# Patient Record
Sex: Male | Born: 1970 | Race: White | Hispanic: No | Marital: Married | Smoking: Current every day smoker
Health system: Southern US, Community
[De-identification: ages and names within clinical notes are randomized; demographics above are authoritative.]

## PROBLEM LIST (undated history)

## (undated) DIAGNOSIS — R51 Headache: Secondary | ICD-10-CM

## (undated) DIAGNOSIS — E785 Hyperlipidemia, unspecified: Secondary | ICD-10-CM

## (undated) DIAGNOSIS — N200 Calculus of kidney: Secondary | ICD-10-CM

## (undated) DIAGNOSIS — T8753 Necrosis of amputation stump, right lower extremity: Secondary | ICD-10-CM

## (undated) DIAGNOSIS — Z9289 Personal history of other medical treatment: Secondary | ICD-10-CM

## (undated) DIAGNOSIS — J449 Chronic obstructive pulmonary disease, unspecified: Secondary | ICD-10-CM

## (undated) DIAGNOSIS — G546 Phantom limb syndrome with pain: Secondary | ICD-10-CM

## (undated) DIAGNOSIS — E669 Obesity, unspecified: Secondary | ICD-10-CM

## (undated) DIAGNOSIS — G4730 Sleep apnea, unspecified: Secondary | ICD-10-CM

## (undated) DIAGNOSIS — G8929 Other chronic pain: Secondary | ICD-10-CM

## (undated) DIAGNOSIS — D649 Anemia, unspecified: Secondary | ICD-10-CM

## (undated) DIAGNOSIS — R030 Elevated blood-pressure reading, without diagnosis of hypertension: Secondary | ICD-10-CM

## (undated) DIAGNOSIS — T8859XA Other complications of anesthesia, initial encounter: Secondary | ICD-10-CM

## (undated) DIAGNOSIS — Z87448 Personal history of other diseases of urinary system: Secondary | ICD-10-CM

## (undated) DIAGNOSIS — Z8709 Personal history of other diseases of the respiratory system: Secondary | ICD-10-CM

## (undated) DIAGNOSIS — I739 Peripheral vascular disease, unspecified: Secondary | ICD-10-CM

## (undated) DIAGNOSIS — R600 Localized edema: Secondary | ICD-10-CM

## (undated) DIAGNOSIS — Z89519 Acquired absence of unspecified leg below knee: Secondary | ICD-10-CM

## (undated) DIAGNOSIS — Z7901 Long term (current) use of anticoagulants: Secondary | ICD-10-CM

## (undated) DIAGNOSIS — R519 Headache, unspecified: Secondary | ICD-10-CM

## (undated) DIAGNOSIS — M549 Dorsalgia, unspecified: Secondary | ICD-10-CM

## (undated) DIAGNOSIS — F419 Anxiety disorder, unspecified: Secondary | ICD-10-CM

## (undated) DIAGNOSIS — I2699 Other pulmonary embolism without acute cor pulmonale: Secondary | ICD-10-CM

## (undated) DIAGNOSIS — I10 Essential (primary) hypertension: Secondary | ICD-10-CM

## (undated) DIAGNOSIS — T4145XA Adverse effect of unspecified anesthetic, initial encounter: Secondary | ICD-10-CM

## (undated) HISTORY — DX: Obesity, unspecified: E66.9

## (undated) HISTORY — DX: Hyperlipidemia, unspecified: E78.5

## (undated) HISTORY — DX: Localized edema: R60.0

## (undated) HISTORY — PX: APPENDECTOMY: SHX54

## (undated) HISTORY — DX: Essential (primary) hypertension: I10

---

## 1999-01-06 ENCOUNTER — Ambulatory Visit (HOSPITAL_BASED_OUTPATIENT_CLINIC_OR_DEPARTMENT_OTHER): Admission: RE | Admit: 1999-01-06 | Discharge: 1999-01-06 | Payer: Self-pay | Admitting: Orthopedic Surgery

## 1999-01-06 ENCOUNTER — Encounter (INDEPENDENT_AMBULATORY_CARE_PROVIDER_SITE_OTHER): Payer: Self-pay | Admitting: *Deleted

## 2002-04-26 ENCOUNTER — Emergency Department (HOSPITAL_COMMUNITY): Admission: EM | Admit: 2002-04-26 | Discharge: 2002-04-26 | Payer: Self-pay | Admitting: Emergency Medicine

## 2002-04-29 ENCOUNTER — Inpatient Hospital Stay (HOSPITAL_COMMUNITY): Admission: EM | Admit: 2002-04-29 | Discharge: 2002-05-01 | Payer: Self-pay

## 2003-03-14 ENCOUNTER — Emergency Department (HOSPITAL_COMMUNITY): Admission: EM | Admit: 2003-03-14 | Discharge: 2003-03-14 | Payer: Self-pay | Admitting: Emergency Medicine

## 2004-09-29 ENCOUNTER — Emergency Department (HOSPITAL_COMMUNITY): Admission: EM | Admit: 2004-09-29 | Discharge: 2004-09-29 | Payer: Self-pay | Admitting: Emergency Medicine

## 2005-03-16 ENCOUNTER — Ambulatory Visit (HOSPITAL_COMMUNITY): Admission: RE | Admit: 2005-03-16 | Discharge: 2005-03-16 | Payer: Self-pay | Admitting: Internal Medicine

## 2005-06-26 ENCOUNTER — Emergency Department (HOSPITAL_COMMUNITY): Admission: EM | Admit: 2005-06-26 | Discharge: 2005-06-26 | Payer: Self-pay | Admitting: Family Medicine

## 2007-04-03 ENCOUNTER — Ambulatory Visit (HOSPITAL_COMMUNITY): Admission: RE | Admit: 2007-04-03 | Discharge: 2007-04-03 | Payer: Self-pay | Admitting: Internal Medicine

## 2009-05-23 ENCOUNTER — Ambulatory Visit (HOSPITAL_COMMUNITY): Admission: RE | Admit: 2009-05-23 | Discharge: 2009-05-23 | Payer: Self-pay | Admitting: Internal Medicine

## 2010-02-20 ENCOUNTER — Ambulatory Visit
Admission: RE | Admit: 2010-02-20 | Discharge: 2010-02-20 | Payer: Self-pay | Source: Home / Self Care | Attending: Family Medicine | Admitting: Family Medicine

## 2010-06-23 NOTE — Discharge Summary (Signed)
   Dean Bowers, Dean Bowers                          ACCOUNT NO.:  [id number]   MEDICAL RECORD NO.:  [id number]                   PATIENT TYPE:  INP   LOCATION:  0373                                 FACILITY:  [facility]   PHYSICIAN:  Ermalene Searing. Leander Rams, M.D.                DATE OF BIRTH:  06/17/1970   DATE OF ADMISSION:  04/29/2002  DATE OF DISCHARGE:  05/01/2002                                 DISCHARGE SUMMARY   DISCHARGE DIAGNOSES:  1. Infected sebaceous cyst.     a. Status post incision and drainage (04/30/2002).   PAST MEDICAL HISTORY:  None.   PRESENTATION:  The patient is a 40 year old white male previously healthy  who presents with a right posterior chest redness, swelling.  Several days  prior to admission, the patient noticed a small pimple which progressed the  past few days which got bigger and more swollen.  The patient visited the ED  a few days prior to admission, was given Keflex, sent home.  However, new  pustules started to develop and the patient came back to ED.   HOSPITAL COURSE:  INFECTED SEBACEOUS CYST.  On admission, the patient was  noted to have a right posterior chest 10 x 7 cm red, swollen, tender lesion  with four pustules in linear fashion of which three were intact and one was  ruptured.  The impression was cellulitis versus abscess.  Ultrasound was  done and a small amount of pus was aspirated but it was not adequate.  The  next day, a surgery consult was obtained for incision and drainage.  During  the procedure, two sebaceous cysts of golf ball size were found.  These were  removed and the lesions were packed and left open.   CONDITION AT DISCHARGE:  Improved.   DISPOSITION:  To home.   MEDICATION AT DISCHARGE:  Keflex 500 mg p.o. three times daily for 5 days.   WOUND CARE:  The patient was given instruction to change right back dressing  two to three times a day, soak the wound in bathtub and clean with soap and  water and pack it with a saline  gauze.                                               Ermalene Searing. Leander Rams, M.D.    MSR/MEDQ  D:  05/01/2002  T:  05/01/2002  Job:  [id number]   cc:   [facility]

## 2010-06-23 NOTE — Op Note (Signed)
   Dean Bowers, Dean Bowers                          ACCOUNT NO.:  [id number]   MEDICAL RECORD NO.:  [id number]                   PATIENT TYPE:  INP   LOCATION:  0373                                 FACILITY:  [facility]   PHYSICIAN:  Sandria Bales. Ezzard Standing, M.D.               DATE OF BIRTH:  13-Aug-1970   DATE OF PROCEDURE:  04/30/2002  DATE OF DISCHARGE:                                 OPERATIVE REPORT   PREOPERATIVE DIAGNOSIS:  Infection of right back.   POSTOPERATIVE DIAGNOSIS:  Two adjacent infected sebaceous cysts of the right  back.   PROCEDURE:  Incision and drainage of cysts at the bedside.   SURGEON:  Sandria Bales. Ezzard Standing, M.D.   ANESTHESIA:  20 cc 1% Xylocaine.   COMPLICATIONS:  None.   INDICATIONS FOR PROCEDURE:  The patient is a 40 year old obese, tattooed  male, who comes in to see Dr. Leonides Sake at the [facility].  He  comes in with an infection of his right back and is admitted by Dr. Halford Chessman.  Dr. Leander Rams asked me to see him for evaluation of this infection, and I  thought it needed to be incised and drained.   DESCRIPTION OF PROCEDURE:  The patient was in his bed on the Floor.  I  painted the area with Betadine solution.  Infiltrated about  20 cc of 1% Xylocaine.  I made a linear incision basically connecting the  dots where the punctums had come out, but what I broke into were two  sebaceous cysts.  One was probably 3-4 cm in diameter that was more medial  and posterior, and there was about a 2 cm sebaceous cyst more lateral.  I  got sebacea out of both of these, and I think this is probably the origin of  these infections.   I packed it with gauze and dressed it with 4x4's.   DISPOSITION:  The patient will be kept overnight and then plan to discharge  tomorrow.  Will be discharged on oral antibiotics with plans for dressing  changes.  He is to see [place] back in the office in 5-7 days.                                               Sandria Bales. Ezzard Standing, M.D.    DHN/MEDQ  D:  04/30/2002  T:  05/01/2002  Job:  [id number]   cc:   Ermalene Searing. Leander Rams, M.D.  [address], [place] [address]  Fax: [phone]

## 2010-06-23 NOTE — Discharge Summary (Signed)
   Dean Bowers, Dean Bowers                          ACCOUNT NO.:  [id number]   MEDICAL RECORD NO.:  [id number]                   PATIENT TYPE:  INP   LOCATION:  0373                                 FACILITY:  [facility]   PHYSICIAN:  Ermalene Searing. Leander Rams, M.D.                DATE OF BIRTH:  12/21/1970   DATE OF ADMISSION:  04/29/2002  DATE OF DISCHARGE:  05/01/2002                                 DISCHARGE SUMMARY   ADDENDUM:  Arrangements for follow-up care:  The patient will be followed up  by Dr. Ezzard Standing in about a week.                                               Ermalene Searing. Leander Rams, M.D.    MSR/MEDQ  D:  05/01/2002  T:  05/01/2002  Job:  [id number]

## 2010-06-23 NOTE — Consult Note (Signed)
NAMEANSELMO, REIHL                          ACCOUNT NO.:  [id number]   MEDICAL RECORD NO.:  [id number]                   PATIENT TYPE:  INP   LOCATION:  0373                                 FACILITY:  [facility]   PHYSICIAN:  Sandria Bales. Ezzard Standing, M.D.               DATE OF BIRTH:  1970/05/11   DATE OF CONSULTATION:  04/30/2002  DATE OF DISCHARGE:                                   CONSULTATION   HISTORY OF PRESENT ILLNESS:  Mr. Clipper is a 40 year old white male who  comes with an infection of his right back admitted by Dr. Halford Chessman on the  24th who asked me to see him for evaluation of this infection of his back  and possible drainage.   The patient has had skin trouble since a teenager but he is obese, his  weight is about 290 to 295 pounds, he also smokes cigarettes and these are  probably contributing factors. He said he had some swelling of this area  before it got infected.   PAST MEDICAL HISTORY:  He has no allergies and his only medications are  albuterol nebs he takes as needed. He has had only an appendectomy as a  prior operation.   REVIEW OF SYMPTOMS:  PULMONARY:  Smokes cigarettes. He knows this is bad for  his health. He has actually talked about trying to quit. He also has some  mild asthma. CARDIAC:  No history of heart disease, hypertension, chest  pain. GASTROINTESTINAL:  No history of peptic ulcer disease or liver  disease. UROLOGIC:  No history of kidney stones or kidney infections. SKIN:  He is multiply tattooed. He also has piercings of his right nipple, ears,  tongue.   PHYSICAL EXAMINATION:  VITAL SIGNS:  Temperature of 97.2, pulse 91, blood  pressure 134/74.J  GENERAL:  He is a well-nourished, obese male multiply tattooed.  SKIN:  Examination limited to his skin shows an area of inflammation of his  right back where he had some draining sinuses with surrounding cellulitis.  The draining sinuses cover a length of about 6-7 cm in length. The  surrounding  cellulitis is probably 8 or 9 cm in length and about 5 cm in  width.   LABORATORY DATA:  His white blood count is 8700. His hemoglobin 14,  hematocrit 41. __________ 132, potassium 3.6, chloride 102, CO2 28, glucose  120, BUN 11, creatinine of 0.8.   IMPRESSION:  1. Infection, cellulitis, abscess of right back and need for incision and     drainage and will plan to do this later this     evening. I discussed this with the patient.  2. Obesity. He knows his weight is a problem.  3. He has been smoking cigarettes with asthma, he knows he needs to quit     smoking.  Sandria Bales. Ezzard Standing, M.D.    DHN/MEDQ  D:  04/30/2002  T:  04/30/2002  Job:  [id number]   cc:   Ermalene Searing. Leander Rams, M.D.  [address] [place] [address]  Fax: [phone]   Holley Bouche, M.D.  [address]  [place], [place] [address]  Fax: [phone]

## 2010-08-28 ENCOUNTER — Encounter: Payer: Self-pay | Admitting: Medical

## 2010-08-28 ENCOUNTER — Ambulatory Visit (INDEPENDENT_AMBULATORY_CARE_PROVIDER_SITE_OTHER): Payer: PRIVATE HEALTH INSURANCE | Admitting: Medical

## 2010-08-28 VITALS — BP 118/72 | HR 68 | Temp 98.0°F | Ht 70.0 in | Wt 287.0 lb

## 2010-08-28 DIAGNOSIS — H6690 Otitis media, unspecified, unspecified ear: Secondary | ICD-10-CM

## 2010-08-28 DIAGNOSIS — F17200 Nicotine dependence, unspecified, uncomplicated: Secondary | ICD-10-CM

## 2010-08-28 DIAGNOSIS — H60399 Other infective otitis externa, unspecified ear: Secondary | ICD-10-CM

## 2010-08-28 DIAGNOSIS — H6090 Unspecified otitis externa, unspecified ear: Secondary | ICD-10-CM

## 2010-08-28 MED ORDER — NEOMYCIN-POLYMYXIN-HC 1 % OT SOLN: 4.0000 [drp] | Freq: Four times a day (QID) | OTIC | Status: AC

## 2010-08-28 MED ORDER — AMOXICILLIN 875 MG PO TABS: 875.0000 mg | ORAL_TABLET | Freq: Two times a day (BID) | ORAL | Status: AC

## 2010-08-28 NOTE — Progress Notes (Signed)
Subjective:     Dean Bowers is a 40 y.o. male who presents with ear pain and possible ear infection. Symptoms include: right ear drainage , right ear pain and cough, dizziness, pressure in ears.  Onset of symptoms was 4 days ago, and have been gradually worsening since that time. Patient denies: achiness, chills, fever , headache, sinus pressure and sore throat. He is drinking plenty of fluids.  Treatment to date: peroxide in ears.  Denies sick contacts.  No other aggravating or relieving factors.  No other c/o.  The following portions of the patient's history were reviewed and updated as appropriate: allergies, current medications, past family history, past medical history, past social history, past surgical history and problem list.  Past Medical History  Diagnosis Date  . Hypertension   . Dyslipidemia   . Obesity     Review of Systems Constitutional: denies fever, chills, sweats, anorexia Skin: denies rash HEENT: +ear pain, denies hearing loss, sore throat, itchy watery eyes, sneezing Cardiovascular: denies chest pain Lungs: +chest congestion, cough; denies wheezing, SOB Abdomen: denies abdominal pain, nausea, vomiting, diarrhea GU: denies dysuria  Objective:   Filed Vitals:   08/28/10 1421  BP: 118/72  Pulse: 68  Temp: 98 F (36.7 C)    General appearance: Alert, WD/WN, no distress, mildly ill appearing, overweight, white male, strong tobacco odor                             Skin: warm, no rash                           Head: no sinus tenderness                            Eyes: conjunctiva normal, corneas clear, PERRLA                            Ears: left TM erythema and retraction, right ear canal swollen and with some purulent debris and erythema                          Nose: septum midline, turbinates swollen, with erythema and clear discharge             Mouth/throat: MMM, tongue normal, mild pharyngeal erythema                           Neck: supple, no  adenopathy, no thyromegaly, non tender                          Heart: RRR, normal S1, S2, no murmurs                         Lungs: diffuse bronchial sounds, scattered rhonchi, no rales or wheezes    Assessment:   Encounter Diagnoses  Name Primary?  . Otitis externa Yes  . Otitis media   . Tobacco use disorder      Plan:  Otitis externa- cortisporin otic sent  Otitis media - amoxicillin sent, use some OTC Mucinex DM for congestion in ears and chest.  Hydrate well. Call/return if worse or not improving.  Tobacco use disorder- discussed risks of tobacco use.  C/t efforts to stop.  He was intolerant to Chantix.  Wants to c/t efforts on his own without medication.

## 2011-01-04 ENCOUNTER — Ambulatory Visit (INDEPENDENT_AMBULATORY_CARE_PROVIDER_SITE_OTHER): Payer: PRIVATE HEALTH INSURANCE | Admitting: Medical

## 2011-01-04 ENCOUNTER — Encounter: Payer: Self-pay | Admitting: Family Medicine

## 2011-01-04 ENCOUNTER — Encounter: Payer: Self-pay | Admitting: Medical

## 2011-01-04 ENCOUNTER — Ambulatory Visit
Admission: RE | Admit: 2011-01-04 | Discharge: 2011-01-04 | Disposition: A | Discharge status: Home / Self Care | Payer: PRIVATE HEALTH INSURANCE | Source: Ambulatory Visit | Attending: Medical | Admitting: Medical

## 2011-01-04 VITALS — BP 120/80 | HR 72 | Temp 97.9°F | Resp 18 | Wt 288.0 lb

## 2011-01-04 DIAGNOSIS — F17200 Nicotine dependence, unspecified, uncomplicated: Secondary | ICD-10-CM

## 2011-01-04 DIAGNOSIS — S8001XA Contusion of right knee, initial encounter: Secondary | ICD-10-CM

## 2011-01-04 DIAGNOSIS — R062 Wheezing: Secondary | ICD-10-CM

## 2011-01-04 DIAGNOSIS — S299XXA Unspecified injury of thorax, initial encounter: Secondary | ICD-10-CM

## 2011-01-04 DIAGNOSIS — S8000XA Contusion of unspecified knee, initial encounter: Secondary | ICD-10-CM

## 2011-01-04 DIAGNOSIS — S20219A Contusion of unspecified front wall of thorax, initial encounter: Secondary | ICD-10-CM

## 2011-01-04 DIAGNOSIS — S298XXA Other specified injuries of thorax, initial encounter: Secondary | ICD-10-CM

## 2011-01-04 MED ORDER — OXYCODONE-ACETAMINOPHEN 5-325 MG PO TABS: 1.0000 | ORAL_TABLET | Freq: Four times a day (QID) | ORAL | Status: DC | PRN

## 2011-01-04 NOTE — Patient Instructions (Signed)
Rib Contusion A rib contusion (bruise) can occur by a blow to the chest or by a fall against a hard object. Usually these will be much better in a couple weeks. If X-rays were taken today and there are no broken bones (fractures), the diagnosis of bruising is made. However, broken ribs may not show up for several days, or may be discovered later on a routine X-ray when signs of healing show up. If this happens to you, it does not mean that something was missed on the X-ray, but simply that it did not show up on the first X-rays. Earlier diagnosis will not usually change the treatment. HOME CARE INSTRUCTIONS   Avoid strenuous activity. Be careful during activities and avoid bumping the injured ribs. Activities that pull on the injured ribs and cause pain should be avoided, if possible.   For the first day or two, an ice pack used every 20 minutes while awake may be helpful. Put ice in a plastic bag and put a towel between the bag and the skin.   Eat a normal, well-balanced diet. Drink plenty of fluids to avoid constipation.   Take deep breaths several times a day to keep lungs free of infection. Try to cough several times a day. Splint the injured area with a pillow while coughing to ease pain. Coughing can help prevent pneumonia.   Wear a rib belt or binder only if told to do so by your caregiver. If you are wearing a rib belt or binder, you must do the breathing exercises as directed by your caregiver. If not used properly, rib belts or binders restrict breathing which can lead to pneumonia.   Only take over-the-counter or prescription medicines for pain, discomfort, or fever as directed by your caregiver.  SEEK MEDICAL CARE IF:   You or your child has an oral temperature above 102 F (38.9 C).   Your baby is older than 3 months with a rectal temperature of 100.5 F (38.1 C) or higher for more than 1 day.   You develop a cough, with thick or bloody sputum.  SEEK IMMEDIATE MEDICAL CARE IF:     You have difficulty breathing.   You feel sick to your stomach (nausea), have vomiting or belly (abdominal) pain.   You have worsening pain, not controlled with medications, or there is a change in the location of the pain.   You develop sweating or radiation of the pain into the arms, jaw or shoulders, or become light headed or faint.   You or your child has an oral temperature above 102 F (38.9 C), not controlled by medicine.   Your or your baby is older than 3 months with a rectal temperature of 102 F (38.9 C) or higher.   Your baby is 53 months old or younger with a rectal temperature of 100.4 F (38 C) or higher.  MAKE SURE YOU:   Understand these instructions.   Will watch your condition.   Will get help right away if you are not doing well or get worse.  Document Released: 10/17/2000 Document Revised: 10/04/2010 Document Reviewed: 09/10/2007 [facility] Patient Information 2012 [place], [place].
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
## 2011-01-11 ENCOUNTER — Telehealth: Payer: Self-pay | Admitting: Medical

## 2011-01-11 NOTE — Telephone Encounter (Signed)
CALLED PT.LM WITH DR KNAPP'S INSTRUCTIONS. ADVISED TO CALL WITH QUESTIONS.

## 2011-01-11 NOTE — Telephone Encounter (Signed)
His rib pain should be improving by this point, and not needing narcotic pain meds.  I recommend that he be taking anti-inflammatories such as Motrin or Aleve. If he has tried these without relief, he can take the equivalent of prescription strength (as long as he doesn't have history of stomach ulcers or kidney problems), which is 2 Aleve twice a day, taken with food, OR ibuprofen 800 mg (4 of the 200 mg OTC tablets) three times daily with food.  He can also try moist heat.  If this isn't effective in providing pain control, then run it by Vincenza Hews tomorrow to see if he will approve this refill, versus wanting to see him in follow-up.  Thanks

## 2011-01-12 ENCOUNTER — Telehealth: Payer: Self-pay | Admitting: Family Medicine

## 2011-01-12 ENCOUNTER — Other Ambulatory Visit: Payer: Self-pay | Admitting: Medical

## 2011-01-12 MED ORDER — TRAMADOL HCL 50 MG PO TABS: 50.0000 mg | ORAL_TABLET | Freq: Four times a day (QID) | ORAL | Status: DC | PRN

## 2011-01-12 NOTE — Telephone Encounter (Signed)
Patient was notified that his medication was called out to the pharmacy. cls

## 2011-01-12 NOTE — Telephone Encounter (Signed)
Different pain medication sent to the pharmacy, and he should c/t OTC Aleve 1-2 BID pain.  I sent Ultram so no need to call this one out.

## 2011-02-26 ENCOUNTER — Encounter: Payer: Self-pay | Admitting: Family Medicine

## 2011-02-26 ENCOUNTER — Ambulatory Visit (INDEPENDENT_AMBULATORY_CARE_PROVIDER_SITE_OTHER): Payer: PRIVATE HEALTH INSURANCE | Admitting: Family Medicine

## 2011-02-26 VITALS — BP 140/90 | HR 67 | Ht 70.0 in | Wt 296.0 lb

## 2011-02-26 DIAGNOSIS — E1169 Type 2 diabetes mellitus with other specified complication: Secondary | ICD-10-CM | POA: Insufficient documentation

## 2011-02-26 DIAGNOSIS — E7800 Pure hypercholesterolemia, unspecified: Secondary | ICD-10-CM

## 2011-02-26 DIAGNOSIS — E785 Hyperlipidemia, unspecified: Secondary | ICD-10-CM | POA: Insufficient documentation

## 2011-02-26 DIAGNOSIS — F17200 Nicotine dependence, unspecified, uncomplicated: Secondary | ICD-10-CM

## 2011-02-26 DIAGNOSIS — Z0000 Encounter for general adult medical examination without abnormal findings: Secondary | ICD-10-CM

## 2011-02-26 LAB — LIPID PANEL
Cholesterol: 201 mg/dL — ABNORMAL HIGH (ref 0–200)
LDL Cholesterol: 134 mg/dL — ABNORMAL HIGH (ref 0–99)
Total CHOL/HDL Ratio: 4.3 Ratio
VLDL: 20 mg/dL (ref 0–40)

## 2011-02-26 MED ORDER — PRAVASTATIN SODIUM 40 MG PO TABS: 40.0000 mg | ORAL_TABLET | Freq: Every day | ORAL | Status: DC

## 2011-02-26 MED ORDER — ATENOLOL 25 MG PO TABS: 25.0000 mg | ORAL_TABLET | Freq: Every day | ORAL | Status: DC

## 2011-02-26 NOTE — Patient Instructions (Signed)
Continue with her exercise. Let's see what the nutritionist has to say to help you.

## 2011-02-26 NOTE — Progress Notes (Signed)
  Subjective:    Patient ID: Dean Bowers, male    DOB: 01-29-1971, 41 y.o.   MRN: [id number]  HPI He is here for a complete examination. Does continue smoke is not ready to quit. He is exercising regularly. He did lose weight from over 300 to now under 300 pounds but seems to be stable. He recently ran out of his statin medication and did not get it refilled. He does complain of generalized aches and pains mainly in his hips knees and back.   Review of Systems  Constitutional: Negative.   HENT: Negative.   Eyes: Negative.   Respiratory: Negative.   Cardiovascular: Negative.   Gastrointestinal: Negative.   Genitourinary: Negative.   Musculoskeletal: Positive for arthralgias.  Skin: Negative.   Neurological: Negative.   Hematological: Negative.        Objective:   Physical Exam BP 140/90  Pulse 67  Ht 5\' 10"  (1.778 m)  Wt 296 lb (134.265 kg)  BMI 42.47 kg/m2  SpO2 98%  General Appearance:    Alert, cooperative, no distress, appears stated age  Head:    Normocephalic, without obvious abnormality, atraumatic  Eyes:    PERRL, conjunctiva/corneas clear, EOM's intact, fundi    benign  Ears:    Normal TM's and external ear canals  Nose:   Nares normal, mucosa normal, no drainage or sinus   tenderness  Throat:   Lips, mucosa, and tongue normal; teeth and gums normal  Neck:   Supple, no lymphadenopathy;  thyroid:  no   enlargement/tenderness/nodules; no carotid   bruit or JVD  Back:    Spine nontender, no curvature, ROM normal, no CVA     tenderness  Lungs:     Clear to auscultation bilaterally without wheezes, rales or     ronchi; respirations unlabored  Chest Wall:    No tenderness or deformity   Heart:    Regular rate and rhythm, S1 and S2 normal, no murmur, rub   or gallop  Breast Exam:    No chest wall tenderness, masses or gynecomastia  Abdomen:     Soft, non-tender, nondistended, normoactive bowel sounds,    no masses, no hepatosplenomegaly  Genitalia:    Normal male  external genitalia without lesions.  Testicles without masses.  No inguinal hernias.  Rectal:    Normal sphincter tone, no masses or tenderness; guaiac negative stool.  Prostate smooth, no nodules, not enlarged.  Extremities:   No clubbing, cyanosis or edema  Pulses:   2+ and symmetric all extremities  Skin:   Skin color, texture, turgor normal, no rashes or lesions  Lymph nodes:   Cervical, supraclavicular, and axillary nodes normal  Neurologic:   CNII-XII intact, normal strength, sensation and gait; reflexes 2+ and symmetric throughout          Psych:   Normal mood, affect, hygiene and grooming.           Assessment & Plan:   1. Obesity, Class III, BMI 40-49.9 (morbid obesity)  Amb ref to Medical Nutrition Therapy-MNT  2. Hypercholesterolemia  Lipid panel  3. Current smoker    4. Routine general medical examination at a health care facility  POCT Hemoccult (POC) Blood/Stool Test   discussed diet and exercise with him in detail. Explained that he has a lifestyle issue that needs to be dealt with to help prevent further medical concerns. I will refer him to nutritionist. Discussed smoking cessation and told him that when he is ready, we'll help him.

## 2011-03-12 ENCOUNTER — Telehealth: Payer: Self-pay | Admitting: *Deleted

## 2011-03-12 NOTE — Telephone Encounter (Signed)
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

## 2011-03-12 NOTE — Telephone Encounter (Signed)
Patient's wife call and stated that her husband, Dean Bowers had an OV with you 02/26/11 and has lipid done as well. He had been off of his simvastatin(as you stated in your note) for roughly 2.5-3 weeks and his results were: CHOL:201 TRIG:102 HDL:47 LDL:102 He was told lipids were good, do you want him to continue on simvastatin or stop?

## 2011-03-12 NOTE — Telephone Encounter (Signed)
Informed pt wife  to go back on statin

## 2011-05-05 ENCOUNTER — Emergency Department (HOSPITAL_COMMUNITY)
Admission: EM | Admit: 2011-05-05 | Discharge: 2011-05-05 | Disposition: A | Discharge status: Home / Self Care | Payer: PRIVATE HEALTH INSURANCE | Source: Home / Self Care | Attending: Family Medicine | Admitting: Family Medicine

## 2011-05-05 ENCOUNTER — Encounter (HOSPITAL_COMMUNITY): Payer: Self-pay | Admitting: Emergency Medicine

## 2011-05-05 DIAGNOSIS — S61218A Laceration without foreign body of other finger without damage to nail, initial encounter: Secondary | ICD-10-CM

## 2011-05-05 DIAGNOSIS — Z23 Encounter for immunization: Secondary | ICD-10-CM

## 2011-05-05 DIAGNOSIS — S61209A Unspecified open wound of unspecified finger without damage to nail, initial encounter: Secondary | ICD-10-CM

## 2011-05-05 MED ORDER — TETANUS-DIPHTH-ACELL PERTUSSIS 5-2.5-18.5 LF-MCG/0.5 IM SUSP
INTRAMUSCULAR | Status: AC
  Filled 2011-05-05: qty 0.5

## 2011-05-05 MED ORDER — TETANUS-DIPHTH-ACELL PERTUSSIS 5-2.5-18.5 LF-MCG/0.5 IM SUSP
0.5000 mL | Freq: Once | INTRAMUSCULAR | Status: AC
  Administered 2011-05-05: 0.5 mL via INTRAMUSCULAR

## 2011-05-05 NOTE — ED Provider Notes (Signed)
History     CSN: [id number]  Arrival date & time 05/05/11  1412   First MD Initiated Contact with Patient 05/05/11 1453      Chief Complaint  Patient presents with  . Laceration    (Consider location/radiation/quality/duration/timing/severity/associated sxs/prior treatment) Patient is a 41 y.o. male presenting with skin laceration. The history is provided by the patient and the spouse.  Laceration  The incident occurred 6 to 12 hours ago. The laceration is located on the left hand. The laceration is 1 cm in size. Injury mechanism: cut with chain saw. The patient is experiencing no pain. Possible foreign bodies include wood. His tetanus status is out of date.    Past Medical History  Diagnosis Date  . Hypertension   . Obesity   . Dyslipidemia     Past Surgical History  Procedure Date  . Appendectomy     Family History  Problem Relation Age of Onset  . Diabetes Mother   . Arthritis Mother   . Heart disease Father   . Hypertension Father     History  Substance Use Topics  . Smoking status: Current Everyday Smoker -- 1.0 packs/day    Types: Cigarettes  . Smokeless tobacco: Never Used  . Alcohol Use: Yes     15 shots a month      Review of Systems  Constitutional: Negative.   Musculoskeletal: Negative.     Allergies  Review of patient's allergies indicates no known allergies.  Home Medications   Current Outpatient Rx  Name Route Sig Dispense Refill  . ATENOLOL 25 MG PO TABS Oral Take 1 tablet (25 mg total) by mouth daily. 30 tablet 11  . OMEGA-3 FATTY ACIDS 1000 MG PO CAPS Oral Take 2 g by mouth daily.      Marland Kitchen ONE-DAILY MULTI VITAMINS PO TABS Oral Take 1 tablet by mouth daily.      Marland Kitchen PRAVASTATIN SODIUM 40 MG PO TABS Oral Take 1 tablet (40 mg total) by mouth daily. 30 tablet 5    BP 140/81  Pulse 90  Temp(Src) 97.1 F (36.2 C) (Oral)  Resp 20  SpO2 95%  Physical Exam  Nursing note and vitals reviewed. Constitutional: He appears well-developed and  well-nourished.  Skin:       1.5 cm superficial lac over middle phalanx of lif, nl nvt fxn, no bleeding, no tendon exposed, superficial abrasion over dorsum of lmf    ED Course  Procedures (including critical care time)  Labs Reviewed - No data to display No results found.   1. Laceration of finger, index       MDM  Copious irrigation in tap water, betadine soak, xeroform dsd        Linna Hoff, MD 05/05/11 [phone]

## 2011-05-05 NOTE — Discharge Instructions (Signed)
Keep clean and dry and bandaged until Monday, then neosporin ointment twice a day, then wash and bandage as needed. Return if any problems. You had a tetanus booster.

## 2011-05-05 NOTE — ED Notes (Signed)
Patient has lacerations to left index and middle finger.  Currently bleeding controlled.  Patient cut fingers with chain saw this morning

## 2011-05-05 NOTE — ED Notes (Signed)
Patient has been washing hand at sink. Now soaking fingers in betadine and saline.

## 2011-05-05 NOTE — ED Notes (Signed)
Patient not sure when his last tetanus was received

## 2011-06-16 ENCOUNTER — Other Ambulatory Visit: Payer: Self-pay | Admitting: Family Medicine

## 2012-02-09 ENCOUNTER — Other Ambulatory Visit: Payer: Self-pay | Admitting: Family Medicine

## 2012-02-11 ENCOUNTER — Other Ambulatory Visit: Payer: Self-pay

## 2012-05-06 ENCOUNTER — Other Ambulatory Visit: Payer: Self-pay

## 2012-05-06 MED ORDER — PRAVASTATIN SODIUM 40 MG PO TABS: ORAL_TABLET | ORAL | Status: DC

## 2012-05-06 NOTE — Telephone Encounter (Signed)
SENT IN MED  

## 2012-05-13 ENCOUNTER — Telehealth: Payer: Self-pay | Admitting: Internal Medicine

## 2012-05-13 MED ORDER — ATENOLOL 50 MG PO TABS: ORAL_TABLET | ORAL | Status: DC

## 2012-05-13 NOTE — Telephone Encounter (Signed)
Sent in med

## 2012-05-25 ENCOUNTER — Encounter (HOSPITAL_COMMUNITY)
Admission: EM | Disposition: A | Discharge status: Other Acute Inpatient Hospital | Payer: Self-pay | Source: Home / Self Care

## 2012-05-25 ENCOUNTER — Emergency Department (HOSPITAL_COMMUNITY): Payer: 59

## 2012-05-25 ENCOUNTER — Encounter (HOSPITAL_COMMUNITY): Payer: Self-pay

## 2012-05-25 ENCOUNTER — Inpatient Hospital Stay (HOSPITAL_COMMUNITY): Payer: 59

## 2012-05-25 ENCOUNTER — Encounter (HOSPITAL_COMMUNITY): Payer: Self-pay | Admitting: Anesthesiology

## 2012-05-25 ENCOUNTER — Inpatient Hospital Stay (HOSPITAL_COMMUNITY): Payer: 59 | Admitting: Anesthesiology

## 2012-05-25 ENCOUNTER — Inpatient Hospital Stay (HOSPITAL_COMMUNITY)
Admission: EM | Admit: 2012-05-25 | Discharge: 2012-05-30 | DRG: 475 | Disposition: A | Discharge status: Other Acute Inpatient Hospital | Payer: 59 | Attending: General Surgery | Admitting: General Surgery

## 2012-05-25 DIAGNOSIS — G547 Phantom limb syndrome without pain: Secondary | ICD-10-CM | POA: Diagnosis not present

## 2012-05-25 DIAGNOSIS — R339 Retention of urine, unspecified: Secondary | ICD-10-CM | POA: Clinically undetermined

## 2012-05-25 DIAGNOSIS — S8990XA Unspecified injury of unspecified lower leg, initial encounter: Secondary | ICD-10-CM | POA: Diagnosis present

## 2012-05-25 DIAGNOSIS — F17200 Nicotine dependence, unspecified, uncomplicated: Secondary | ICD-10-CM | POA: Diagnosis present

## 2012-05-25 DIAGNOSIS — Z6841 Body Mass Index (BMI) 40.0 and over, adult: Secondary | ICD-10-CM

## 2012-05-25 DIAGNOSIS — E669 Obesity, unspecified: Secondary | ICD-10-CM | POA: Diagnosis present

## 2012-05-25 DIAGNOSIS — S7291XA Unspecified fracture of right femur, initial encounter for closed fracture: Secondary | ICD-10-CM

## 2012-05-25 DIAGNOSIS — J45909 Unspecified asthma, uncomplicated: Secondary | ICD-10-CM | POA: Diagnosis present

## 2012-05-25 DIAGNOSIS — F1210 Cannabis abuse, uncomplicated: Secondary | ICD-10-CM | POA: Diagnosis present

## 2012-05-25 DIAGNOSIS — S22009A Unspecified fracture of unspecified thoracic vertebra, initial encounter for closed fracture: Secondary | ICD-10-CM | POA: Diagnosis present

## 2012-05-25 DIAGNOSIS — S52202A Unspecified fracture of shaft of left ulna, initial encounter for closed fracture: Secondary | ICD-10-CM

## 2012-05-25 DIAGNOSIS — S5290XA Unspecified fracture of unspecified forearm, initial encounter for closed fracture: Secondary | ICD-10-CM | POA: Diagnosis present

## 2012-05-25 DIAGNOSIS — S98911A Complete traumatic amputation of right foot, level unspecified, initial encounter: Secondary | ICD-10-CM

## 2012-05-25 DIAGNOSIS — F1010 Alcohol abuse, uncomplicated: Secondary | ICD-10-CM | POA: Diagnosis present

## 2012-05-25 DIAGNOSIS — S22089A Unspecified fracture of T11-T12 vertebra, initial encounter for closed fracture: Secondary | ICD-10-CM

## 2012-05-25 DIAGNOSIS — S98919A Complete traumatic amputation of unspecified foot, level unspecified, initial encounter: Secondary | ICD-10-CM | POA: Diagnosis present

## 2012-05-25 DIAGNOSIS — D62 Acute posthemorrhagic anemia: Secondary | ICD-10-CM | POA: Diagnosis present

## 2012-05-25 DIAGNOSIS — S72409A Unspecified fracture of lower end of unspecified femur, initial encounter for closed fracture: Principal | ICD-10-CM | POA: Diagnosis present

## 2012-05-25 DIAGNOSIS — S99919A Unspecified injury of unspecified ankle, initial encounter: Secondary | ICD-10-CM | POA: Diagnosis present

## 2012-05-25 DIAGNOSIS — S5292XA Unspecified fracture of left forearm, initial encounter for closed fracture: Secondary | ICD-10-CM

## 2012-05-25 HISTORY — PX: FEMUR IM NAIL: SHX1597

## 2012-05-25 HISTORY — PX: CAST APPLICATION: SHX380

## 2012-05-25 HISTORY — PX: AMPUTATION: SHX166

## 2012-05-25 LAB — COMPREHENSIVE METABOLIC PANEL
AST: 82 U/L — ABNORMAL HIGH (ref 0–37)
Albumin: 3.3 g/dL — ABNORMAL LOW (ref 3.5–5.2)
Alkaline Phosphatase: 52 U/L (ref 39–117)
BUN: 19 mg/dL (ref 6–23)
Potassium: 3.8 mEq/L (ref 3.5–5.1)
Total Protein: 7.1 g/dL (ref 6.0–8.3)

## 2012-05-25 LAB — RAPID URINE DRUG SCREEN, HOSP PERFORMED
Amphetamines: POSITIVE — AB
Barbiturates: NOT DETECTED
Benzodiazepines: NOT DETECTED

## 2012-05-25 LAB — CBC
HCT: 44.7 % (ref 39.0–52.0)
MCV: 84.3 fL (ref 78.0–100.0)
RBC: 5.3 MIL/uL (ref 4.22–5.81)
RDW: 13.3 % (ref 11.5–15.5)
WBC: 12.1 10*3/uL — ABNORMAL HIGH (ref 4.0–10.5)

## 2012-05-25 LAB — URINE MICROSCOPIC-ADD ON

## 2012-05-25 LAB — POCT I-STAT, CHEM 8
BUN: 20 mg/dL (ref 6–23)
Creatinine, Ser: 1.2 mg/dL (ref 0.50–1.35)
Potassium: 3.7 mEq/L (ref 3.5–5.1)
Sodium: 140 mEq/L (ref 135–145)

## 2012-05-25 LAB — URINALYSIS, ROUTINE W REFLEX MICROSCOPIC
Bilirubin Urine: NEGATIVE
Ketones, ur: NEGATIVE mg/dL
Nitrite: NEGATIVE
Protein, ur: 30 mg/dL — AB
Urobilinogen, UA: 0.2 mg/dL (ref 0.0–1.0)

## 2012-05-25 LAB — ETHANOL: Alcohol, Ethyl (B): 15 mg/dL — ABNORMAL HIGH (ref 0–11)

## 2012-05-25 SURGERY — AMPUTATION BELOW KNEE
Anesthesia: General | Site: Leg Upper | Laterality: Right | Wound class: Dirty or Infected

## 2012-05-25 MED ORDER — IOHEXOL 300 MG/ML  SOLN
100.0000 mL | Freq: Once | INTRAMUSCULAR | Status: AC | PRN
  Administered 2012-05-25: 100 mL via INTRAVENOUS

## 2012-05-25 MED ORDER — CEFAZOLIN SODIUM-DEXTROSE 2-3 GM-% IV SOLR
INTRAVENOUS | Status: DC | PRN
  Administered 2012-05-25: 2 g via INTRAVENOUS
  Administered 2012-05-27: 1 g via INTRAVENOUS
  Administered 2012-05-27 (×2): 2 g via INTRAVENOUS

## 2012-05-25 MED ORDER — ARTIFICIAL TEARS OP OINT
TOPICAL_OINTMENT | OPHTHALMIC | Status: DC | PRN
  Administered 2012-05-25: 1 via OPHTHALMIC

## 2012-05-25 MED ORDER — LIDOCAINE HCL (CARDIAC) 20 MG/ML IV SOLN
INTRAVENOUS | Status: DC | PRN
  Administered 2012-05-25 (×2): 100 mg via INTRAVENOUS

## 2012-05-25 MED ORDER — NICOTINE 21 MG/24HR TD PT24
21.0000 mg | MEDICATED_PATCH | Freq: Every day | TRANSDERMAL | Status: DC
  Filled 2012-05-25: qty 1

## 2012-05-25 MED ORDER — ALBUTEROL SULFATE HFA 108 (90 BASE) MCG/ACT IN AERS
INHALATION_SPRAY | RESPIRATORY_TRACT | Status: DC | PRN
  Administered 2012-05-25 – 2012-05-26 (×4): 2 via RESPIRATORY_TRACT

## 2012-05-25 MED ORDER — ONDANSETRON HCL 4 MG/2ML IJ SOLN
INTRAMUSCULAR | Status: DC | PRN
  Administered 2012-05-25: 4 mg via INTRAVENOUS

## 2012-05-25 MED ORDER — VECURONIUM BROMIDE 10 MG IV SOLR
INTRAVENOUS | Status: DC | PRN
  Administered 2012-05-25 (×2): 10 mg via INTRAVENOUS

## 2012-05-25 MED ORDER — MORPHINE SULFATE 4 MG/ML IJ SOLN
8.0000 mg | Freq: Once | INTRAMUSCULAR | Status: AC
  Administered 2012-05-25: 8 mg via INTRAVENOUS
  Filled 2012-05-25: qty 2

## 2012-05-25 MED ORDER — 0.9 % SODIUM CHLORIDE (POUR BTL) OPTIME
TOPICAL | Status: DC | PRN
  Administered 2012-05-25: 1000 mL

## 2012-05-25 MED ORDER — SODIUM CHLORIDE 0.9 % IV SOLN
10.0000 mg | INTRAVENOUS | Status: DC | PRN
  Administered 2012-05-25: 25 ug/min via INTRAVENOUS

## 2012-05-25 MED ORDER — MIDAZOLAM HCL 5 MG/5ML IJ SOLN
INTRAMUSCULAR | Status: DC | PRN
  Administered 2012-05-25: 4 mg via INTRAVENOUS
  Administered 2012-05-25: 2 mg via INTRAVENOUS

## 2012-05-25 MED ORDER — FENTANYL CITRATE 0.05 MG/ML IJ SOLN
INTRAMUSCULAR | Status: AC | PRN
  Administered 2012-05-25 (×3): 100 ug via INTRAVENOUS

## 2012-05-25 MED ORDER — LACTATED RINGERS IV SOLN
INTRAVENOUS | Status: DC | PRN
  Administered 2012-05-25 (×4): via INTRAVENOUS

## 2012-05-25 MED ORDER — ALBUMIN HUMAN 5 % IV SOLN
INTRAVENOUS | Status: DC | PRN
  Administered 2012-05-25 (×2): via INTRAVENOUS

## 2012-05-25 MED ORDER — FENTANYL CITRATE 0.05 MG/ML IJ SOLN
INTRAMUSCULAR | Status: AC
  Filled 2012-05-25: qty 2

## 2012-05-25 MED ORDER — SUCCINYLCHOLINE CHLORIDE 20 MG/ML IJ SOLN
INTRAMUSCULAR | Status: DC | PRN
  Administered 2012-05-25: 140 mg via INTRAVENOUS

## 2012-05-25 MED ORDER — PROPOFOL 10 MG/ML IV BOLUS
INTRAVENOUS | Status: DC | PRN
  Administered 2012-05-25: 200 mg via INTRAVENOUS
  Administered 2012-05-25: 50 mg via INTRAVENOUS

## 2012-05-25 MED ORDER — CEFAZOLIN SODIUM-DEXTROSE 2-3 GM-% IV SOLR
2.0000 g | Freq: Once | INTRAVENOUS | Status: AC
  Administered 2012-05-25: 2 g via INTRAVENOUS
  Filled 2012-05-25: qty 50

## 2012-05-25 MED ORDER — FENTANYL CITRATE 0.05 MG/ML IJ SOLN
INTRAMUSCULAR | Status: DC | PRN
  Administered 2012-05-25: 100 ug via INTRAVENOUS
  Administered 2012-05-25: 50 ug via INTRAVENOUS
  Administered 2012-05-25: 150 ug via INTRAVENOUS
  Administered 2012-05-25 (×2): 50 ug via INTRAVENOUS
  Administered 2012-05-25: 100 ug via INTRAVENOUS
  Administered 2012-05-25: 50 ug via INTRAVENOUS
  Administered 2012-05-25: 100 ug via INTRAVENOUS
  Administered 2012-05-25 – 2012-05-26 (×3): 50 ug via INTRAVENOUS

## 2012-05-25 MED ORDER — TETANUS-DIPHTH-ACELL PERTUSSIS 5-2.5-18.5 LF-MCG/0.5 IM SUSP
0.5000 mL | Freq: Once | INTRAMUSCULAR | Status: AC
  Administered 2012-05-25: 0.5 mL via INTRAMUSCULAR
  Filled 2012-05-25: qty 0.5

## 2012-05-25 SURGICAL SUPPLY — 79 items
BANDAGE ELASTIC 3 VELCRO ST LF (GAUZE/BANDAGES/DRESSINGS) ×2 IMPLANT
BANDAGE ELASTIC 4 VELCRO ST LF (GAUZE/BANDAGES/DRESSINGS) ×2 IMPLANT
BANDAGE ELASTIC 6 VELCRO ST LF (GAUZE/BANDAGES/DRESSINGS) ×3 IMPLANT
BANDAGE GAUZE ELAST BULKY 4 IN (GAUZE/BANDAGES/DRESSINGS) ×2 IMPLANT
BIT DRILL CALIBRATED 4.3MMX365 (DRILL) ×1 IMPLANT
BIT DRILL CROWE PNT TWST 4.5MM (DRILL) ×1 IMPLANT
BLADE SAW RECIP 87.9 MT (BLADE) ×2 IMPLANT
BLADE SAW SAG HD 89.5X25X.94 (BLADE) ×2 IMPLANT
BLADE SURG 10 STRL SS (BLADE) ×6 IMPLANT
BLADE SURG 15 STRL LF DISP TIS (BLADE) ×2 IMPLANT
BLADE SURG 15 STRL SS (BLADE) ×10
CANISTER WOUND CARE 500ML ATS (WOUND CARE) ×2 IMPLANT
CLOTH BEACON ORANGE TIMEOUT ST (SAFETY) ×5 IMPLANT
COVER SURGICAL LIGHT HANDLE (MISCELLANEOUS) ×5 IMPLANT
CUFF TOURNIQUET SINGLE 34IN LL (TOURNIQUET CUFF) ×2 IMPLANT
CUFF TOURNIQUET SINGLE 44IN (TOURNIQUET CUFF) IMPLANT
DRAPE C-ARM 42X72 X-RAY (DRAPES) ×2 IMPLANT
DRAPE C-ARMOR (DRAPES) ×2 IMPLANT
DRAPE EXTREMITY T 121X128X90 (DRAPE) ×3 IMPLANT
DRAPE INCISE IOBAN 66X45 STRL (DRAPES) ×2 IMPLANT
DRAPE ORTHO SPLIT 77X108 STRL (DRAPES) ×5
DRAPE PROXIMA HALF (DRAPES) ×4 IMPLANT
DRAPE SURG ORHT 6 SPLT 77X108 (DRAPES) ×1 IMPLANT
DRAPE U-SHAPE 47X51 STRL (DRAPES) ×2 IMPLANT
DRILL CALIBRATED 4.3MMX365 (DRILL) ×5
DRILL CROWE POINT TWIST 4.5MM (DRILL) ×5
DRSG MEPILEX BORDER 4X4 (GAUZE/BANDAGES/DRESSINGS) ×2 IMPLANT
DRSG PAD ABDOMINAL 8X10 ST (GAUZE/BANDAGES/DRESSINGS) ×2 IMPLANT
DRSG VAC ATS MED SENSATRAC (GAUZE/BANDAGES/DRESSINGS) ×2 IMPLANT
DURAPREP 26ML APPLICATOR (WOUND CARE) ×7 IMPLANT
GAUZE XEROFORM 5X9 LF (GAUZE/BANDAGES/DRESSINGS) ×7 IMPLANT
GLOVE BIOGEL PI IND STRL 7.0 (GLOVE) ×2 IMPLANT
GLOVE BIOGEL PI IND STRL 7.5 (GLOVE) ×5 IMPLANT
GLOVE BIOGEL PI IND STRL 8 (GLOVE) ×8 IMPLANT
GLOVE BIOGEL PI INDICATOR 7.0 (GLOVE) ×2
GLOVE BIOGEL PI INDICATOR 7.5 (GLOVE) ×2
GLOVE BIOGEL PI INDICATOR 8 (GLOVE) ×2
GLOVE ECLIPSE 8.0 STRL XLNG CF (GLOVE) ×2 IMPLANT
GLOVE ORTHO TXT STRL SZ7.5 (GLOVE) ×9 IMPLANT
GLOVE SURG ORTHO 8.0 STRL STRW (GLOVE) ×9 IMPLANT
GOWN STRL NON-REIN LRG LVL3 (GOWN DISPOSABLE) ×8 IMPLANT
GOWN STRL REIN 3XL LVL4 (GOWN DISPOSABLE) ×4 IMPLANT
GUIDEPIN 3.2X17.5 THRD DISP (PIN) ×2 IMPLANT
GUIDEWIRE BEAD TIP (WIRE) ×2 IMPLANT
IMMOBILIZER KNEE 20 (SOFTGOODS) ×5
IMMOBILIZER KNEE 20 THIGH 36 (SOFTGOODS) ×1 IMPLANT
KIT BASIN OR (CUSTOM PROCEDURE TRAY) ×5 IMPLANT
KIT ROOM TURNOVER OR (KITS) ×5 IMPLANT
MANIFOLD NEPTUNE II (INSTRUMENTS) ×5 IMPLANT
NAIL FEM RETRO 12X360 (Nail) ×2 IMPLANT
NS IRRIG 1000ML POUR BTL (IV SOLUTION) ×7 IMPLANT
PACK GENERAL/GYN (CUSTOM PROCEDURE TRAY) ×3 IMPLANT
PAD ARMBOARD 7.5X6 YLW CONV (MISCELLANEOUS) ×10 IMPLANT
PAD CAST 4YDX4 CTTN HI CHSV (CAST SUPPLIES) ×7 IMPLANT
PADDING CAST COTTON 4X4 STRL (CAST SUPPLIES) ×20
SCREW CORT TI DBL LEAD 5X32 (Screw) ×2 IMPLANT
SCREW CORT TI DBL LEAD 5X40 (Screw) ×2 IMPLANT
SCREW CORT TI DBL LEAD 5X65 (Screw) ×2 IMPLANT
SCREW CORT TI DBL LEAD 5X85 (Screw) ×2 IMPLANT
SOLUTION BETADINE 4OZ (MISCELLANEOUS) ×5 IMPLANT
SPLINT PLASTER CAST XFAST 4X15 (CAST SUPPLIES) ×1 IMPLANT
SPLINT PLASTER XTRA FAST SET 4 (CAST SUPPLIES) ×1
SPONGE GAUZE 4X4 12PLY (GAUZE/BANDAGES/DRESSINGS) ×5 IMPLANT
SPONGE LAP 18X18 X RAY DECT (DISPOSABLE) ×2 IMPLANT
SPONGE SCRUB IODOPHOR (GAUZE/BANDAGES/DRESSINGS) ×5 IMPLANT
STAPLER VISISTAT 35W (STAPLE) ×5 IMPLANT
STOCKINETTE IMPERVIOUS LG (DRAPES) ×2 IMPLANT
SUT ETHILON 2 0 PSLX (SUTURE) ×2 IMPLANT
SUT SILK 2 0 SH CR/8 (SUTURE) ×2 IMPLANT
SUT VIC AB 0 CTB1 27 (SUTURE) ×3 IMPLANT
SUT VIC AB 1 CT1 27 (SUTURE) ×10
SUT VIC AB 1 CT1 27XBRD ANBCTR (SUTURE) ×2 IMPLANT
SUT VIC AB 1 CTB1 27 (SUTURE) ×6 IMPLANT
SUT VIC AB 2-0 CTB1 (SUTURE) ×11 IMPLANT
SYR BULB IRRIGATION 50ML (SYRINGE) ×2 IMPLANT
TOWEL OR 17X24 6PK STRL BLUE (TOWEL DISPOSABLE) ×9 IMPLANT
TOWEL OR 17X26 10 PK STRL BLUE (TOWEL DISPOSABLE) ×11 IMPLANT
UNDERPAD 30X30 INCONTINENT (UNDERPADS AND DIAPERS) ×5 IMPLANT
WATER STERILE IRR 1000ML POUR (IV SOLUTION) ×3 IMPLANT

## 2012-05-25 NOTE — ED Notes (Signed)
Splint placed to left arm by Ortho tech
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
## 2012-05-25 NOTE — Anesthesia Preprocedure Evaluation (Addendum)
Anesthesia Evaluation  Patient identified by MRN, date of birth, ID band Patient awake    Reviewed: Allergy & Precautions  History of Anesthesia Complications Negative for: history of anesthetic complications  Airway Mallampati: II  Neck ROM: Full    Dental  (+) Teeth Intact and Dental Advisory Given   Pulmonary asthma , Current Smoker,    Pulmonary exam normal       Cardiovascular hypertension, Pt. 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  Reproductive/Obstetrics                       Anesthesia Physical Anesthesia Plan  ASA: III and emergent  Anesthesia Plan: General   Post-op Pain Management:    Induction: Intravenous, Rapid sequence and Cricoid pressure planned  Airway Management Planned: Oral ETT and Video Laryngoscope Planned  Additional Equipment:   Intra-op Plan:   Post-operative Plan: Extubation in OR  Informed Consent: I have reviewed the patients History and Physical, chart, labs and discussed the procedure including the risks, benefits and alternatives for the proposed anesthesia with the patient or authorized representative who has indicated his/her understanding and acceptance.   Dental advisory given  Plan Discussed with: CRNA, Anesthesiologist and Surgeon  Anesthesia Plan Comments: (In cervical collar for C6 fx)       Anesthesia Quick Evaluation

## 2012-05-25 NOTE — Progress Notes (Signed)
Orthopedic Tech Progress Note Patient Details:  Dean Bowers [facility] 05-05-1970 [id number]  Ortho Devices Type of Ortho Device: Ace wrap;Sugartong splint;Post (long arm) splint;Arm sling Ortho Device/Splint Location: LUE Ortho Device/Splint Interventions: Ordered;Application   Jennye Moccasin 05/25/2012, 5:40 PM

## 2012-05-25 NOTE — Anesthesia Procedure Notes (Signed)
Procedure Name: Intubation Date/Time: 05/25/2012 9:06 PM Performed by: Wray Kearns A Pre-anesthesia Checklist: Patient identified, Timeout performed, Emergency Drugs available, Suction available and Patient being monitored Patient Re-evaluated:Patient Re-evaluated prior to inductionOxygen Delivery Method: Circle system utilized Intubation Type: IV induction, Cricoid Pressure applied and Rapid sequence Laryngoscope Size: Mac and 4 Grade View: Grade I Tube type: Subglottic suction tube Tube size: 8.5 mm Number of attempts: 1 Airway Equipment and Method: Patient positioned with wedge pillow and Bougie stylet Placement Confirmation: ETT inserted through vocal cords under direct vision,  breath sounds checked- equal and bilateral and positive ETCO2 Secured at: 24 cm Tube secured with: Tape Dental Injury: Teeth and Oropharynx as per pre-operative assessment
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
## 2012-05-25 NOTE — ED Notes (Signed)
Family at the bedside prior to transport to CT

## 2012-05-25 NOTE — Preoperative (Addendum)
Beta Blockers   Reason not to administer Beta Blockers:taken Fri, 4/18
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
## 2012-05-25 NOTE — ED Notes (Signed)
Pt to CT with this RN and Dr. Donell Beers
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
## 2012-05-25 NOTE — ED Notes (Signed)
Dr. Charlann Boxer (ortho) at bedside. Ok to transport patient to OR holding after portable xray's are completed

## 2012-05-26 ENCOUNTER — Inpatient Hospital Stay (HOSPITAL_COMMUNITY): Payer: 59

## 2012-05-26 ENCOUNTER — Other Ambulatory Visit: Payer: Self-pay | Admitting: Neurosurgery

## 2012-05-26 ENCOUNTER — Encounter (HOSPITAL_COMMUNITY): Payer: Self-pay | Admitting: Neurology

## 2012-05-26 DIAGNOSIS — S88119A Complete traumatic amputation at level between knee and ankle, unspecified lower leg, initial encounter: Secondary | ICD-10-CM

## 2012-05-26 DIAGNOSIS — S5290XA Unspecified fracture of unspecified forearm, initial encounter for closed fracture: Secondary | ICD-10-CM

## 2012-05-26 DIAGNOSIS — S22009A Unspecified fracture of unspecified thoracic vertebra, initial encounter for closed fracture: Secondary | ICD-10-CM

## 2012-05-26 DIAGNOSIS — S98911A Complete traumatic amputation of right foot, level unspecified, initial encounter: Secondary | ICD-10-CM

## 2012-05-26 DIAGNOSIS — S7290XA Unspecified fracture of unspecified femur, initial encounter for closed fracture: Secondary | ICD-10-CM

## 2012-05-26 DIAGNOSIS — S7291XA Unspecified fracture of right femur, initial encounter for closed fracture: Secondary | ICD-10-CM

## 2012-05-26 DIAGNOSIS — D62 Acute posthemorrhagic anemia: Secondary | ICD-10-CM

## 2012-05-26 LAB — BASIC METABOLIC PANEL
CO2: 26 mEq/L (ref 19–32)
Calcium: 7.5 mg/dL — ABNORMAL LOW (ref 8.4–10.5)
Glucose, Bld: 192 mg/dL — ABNORMAL HIGH (ref 70–99)
Sodium: 137 mEq/L (ref 135–145)

## 2012-05-26 LAB — CBC
Hemoglobin: 9.4 g/dL — ABNORMAL LOW (ref 13.0–17.0)
MCH: 29.1 pg (ref 26.0–34.0)
MCV: 83.6 fL (ref 78.0–100.0)
RBC: 3.23 MIL/uL — ABNORMAL LOW (ref 4.22–5.81)

## 2012-05-26 LAB — CDS SEROLOGY

## 2012-05-26 MED ORDER — DIPHENHYDRAMINE HCL 12.5 MG/5ML PO ELIX
12.5000 mg | ORAL_SOLUTION | Freq: Four times a day (QID) | ORAL | Status: DC | PRN
  Filled 2012-05-26: qty 5

## 2012-05-26 MED ORDER — ONDANSETRON HCL 4 MG/2ML IJ SOLN: 4.0000 mg | Freq: Four times a day (QID) | INTRAMUSCULAR | Status: DC | PRN

## 2012-05-26 MED ORDER — METOCLOPRAMIDE HCL 5 MG PO TABS
5.0000 mg | ORAL_TABLET | Freq: Three times a day (TID) | ORAL | Status: DC | PRN
  Filled 2012-05-26: qty 2

## 2012-05-26 MED ORDER — KCL IN DEXTROSE-NACL 20-5-0.45 MEQ/L-%-% IV SOLN
INTRAVENOUS | Status: AC
  Filled 2012-05-26: qty 1000

## 2012-05-26 MED ORDER — PREGABALIN 50 MG PO CAPS
75.0000 mg | ORAL_CAPSULE | Freq: Two times a day (BID) | ORAL | Status: DC
  Filled 2012-05-26: qty 1

## 2012-05-26 MED ORDER — KCL IN DEXTROSE-NACL 20-5-0.45 MEQ/L-%-% IV SOLN
INTRAVENOUS | Status: DC
  Administered 2012-05-26 – 2012-05-28 (×6): via INTRAVENOUS
  Administered 2012-05-29: 1000 mL via INTRAVENOUS
  Filled 2012-05-26 (×12): qty 1000

## 2012-05-26 MED ORDER — PHENOL 1.4 % MT LIQD: 1.0000 | OROMUCOSAL | Status: DC | PRN

## 2012-05-26 MED ORDER — ATENOLOL 50 MG PO TABS
50.0000 mg | ORAL_TABLET | Freq: Every day | ORAL | Status: DC
  Administered 2012-05-26: 50 mg via ORAL
  Filled 2012-05-26 (×2): qty 1

## 2012-05-26 MED ORDER — METOCLOPRAMIDE HCL 5 MG/ML IJ SOLN
5.0000 mg | Freq: Three times a day (TID) | INTRAMUSCULAR | Status: DC | PRN
  Filled 2012-05-26: qty 2

## 2012-05-26 MED ORDER — METHOCARBAMOL 100 MG/ML IJ SOLN
500.0000 mg | Freq: Four times a day (QID) | INTRAVENOUS | Status: DC | PRN
  Filled 2012-05-26: qty 5

## 2012-05-26 MED ORDER — MENTHOL 3 MG MT LOZG: 1.0000 | LOZENGE | OROMUCOSAL | Status: DC | PRN

## 2012-05-26 MED ORDER — MORPHINE SULFATE (PF) 1 MG/ML IV SOLN
INTRAVENOUS | Status: AC
  Filled 2012-05-26: qty 25

## 2012-05-26 MED ORDER — ONDANSETRON HCL 4 MG PO TABS: 4.0000 mg | ORAL_TABLET | Freq: Four times a day (QID) | ORAL | Status: DC | PRN

## 2012-05-26 MED ORDER — SODIUM CHLORIDE 0.9 % IJ SOLN: 9.0000 mL | INTRAMUSCULAR | Status: DC | PRN

## 2012-05-26 MED ORDER — NEOSTIGMINE METHYLSULFATE 1 MG/ML IJ SOLN
INTRAMUSCULAR | Status: DC | PRN
  Administered 2012-05-26: 5 mg via INTRAVENOUS

## 2012-05-26 MED ORDER — HYDROMORPHONE HCL PF 1 MG/ML IJ SOLN
0.2500 mg | INTRAMUSCULAR | Status: DC | PRN
  Administered 2012-05-26: 0.5 mg via INTRAVENOUS

## 2012-05-26 MED ORDER — CEFAZOLIN SODIUM-DEXTROSE 2-3 GM-% IV SOLR
2.0000 g | Freq: Four times a day (QID) | INTRAVENOUS | Status: AC
  Administered 2012-05-26 (×2): 2 g via INTRAVENOUS
  Filled 2012-05-26 (×2): qty 50

## 2012-05-26 MED ORDER — MORPHINE SULFATE (PF) 1 MG/ML IV SOLN
INTRAVENOUS | Status: DC
  Administered 2012-05-26 (×3): via INTRAVENOUS
  Administered 2012-05-26: 29.97 mg via INTRAVENOUS
  Administered 2012-05-26: 12 mg via INTRAVENOUS
  Administered 2012-05-26: 25.5 mg via INTRAVENOUS
  Administered 2012-05-26: 24 mg via INTRAVENOUS
  Administered 2012-05-26: 02:00:00 via INTRAVENOUS
  Administered 2012-05-27: 10.96 mg via INTRAVENOUS
  Filled 2012-05-26 (×4): qty 25

## 2012-05-26 MED ORDER — HYDROMORPHONE HCL PF 1 MG/ML IJ SOLN
INTRAMUSCULAR | Status: AC
  Filled 2012-05-26: qty 1

## 2012-05-26 MED ORDER — DOCUSATE SODIUM 100 MG PO CAPS
100.0000 mg | ORAL_CAPSULE | Freq: Two times a day (BID) | ORAL | Status: DC
  Administered 2012-05-26: 100 mg via ORAL
  Filled 2012-05-26 (×4): qty 1

## 2012-05-26 MED ORDER — CEFAZOLIN SODIUM 1-5 GM-% IV SOLN
1.0000 g | Freq: Three times a day (TID) | INTRAVENOUS | Status: DC
  Administered 2012-05-26 – 2012-05-27 (×3): 1 g via INTRAVENOUS
  Filled 2012-05-26 (×6): qty 50

## 2012-05-26 MED ORDER — OXYCODONE HCL 5 MG PO TABS: 5.0000 mg | ORAL_TABLET | Freq: Once | ORAL | Status: DC | PRN

## 2012-05-26 MED ORDER — ONDANSETRON HCL 4 MG/2ML IJ SOLN
4.0000 mg | Freq: Four times a day (QID) | INTRAMUSCULAR | Status: DC | PRN
  Administered 2012-05-30: 4 mg via INTRAVENOUS
  Filled 2012-05-26: qty 2

## 2012-05-26 MED ORDER — PROMETHAZINE HCL 25 MG/ML IJ SOLN: 6.2500 mg | INTRAMUSCULAR | Status: DC | PRN

## 2012-05-26 MED ORDER — MORPHINE SULFATE 2 MG/ML IJ SOLN: 1.0000 mg | INTRAMUSCULAR | Status: DC | PRN

## 2012-05-26 MED ORDER — METHOCARBAMOL 500 MG PO TABS
500.0000 mg | ORAL_TABLET | Freq: Four times a day (QID) | ORAL | Status: DC | PRN
  Filled 2012-05-26: qty 1

## 2012-05-26 MED ORDER — GLYCOPYRROLATE 0.2 MG/ML IJ SOLN
INTRAMUSCULAR | Status: DC | PRN
  Administered 2012-05-26: 0.6 mg via INTRAVENOUS

## 2012-05-26 MED ORDER — NALOXONE HCL 0.4 MG/ML IJ SOLN: 0.4000 mg | INTRAMUSCULAR | Status: DC | PRN

## 2012-05-26 MED ORDER — SIMVASTATIN 20 MG PO TABS
20.0000 mg | ORAL_TABLET | Freq: Every day | ORAL | Status: DC
  Administered 2012-05-26: 20 mg via ORAL
  Filled 2012-05-26 (×2): qty 1

## 2012-05-26 MED ORDER — LORAZEPAM 0.5 MG PO TABS
0.5000 mg | ORAL_TABLET | ORAL | Status: DC | PRN
  Administered 2012-05-26: 0.5 mg via ORAL
  Filled 2012-05-26: qty 1

## 2012-05-26 MED ORDER — DIPHENHYDRAMINE HCL 50 MG/ML IJ SOLN
12.5000 mg | Freq: Four times a day (QID) | INTRAMUSCULAR | Status: DC | PRN
  Administered 2012-05-26: 12.5 mg via INTRAVENOUS
  Filled 2012-05-26: qty 1

## 2012-05-26 MED ORDER — ALUM & MAG HYDROXIDE-SIMETH 200-200-20 MG/5ML PO SUSP: 30.0000 mL | ORAL | Status: DC | PRN

## 2012-05-26 MED ORDER — OXYCODONE HCL 5 MG/5ML PO SOLN: 5.0000 mg | Freq: Once | ORAL | Status: DC | PRN

## 2012-05-26 MED ORDER — CEFAZOLIN SODIUM 1-5 GM-% IV SOLN
1.0000 g | Freq: Three times a day (TID) | INTRAVENOUS | Status: DC
  Filled 2012-05-26 (×2): qty 50
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
## 2012-05-26 NOTE — Anesthesia Postprocedure Evaluation (Signed)
Anesthesia Post Note  Patient: Dean Bowers [facility]  Procedure(s) Performed: Procedure(s) (LRB): AMPUTATION BELOW KNEE (Right) INTRAMEDULLARY (IM) NAIL FEMORAL  (Right) CAST APPLICATION (Left)  Anesthesia type: general  Patient location: PACU  Post pain: Pain level controlled  Post assessment: Patient's Cardiovascular Status Stable  Last Vitals:  Filed Vitals:   05/26/12 0200  BP: 107/61  Pulse: 125  Temp:   Resp: 27    Post vital signs: Reviewed and stable  Level of consciousness: sedated  Complications: No apparent anesthesia complications
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
## 2012-05-26 NOTE — Progress Notes (Signed)
UR completed 

## 2012-05-26 NOTE — Brief Op Note (Signed)
05/25/2012  1:30 AM  PATIENT:  Dean Bowers  42 y.o. male  PRE-OPERATIVE DIAGNOSIS:  1.  Closed left segmental both bone forearm fracture.  2.  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  3.  Right closed distal one third diaphyseal femur fracture.  POST-OPERATIVE DIAGNOSIS: 1.  Closed left segmental both bone forearm fracture.  2.  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  3.  Right closed distal one third diaphyseal femur fracture.  PROCEDURE:  Procedure(s) with comments: 1.  Closed reduction application of long arm splint left both bone forearm fracture 2.  ORIF right distal femur fracture - retrograde intramedullary nail 3.  Guillotine below knee amputation of right leg    SURGEON:  Surgeon(s) and Role: Panel 1:    * Shelda Pal, MD - Primary  Panel 2:    * Shelda Pal, MD - Primary  Panel 3:    * Shelda Pal, MD - Primary  PHYSICIAN ASSISTANT: Lanney Gins, PA-C  ANESTHESIA:   general  EBL:  Total I/O In: 4500 [I.V.:4000; IV Piggyback:500] Out: 1500 [Urine:950; Blood:550]  BLOOD ADMINISTERED:none  DRAINS: none   LOCAL MEDICATIONS USED:  NONE  SPECIMEN:  No Specimen  DISPOSITION OF SPECIMEN:  N/A  COUNTS:  YES  TOURNIQUET:  * No tourniquets in log *  DICTATION: .Other Dictation: Dictation Number [id number]  PLAN OF CARE: Admit to inpatient   PATIENT DISPOSITION:  PACU - hemodynamically stable.   Delay start of Pharmacological VTE agent (>24hrs) due to surgical blood loss or risk of bleeding: yes

## 2012-05-26 NOTE — Consult Note (Signed)
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 likely take 4 or 5 hours in total for the left upper extremity and right lower extremity   Per Dr. Rush Farmer note please follow spine precautions and log roll to move due to T12 fracture   6. ID  Continue Ancef for open wound to the right leg 7. Phantom pain right leg  Initiate neuropathic agent for pain control as well  8. DVT and PE prophylaxis  SCDs  Will eventually need some pharmacologic agent after or so and neurosurgery procedures complete given immobility as well as magnitude of his injury  9. FEN  N.p.o. after midnight   10. Polysubstance abuse  tox screen positive for marijuana, methamphetamine and opiods (likely from narcotics received enroute to hospital)  11. Disposition  OR tomorrow for repeat I&D of right lower leg, ORIF left radius and ulna  check additional x-rays the right tibia to evaluate the amount of bone that remains viable as well as an x-ray of the left forearm   Mearl Latin, PA-C [facility] [phone] (P) 05/26/2012, 9:10 AM
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
## 2012-05-26 NOTE — Progress Notes (Signed)
Patient examined and I agree with the assessment and plan I also spoke to his wife, grandparents , and brother at the bedside to update the plan of care.  I D/W Dr. Venetia Maxon as well and plan is to try to coordinate OR for tomorrow PM NS and ortho trauma. Violeta Gelinas, MD, MPH, FACS Pager: [phone]  05/26/2012 11:27 AM

## 2012-05-26 NOTE — Progress Notes (Signed)
Patient ID: Dean Bowers, male   DOB: 10/02/1970, 42 y.o.   MRN: [id number]  Alert, conversant, w/o c/o pain at present. Family in room.  Plan for tomorrow d/w pt & family: am surgeries by Dr. Carola Frost, followed by repositioning & Dr. Fredrich Birks stabilization of the spinal fracture.  Pt & family verbalize understanding that pt will remain under anesthesia for all surgeries.  Pt agrees with plan & has no questions or concerns at present.  Georgiann Cocker, RN, BSN

## 2012-05-27 ENCOUNTER — Inpatient Hospital Stay (HOSPITAL_COMMUNITY): Payer: 59

## 2012-05-27 ENCOUNTER — Encounter (HOSPITAL_COMMUNITY)
Admission: EM | Disposition: A | Discharge status: Other Acute Inpatient Hospital | Payer: Self-pay | Source: Home / Self Care

## 2012-05-27 ENCOUNTER — Inpatient Hospital Stay (HOSPITAL_COMMUNITY): Payer: 59 | Admitting: Anesthesiology

## 2012-05-27 ENCOUNTER — Encounter (HOSPITAL_COMMUNITY): Payer: Self-pay | Admitting: Anesthesiology

## 2012-05-27 ENCOUNTER — Encounter (HOSPITAL_COMMUNITY): Payer: Self-pay | Admitting: Certified Registered Nurse Anesthetist

## 2012-05-27 DIAGNOSIS — J95821 Acute postprocedural respiratory failure: Secondary | ICD-10-CM

## 2012-05-27 HISTORY — PX: I & D EXTREMITY: SHX5045

## 2012-05-27 HISTORY — PX: ORIF RADIAL FRACTURE: SHX5113

## 2012-05-27 HISTORY — PX: POSTERIOR FUSION THORACIC SPINE: SUR1045

## 2012-05-27 LAB — BASIC METABOLIC PANEL
BUN: 12 mg/dL (ref 6–23)
CO2: 32 mEq/L (ref 19–32)
Calcium: 7.5 mg/dL — ABNORMAL LOW (ref 8.4–10.5)
Chloride: 101 mEq/L (ref 96–112)
Creatinine, Ser: 0.63 mg/dL (ref 0.50–1.35)
GFR calc Af Amer: >90 mL/min (ref >90)
GFR calc non Af Amer: >90 mL/min (ref >90)
Glucose, Bld: 138 mg/dL — ABNORMAL HIGH (ref 70–99)
Potassium: 4.6 mEq/L (ref 3.5–5.1)
Sodium: 136 mEq/L (ref 135–145)

## 2012-05-27 LAB — PREPARE RBC (CROSSMATCH)

## 2012-05-27 LAB — CBC
HCT: 18 % — ABNORMAL LOW (ref 39.0–52.0)
Hemoglobin: 6.1 g/dL — CL (ref 13.0–17.0)
MCH: 28.8 pg (ref 26.0–34.0)
MCHC: 33.9 g/dL (ref 30.0–36.0)
MCV: 84.9 fL (ref 78.0–100.0)
Platelets: 133 10*3/uL — ABNORMAL LOW (ref 150–400)
RBC: 2.12 MIL/uL — ABNORMAL LOW (ref 4.22–5.81)
RDW: 13.3 % (ref 11.5–15.5)
WBC: 10.5 10*3/uL (ref 4.0–10.5)

## 2012-05-27 SURGERY — POSTERIOR LUMBAR FUSION 1 LEVEL
Anesthesia: General | Site: Leg Lower | Laterality: Right | Wound class: Clean

## 2012-05-27 MED ORDER — LACTATED RINGERS IV SOLN
INTRAVENOUS | Status: DC | PRN
  Administered 2012-05-27 (×3): via INTRAVENOUS

## 2012-05-27 MED ORDER — PHENYLEPHRINE HCL 10 MG/ML IJ SOLN
INTRAMUSCULAR | Status: DC | PRN
  Administered 2012-05-27: 120 ug via INTRAVENOUS

## 2012-05-27 MED ORDER — PROMETHAZINE HCL 25 MG/ML IJ SOLN: 6.2500 mg | INTRAMUSCULAR | Status: DC | PRN

## 2012-05-27 MED ORDER — 0.9 % SODIUM CHLORIDE (POUR BTL) OPTIME
TOPICAL | Status: DC | PRN
  Administered 2012-05-27: 1000 mL

## 2012-05-27 MED ORDER — FENTANYL BOLUS VIA INFUSION
25.0000 ug | Freq: Four times a day (QID) | INTRAVENOUS | Status: DC | PRN
  Filled 2012-05-27: qty 100

## 2012-05-27 MED ORDER — CEFAZOLIN SODIUM 1-5 GM-% IV SOLN
INTRAVENOUS | Status: AC
  Filled 2012-05-27: qty 50

## 2012-05-27 MED ORDER — HYDROMORPHONE HCL PF 1 MG/ML IJ SOLN: 0.2500 mg | INTRAMUSCULAR | Status: DC | PRN

## 2012-05-27 MED ORDER — LIDOCAINE HCL (CARDIAC) 20 MG/ML IV SOLN
INTRAVENOUS | Status: DC | PRN
  Administered 2012-05-27: 100 mg via INTRAVENOUS

## 2012-05-27 MED ORDER — PHENOL 1.4 % MT LIQD: 1.0000 | OROMUCOSAL | Status: DC | PRN

## 2012-05-27 MED ORDER — METHOCARBAMOL 500 MG PO TABS
500.0000 mg | ORAL_TABLET | Freq: Four times a day (QID) | ORAL | Status: DC | PRN
  Filled 2012-05-27 (×2): qty 2

## 2012-05-27 MED ORDER — PROPOFOL INFUSION 10 MG/ML OPTIME
INTRAVENOUS | Status: DC | PRN
  Administered 2012-05-27: 50 ug/kg/min via INTRAVENOUS

## 2012-05-27 MED ORDER — SUCCINYLCHOLINE CHLORIDE 20 MG/ML IJ SOLN
INTRAMUSCULAR | Status: DC | PRN
  Administered 2012-05-27: 120 mg via INTRAVENOUS

## 2012-05-27 MED ORDER — CEFAZOLIN SODIUM-DEXTROSE 2-3 GM-% IV SOLR
INTRAVENOUS | Status: AC
  Filled 2012-05-27: qty 50

## 2012-05-27 MED ORDER — ACETAMINOPHEN 10 MG/ML IV SOLN
1000.0000 mg | Freq: Four times a day (QID) | INTRAVENOUS | Status: AC
  Administered 2012-05-27 – 2012-05-28 (×4): 1000 mg via INTRAVENOUS
  Filled 2012-05-27 (×4): qty 100

## 2012-05-27 MED ORDER — KCL IN DEXTROSE-NACL 20-5-0.45 MEQ/L-%-% IV SOLN: INTRAVENOUS | Status: DC

## 2012-05-27 MED ORDER — LIDOCAINE-EPINEPHRINE 1 %-1:100000 IJ SOLN
INTRAMUSCULAR | Status: DC | PRN
  Administered 2012-05-27: 10 mL

## 2012-05-27 MED ORDER — BUPIVACAINE HCL 0.5 % IJ SOLN
INTRAMUSCULAR | Status: DC | PRN
  Administered 2012-05-27: 10 mL

## 2012-05-27 MED ORDER — ROCURONIUM BROMIDE 100 MG/10ML IV SOLN
INTRAVENOUS | Status: DC | PRN
  Administered 2012-05-27: 30 mg via INTRAVENOUS
  Administered 2012-05-27 (×2): 10 mg via INTRAVENOUS
  Administered 2012-05-27 (×2): 20 mg via INTRAVENOUS
  Administered 2012-05-27 (×2): 50 mg via INTRAVENOUS

## 2012-05-27 MED ORDER — SODIUM CHLORIDE 0.9 % IV SOLN
25.0000 ug/h | INTRAVENOUS | Status: DC
  Administered 2012-05-27: 50 ug/h via INTRAVENOUS
  Filled 2012-05-27 (×2): qty 50

## 2012-05-27 MED ORDER — SODIUM CHLORIDE 0.9 % IJ SOLN: 3.0000 mL | INTRAMUSCULAR | Status: DC | PRN

## 2012-05-27 MED ORDER — BUPIVACAINE-EPINEPHRINE PF 0.25-1:200000 % IJ SOLN
INTRAMUSCULAR | Status: DC | PRN
  Administered 2012-05-27: 18 mL

## 2012-05-27 MED ORDER — CHLORHEXIDINE GLUCONATE 0.12 % MT SOLN
15.0000 mL | Freq: Two times a day (BID) | OROMUCOSAL | Status: DC
  Administered 2012-05-27 – 2012-05-28 (×2): 15 mL via OROMUCOSAL
  Filled 2012-05-27: qty 15

## 2012-05-27 MED ORDER — ALBUMIN HUMAN 5 % IV SOLN
INTRAVENOUS | Status: DC | PRN
  Administered 2012-05-27: 10:00:00 via INTRAVENOUS

## 2012-05-27 MED ORDER — BIOTENE DRY MOUTH MT LIQD
15.0000 mL | Freq: Four times a day (QID) | OROMUCOSAL | Status: DC
  Administered 2012-05-27 – 2012-05-28 (×4): 15 mL via OROMUCOSAL

## 2012-05-27 MED ORDER — PROPOFOL 10 MG/ML IV EMUL
5.0000 ug/kg/min | INTRAVENOUS | Status: DC
  Administered 2012-05-27 – 2012-05-28 (×5): 50 ug/kg/min via INTRAVENOUS
  Filled 2012-05-27 (×5): qty 100

## 2012-05-27 MED ORDER — ONDANSETRON HCL 4 MG/2ML IJ SOLN: 4.0000 mg | INTRAMUSCULAR | Status: DC | PRN

## 2012-05-27 MED ORDER — SODIUM CHLORIDE 0.9 % IJ SOLN
3.0000 mL | Freq: Two times a day (BID) | INTRAMUSCULAR | Status: DC
  Administered 2012-05-27 – 2012-05-28 (×2): 3 mL via INTRAVENOUS
  Administered 2012-05-29: 09:00:00 via INTRAVENOUS
  Administered 2012-05-30: 3 mL via INTRAVENOUS

## 2012-05-27 MED ORDER — PANTOPRAZOLE SODIUM 40 MG IV SOLR
40.0000 mg | Freq: Every day | INTRAVENOUS | Status: DC
  Administered 2012-05-27 – 2012-05-28 (×2): 40 mg via INTRAVENOUS
  Filled 2012-05-27 (×2): qty 40

## 2012-05-27 MED ORDER — ACETAMINOPHEN 650 MG RE SUPP: 650.0000 mg | RECTAL | Status: DC | PRN

## 2012-05-27 MED ORDER — HYDROCODONE-ACETAMINOPHEN 5-325 MG PO TABS
1.0000 | ORAL_TABLET | ORAL | Status: DC | PRN
  Administered 2012-05-29 (×2): 1 via ORAL
  Filled 2012-05-27 (×2): qty 1

## 2012-05-27 MED ORDER — OXYCODONE HCL 5 MG PO TABS: 5.0000 mg | ORAL_TABLET | ORAL | Status: DC | PRN

## 2012-05-27 MED ORDER — ACETAMINOPHEN 325 MG PO TABS: 650.0000 mg | ORAL_TABLET | ORAL | Status: DC | PRN

## 2012-05-27 MED ORDER — METHOCARBAMOL 100 MG/ML IJ SOLN
500.0000 mg | Freq: Four times a day (QID) | INTRAVENOUS | Status: DC | PRN
  Administered 2012-05-28: 1000 mg via INTRAVENOUS
  Filled 2012-05-27 (×2): qty 10

## 2012-05-27 MED ORDER — OXYCODONE HCL 5 MG/5ML PO SOLN: 5.0000 mg | Freq: Once | ORAL | Status: DC | PRN

## 2012-05-27 MED ORDER — HEMOSTATIC AGENTS (NO CHARGE) OPTIME: TOPICAL | Status: DC | PRN

## 2012-05-27 MED ORDER — CEFAZOLIN SODIUM 1-5 GM-% IV SOLN
INTRAVENOUS | Status: AC
  Filled 2012-05-27: qty 100

## 2012-05-27 MED ORDER — PROPOFOL 10 MG/ML IV BOLUS
INTRAVENOUS | Status: DC | PRN
  Administered 2012-05-27: 200 mg via INTRAVENOUS

## 2012-05-27 MED ORDER — CEFAZOLIN SODIUM 1-5 GM-% IV SOLN: 1.0000 g | Freq: Three times a day (TID) | INTRAVENOUS | Status: DC

## 2012-05-27 MED ORDER — FENTANYL CITRATE 0.05 MG/ML IJ SOLN
INTRAMUSCULAR | Status: DC | PRN
  Administered 2012-05-27: 50 ug via INTRAVENOUS
  Administered 2012-05-27: 150 ug via INTRAVENOUS
  Administered 2012-05-27: 100 ug via INTRAVENOUS
  Administered 2012-05-27: 25 ug via INTRAVENOUS
  Administered 2012-05-27: 100 ug via INTRAVENOUS
  Administered 2012-05-27: 150 ug via INTRAVENOUS
  Administered 2012-05-27: 100 ug via INTRAVENOUS
  Administered 2012-05-27: 250 ug via INTRAVENOUS
  Administered 2012-05-27: 75 ug via INTRAVENOUS

## 2012-05-27 MED ORDER — HYDROMORPHONE HCL PF 1 MG/ML IJ SOLN
INTRAMUSCULAR | Status: DC | PRN
  Administered 2012-05-27: 1 mg via INTRAVENOUS

## 2012-05-27 MED ORDER — MEPERIDINE HCL 25 MG/ML IJ SOLN: 6.2500 mg | INTRAMUSCULAR | Status: DC | PRN

## 2012-05-27 MED ORDER — OXYCODONE-ACETAMINOPHEN 5-325 MG PO TABS
1.0000 | ORAL_TABLET | ORAL | Status: DC | PRN
  Administered 2012-05-28: 1 via ORAL
  Filled 2012-05-27: qty 1

## 2012-05-27 MED ORDER — BUPIVACAINE-EPINEPHRINE 0.25% -1:200000 IJ SOLN
INTRAMUSCULAR | Status: AC
  Filled 2012-05-27: qty 1

## 2012-05-27 MED ORDER — OXYCODONE HCL 5 MG PO TABS: 5.0000 mg | ORAL_TABLET | Freq: Once | ORAL | Status: DC | PRN

## 2012-05-27 MED ORDER — SURGIFOAM 100 EX MISC
CUTANEOUS | Status: DC | PRN
  Administered 2012-05-27: 14:00:00 via TOPICAL

## 2012-05-27 MED ORDER — SODIUM CHLORIDE 0.9 % IR SOLN
Status: DC | PRN
  Administered 2012-05-27: 1000 mL
  Administered 2012-05-27: 3000 mL

## 2012-05-27 MED ORDER — MORPHINE SULFATE 2 MG/ML IJ SOLN
1.0000 mg | INTRAMUSCULAR | Status: DC | PRN
  Administered 2012-05-28 – 2012-05-29 (×2): 4 mg via INTRAVENOUS
  Administered 2012-05-29: 2 mg via INTRAVENOUS
  Filled 2012-05-27 (×2): qty 2
  Filled 2012-05-27: qty 1

## 2012-05-27 MED ORDER — MENTHOL 3 MG MT LOZG: 1.0000 | LOZENGE | OROMUCOSAL | Status: DC | PRN

## 2012-05-27 MED ORDER — CEFAZOLIN SODIUM 1-5 GM-% IV SOLN
1.0000 g | Freq: Three times a day (TID) | INTRAVENOUS | Status: AC
  Administered 2012-05-27 – 2012-05-29 (×6): 1 g via INTRAVENOUS
  Filled 2012-05-27 (×6): qty 50

## 2012-05-27 SURGICAL SUPPLY — 146 items
100mm Rod ×2 IMPLANT
2.5cc AlphaGraft DBM Putty ×2 IMPLANT
5cc AlphaGraft DBM Putty ×2 IMPLANT
ADH SKN CLS APL DERMABOND .7 (GAUZE/BANDAGES/DRESSINGS) ×3
APL SKNCLS STERI-STRIP NONHPOA (GAUZE/BANDAGES/DRESSINGS) ×3
BAG DECANTER FOR FLEXI CONT (MISCELLANEOUS) ×4 IMPLANT
BANDAGE ELASTIC 3 VELCRO ST LF (GAUZE/BANDAGES/DRESSINGS) ×1 IMPLANT
BANDAGE GAUZE ELAST BULKY 4 IN (GAUZE/BANDAGES/DRESSINGS) ×6 IMPLANT
BENZOIN TINCTURE PRP APPL 2/3 (GAUZE/BANDAGES/DRESSINGS) ×1 IMPLANT
BIT DRILL NEURO 2X3.1 SFT TUCH (MISCELLANEOUS) ×3 IMPLANT
BLADE AVERAGE 25X9 (BLADE) ×1 IMPLANT
BLADE SURG 10 STRL SS (BLADE) ×4 IMPLANT
BLADE SURG ROTATE 9660 (MISCELLANEOUS) ×2 IMPLANT
BNDG ADH 5X4 AIR PERM ELC (GAUZE/BANDAGES/DRESSINGS) ×3
BNDG CMPR MED 15X6 ELC VLCR LF (GAUZE/BANDAGES/DRESSINGS) ×3
BNDG COHESIVE 4X5 TAN STRL (GAUZE/BANDAGES/DRESSINGS) ×3 IMPLANT
BNDG COHESIVE 4X5 WHT NS (GAUZE/BANDAGES/DRESSINGS) ×1 IMPLANT
BNDG ELASTIC 6X15 VLCR STRL LF (GAUZE/BANDAGES/DRESSINGS) ×1 IMPLANT
BNDG GAUZE STRTCH 6 (GAUZE/BANDAGES/DRESSINGS) ×9 IMPLANT
BRUSH SCRUB DISP (MISCELLANEOUS) ×10 IMPLANT
BUCKET CAST 5QT PAPER WAX WHT (CAST SUPPLIES) ×1 IMPLANT
BUR ROUND FLUTED 5 RND (BURR) ×4 IMPLANT
CANISTER SUCTION 2500CC (MISCELLANEOUS) ×4 IMPLANT
CANISTER WOUND CARE 500ML ATS (WOUND CARE) ×1 IMPLANT
CAST PADDING SYN 3 (CAST SUPPLIES) ×1 IMPLANT
CATH URET WHISTLE 8FR 331008 (CATHETERS) ×1 IMPLANT
CLOTH BEACON ORANGE TIMEOUT ST (SAFETY) ×8 IMPLANT
CONT SPEC 4OZ CLIKSEAL STRL BL (MISCELLANEOUS) ×1 IMPLANT
COVER SURGICAL LIGHT HANDLE (MISCELLANEOUS) ×8 IMPLANT
DERMABOND ADVANCED (GAUZE/BANDAGES/DRESSINGS) ×1
DERMABOND ADVANCED .7 DNX12 (GAUZE/BANDAGES/DRESSINGS) ×3 IMPLANT
DRAPE C-ARM 42X72 X-RAY (DRAPES) ×2 IMPLANT
DRAPE C-ARMOR (DRAPES) ×5 IMPLANT
DRAPE LAPAROTOMY 100X72X124 (DRAPES) ×3 IMPLANT
DRAPE LAPAROTOMY T 102X78X121 (DRAPES) ×1 IMPLANT
DRAPE MICROSCOPE LEICA (MISCELLANEOUS) ×3 IMPLANT
DRAPE POUCH INSTRU U-SHP 10X18 (DRAPES) ×4 IMPLANT
DRAPE U-SHAPE 47X51 STRL (DRAPES) ×4 IMPLANT
DRESSING TELFA 8X3 (GAUZE/BANDAGES/DRESSINGS) IMPLANT
DRILL NEURO 2X3.1 SOFT TOUCH (MISCELLANEOUS) ×4
DRSG ADAPTIC 3X8 NADH LF (GAUZE/BANDAGES/DRESSINGS) ×3 IMPLANT
DRSG MEPITEL 4X7.2 (GAUZE/BANDAGES/DRESSINGS) ×1 IMPLANT
DRSG VAC ATS MED SENSATRAC (GAUZE/BANDAGES/DRESSINGS) ×1 IMPLANT
DURAPREP 26ML APPLICATOR (WOUND CARE) ×4 IMPLANT
ELECT BLADE 4.0 EZ CLEAN MEGAD (MISCELLANEOUS) ×4
ELECT CAUTERY BLADE 6.4 (BLADE) IMPLANT
ELECT REM PT RETURN 9FT ADLT (ELECTROSURGICAL) ×8
ELECTRODE BLDE 4.0 EZ CLN MEGD (MISCELLANEOUS) IMPLANT
ELECTRODE REM PT RTRN 9FT ADLT (ELECTROSURGICAL) ×3 IMPLANT
GAUZE SPONGE 4X4 16PLY XRAY LF (GAUZE/BANDAGES/DRESSINGS) IMPLANT
GLOVE BIO SURGEON STRL SZ7.5 (GLOVE) ×7 IMPLANT
GLOVE BIO SURGEON STRL SZ8 (GLOVE) ×10 IMPLANT
GLOVE BIOGEL PI IND STRL 7.0 (GLOVE) IMPLANT
GLOVE BIOGEL PI IND STRL 7.5 (GLOVE) ×3 IMPLANT
GLOVE BIOGEL PI IND STRL 8 (GLOVE) ×6 IMPLANT
GLOVE BIOGEL PI IND STRL 8.5 (GLOVE) ×3 IMPLANT
GLOVE BIOGEL PI INDICATOR 7.0 (GLOVE) ×1
GLOVE BIOGEL PI INDICATOR 7.5 (GLOVE) ×2
GLOVE BIOGEL PI INDICATOR 8 (GLOVE) ×2
GLOVE BIOGEL PI INDICATOR 8.5 (GLOVE) ×1
GLOVE ECLIPSE 7.5 STRL STRAW (GLOVE) ×5 IMPLANT
GLOVE ECLIPSE 8.5 STRL (GLOVE) ×1 IMPLANT
GLOVE EXAM NITRILE LRG STRL (GLOVE) IMPLANT
GLOVE EXAM NITRILE MD LF STRL (GLOVE) ×1 IMPLANT
GLOVE EXAM NITRILE XL STR (GLOVE) IMPLANT
GLOVE EXAM NITRILE XS STR PU (GLOVE) IMPLANT
GLOVE SURG SS PI 6.5 STRL IVOR (GLOVE) ×2 IMPLANT
GLOVE SURG SS PI 8.0 STRL IVOR (GLOVE) ×3 IMPLANT
GOWN BRE IMP SLV AUR LG STRL (GOWN DISPOSABLE) ×1 IMPLANT
GOWN BRE IMP SLV AUR XL STRL (GOWN DISPOSABLE) ×2 IMPLANT
GOWN PREVENTION PLUS XLARGE (GOWN DISPOSABLE) ×4 IMPLANT
GOWN STRL NON-REIN LRG LVL3 (GOWN DISPOSABLE) ×7 IMPLANT
GOWN STRL REIN 2XL LVL4 (GOWN DISPOSABLE) ×2 IMPLANT
GOWN STRL REIN 2XL XLG LVL4 (GOWN DISPOSABLE) ×1 IMPLANT
HANDPIECE INTERPULSE COAX TIP (DISPOSABLE) ×4
KIT BASIN OR (CUSTOM PROCEDURE TRAY) ×8 IMPLANT
KIT ROOM TURNOVER OR (KITS) ×7 IMPLANT
MANIFOLD NEPTUNE II (INSTRUMENTS) ×4 IMPLANT
MEDIUM 1/8" PVC DRAIN ×1 IMPLANT
MIX DBX 10CC 35% BONE (Bone Implant) ×1 IMPLANT
NDL HYPO 25X1 1.5 SAFETY (NEEDLE) ×3 IMPLANT
NEEDLE 22X1 1/2 (OR ONLY) (NEEDLE) ×1 IMPLANT
NEEDLE HYPO 25X1 1.5 SAFETY (NEEDLE) ×4 IMPLANT
NS IRRIG 1000ML POUR BTL (IV SOLUTION) ×7 IMPLANT
PACK LAMINECTOMY NEURO (CUSTOM PROCEDURE TRAY) ×4 IMPLANT
PACK ORTHO EXTREMITY (CUSTOM PROCEDURE TRAY) ×4 IMPLANT
PAD ARMBOARD 7.5X6 YLW CONV (MISCELLANEOUS) ×22 IMPLANT
PADDING CAST COTTON 2X4 NS (CAST SUPPLIES) ×1 IMPLANT
PADDING CAST COTTON 6X4 STRL (CAST SUPPLIES) ×3 IMPLANT
PENCIL BUTTON HOLSTER BLD 10FT (ELECTRODE) ×1 IMPLANT
PLATE LCP 3.5 1/3 TUB 5HX57 (Plate) ×1 IMPLANT
PLATE LCP 3.5 1/3 TUB 7HX81 (Plate) ×1 IMPLANT
PROS LCP PLATE 8H 111M (Plate) ×4 IMPLANT
PROSTHESIS LCP PLATE 8H 111M (Plate) IMPLANT
RETRIEVER SUT HEWSON (MISCELLANEOUS) ×1 IMPLANT
SCREW 50MM (Screw) ×5 IMPLANT
SCREW CORTEX 3.5 10MM (Screw) ×2 IMPLANT
SCREW CORTEX 3.5 14MM (Screw) ×3 IMPLANT
SCREW CORTEX 3.5 16MM (Screw) ×6 IMPLANT
SCREW CORTEX 3.5 18MM (Screw) ×5 IMPLANT
SCREW CORTEX 3.5 20MM (Screw) ×3 IMPLANT
SCREW CORTEX 3.5 22MM (Screw) ×1 IMPLANT
SCREW CORTEX 3.5 24MM (Screw) ×1 IMPLANT
SCREW CORTEX 3.5 26MM (Screw) ×1 IMPLANT
SCREW CORTEX 3.5 30MM (Screw) ×1 IMPLANT
SCREW LOCK CORT ST 3.5X10 (Screw) IMPLANT
SCREW LOCK CORT ST 3.5X14 (Screw) IMPLANT
SCREW LOCK CORT ST 3.5X16 (Screw) IMPLANT
SCREW LOCK CORT ST 3.5X18 (Screw) IMPLANT
SCREW LOCK CORT ST 3.5X20 (Screw) IMPLANT
SCREW LOCK CORT ST 3.5X22 (Screw) IMPLANT
SCREW LOCK CORT ST 3.5X24 (Screw) IMPLANT
SCREW LOCK CORT ST 3.5X26 (Screw) IMPLANT
SCREW LOCK CORT ST 3.5X30 (Screw) IMPLANT
SCREW POLYAX 6.5X45MM (Screw) ×3 IMPLANT
SET HNDPC FAN SPRY TIP SCT (DISPOSABLE) IMPLANT
SPONGE GAUZE 4X4 12PLY (GAUZE/BANDAGES/DRESSINGS) ×7 IMPLANT
SPONGE LAP 18X18 X RAY DECT (DISPOSABLE) ×6 IMPLANT
SPONGE SURGIFOAM ABS GEL SZ50 (HEMOSTASIS) IMPLANT
STAPLER SKIN PROX WIDE 3.9 (STAPLE) ×2 IMPLANT
STAPLER VISISTAT 35W (STAPLE) ×1 IMPLANT
STOCKINETTE IMPERVIOUS 9X36 MD (GAUZE/BANDAGES/DRESSINGS) ×4 IMPLANT
STRIP CLOSURE SKIN 1/2X4 (GAUZE/BANDAGES/DRESSINGS) ×1 IMPLANT
SUCTION FRAZIER TIP 10 FR DISP (SUCTIONS) ×1 IMPLANT
SUT ETHILON 2 0 PSLX (SUTURE) ×2 IMPLANT
SUT PDS AB 1 CT  36 (SUTURE) ×2
SUT PDS AB 1 CT 36 (SUTURE) IMPLANT
SUT PDS AB 2-0 CT1 27 (SUTURE) ×4 IMPLANT
SUT PROLENE 0 SH 30 (SUTURE) ×1 IMPLANT
SUT VIC AB 0 CT1 18XCR BRD8 (SUTURE) ×3 IMPLANT
SUT VIC AB 0 CT1 8-18 (SUTURE) ×4
SUT VIC AB 2-0 CT1 18 (SUTURE) ×5 IMPLANT
SUT VIC AB 2-0 CT1 27 (SUTURE) ×16
SUT VIC AB 2-0 CT1 TAPERPNT 27 (SUTURE) IMPLANT
SUT VIC AB 3-0 SH 8-18 (SUTURE) ×4 IMPLANT
SYR 20CC LL (SYRINGE) ×3 IMPLANT
SYR CONTROL 10ML LL (SYRINGE) ×1 IMPLANT
Set Screw ×8 IMPLANT
TAPE CLOTH SURG 4X10 WHT LF (GAUZE/BANDAGES/DRESSINGS) ×1 IMPLANT
TOWEL OR 17X24 6PK STRL BLUE (TOWEL DISPOSABLE) ×9 IMPLANT
TOWEL OR 17X26 10 PK STRL BLUE (TOWEL DISPOSABLE) ×11 IMPLANT
TUBE ANAEROBIC SPECIMEN COL (MISCELLANEOUS) IMPLANT
TUBE CONNECTING 12X1/4 (SUCTIONS) ×5 IMPLANT
UNDERPAD 30X30 INCONTINENT (UNDERPADS AND DIAPERS) ×5 IMPLANT
WATER STERILE IRR 1000ML POUR (IV SOLUTION) ×7 IMPLANT
YANKAUER SUCT BULB TIP NO VENT (SUCTIONS) ×5 IMPLANT
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
## 2012-05-27 NOTE — Anesthesia Postprocedure Evaluation (Signed)
Anesthesia Post Note  Patient: Dean Bowers [facility]  Procedure(s) Performed: Procedure(s) (LRB): Posterior thoracolumbar fusion (N/A) IRRIGATION AND DEBRIDEMENT EXTREMITY, Revision of stump, and wound vac change (Right) OPEN REDUCTION INTERNAL FIXATION (ORIF) RADIAL FRACTURE (Left)  Anesthesia type: General  Patient location: ICU  Post pain: Pain level controlled  Post assessment: Post-op Vital signs reviewed  Last Vitals:  Filed Vitals:   05/27/12 0600  BP: 117/51  Pulse: 90  Temp:   Resp: 14    Post vital signs: stable  Level of consciousness: Patient remains intubated per anesthesia plan  Complications: No apparent anesthesia complications

## 2012-05-27 NOTE — Preoperative (Signed)
Beta Blockers   Reason not to administer Beta Blockers:Not Applicable, Pt took atenolol at 1037 am on 05/26/2012

## 2012-05-27 NOTE — Anesthesia Preprocedure Evaluation (Addendum)
Anesthesia Evaluation  Patient identified by MRN, date of birth, ID band Patient awake    Reviewed: Allergy & Precautions, H&P , NPO status , Patient's Chart, lab work & pertinent test results, reviewed documented beta blocker date and time   Airway Mallampati: II TM Distance: >3 FB Neck ROM: Full    Dental  (+) Dental Advisory Given   Pulmonary asthma , Current Smoker,    + decreased breath sounds      Cardiovascular hypertension, Pt. on medications and Pt. on home beta blockers Rhythm:Regular Rate:Normal     Neuro/Psych    GI/Hepatic (+)     substance abuse  alcohol use and marijuana use,   Endo/Other  Morbid obesity  Renal/GU      Musculoskeletal   Abdominal (+) + obese,   Peds  Hematology   Anesthesia Other Findings   Reproductive/Obstetrics                          Anesthesia Physical Anesthesia Plan  ASA: III  Anesthesia Plan: General   Post-op Pain Management:    Induction: Intravenous  Airway Management Planned: Oral ETT  Additional Equipment:   Intra-op Plan:   Post-operative Plan: Extubation in OR  Informed Consent: I have reviewed the patients History and Physical, chart, labs and discussed the procedure including the risks, benefits and alternatives for the proposed anesthesia with the patient or authorized representative who has indicated his/her understanding and acceptance.   Dental advisory given  Plan Discussed with: CRNA  Anesthesia Plan Comments:         Anesthesia Quick Evaluation

## 2012-05-27 NOTE — Anesthesia Procedure Notes (Addendum)
Procedure Name: Intubation Date/Time: 05/27/2012 8:31 AM Performed by: Rogelia Boga Pre-anesthesia Checklist: Patient identified, Emergency Drugs available, Suction available, Patient being monitored and Timeout performed Patient Re-evaluated:Patient Re-evaluated prior to inductionOxygen Delivery Method: Circle system utilized Preoxygenation: Pre-oxygenation with 100% oxygen Intubation Type: IV induction Laryngoscope Size: Mac and 4 Grade View: Grade I Tube type: Oral Tube size: 7.5 mm Airway Equipment and Method: Stylet Placement Confirmation: ETT inserted through vocal cords under direct vision,  positive ETCO2 and breath sounds checked- equal and bilateral Secured at: 23 cm Tube secured with: Tape Dental Injury: Teeth and Oropharynx as per pre-operative assessment     RIJ CVP Dual Lumen: 1350-1405: The patient was identified. TO was performed, and full barrier precautions were used.  Once the vein was located with the 22 ga. needle using ultrasound guidance , the wire was inserted into the vein.  The wire location was confirmed with ultrasound.  The tissue was dilated and the catheter was carefully inserted, then sutured in place. A dressing was applied. The patient tolerated the procedure well in OR. CE

## 2012-05-27 NOTE — Consult Note (Signed)
I have seen and examined the patient. I agree with the findings above.  L both bone forearm R traumatic open BKA  I discussed with the patient the risks and benefits of surgery, including the possibility of infection, potential for unequal leg lengths because of amputation level, skin breakdown or wound problems with prosthetic wear, nerve injury, vessel injury, wound breakdown, arthritis, symptomatic hardware, DVT/ PE, loss of motion, and need for further surgery among others.  We also specifically discussed the possibility of further staging because of the amputation.  He understands these risks and wishes to proceed.   Budd Palmer, MD 05/27/2012 8:20 AM

## 2012-05-27 NOTE — Brief Op Note (Signed)
05/25/2012 - 05/27/2012  1:36 PM  PATIENT:  Dean Bowers  42 y.o. male  PRE-OPERATIVE DIAGNOSIS:  Left ulnar and radial fractures, open BKA, T12 Chance fracture  POST-OPERATIVE DIAGNOSIS:  Left ulnar and radial fractures, open BKA, T12 Chance fracture  PROCEDURE:   1. ORIF left segmental ulna and radius fractures 2. R formal BKA 3. Application of wound vac  Please separate procedure and dictation by Dr. Venetia Maxon  SURGEON:  Surgeon(s) and Role: Panel 1:    * Budd Palmer, MD - Primary Panel 2:    * Maeola Harman, MD - Primary   PHYSICIAN ASSISTANT: Montez Morita, [facility]  ANESTHESIA:   general  EBL:  Total I/O In: 2250 [I.V.:2000; IV Piggyback:250] Out: 960 [Urine:600; Drains:360]  BLOOD ADMINISTERED:none  DRAINS: wound vac   LOCAL MEDICATIONS USED:  MARCAINE     SPECIMEN:  No Specimen  DISPOSITION OF SPECIMEN:  N/A  COUNTS:  YES  TOURNIQUET:  * No tourniquets in log *  DICTATION: .Other Dictation: Dictation Number [id number]  PLAN OF CARE: Admit to inpatient   PATIENT DISPOSITION:  remaining in OR for Dr. Fredrich Birks PSIF   Delay start of Pharmacological VTE agent (>24hrs) due to surgical blood loss or risk of bleeding: per Dr. Venetia Maxon

## 2012-05-27 NOTE — Transfer of Care (Signed)
Immediate Anesthesia Transfer of Care Note  Patient: Dean Bowers  Procedure(s) Performed: Procedure(s) with comments: Posterior thoracolumbar fusion (N/A) - Posterior Thoracic Eleven-Lumbar Two Fusion IRRIGATION AND DEBRIDEMENT EXTREMITY, Revision of stump, and wound vac change (Right) OPEN REDUCTION INTERNAL FIXATION (ORIF) RADIAL FRACTURE (Left)  Patient Location: NICU  Anesthesia Type:General  Level of Consciousness: sedated, unresponsive and Patient remains intubated per anesthesia plan  Airway & Oxygen Therapy: Patient remains intubated per anesthesia plan and Patient placed on Ventilator (see vital sign flow sheet for setting)  Post-op Assessment: Report given to PACU RN and Post -op Vital signs reviewed and stable  Post vital signs: Reviewed and stable  Complications: No apparent anesthesia complications

## 2012-05-27 NOTE — Addendum Note (Signed)
Addendum created 05/27/12 2342 by Judie Petit, MD   Modules edited: Anesthesia Blocks and Procedures, Anesthesia LDA

## 2012-05-27 NOTE — Progress Notes (Signed)
CRITICAL VALUE ALERT  Critical value received:  Hgb 6.1  Date of notification: 04 22  Time of notification:2045  Critical value read back:yes  Nurse who received alert: Lavonna Monarch  MD notified (1st page):  Derrell Lolling  Time of first page:  2046  MD notified (2nd page):  Time of second page:  Responding MD: Derrell Lolling  Time MD responded: 2048

## 2012-05-28 ENCOUNTER — Encounter (HOSPITAL_COMMUNITY): Payer: Self-pay | Admitting: Anesthesiology

## 2012-05-28 ENCOUNTER — Inpatient Hospital Stay (HOSPITAL_COMMUNITY): Payer: 59

## 2012-05-28 LAB — CBC
Hemoglobin: 6.7 g/dL — CL (ref 13.0–17.0)
MCH: 28.4 pg (ref 26.0–34.0)
Platelets: 125 10*3/uL — ABNORMAL LOW (ref 150–400)
RBC: 2.36 MIL/uL — ABNORMAL LOW (ref 4.22–5.81)
WBC: 9.9 10*3/uL (ref 4.0–10.5)

## 2012-05-28 LAB — CBC WITH DIFFERENTIAL/PLATELET
Eosinophils Relative: 0 % (ref 0–5)
HCT: 23 % — ABNORMAL LOW (ref 39.0–52.0)
Hemoglobin: 8 g/dL — ABNORMAL LOW (ref 13.0–17.0)
Lymphocytes Relative: 16 % (ref 12–46)
Lymphs Abs: 1.8 10*3/uL (ref 0.7–4.0)
MCH: 28.7 pg (ref 26.0–34.0)
MCV: 82.4 fL (ref 78.0–100.0)
Monocytes Absolute: 1.4 10*3/uL — ABNORMAL HIGH (ref 0.1–1.0)
Monocytes Relative: 12 % (ref 3–12)
Platelets: 125 10*3/uL — ABNORMAL LOW (ref 150–400)
RBC: 2.79 MIL/uL — ABNORMAL LOW (ref 4.22–5.81)
WBC: 11.3 10*3/uL — ABNORMAL HIGH (ref 4.0–10.5)

## 2012-05-28 LAB — BASIC METABOLIC PANEL
CO2: 32 mEq/L (ref 19–32)
Calcium: 7.3 mg/dL — ABNORMAL LOW (ref 8.4–10.5)
Chloride: 103 mEq/L (ref 96–112)
Glucose, Bld: 122 mg/dL — ABNORMAL HIGH (ref 70–99)
Potassium: 4.4 mEq/L (ref 3.5–5.1)
Sodium: 138 mEq/L (ref 135–145)

## 2012-05-28 MED ORDER — SODIUM CHLORIDE 0.9 % IJ SOLN: 9.0000 mL | INTRAMUSCULAR | Status: DC | PRN

## 2012-05-28 MED ORDER — MORPHINE SULFATE (PF) 1 MG/ML IV SOLN
INTRAVENOUS | Status: DC
  Administered 2012-05-28 (×4): via INTRAVENOUS
  Administered 2012-05-29: 47.84 mg via INTRAVENOUS
  Administered 2012-05-29: 11:00:00 via INTRAVENOUS
  Administered 2012-05-29: 17.17 mg via INTRAVENOUS
  Administered 2012-05-29: 07:00:00 via INTRAVENOUS
  Administered 2012-05-29: 15 mg via INTRAVENOUS
  Administered 2012-05-30: 07:00:00 via INTRAVENOUS
  Administered 2012-05-30: 21 mg via INTRAVENOUS
  Filled 2012-05-28 (×8): qty 25

## 2012-05-28 MED ORDER — NALOXONE HCL 0.4 MG/ML IJ SOLN: 0.4000 mg | INTRAMUSCULAR | Status: DC | PRN

## 2012-05-28 MED ORDER — MORPHINE SULFATE (PF) 1 MG/ML IV SOLN
INTRAVENOUS | Status: AC
  Administered 2012-05-28: 10:00:00
  Filled 2012-05-28: qty 25

## 2012-05-28 MED ORDER — FENTANYL CITRATE 0.05 MG/ML IJ SOLN
INTRAMUSCULAR | Status: AC
  Administered 2012-05-28: 100 ug
  Filled 2012-05-28: qty 4

## 2012-05-28 MED ORDER — DIPHENHYDRAMINE HCL 50 MG/ML IJ SOLN: 12.5000 mg | Freq: Four times a day (QID) | INTRAMUSCULAR | Status: DC | PRN

## 2012-05-28 MED ORDER — DIPHENHYDRAMINE HCL 12.5 MG/5ML PO ELIX
12.5000 mg | ORAL_SOLUTION | Freq: Four times a day (QID) | ORAL | Status: DC | PRN
  Filled 2012-05-28: qty 5

## 2012-05-28 MED ORDER — ONDANSETRON HCL 4 MG/2ML IJ SOLN: 4.0000 mg | Freq: Four times a day (QID) | INTRAMUSCULAR | Status: DC | PRN

## 2012-05-28 NOTE — Progress Notes (Signed)
45.5 mg morphine cleared from pump history.

## 2012-05-28 NOTE — Progress Notes (Signed)
OT / PT  Cancellation Note  Patient Details Name: Dean Bowers MRN: [id number] DOB: 08/23/1970   Cancelled Treatment:    Reason Eval/Treat Not Completed: Patient not medically ready (hgb 6.7 pending transfusion- calling MD to incr activity orders as appropriate)  Lucile Shutters Pager: [phone]  05/28/2012, 10:15 AM

## 2012-05-28 NOTE — Progress Notes (Signed)
As per OT note will hold until later today and transfusion underway.  Spoke with MD who is changing activity orders. [place], [place] [phone] 05/28/2012
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
## 2012-05-28 NOTE — Progress Notes (Signed)
05/28/2012- 1610- Pt suctioned and extubated to 5lpm cannula.  Pt tolerated well with pt vocalizing post extubation.  RN at bedside during procedure.  Vent on s/by at bedside.  Will monitor progress and wean oxygen as tolerated.
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
## 2012-05-28 NOTE — Progress Notes (Signed)
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
## 2012-05-28 NOTE — ED Provider Notes (Signed)
I have personally seen and examined the patient.  I have discussed the plan of care with the resident.  I have reviewed the documentation on PMH/FH/Soc. History.  I have reviewed the documentation of the resident and agree.  Doug Sou, MD 05/28/12 1739

## 2012-05-29 ENCOUNTER — Encounter (HOSPITAL_COMMUNITY): Payer: Self-pay | Admitting: Neurosurgery

## 2012-05-29 DIAGNOSIS — S72409A Unspecified fracture of lower end of unspecified femur, initial encounter for closed fracture: Secondary | ICD-10-CM

## 2012-05-29 DIAGNOSIS — S98919A Complete traumatic amputation of unspecified foot, level unspecified, initial encounter: Secondary | ICD-10-CM

## 2012-05-29 LAB — TYPE AND SCREEN
ABO/RH(D): A POS
Antibody Screen: NEGATIVE
Unit division: 0
Unit division: 0
Unit division: 0
Unit division: 0
Unit division: 0
Unit division: 0

## 2012-05-29 LAB — CBC
HCT: 21.8 % — ABNORMAL LOW (ref 39.0–52.0)
MCH: 28.8 pg (ref 26.0–34.0)
MCV: 83.8 fL (ref 78.0–100.0)
RDW: 14.8 % (ref 11.5–15.5)
WBC: 10.6 10*3/uL — ABNORMAL HIGH (ref 4.0–10.5)

## 2012-05-29 LAB — HEMOGLOBIN AND HEMATOCRIT, BLOOD
HCT: 22.7 % — ABNORMAL LOW (ref 39.0–52.0)
Hemoglobin: 7.6 g/dL — ABNORMAL LOW (ref 13.0–17.0)

## 2012-05-29 MED ORDER — ENOXAPARIN SODIUM 30 MG/0.3ML ~~LOC~~ SOLN
30.0000 mg | Freq: Two times a day (BID) | SUBCUTANEOUS | Status: DC
  Administered 2012-05-29 – 2012-05-30 (×3): 30 mg via SUBCUTANEOUS
  Filled 2012-05-29 (×4): qty 0.3

## 2012-05-29 MED ORDER — POLYETHYLENE GLYCOL 3350 17 G PO PACK
17.0000 g | PACK | Freq: Every day | ORAL | Status: DC
  Administered 2012-05-29 – 2012-05-30 (×2): 17 g via ORAL
  Filled 2012-05-29 (×2): qty 1

## 2012-05-29 MED ORDER — DOCUSATE SODIUM 100 MG PO CAPS
100.0000 mg | ORAL_CAPSULE | Freq: Every day | ORAL | Status: DC
  Administered 2012-05-29 – 2012-05-30 (×2): 100 mg via ORAL
  Filled 2012-05-29 (×2): qty 1

## 2012-05-29 MED ORDER — PANTOPRAZOLE SODIUM 40 MG PO TBEC
40.0000 mg | DELAYED_RELEASE_TABLET | Freq: Every day | ORAL | Status: DC
  Administered 2012-05-29 – 2012-05-30 (×2): 40 mg via ORAL
  Filled 2012-05-29: qty 1

## 2012-05-29 MED ORDER — OXYCODONE HCL 5 MG PO TABS: 5.0000 mg | ORAL_TABLET | ORAL | Status: DC | PRN

## 2012-05-29 NOTE — Progress Notes (Signed)
Orthopaedic Trauma Service (OTS)  Subjective: 2 Days Post-Op ORIF L both bone, R BKA Patient reports pain as severe. A&O x 4, conversant.  Objective: Current Vitals Blood pressure 115/55, pulse 101, temperature 99.1 F (37.3 C), temperature source Core (Comment), resp. rate 19, height 5\' 11"  (1.803 m), weight 308 lb 10.3 oz (140 kg), SpO2 100.00%. Vital signs in last 24 hours: Temp:  [99 F (37.2 C)-100.4 F (38 C)] 99.1 F (37.3 C) (04/24 0800) Pulse Rate:  [101-123] 101 (04/24 0800) Resp:  [12-32] 19 (04/24 0800) BP: (103-144)/(45-97) 115/55 mmHg (04/24 0800) SpO2:  [88 %-100 %] 100 % (04/24 0800)  Intake/Output from previous day: 04/23 0701 - 04/24 0700 In: 3786.7 [I.V.:3576.7; IV Piggyback:210] Out: 2685 [Urine:2600; Drains:85]  LABS  Recent Labs  05/27/12 2012 05/28/12 0400 05/28/12 1555 05/29/12 0545  HGB 6.1* 6.7* 8.0* 7.5*    Recent Labs  05/28/12 1555 05/29/12 0545  WBC 11.3* 10.6*  RBC 2.79* 2.60*  HCT 23.0* 21.8*  PLT 125* 141*    Recent Labs  05/27/12 2012 05/28/12 0400  NA 136 138  K 4.6 4.4  CL 101 103  CO2 32 32  BUN 12 10  CREATININE 0.63 0.58  GLUCOSE 138* 122*  CALCIUM 7.5* 7.3*    Recent Labs  05/28/12 1200  INR 1.07     Physical Exam  UEx elbow excellent motion, full digital ROM  Sens  Ax/R/M/U intact  Mot    R/ PIN/ M/ AIN/ U intact  Brisk CR RLE no drainage, drsg clean and intact, immobilizer in place    Assessment/Plan: 2 Days Post-Op  Mobilize to chair as able WBAT thru L elbow, NWB wrist Will maintain vac for now; begin AROM of the knee  05/29/2012, 9:00 AM

## 2012-05-29 NOTE — Transfer of Care (Signed)
Immediate Anesthesia Transfer of Care Note  Patient: Dean Bowers [facility]  Procedure(s) Performed: Procedure(s) with comments: AMPUTATION BELOW KNEE (Right) INTRAMEDULLARY (IM) NAIL FEMORAL  (Right) CAST APPLICATION (Left) - long arm cast application  Patient Location: PACU  Anesthesia Type:General  Level of Consciousness: sedated  Airway & Oxygen Therapy: Patient Spontanous Breathing  Post-op Assessment: Report given to PACU RN  Post vital signs: Reviewed  Complications: No apparent anesthesia complications

## 2012-05-29 NOTE — Progress Notes (Signed)
Rehab Admissions Coordinator Note:  Patient was screened by Clois Dupes for appropriateness for an Inpatient Acute Rehab Consult.  At this time, we are recommending Inpatient Rehab consult.  Clois Dupes 05/29/2012, 7:56 AM  I can be reached at [phone].

## 2012-05-29 NOTE — Progress Notes (Signed)
Subjective: Patient reports "I'm ok I guess. I hurt in my leg & back"  Objective: Vital signs in last 24 hours: Temp:  [99 F (37.2 C)-100.4 F (38 C)] 99 F (37.2 C) (04/24 0700) Pulse Rate:  [102-123] 109 (04/24 0700) Resp:  [12-49] 16 (04/24 0722) BP: (103-144)/(45-97) 110/49 mmHg (04/24 0700) SpO2:  [88 %-100 %] 98 % (04/24 0722) FiO2 (%):  [39.9 %-40 %] 40 % (04/23 0900)  Intake/Output from previous day: 04/23 0701 - 04/24 0700 In: 3786.7 [I.V.:3576.7; IV Piggyback:210] Out: 2685 [Urine:2600; Drains:85] Intake/Output this shift:    Alert, conversant. Moving all extremities. Hemovac ~44ml overnight.  Lab Results:  Recent Labs  05/28/12 1555 05/29/12 0545  WBC 11.3* 10.6*  HGB 8.0* 7.5*  HCT 23.0* 21.8*  PLT 125* 141*   BMET  Recent Labs  05/27/12 2012 05/28/12 0400  NA 136 138  K 4.6 4.4  CL 101 103  CO2 32 32  GLUCOSE 138* 122*  BUN 12 10  CREATININE 0.63 0.58  CALCIUM 7.5* 7.3*    Studies/Results: Dg Thoracolumabar Spine  05/27/2012  *RADIOLOGY REPORT*  Clinical Data: T12 chance fracture.  DG C-ARM GT 120 MIN,THORACOLUMBAR SPINE - 2 VIEW  Technique:  C-arm fluoroscopic images were obtained intraoperatively and submitted for postoperative interpretation. Please see the performing provider's procedural report for the fluoroscopy time utilized.  Comparison: CT chest 05/25/2012  Findings:  AP and lateral C-arm films document placement of pedicle screws at T11, T12, L1, and L2 in preparation for fusion.  Mild wedging T12.  IMPRESSION: T11-L2 fusion.   Original Report Authenticated By: Davonna Belling, M.D.    Dg Forearm Left  05/27/2012  *RADIOLOGY REPORT*  Clinical Data: Fractures of the radius and ulna.  LEFT FOREARM - 2 VIEW  Comparison: Radiographs dated 05/26/2012  Findings: Multiple c-arm images demonstrate the patient has undergone open reduction and internal fixation of the fractures of the radius and ulna.  Side plate and screws are in place and have  restored the bones to near anatomic alignment and position.  IMPRESSION: Open reduction and internal fixation of the radius and ulna fractures.   Original Report Authenticated By: Francene Boyers, M.D.    Dg Forearm Left  05/27/2012  *RADIOLOGY REPORT*  Clinical Data: Fractures of the left radius and ulna.  LEFT FOREARM - 2 VIEW  Comparison: Radiographs dated 05/26/2012  Findings: The patient has undergone open reduction and internal fixation of the multiple fractures of the ulna and of the midshaft fracture of the radius.  Side plates and screws have been inserted. Alignment and position of the major fragments is anatomic.  IMPRESSION: Open reduction and internal fixation of fractures as described.   Original Report Authenticated By: Francene Boyers, M.D.    Dg Lumbar Spine 1 View  05/27/2012  *RADIOLOGY REPORT*  Clinical Data: Posterior thoracolumbar fusion.  The T12 chance fracture.  LUMBAR SPINE - 1 VIEW  Comparison: CT abdomen and pelvis 05/25/2012.  Findings: AP portable radiograph was obtained for localization of T12.  Multiple radiopaque markers overlie the thoracolumbar junction;l although the lowermost extent of the lumbar spine is not included on this radiograph, I believe radiopaque markers outline the T11, L1, and L3 levels.  IMPRESSION: AP localizer film as described.  Recommend additional localization with cross-table lateral prior to instrumentation.   Original Report Authenticated By: Davonna Belling, M.D.    Dg Chest Port 1 View  05/28/2012  *RADIOLOGY REPORT*  Clinical Data: Check endotracheal tube  PORTABLE CHEST - 1 VIEW  Comparison: Chest radiograph 05/27/2012  Findings: Endotracheal tube, NG tube, and right central venous line are unchanged.  Stable enlarged heart silhouette.  Mild interstitial edema pattern.  IMPRESSION: 1.  Stable support apparatus. 2.  Mild interstitial edema.   Original Report Authenticated By: Genevive Bi, M.D.    Dg Chest Portable 1 View  05/27/2012  *RADIOLOGY  REPORT*  Clinical Data: Central line placement  PORTABLE CHEST - 1 VIEW  Comparison: CT chest dated 05/25/2012  Findings: Endotracheal tube terminates 4.5 cm above the carina.  Right IJ venous catheter terminates in the lower SVC.  No pneumothorax.  Enteric tube terminates in the proximal stomach with its side port likely within a hiatal hernia.  The lungs are essentially clear.  Mild cardiomegaly.  IMPRESSION: Right IJ venous catheter terminates in the lower SVC.  No pneumothorax.  Endotracheal tube terminates 4.5 cm above the carina.  These results were called by telephone to the OR on 05/27/2012 at 1407 hours.   Original Report Authenticated By: Charline Bills, M.D.    Dg Abd Portable 1v  05/27/2012  *RADIOLOGY REPORT*  Clinical Data: Gastric tube placement.  PORTABLE ABDOMEN - 1 VIEW  Comparison: Radiographs dated 05/27/2012 at 2:08 p.m.  Findings: NG tube has been inserted and the tip is in the distal antrum.  There is a moderate amount of air in the ascending and transverse portions of the colon.  No dilated bowel.  Minimal atelectasis at the right lung base medially.  The patient has undergone posterior fusion at T11-L2.  IMPRESSION: NG tube in good position.  Benign-appearing abdomen.   Original Report Authenticated By: Francene Boyers, M.D.    Dg C-arm Gt 120 Min  05/27/2012  *RADIOLOGY REPORT*  Clinical Data: T12 chance fracture.  DG C-ARM GT 120 MIN,THORACOLUMBAR SPINE - 2 VIEW  Technique:  C-arm fluoroscopic images were obtained intraoperatively and submitted for postoperative interpretation. Please see the performing provider's procedural report for the fluoroscopy time utilized.  Comparison: CT chest 05/25/2012  Findings:  AP and lateral C-arm films document placement of pedicle screws at T11, T12, L1, and L2 in preparation for fusion.  Mild wedging T12.  IMPRESSION: T11-L2 fusion.   Original Report Authenticated By: Davonna Belling, M.D.     Assessment/Plan:   LOS: 4 days  Per Dr. Venetia Maxon, will  pull Hemovac. OK to mobilize from NS standpoint. Ok to transfer & start Lovenox when Trauma deems appropriate.   Georgiann Cocker 05/29/2012, 7:52 AM    Good LE function.  Pain improving this AM.  OK for Rehab in near future.  OK for low dose lovenox.

## 2012-05-29 NOTE — Progress Notes (Signed)
Pt admitted to 3300 from 3100. Oriented to unit and room. Safety discussed and call bell with in reach. VSS. Wound vac to right leg.   Elijah Birk, RN

## 2012-05-29 NOTE — Progress Notes (Signed)
2 Days Post-Op  Subjective: Better pain control, no nausea  Objective: Vital signs in last 24 hours: Temp:  [99 F (37.2 C)-100.4 F (38 C)] 99 F (37.2 C) (04/24 0700) Pulse Rate:  [102-123] 109 (04/24 0700) Resp:  [12-49] 16 (04/24 0722) BP: (103-144)/(45-97) 110/49 mmHg (04/24 0700) SpO2:  [88 %-100 %] 98 % (04/24 0722) FiO2 (%):  [39.9 %-40 %] 40 % (04/23 0900)    Intake/Output from previous day: 04/23 0701 - 04/24 0700 In: 3786.7 [I.V.:3576.7; IV Piggyback:210] Out: 2685 [Urine:2600; Drains:85] Intake/Output this shift:    General appearance: alert and cooperative Resp: clear to auscultation bilaterally Chest wall: no tenderness Cardio: regular rate and rhythm GI: soft, nontender, nondistended, positive bowel sounds Extremities: right lower extremity stump covered by knee immobilizer, left toes since moving, left forearm splint with fingers warm and mobile neuro: Oriented and follows commands  Lab Results: CBC   Recent Labs  05/28/12 1555 05/29/12 0545  WBC 11.3* 10.6*  HGB 8.0* 7.5*  HCT 23.0* 21.8*  PLT 125* 141*   BMET  Recent Labs  05/27/12 2012 05/28/12 0400  NA 136 138  K 4.6 4.4  CL 101 103  CO2 32 32  GLUCOSE 138* 122*  BUN 12 10  CREATININE 0.63 0.58  CALCIUM 7.5* 7.3*   PT/INR  Recent Labs  05/28/12 1200  LABPROT 13.8  INR 1.07   ABG No results found for this basename: PHART, PCO2, PO2, HCO3,  in the last 72 hours  Studies/Results: Dg Thoracolumabar Spine  05/27/2012  *RADIOLOGY REPORT*  Clinical Data: T12 chance fracture.  DG C-ARM GT 120 MIN,THORACOLUMBAR SPINE - 2 VIEW  Technique:  C-arm fluoroscopic images were obtained intraoperatively and submitted for postoperative interpretation. Please see the performing provider's procedural report for the fluoroscopy time utilized.  Comparison: CT chest 05/25/2012  Findings:  AP and lateral C-arm films document placement of pedicle screws at T11, T12, L1, and L2 in preparation for  fusion.  Mild wedging T12.  IMPRESSION: T11-L2 fusion.   Original Report Authenticated By: Davonna Belling, M.D.    Dg Forearm Left  05/27/2012  *RADIOLOGY REPORT*  Clinical Data: Fractures of the radius and ulna.  LEFT FOREARM - 2 VIEW  Comparison: Radiographs dated 05/26/2012  Findings: Multiple c-arm images demonstrate the patient has undergone open reduction and internal fixation of the fractures of the radius and ulna.  Side plate and screws are in place and have restored the bones to near anatomic alignment and position.  IMPRESSION: Open reduction and internal fixation of the radius and ulna fractures.   Original Report Authenticated By: Francene Boyers, M.D.    Dg Forearm Left  05/27/2012  *RADIOLOGY REPORT*  Clinical Data: Fractures of the left radius and ulna.  LEFT FOREARM - 2 VIEW  Comparison: Radiographs dated 05/26/2012  Findings: The patient has undergone open reduction and internal fixation of the multiple fractures of the ulna and of the midshaft fracture of the radius.  Side plates and screws have been inserted. Alignment and position of the major fragments is anatomic.  IMPRESSION: Open reduction and internal fixation of fractures as described.   Original Report Authenticated By: Francene Boyers, M.D.    Dg Lumbar Spine 1 View  05/27/2012  *RADIOLOGY REPORT*  Clinical Data: Posterior thoracolumbar fusion.  The T12 chance fracture.  LUMBAR SPINE - 1 VIEW  Comparison: CT abdomen and pelvis 05/25/2012.  Findings: AP portable radiograph was obtained for localization of T12.  Multiple radiopaque markers overlie the thoracolumbar junction;l  although the lowermost extent of the lumbar spine is not included on this radiograph, I believe radiopaque markers outline the T11, L1, and L3 levels.  IMPRESSION: AP localizer film as described.  Recommend additional localization with cross-table lateral prior to instrumentation.   Original Report Authenticated By: Davonna Belling, M.D.    Dg Chest Port 1  View  05/28/2012  *RADIOLOGY REPORT*  Clinical Data: Check endotracheal tube  PORTABLE CHEST - 1 VIEW  Comparison: Chest radiograph 05/27/2012  Findings: Endotracheal tube, NG tube, and right central venous line are unchanged.  Stable enlarged heart silhouette.  Mild interstitial edema pattern.  IMPRESSION: 1.  Stable support apparatus. 2.  Mild interstitial edema.   Original Report Authenticated By: Genevive Bi, M.D.    Dg Chest Portable 1 View  05/27/2012  *RADIOLOGY REPORT*  Clinical Data: Central line placement  PORTABLE CHEST - 1 VIEW  Comparison: CT chest dated 05/25/2012  Findings: Endotracheal tube terminates 4.5 cm above the carina.  Right IJ venous catheter terminates in the lower SVC.  No pneumothorax.  Enteric tube terminates in the proximal stomach with its side port likely within a hiatal hernia.  The lungs are essentially clear.  Mild cardiomegaly.  IMPRESSION: Right IJ venous catheter terminates in the lower SVC.  No pneumothorax.  Endotracheal tube terminates 4.5 cm above the carina.  These results were called by telephone to the OR on 05/27/2012 at 1407 hours.   Original Report Authenticated By: Charline Bills, M.D.    Dg Abd Portable 1v  05/27/2012  *RADIOLOGY REPORT*  Clinical Data: Gastric tube placement.  PORTABLE ABDOMEN - 1 VIEW  Comparison: Radiographs dated 05/27/2012 at 2:08 p.m.  Findings: NG tube has been inserted and the tip is in the distal antrum.  There is a moderate amount of air in the ascending and transverse portions of the colon.  No dilated bowel.  Minimal atelectasis at the right lung base medially.  The patient has undergone posterior fusion at T11-L2.  IMPRESSION: NG tube in good position.  Benign-appearing abdomen.   Original Report Authenticated By: Francene Boyers, M.D.    Dg C-arm Gt 120 Min  05/27/2012  *RADIOLOGY REPORT*  Clinical Data: T12 chance fracture.  DG C-ARM GT 120 MIN,THORACOLUMBAR SPINE - 2 VIEW  Technique:  C-arm fluoroscopic images were  obtained intraoperatively and submitted for postoperative interpretation. Please see the performing provider's procedural report for the fluoroscopy time utilized.  Comparison: CT chest 05/25/2012  Findings:  AP and lateral C-arm films document placement of pedicle screws at T11, T12, L1, and L2 in preparation for fusion.  Mild wedging T12.  IMPRESSION: T11-L2 fusion.   Original Report Authenticated By: Davonna Belling, M.D.     Anti-infectives: Anti-infectives   Start     Dose/Rate Route Frequency Ordered Stop   05/27/12 2200  ceFAZolin (ANCEF) IVPB 1 g/50 mL premix     1 g 100 mL/hr over 30 Minutes Intravenous 3 times per day 05/27/12 1712 05/29/12 2159   05/27/12 1715  ceFAZolin (ANCEF) IVPB 1 g/50 mL premix  Status:  Discontinued     1 g 100 mL/hr over 30 Minutes Intravenous Every 8 hours 05/27/12 1712 05/27/12 1715   05/27/12 0817  ceFAZolin (ANCEF) 2-3 GM-% IVPB SOLR  Status:  Discontinued    Comments:  MUELLER, TOM: cabinet override      05/27/12 0817 05/27/12 1715   05/26/12 1400  ceFAZolin (ANCEF) IVPB 1 g/50 mL premix  Status:  Discontinued     1 g 100  mL/hr over 30 Minutes Intravenous 3 times per day 05/26/12 0950 05/27/12 1712   05/26/12 0400  ceFAZolin (ANCEF) IVPB 1 g/50 mL premix  Status:  Discontinued     1 g 100 mL/hr over 30 Minutes Intravenous Every 8 hours 05/26/12 0349 05/26/12 0355   05/26/12 0400  ceFAZolin (ANCEF) IVPB 2 g/50 mL premix     2 g 100 mL/hr over 30 Minutes Intravenous Every 6 hours 05/26/12 0156 05/26/12 0947   05/25/12 1715  ceFAZolin (ANCEF) IVPB 2 g/50 mL premix     2 g 100 mL/hr over 30 Minutes Intravenous  Once 05/25/12 1715 05/25/12 1852      Assessment/Plan: s/p Procedure(s): Posterior thoracolumbar fusion IRRIGATION AND DEBRIDEMENT EXTREMITY, Revision of stump, and wound vac change OPEN REDUCTION INTERNAL FIXATION (ORIF) RADIAL FRACTURE [facility] Traumatic amputation right foot s/p I&D -- S/P repeat I&D by Handy Right femur fx s/p IM nail Resp  - stable since extubation T12 chance fx -- S/Pposterior fixation  by Dr. Venetia Maxon Left BB FA fx -- S/P ORIF by Dr. Carola Frost ABL anemia -- Down slightly, recheck at 1600 PSA Obesity VTE -- Start Lovenox today PT/OT CIR consult To 3300  LOS: 4 days    Violeta Gelinas, MD, MPH, FACS Pager: [phone]

## 2012-05-29 NOTE — Progress Notes (Signed)
Patient unable to void, bladder scan showed 450cc. In and out cath and got 700cc of amber urine. Will continue to monitor.

## 2012-05-29 NOTE — Progress Notes (Signed)
Rehab admissions - Evaluated for possible admission.  I have called and faxed information to [facility] requesting acute inpatient rehab admission.  I will follow up in am.  Call me for questions.  #[phone]
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
## 2012-05-30 ENCOUNTER — Encounter (HOSPITAL_COMMUNITY): Payer: Self-pay | Admitting: Emergency Medicine

## 2012-05-30 ENCOUNTER — Inpatient Hospital Stay (HOSPITAL_COMMUNITY)
Admission: RE | Admit: 2012-05-30 | Discharge: 2012-06-13 | DRG: 945 | Disposition: A | Discharge status: Home Health | Payer: 59 | Source: Intra-hospital | Attending: Physical Medicine & Rehabilitation | Admitting: Physical Medicine & Rehabilitation

## 2012-05-30 DIAGNOSIS — T4275XA Adverse effect of unspecified antiepileptic and sedative-hypnotic drugs, initial encounter: Secondary | ICD-10-CM

## 2012-05-30 DIAGNOSIS — S22009A Unspecified fracture of unspecified thoracic vertebra, initial encounter for closed fracture: Secondary | ICD-10-CM

## 2012-05-30 DIAGNOSIS — F1910 Other psychoactive substance abuse, uncomplicated: Secondary | ICD-10-CM | POA: Insufficient documentation

## 2012-05-30 DIAGNOSIS — D62 Acute posthemorrhagic anemia: Secondary | ICD-10-CM

## 2012-05-30 DIAGNOSIS — F1210 Cannabis abuse, uncomplicated: Secondary | ICD-10-CM

## 2012-05-30 DIAGNOSIS — S98919A Complete traumatic amputation of unspecified foot, level unspecified, initial encounter: Secondary | ICD-10-CM

## 2012-05-30 DIAGNOSIS — Z981 Arthrodesis status: Secondary | ICD-10-CM

## 2012-05-30 DIAGNOSIS — Y9210 Unspecified residential institution as the place of occurrence of the external cause: Secondary | ICD-10-CM

## 2012-05-30 DIAGNOSIS — F17200 Nicotine dependence, unspecified, uncomplicated: Secondary | ICD-10-CM

## 2012-05-30 DIAGNOSIS — G547 Phantom limb syndrome without pain: Secondary | ICD-10-CM

## 2012-05-30 DIAGNOSIS — Z9889 Other specified postprocedural states: Secondary | ICD-10-CM

## 2012-05-30 DIAGNOSIS — L02419 Cutaneous abscess of limb, unspecified: Secondary | ICD-10-CM

## 2012-05-30 DIAGNOSIS — Z5189 Encounter for other specified aftercare: Principal | ICD-10-CM

## 2012-05-30 DIAGNOSIS — I10 Essential (primary) hypertension: Secondary | ICD-10-CM

## 2012-05-30 DIAGNOSIS — L03119 Cellulitis of unspecified part of limb: Secondary | ICD-10-CM

## 2012-05-30 DIAGNOSIS — S72409A Unspecified fracture of lower end of unspecified femur, initial encounter for closed fracture: Secondary | ICD-10-CM

## 2012-05-30 DIAGNOSIS — S5292XA Unspecified fracture of left forearm, initial encounter for closed fracture: Secondary | ICD-10-CM

## 2012-05-30 DIAGNOSIS — Z79899 Other long term (current) drug therapy: Secondary | ICD-10-CM

## 2012-05-30 DIAGNOSIS — R339 Retention of urine, unspecified: Secondary | ICD-10-CM | POA: Clinically undetermined

## 2012-05-30 DIAGNOSIS — F411 Generalized anxiety disorder: Secondary | ICD-10-CM

## 2012-05-30 DIAGNOSIS — T8789 Other complications of amputation stump: Secondary | ICD-10-CM

## 2012-05-30 DIAGNOSIS — H538 Other visual disturbances: Secondary | ICD-10-CM

## 2012-05-30 DIAGNOSIS — W19XXXA Unspecified fall, initial encounter: Secondary | ICD-10-CM

## 2012-05-30 DIAGNOSIS — S22089A Unspecified fracture of T11-T12 vertebra, initial encounter for closed fracture: Secondary | ICD-10-CM

## 2012-05-30 DIAGNOSIS — J45909 Unspecified asthma, uncomplicated: Secondary | ICD-10-CM | POA: Diagnosis present

## 2012-05-30 DIAGNOSIS — S5290XA Unspecified fracture of unspecified forearm, initial encounter for closed fracture: Secondary | ICD-10-CM

## 2012-05-30 DIAGNOSIS — F19951 Other psychoactive substance use, unspecified with psychoactive substance-induced psychotic disorder with hallucinations: Secondary | ICD-10-CM

## 2012-05-30 DIAGNOSIS — K5900 Constipation, unspecified: Secondary | ICD-10-CM

## 2012-05-30 DIAGNOSIS — S52202A Unspecified fracture of shaft of left ulna, initial encounter for closed fracture: Secondary | ICD-10-CM

## 2012-05-30 DIAGNOSIS — S88119A Complete traumatic amputation at level between knee and ankle, unspecified lower leg, initial encounter: Secondary | ICD-10-CM

## 2012-05-30 LAB — CBC
HCT: 23.3 % — ABNORMAL LOW (ref 39.0–52.0)
Hemoglobin: 7.5 g/dL — ABNORMAL LOW (ref 13.0–17.0)
Hemoglobin: 7.8 g/dL — ABNORMAL LOW (ref 13.0–17.0)
MCH: 28.8 pg (ref 26.0–34.0)
MCH: 28.5 pg (ref 26.0–34.0)
MCHC: 33.9 g/dL (ref 30.0–36.0)
MCV: 85 fL (ref 78.0–100.0)
Platelets: 164 10*3/uL (ref 150–400)
RBC: 2.74 MIL/uL — ABNORMAL LOW (ref 4.22–5.81)
RDW: 14.1 % (ref 11.5–15.5)

## 2012-05-30 LAB — CREATININE, SERUM: Creatinine, Ser: 0.4 mg/dL — ABNORMAL LOW (ref 0.50–1.35)

## 2012-05-30 LAB — BASIC METABOLIC PANEL
Calcium: 7.9 mg/dL — ABNORMAL LOW (ref 8.4–10.5)
GFR calc Af Amer: >90 mL/min (ref >90)
GFR calc non Af Amer: >90 mL/min (ref >90)
Glucose, Bld: 174 mg/dL — ABNORMAL HIGH (ref 70–99)
Potassium: 4.4 mEq/L (ref 3.5–5.1)
Sodium: 136 mEq/L (ref 135–145)

## 2012-05-30 MED ORDER — SENNOSIDES-DOCUSATE SODIUM 8.6-50 MG PO TABS
1.0000 | ORAL_TABLET | Freq: Every evening | ORAL | Status: DC | PRN
  Administered 2012-06-02: 1 via ORAL
  Filled 2012-05-30: qty 1

## 2012-05-30 MED ORDER — TAMSULOSIN HCL 0.4 MG PO CAPS
0.4000 mg | ORAL_CAPSULE | Freq: Every day | ORAL | Status: DC
Start: 2012-05-30 — End: 2012-05-30
  Administered 2012-05-30: 0.4 mg via ORAL
  Filled 2012-05-30: qty 1

## 2012-05-30 MED ORDER — OXYCODONE HCL 5 MG PO TABS: 10.0000 mg | ORAL_TABLET | ORAL | Status: DC | PRN

## 2012-05-30 MED ORDER — METHOCARBAMOL 500 MG PO TABS
500.0000 mg | ORAL_TABLET | Freq: Four times a day (QID) | ORAL | Status: DC | PRN
  Administered 2012-05-30 – 2012-06-13 (×23): 500 mg via ORAL
  Filled 2012-05-30 (×24): qty 1

## 2012-05-30 MED ORDER — SODIUM CHLORIDE 0.9 % IJ SOLN
INTRAMUSCULAR | Status: AC
  Administered 2012-05-30: 05:00:00
  Filled 2012-05-30: qty 10

## 2012-05-30 MED ORDER — ALBUTEROL SULFATE HFA 108 (90 BASE) MCG/ACT IN AERS: 2.0000 | INHALATION_SPRAY | RESPIRATORY_TRACT | Status: DC | PRN

## 2012-05-30 MED ORDER — PREGABALIN 50 MG PO CAPS
75.0000 mg | ORAL_CAPSULE | Freq: Three times a day (TID) | ORAL | Status: DC
  Administered 2012-05-30 – 2012-05-31 (×3): 75 mg via ORAL
  Filled 2012-05-30: qty 2
  Filled 2012-05-30 (×2): qty 1

## 2012-05-30 MED ORDER — ENOXAPARIN SODIUM 30 MG/0.3ML ~~LOC~~ SOLN
30.0000 mg | Freq: Two times a day (BID) | SUBCUTANEOUS | Status: DC
  Filled 2012-05-30 (×2): qty 0.3

## 2012-05-30 MED ORDER — ACETAMINOPHEN 325 MG PO TABS
325.0000 mg | ORAL_TABLET | ORAL | Status: DC | PRN
  Administered 2012-05-31 – 2012-06-13 (×2): 650 mg via ORAL
  Filled 2012-05-30 (×2): qty 2

## 2012-05-30 MED ORDER — ALBUTEROL SULFATE HFA 108 (90 BASE) MCG/ACT IN AERS
2.0000 | INHALATION_SPRAY | RESPIRATORY_TRACT | Status: DC | PRN
  Filled 2012-05-30: qty 6.7

## 2012-05-30 MED ORDER — SORBITOL 70 % SOLN
30.0000 mL | Freq: Every day | Status: DC | PRN
  Administered 2012-05-30 – 2012-05-31 (×2): 30 mL via ORAL
  Filled 2012-05-30 (×2): qty 30

## 2012-05-30 MED ORDER — BETHANECHOL CHLORIDE 25 MG PO TABS
25.0000 mg | ORAL_TABLET | Freq: Four times a day (QID) | ORAL | Status: DC
  Administered 2012-05-30 – 2012-06-05 (×22): 25 mg via ORAL
  Filled 2012-05-30 (×27): qty 1

## 2012-05-30 MED ORDER — ENOXAPARIN SODIUM 30 MG/0.3ML ~~LOC~~ SOLN
30.0000 mg | Freq: Two times a day (BID) | SUBCUTANEOUS | Status: DC
  Administered 2012-05-30 – 2012-06-13 (×28): 30 mg via SUBCUTANEOUS
  Filled 2012-05-30 (×32): qty 0.3

## 2012-05-30 MED ORDER — TAMSULOSIN HCL 0.4 MG PO CAPS
0.4000 mg | ORAL_CAPSULE | Freq: Every day | ORAL | Status: DC
Start: 2012-05-31 — End: 2012-06-08
  Administered 2012-05-31 – 2012-06-08 (×9): 0.4 mg via ORAL
  Filled 2012-05-30 (×11): qty 1

## 2012-05-30 MED ORDER — MORPHINE SULFATE 2 MG/ML IJ SOLN: 2.0000 mg | INTRAMUSCULAR | Status: DC | PRN

## 2012-05-30 MED ORDER — BETHANECHOL CHLORIDE 25 MG PO TABS
25.0000 mg | ORAL_TABLET | Freq: Four times a day (QID) | ORAL | Status: DC
  Administered 2012-05-30: 25 mg via ORAL
  Filled 2012-05-30 (×3): qty 1

## 2012-05-30 MED ORDER — DOCUSATE SODIUM 100 MG PO CAPS: 200.0000 mg | ORAL_CAPSULE | Freq: Every day | ORAL | Status: DC

## 2012-05-30 MED ORDER — OXYCODONE HCL 5 MG PO TABS
10.0000 mg | ORAL_TABLET | Freq: Once | ORAL | Status: AC
  Administered 2012-05-30: 10 mg via ORAL

## 2012-05-30 MED ORDER — BISACODYL 5 MG PO TBEC
5.0000 mg | DELAYED_RELEASE_TABLET | Freq: Every day | ORAL | Status: DC
  Administered 2012-05-31 – 2012-06-03 (×4): 5 mg via ORAL
  Filled 2012-05-30 (×4): qty 1

## 2012-05-30 MED ORDER — BISACODYL 5 MG PO TBEC: 5.0000 mg | DELAYED_RELEASE_TABLET | Freq: Every day | ORAL | Status: DC

## 2012-05-30 MED ORDER — POLYETHYLENE GLYCOL 3350 17 G PO PACK
17.0000 g | PACK | Freq: Every day | ORAL | Status: DC
  Administered 2012-05-31 – 2012-06-12 (×13): 17 g via ORAL
  Filled 2012-05-30 (×16): qty 1

## 2012-05-30 MED ORDER — OXYCODONE HCL 5 MG PO TABS
10.0000 mg | ORAL_TABLET | ORAL | Status: DC | PRN
  Administered 2012-05-30 (×2): 20 mg via ORAL
  Administered 2012-05-31: 15 mg via ORAL
  Administered 2012-05-31 (×2): 20 mg via ORAL
  Administered 2012-05-31: 15 mg via ORAL
  Administered 2012-06-01: 20 mg via ORAL
  Administered 2012-06-01: 15 mg via ORAL
  Administered 2012-06-01 – 2012-06-04 (×14): 20 mg via ORAL
  Administered 2012-06-04: 10 mg via ORAL
  Administered 2012-06-05 (×4): 20 mg via ORAL
  Administered 2012-06-06: 5 mg via ORAL
  Administered 2012-06-06: 15 mg via ORAL
  Administered 2012-06-06 – 2012-06-08 (×5): 20 mg via ORAL
  Administered 2012-06-08 (×2): 10 mg via ORAL
  Administered 2012-06-09 – 2012-06-11 (×9): 20 mg via ORAL
  Administered 2012-06-11: 10 mg via ORAL
  Administered 2012-06-12: 20 mg via ORAL
  Administered 2012-06-12 (×2): 10 mg via ORAL
  Administered 2012-06-13: 20 mg via ORAL
  Filled 2012-05-30 (×9): qty 4
  Filled 2012-05-30: qty 1
  Filled 2012-05-30: qty 4
  Filled 2012-05-30: qty 3
  Filled 2012-05-30 (×5): qty 4
  Filled 2012-05-30: qty 3
  Filled 2012-05-30 (×3): qty 4
  Filled 2012-05-30: qty 3
  Filled 2012-05-30: qty 2
  Filled 2012-05-30 (×10): qty 4
  Filled 2012-05-30: qty 3
  Filled 2012-05-30: qty 4
  Filled 2012-05-30 (×2): qty 2
  Filled 2012-05-30 (×2): qty 4
  Filled 2012-05-30 (×2): qty 2
  Filled 2012-05-30 (×4): qty 4
  Filled 2012-05-30: qty 2
  Filled 2012-05-30: qty 4
  Filled 2012-05-30: qty 2
  Filled 2012-05-30 (×4): qty 4

## 2012-05-30 MED ORDER — PREGABALIN 50 MG PO CAPS: 75.0000 mg | ORAL_CAPSULE | Freq: Three times a day (TID) | ORAL | Status: DC

## 2012-05-30 MED FILL — Sodium Chloride IV Soln 0.9%: INTRAVENOUS | Qty: 1000 | Status: AC

## 2012-05-30 MED FILL — Heparin Sodium (Porcine) Inj 1000 Unit/ML: INTRAMUSCULAR | Qty: 30 | Status: AC

## 2012-05-30 MED FILL — Sodium Chloride Irrigation Soln 0.9%: Qty: 3000 | Status: AC

## 2012-05-30 NOTE — Progress Notes (Signed)
Subjective: Patient reports "I think I'm doin ok" "My back hurts and my leg hurts, but not as bad"  Objective: Vital signs in last 24 hours: Temp:  [98 F (36.7 C)-98.7 F (37.1 C)] 98 F (36.7 C) (04/25 0750) Pulse Rate:  [91-119] 95 (04/25 0750) Resp:  [13-21] 15 (04/25 0750) BP: (111-146)/(47-94) 131/47 mmHg (04/25 0750) SpO2:  [92 %-100 %] 100 % (04/25 0750)  Intake/Output from previous day: 04/24 0701 - 04/25 0700 In: 2150 [I.V.:2100; IV Piggyback:50] Out: 1675 [Urine:1675] Intake/Output this shift: Total I/O In: -  Out: 975 [Urine:975]  Alert, conversant. Has been working with PT - awaiting equipment to assist with transfers per pt. Thoracic drsg intact, clean & dry.   Lab Results:  Recent Labs  05/29/12 0545 05/29/12 1600 05/30/12 0500  WBC 10.6*  --  11.0*  HGB 7.5* 7.6* 7.5*  HCT 21.8* 22.7* 22.1*  PLT 141*  --  164   BMET  Recent Labs  05/28/12 0400 05/30/12 0500  NA 138 136  K 4.4 4.4  CL 103 98  CO2 32 35*  GLUCOSE 122* 174*  BUN 10 7  CREATININE 0.58 0.41*  CALCIUM 7.3* 7.9*    Studies/Results: No results found.  Assessment/Plan: Improving   LOS: 5 days  Mobilize per Ortho/Trauma. Pt understands he will f/u with Dr. Venetia Maxon in office with x-rays in 3-4 weeks.    Georgiann Cocker 05/30/2012, 9:58 AM

## 2012-05-30 NOTE — Progress Notes (Signed)
Pt tx 4000 per md order out pt rehab, pt VSS, family at  [facility], all questions answered, report called to receiving RN, all questions answered, IV lest in place per protocol
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
## 2012-05-30 NOTE — Progress Notes (Signed)
Rehab admissions - I have authorization from [facility] review for acute inpatient rehab admission.  Bed available and will plan to admit to acute inpatient rehab today.  Call me for questions.  #[phone]

## 2012-05-30 NOTE — Progress Notes (Signed)
Patient examined and I agree with the assessment and plan I also spoke to his family at the bedside. Violeta Gelinas, MD, MPH, FACS Pager: [phone]  05/30/2012 4:23 PM
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
## 2012-05-30 NOTE — Progress Notes (Signed)
Patient ID: Dean Bowers, male   DOB: March 18, 1970, 42 y.o.   MRN: [id number]   LOS: 5 days   Subjective: Having some coughing, takes albuterol at home when this happens. Had to have foley replaced due to urinary retention. Pain controlled on PCA.   Objective: Vital signs in last 24 hours: Temp:  [98 F (36.7 C)-98.7 F (37.1 C)] 98 F (36.7 C) (04/25 0750) Pulse Rate:  [91-119] 95 (04/25 0750) Resp:  [13-21] 15 (04/25 0750) BP: (111-146)/(47-94) 131/47 mmHg (04/25 0750) SpO2:  [94 %-100 %] 100 % (04/25 0750)     Laboratory  CBC  Recent Labs  05/29/12 0545 05/29/12 1600 05/30/12 0500  WBC 10.6*  --  11.0*  HGB 7.5* 7.6* 7.5*  HCT 21.8* 22.7* 22.1*  PLT 141*  --  164   BMET  Recent Labs  05/28/12 0400 05/30/12 0500  NA 138 136  K 4.4 4.4  CL 103 98  CO2 32 35*  GLUCOSE 122* 174*  BUN 10 7  CREATININE 0.58 0.41*  CALCIUM 7.3* 7.9*     Physical Exam General appearance: alert and no distress Resp: wheezes bilaterally Cardio: regular rate and rhythm GI: normal findings: bowel sounds normal and soft, non-tender   Assessment/Plan: [facility]  Traumatic amputation right foot s/p I&D -- S/P repeat I&D by Handy  Right femur fx s/p IM nail  T12 chance fx -- S/Pposterior fixation by Dr. Venetia Maxon  Left BB FA fx -- S/P ORIF by Dr. Carola Frost  ABL anemia -- Stable Asthma -- Add albuterol Urinary retention -- Start urecholine and flomax, voiding trial in a couple of days PSA  Obesity  FEN -- D/C PCA, increase bowel regimen VTE -- SCD's, Lovenox Dispo -- Transfer to CIR if insurance approves or to floor if decision delayed     Freeman Caldron, PA-C Pager: [phone] General Trauma PA Pager: [phone]   05/30/2012
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
## 2012-05-30 NOTE — Discharge Summary (Signed)
Rhema Boyett, MD, MPH, FACS Pager: [phone]  

## 2012-05-31 ENCOUNTER — Inpatient Hospital Stay (HOSPITAL_COMMUNITY): Payer: 59

## 2012-05-31 ENCOUNTER — Inpatient Hospital Stay (HOSPITAL_COMMUNITY): Payer: PRIVATE HEALTH INSURANCE | Admitting: *Deleted

## 2012-05-31 DIAGNOSIS — S88119A Complete traumatic amputation at level between knee and ankle, unspecified lower leg, initial encounter: Secondary | ICD-10-CM

## 2012-05-31 DIAGNOSIS — S7290XA Unspecified fracture of unspecified femur, initial encounter for closed fracture: Secondary | ICD-10-CM

## 2012-05-31 DIAGNOSIS — J95821 Acute postprocedural respiratory failure: Secondary | ICD-10-CM

## 2012-05-31 DIAGNOSIS — S5290XA Unspecified fracture of unspecified forearm, initial encounter for closed fracture: Secondary | ICD-10-CM

## 2012-05-31 DIAGNOSIS — D62 Acute posthemorrhagic anemia: Secondary | ICD-10-CM

## 2012-05-31 DIAGNOSIS — S22009A Unspecified fracture of unspecified thoracic vertebra, initial encounter for closed fracture: Secondary | ICD-10-CM

## 2012-05-31 MED ORDER — FLEET ENEMA 7-19 GM/118ML RE ENEM
1.0000 | ENEMA | Freq: Every day | RECTAL | Status: DC | PRN
  Filled 2012-05-31: qty 1

## 2012-05-31 MED ORDER — OXYCODONE HCL ER 10 MG PO T12A
20.0000 mg | EXTENDED_RELEASE_TABLET | Freq: Two times a day (BID) | ORAL | Status: DC
  Administered 2012-05-31 – 2012-06-13 (×27): 20 mg via ORAL
  Filled 2012-05-31 (×18): qty 2
  Filled 2012-05-31: qty 1
  Filled 2012-05-31 (×9): qty 2
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
## 2012-05-31 NOTE — Progress Notes (Signed)
Patient alert and respond appropriately to questions. Patient does fall asleep during conversations. Family reported patient has been hallucinating at times for a couple of days. No hallucinations noted. Dr Amador Cunas notified. Oxy CR given scheduled this morning. Dr Amador Cunas notified of patient family concerns of the lyrica. New orders received. Patient vitals stable. Continue with plan of care. Patient up to chair for short time with the maxi lift.
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
## 2012-05-31 NOTE — Plan of Care (Signed)
Problem: RH BOWEL ELIMINATION Goal: RH STG MANAGE BOWEL WITH ASSISTANCE STG Manage Bowel with min Assistance.  Outcome: Not Progressing Last document bowel movement 4/20. Patient on scheduled dulcolax tablet and mirilax. To give PRN this evening.   Problem: RH PAIN MANAGEMENT Goal: RH STG PAIN MANAGED AT OR BELOW PT'S PAIN GOAL Outcome: Not Progressing Patient reports pain at 7 or 8 but falls asleep frequently during conversations

## 2012-06-01 ENCOUNTER — Inpatient Hospital Stay (HOSPITAL_COMMUNITY): Payer: 59 | Admitting: *Deleted

## 2012-06-01 MED ORDER — VITAMIN C 500 MG PO TABS
500.0000 mg | ORAL_TABLET | Freq: Every day | ORAL | Status: DC
  Administered 2012-06-01 – 2012-06-13 (×13): 500 mg via ORAL
  Filled 2012-06-01 (×14): qty 1

## 2012-06-01 MED ORDER — ENSURE COMPLETE PO LIQD
237.0000 mL | Freq: Two times a day (BID) | ORAL | Status: DC
  Administered 2012-06-01 – 2012-06-04 (×6): 237 mL via ORAL

## 2012-06-01 MED ORDER — GABAPENTIN 300 MG PO CAPS
300.0000 mg | ORAL_CAPSULE | Freq: Three times a day (TID) | ORAL | Status: DC
  Administered 2012-06-03 – 2012-06-06 (×10): 300 mg via ORAL
  Filled 2012-06-01 (×13): qty 1

## 2012-06-01 MED ORDER — GABAPENTIN 300 MG PO CAPS
300.0000 mg | ORAL_CAPSULE | Freq: Every day | ORAL | Status: AC
  Administered 2012-06-01: 300 mg via ORAL
  Filled 2012-06-01: qty 1

## 2012-06-01 MED ORDER — GABAPENTIN 300 MG PO CAPS
300.0000 mg | ORAL_CAPSULE | Freq: Two times a day (BID) | ORAL | Status: AC
  Administered 2012-06-02 (×2): 300 mg via ORAL
  Filled 2012-06-01 (×2): qty 1
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
## 2012-06-01 NOTE — Progress Notes (Signed)
Occupational Therapy Session Note  Patient Details  Name: Dean Bowers MRN: [id number] Date of Birth: 1970/08/04  Today's Date: 06/01/2012 Time:  -   1000--1120  (70 min) Pain:  9/10 leg on right and back  Short Term Goals: Week 1:  OT Short Term Goal 1 (Week 1): Patient will complete 4 of 4 grooming activities seated in w/c independently OT Short Term Goal 2 (Week 1): Patient will dress UB sitting at EOB with steadying assist OT Short Term Goal 3 (Week 1): Patient will complete stand-pivot transfer, bed <> w/c, with appropriate AE, and max assist X1 OT Short Term Goal 4 (Week 1): Patient will bathe 9/10 body parts, seated in w/c, using AE as needed with setup assist  Skilled Therapeutic Interventions/Progress Updates:    Engaged in therapeutic bathing and dressing supine.  Addressed bed mobility, UE AROM.  Pt.reported high levels of back and leg pain.  Nursing had pre medicated.  Pt. Wanted to use bed pan.  Performed rolling right and left to position bed pan.  Pt. Performed bathing while on bed pan.  Pt. Dressed shirt and pants with rolling and bridging.  Despite pain, pt worked very hard during session.    Therapy Documentation Precautions:  Precautions Precautions: Back Required Braces or Orthoses: Knee Immobilizer - Right Knee Immobilizer - Right: On at all times Spinal Brace: Thoracolumbosacral orthotic Restrictions Weight Bearing Restrictions: Yes LUE Weight Bearing: Weight bearing as tolerated (thru elbow) RLE Weight Bearing: Non weight bearing      Pain: Pain Assessment Pain Assessment: 0-10 Pain Score:   9 Pain Type: Acute pain Pain Location: Back (and right leg) Pain Orientation: Mid Pain Descriptors: Aching Pain Frequency: Constant Pain Onset: On-going Pain Intervention(s): Medication (See eMAR)      See FIM for current functional status  Therapy/Group: Individual Therapy  Humberto Seals 06/01/2012, 10:01 AM

## 2012-06-01 NOTE — Plan of Care (Signed)
Problem: RH BOWEL ELIMINATION Goal: RH STG MANAGE BOWEL WITH ASSISTANCE STG Manage Bowel with min Assistance.  Outcome: Not Progressing Last documented bowel movement on 4/25.

## 2012-06-02 ENCOUNTER — Inpatient Hospital Stay (HOSPITAL_COMMUNITY): Payer: 59 | Admitting: Occupational Therapy

## 2012-06-02 ENCOUNTER — Inpatient Hospital Stay (HOSPITAL_COMMUNITY): Payer: 59

## 2012-06-02 DIAGNOSIS — S72409A Unspecified fracture of lower end of unspecified femur, initial encounter for closed fracture: Secondary | ICD-10-CM

## 2012-06-02 DIAGNOSIS — S98919A Complete traumatic amputation of unspecified foot, level unspecified, initial encounter: Secondary | ICD-10-CM

## 2012-06-02 LAB — CBC WITH DIFFERENTIAL/PLATELET
Basophils Absolute: 0 10*3/uL (ref 0.0–0.1)
Eosinophils Relative: 2 % (ref 0–5)
HCT: 24.1 % — ABNORMAL LOW (ref 39.0–52.0)
Lymphs Abs: 1.8 10*3/uL (ref 0.7–4.0)
MCH: 28.3 pg (ref 26.0–34.0)
MCV: 84.3 fL (ref 78.0–100.0)
Monocytes Absolute: 1.2 10*3/uL — ABNORMAL HIGH (ref 0.1–1.0)
Monocytes Relative: 12 % (ref 3–12)
Neutro Abs: 6.6 10*3/uL (ref 1.7–7.7)
Platelets: 320 10*3/uL (ref 150–400)
RDW: 13.6 % (ref 11.5–15.5)
WBC: 9.8 10*3/uL (ref 4.0–10.5)

## 2012-06-02 LAB — COMPREHENSIVE METABOLIC PANEL
BUN: 11 mg/dL (ref 6–23)
CO2: 35 mEq/L — ABNORMAL HIGH (ref 19–32)
Calcium: 8.3 mg/dL — ABNORMAL LOW (ref 8.4–10.5)
Chloride: 94 mEq/L — ABNORMAL LOW (ref 96–112)
Creatinine, Ser: 0.45 mg/dL — ABNORMAL LOW (ref 0.50–1.35)
GFR calc non Af Amer: >90 mL/min (ref >90)
Total Bilirubin: 0.7 mg/dL (ref 0.3–1.2)

## 2012-06-02 NOTE — Progress Notes (Signed)
PHARMACIST - PHYSICIAN COMMUNICATION DR:   Katherine Roan  CONCERNING:  42yo male on Lovenox 30mg  SQ q12, with BMI > 30.  1.  Recommend changing Lovenox to 75mg  SQ daily  Marisue Humble, PharmD Clinical Pharmacist [facility]

## 2012-06-02 NOTE — Plan of Care (Signed)
Problem: RH PAIN MANAGEMENT Goal: RH STG PAIN MANAGED AT OR BELOW PT'S PAIN GOAL Outcome: Not Progressing <6

## 2012-06-02 NOTE — Progress Notes (Signed)
Occupational Therapy Session Notes  Patient Details  Name: Dean Bowers MRN: [id number] Date of Birth: 02-Oct-1970  Today's Date: 06/02/2012  Short Term Goals: Week 1:  OT Short Term Goal 1 (Week 1): Patient will complete 4 of 4 grooming activities seated in w/c independently OT Short Term Goal 2 (Week 1): Patient will dress UB sitting at EOB with steadying assist OT Short Term Goal 3 (Week 1): Patient will complete stand-pivot transfer, bed <> w/c, with appropriate AE, and max assist X1 OT Short Term Goal 4 (Week 1): Patient will bathe 9/10 body parts, seated in w/c, using AE as needed with setup assist  Skilled Therapeutic Interventions/Progress Updates:   Session #1 [id number] - 55 Minutes Individual Therapy Patient with 10/10 complaints of pain, RN made aware Patient found supine in bed. Patient engaged in bed mobility for UB/LB bathing & dressing. Patient sat edge of bed for UB dressing and donning of TLSO. Patient then performed sit->supine for a break and then rolled -> other side of bed to sit edge of bed. Slide board transfer then performed from edge of bed -> w/c with 3 person assist prn; patient able to complete transfer with moderate assistance. Patient was anxious about transfer secondary to difficulty with transfer over the weekend. Once in w/c, patient sat at sink for grooming tasks. At end of session, left patient seated in w/c beside bed with call bell & phone within reach.   Session #2 [id number] - 40 Minutes Individual Therapy No complaints of pain except during movements.  Patient found seated in w/c. Therapist and patient problem solved best way to perform toileting and toilet transfer at this time. First attempted a stand pivot transfer in bathroom onto elevated toilet seat using platform rolling walker; patient unable to safely stand. Patient then attempted squat pivot transfer onto drop arm BSC, transfer was successful with moderate assistance and two people  assisting. Patient transferred back to w/c and therapist performed AAROM -> left shoulder and elbow, encouraged patient to perform AROM-> shoulder and elbow during day to prevent stiffness. At end of session, left patient seated in w/c beside bed with call bell & phone within reach.   Precautions:  Precautions Precautions: Back Required Braces or Orthoses: Knee Immobilizer - Right Knee Immobilizer - Right: On at all times Spinal Brace: Thoracolumbosacral orthotic Restrictions Weight Bearing Restrictions: Yes LUE Weight Bearing: Weight bearing as tolerated (thru elbow) RLE Weight Bearing: Non weight bearing  See FIM for current functional status  Floy Riegler 06/02/2012, 7:33 AM
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
## 2012-06-02 NOTE — Progress Notes (Signed)
Patient information reviewed and entered into eRehab system by Rodriquez Thorner, RN, CRRN, PPS Coordinator.  Information including medical coding and functional independence measure will be reviewed and updated through discharge.    

## 2012-06-02 NOTE — Progress Notes (Signed)
Physical Therapy Session Note  Patient Details  Name: Dean Bowers MRN: [id number] Date of Birth: [birth year]  Today's Date: 06/02/2012 Time: 4098-1191 Time Calculation (min): 55 min  Short Term Goals: Week 1:  PT Short Term Goal 1 (Week 1): Patient will be able to p-erform sit to stand transfer with modA. PT Short Term Goal 2 (Week 1): Patient will be able to perform supine to sit with SUP. PT Short Term Goal 3 (Week 1): Patient will bve able to tolerate sitting position in w/c ,for up to 3 hrs w/o signs of exertion. PT Short Term Goal 4 (Week 1): Patient will be able to perform transfer bed to w/c with sliding board with mod A.  Skilled Therapeutic Interventions/Progress Updates:   Session focused on w/c mobility on unit using LLE and RUE due to restrictions progressing to S for overall functional mobility and endurance training. Transfer training initially with education on slide board transfer from w/c to mat with min A. Progressed to practicing sit to stands from elevated mat and using L PFRW (WB through L elbow only) with mod A; therapist adjusting height of platform. Initial gait trial with L PFRW taking 2 small hops forward and then backwards, limited due to fatigue/pain. Transferred back to w/c with stand pivot using PFRW; cues for hand placement to reach back before sitting. Educated on ROM for RLE and phantom pain/sensation. Changed out R amputee pad for improved positioning and support. RN present to change dressing.   Therapy Documentation Precautions:  Precautions Precautions: Back Required Braces or Orthoses: Knee Immobilizer - Right Knee Immobilizer - Right: On at all times Spinal Brace: Thoracolumbosacral orthotic Restrictions Weight Bearing Restrictions: Yes LUE Weight Bearing: Weight bearing as tolerated (thru elbow) RLE Weight Bearing: Non weight bearing   Pain:  reports 8/10 pain in back and RLE- RN giving pain medication at beginning of session.   See FIM for  current functional status  Therapy/Group: Individual Therapy  Karolee Stamps [facility] 06/02/2012, 10:33 AM

## 2012-06-02 NOTE — Progress Notes (Signed)
Physical Therapy Session Note  Patient Details  Name: Dean Bowers MRN: [id number] Date of Birth: 1970/06/15  Today's Date: 06/02/2012 Time: 1530-1600 Time Calculation (min): 30 min  Short Term Goals: Week 1:  PT Short Term Goal 1 (Week 1): Patient will be able to p-erform sit to stand transfer with modA. PT Short Term Goal 2 (Week 1): Patient will be able to perform supine to sit with SUP. PT Short Term Goal 3 (Week 1): Patient will bve able to tolerate sitting position in w/c ,for up to 3 hrs w/o signs of exertion. PT Short Term Goal 4 (Week 1): Patient will be able to perform transfer bed to w/c with sliding board with mod A.  Skilled Therapeutic Interventions/Progress Updates:    Pt presents still up in w/c; focused session on transfer back to bed with education on set up/positioning of w/c, slide board transfer with min A, and RLE therex including knee ROM, SLR, and supine hip abduction x 10 reps x 2 sets each. Education provided on desensitization techniques, importance of ROM/strengthening program in preparation for prosthesis, and positioning in the bed (use of pillows). Also discussed amputee peer support visitation available, and pt verbalized interest during stay ("not quite yet"). Once pt ready, plan to contact peer visitor to meet with patient.   Therapy Documentation Precautions:  Precautions Precautions: Back Spinal Brace: Thoracolumbosacral orthotic Restrictions Weight Bearing Restrictions: Yes LUE Weight Bearing: Weight bearing as tolerated RLE Weight Bearing: Non weight bearing   Pain:  Reports being sore - repositioned as needed.  See FIM for current functional status  Therapy/Group: Individual Therapy  Karolee Stamps [facility] 06/02/2012, 4:09 PM

## 2012-06-03 ENCOUNTER — Inpatient Hospital Stay (HOSPITAL_COMMUNITY): Payer: 59 | Admitting: *Deleted

## 2012-06-03 ENCOUNTER — Inpatient Hospital Stay (HOSPITAL_COMMUNITY): Payer: 59

## 2012-06-03 ENCOUNTER — Inpatient Hospital Stay (HOSPITAL_COMMUNITY): Payer: 59 | Admitting: Physical Therapy

## 2012-06-03 DIAGNOSIS — S72409A Unspecified fracture of lower end of unspecified femur, initial encounter for closed fracture: Secondary | ICD-10-CM

## 2012-06-03 DIAGNOSIS — S98919A Complete traumatic amputation of unspecified foot, level unspecified, initial encounter: Secondary | ICD-10-CM

## 2012-06-03 MED ORDER — SENNOSIDES-DOCUSATE SODIUM 8.6-50 MG PO TABS
2.0000 | ORAL_TABLET | Freq: Every day | ORAL | Status: DC
  Administered 2012-06-03 – 2012-06-12 (×10): 2 via ORAL
  Filled 2012-06-03 (×10): qty 2

## 2012-06-03 MED ORDER — BISACODYL 10 MG RE SUPP
10.0000 mg | Freq: Every day | RECTAL | Status: DC | PRN
  Administered 2012-06-03: 10 mg via RECTAL
  Filled 2012-06-03: qty 1

## 2012-06-03 NOTE — Progress Notes (Signed)
Occupational Therapy Session Note  Patient Details  Name: Dean Bowers MRN: [id number] Date of Birth: Mar 06, 1970  Today's Date: 06/03/2012  Session 1 Time: 0830-1000 Time Calculation (min): 90 min  Short Term Goals: Week 1:  OT Short Term Goal 1 (Week 1): Patient will complete 4 of 4 grooming activities seated in w/c independently OT Short Term Goal 2 (Week 1): Patient will dress UB sitting at EOB with steadying assist OT Short Term Goal 3 (Week 1): Patient will complete stand-pivot transfer, bed <> w/c, with appropriate AE, and max assist X1 OT Short Term Goal 4 (Week 1): Patient will bathe 9/10 body parts, seated in w/c, using AE as needed with setup assist  Skilled Therapeutic Interventions/Progress Updates:    Pt seated in recliner upon arrival.  Pt performed stand pivot transfer to w/c to bathe and dress w/c level at sink.  Pt required min A for sit<>stand and steady A when standing.  Pt required assistance for bathing buttocks and left foot. AE (reacher) introduced to assist with LB dressing.  Pt required min verbal cues for safety - pt moves quickly and stood X 2 prior to assistance present. Pt demonstrated ability to doff and donn TLSO.  Focus on dynamic standing balance, transfers, activity tolerance, and safety awareness.  Last 30 mins of therapy session with Recreation Therapist in gym with focus on w/c management and discharge plans. Therapy Documentation Precautions:  Precautions Precautions: Back Required Braces or Orthoses: Spinal Brace Knee Immobilizer - Right: On at all times Spinal Brace: Thoracolumbosacral orthotic;Applied in sitting position Restrictions Weight Bearing Restrictions: Yes LUE Weight Bearing: Weight bearing as tolerated RLE Weight Bearing: Non weight bearing   Pain: Pain Assessment Pain Assessment: 0-10 Pain Score:   7 Pain Type: Surgical pain Pain Location: Back Pain Orientation: Mid Pain Descriptors: Aching Pain Onset: On-going Patients  Stated Pain Goal: 2 Pain Intervention(s): RN made aware;Other (Comment) (premedicated to therapy per pt)  See FIM for current functional status  Therapy/Group: Individual Therapy  Session 2 Time: 1300-1400 Pt denies pain Individual Therapy  Pt engaged in practiced tub bench transfer and amb with PFRW into bathroom from bedroom in ADL apartment.  Pt engaged in dynamics standing activities (Wii bowling) requiring steady A while standing.  Pt returned to room for transfer to [facility] for nursing care.  Focus on functional amb with PFRW, dynamic standing balance, transfers, activity tolerance, and safety awareness.  [name] [facility] 06/03/2012, 11:27 AM
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
## 2012-06-03 NOTE — Progress Notes (Signed)
[facility] Individual Statement of Services  Patient Name:  Dean Bowers  Date:  06/03/2012  Welcome to the [facility].  Our goal is to provide you with an individualized program based on your diagnosis and situation, designed to meet your specific needs.  With this comprehensive rehabilitation program, you will be expected to participate in at least 3 hours of rehabilitation therapies Monday-Friday, with modified therapy programming on the weekends.  Your rehabilitation program will include the following services:  Physical Therapy (PT), Occupational Therapy (OT), Speech Therapy (ST), 24 hour per day rehabilitation nursing, Therapeutic Recreaction (TR), Neuropsychology, Case Management (RN and Social Worker), Rehabilitation Medicine, Nutrition Services and Pharmacy Services  Weekly team conferences will be held on Tuesdays to discuss your progress.  Your Social Worker will talk with you frequently to get your input and to update you on team discussions.  Team conferences with you and your family in attendance may also be held.  Expected length of stay:  2-3 weeks    Overall anticipated outcome: supervision - minimal assist  Depending on your progress and recovery, your program may change. Your Social Worker will coordinate services and will keep you informed of any changes. Your Social Worker's name and contact numbers are listed  below.  The following services may also be recommended but are not provided by the [facility]:   Driving Evaluations  Home Health Rehabiltiation Services  Outpatient Rehabilitatation [facility]  Vocational Rehabilitation   Arrangements will be made to provide these services after discharge if needed.  Arrangements include referral to agencies that provide these services.  Your insurance has been verified to be:  [employer] and [employer] Your primary doctor is:  Dr. Susann Givens  Pertinent information will be  shared with your doctor and your insurance company.  Social Worker:  [place], [place] [phone] or ([phone]  Information discussed with and copy given to patient by: Amada Jupiter, 06/03/2012, 1:14 PM

## 2012-06-03 NOTE — Progress Notes (Signed)
Physical Therapy Session Note  Patient Details  Name: Dean Bowers MRN: [id number] Date of Birth: 10-24-1970  Today's Date: 06/03/2012 Time: 1100-1154 Time Calculation (min): 54 min  Short Term Goals: Week 1:  PT Short Term Goal 1 (Week 1): Patient will be able to p-erform sit to stand transfer with modA. PT Short Term Goal 2 (Week 1): Patient will be able to perform supine to sit with SUP. PT Short Term Goal 3 (Week 1): Patient will bve able to tolerate sitting position in w/c ,for up to 3 hrs w/o signs of exertion. PT Short Term Goal 4 (Week 1): Patient will be able to perform transfer bed to w/c with sliding board with mod A.  Skilled Therapeutic Interventions/Progress Updates:    This session focused on WC mobility with right arm and left leg.  Min assist at times for tight spaces >150'.  Slide board transfer min assist to stabilize chair and help set up WC.  Verbal cues for sequencing and safety.  Mat mobility sit to side lying left supervision, side lying to sit supervision.  Stand from mildly elevated mat table min assist (up to mod assist with repetition and fatigue).  Stand pivot transfer with L PFRW min assist with hop/pivotal steps.  Standing at parallel bar: R hip abduction, ext, knee flexion x 10 each.  Seated LAQs and knee flexion.  Reinforced desensitization massage (asking wife to get area he cannot reach) and quad sets while resting in the chair.    Therapy Documentation Precautions:  Precautions Precautions: Back Required Braces or Orthoses: Spinal Brace Knee Immobilizer - Right: On at all times Spinal Brace: Thoracolumbosacral orthotic;Applied in sitting position Restrictions Weight Bearing Restrictions: Yes LUE Weight Bearing: Weight bearing as tolerated (through elbow) RLE Weight Bearing: Non weight bearing   Pain: Pain Assessment Pain Assessment: 0-10 Pain Score:   7 Pain Type: Surgical pain Pain Location: Back Pain Orientation: Mid Pain Descriptors:  Aching Pain Onset: On-going Patients Stated Pain Goal: 2 Pain Intervention(s): RN made aware;Other (Comment) (premedicated to therapy per pt)   Locomotion : Wheelchair Mobility Distance: 150   See FIM for current functional status  Therapy/Group: Individual Therapy  Lurena Joiner B. Nolyn Swab, PT, DPT [phone]   06/03/2012, 11:58 AM
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
## 2012-06-04 ENCOUNTER — Inpatient Hospital Stay (HOSPITAL_COMMUNITY): Payer: 59

## 2012-06-04 ENCOUNTER — Inpatient Hospital Stay (HOSPITAL_COMMUNITY): Payer: 59 | Admitting: *Deleted

## 2012-06-04 DIAGNOSIS — S98919A Complete traumatic amputation of unspecified foot, level unspecified, initial encounter: Secondary | ICD-10-CM

## 2012-06-04 DIAGNOSIS — S72409A Unspecified fracture of lower end of unspecified femur, initial encounter for closed fracture: Secondary | ICD-10-CM

## 2012-06-04 DIAGNOSIS — L0291 Cutaneous abscess, unspecified: Secondary | ICD-10-CM

## 2012-06-04 DIAGNOSIS — L0390 Cellulitis, unspecified: Secondary | ICD-10-CM

## 2012-06-04 NOTE — Progress Notes (Signed)
Physical Therapy Session Note  Patient Details  Name: Dean Bowers MRN: [id number] Date of Birth: 1971/01/31  Today's Date: 06/04/2012 Time: 1300-1330 Time Calculation (min): 30 min  Short Term Goals: Week 1:  PT Short Term Goal 1 (Week 1): Patient will be able to p-erform sit to stand transfer with modA. PT Short Term Goal 2 (Week 1): Patient will be able to perform supine to sit with SUP. PT Short Term Goal 3 (Week 1): Patient will bve able to tolerate sitting position in w/c ,for up to 3 hrs w/o signs of exertion. PT Short Term Goal 4 (Week 1): Patient will be able to perform transfer bed to w/c with sliding board with mod A.  Skilled Therapeutic Interventions/Progress Updates:    Focused on transfers with L PFRW (minA), dynamic standing balance and limits of stability with LPFRW while reaching for horseshoes and placing overhead or to the R, and education on positioning of residual limb in the bed to keep elevated/but in extension. Pt requires cues for safety with PFRW; quick to move or pull up on RW instead of pushing up from w/c. W/c propulsion to/from therapy for endurance/strengthening.   Therapy Documentation Precautions:  Precautions Precautions: Back Required Braces or Orthoses: Spinal Brace Knee Immobilizer - Right: On at all times Spinal Brace: Thoracolumbosacral orthotic;Applied in sitting position Restrictions Weight Bearing Restrictions: Yes LUE Weight Bearing: Weight bearing as tolerated (to elbow) RLE Weight Bearing: Non weight bearing   Pain: Pain Assessment Pain Score:   8 Pain Type: Surgical pain;Acute pain Pain Location: Back Pain Descriptors: Aching Pain Intervention(s): Medication (See eMAR)   See FIM for current functional status  Therapy/Group: Individual Therapy  Karolee Stamps [facility] 06/04/2012, 1:42 PM

## 2012-06-04 NOTE — Progress Notes (Addendum)
Physical Therapy Session Note  Patient Details  Name: Dean Bowers MRN: [id number] Date of Birth: Oct 03, 1970  Today's Date: 06/04/2012 Time: 4098-1191 Time Calculation (min): 55 min  Short Term Goals: Week 1:  PT Short Term Goal 1 (Week 1): Patient will be able to p-erform sit to stand transfer with modA. PT Short Term Goal 2 (Week 1): Patient will be able to perform supine to sit with SUP. PT Short Term Goal 3 (Week 1): Patient will bve able to tolerate sitting position in w/c ,for up to 3 hrs w/o signs of exertion. PT Short Term Goal 4 (Week 1): Patient will be able to perform transfer bed to w/c with sliding board with mod A.  Skilled Therapeutic Interventions/Progress Updates:    Stand pivot transfer wheelchair > mat with min assist, cues for slowing movement for safety and balance. Rt. LE BKA exercises initiated with handout provided, 1 x 10 reps glute squeeze, long arc quad, sidelying hip extension. Stressed importance of Rt. Knee positioning to decrease risk of knee flexion contracture. Also discussed importance of Rt. LE strength while prosthetic training.   Ambulation (hopping) x 17', 23', 26' with Lt. Platform RW and min assist, +2 persons for safety given pt's body habitus and deconditioned state ambulating on one LE. Pt able to verbalize when seated rest needed however followed closely with chair. Standing balance activity with only Lt. UE supported through elbow, Rt. UE movements with light weighted medicine ball, min-guard assist.   Practiced desensitization with towel secondary to pt unable to reach distal residual limb.   Therapy Documentation Precautions:  Precautions Precautions: Back Required Braces or Orthoses: Spinal Brace Knee Immobilizer - Right: On at all times Spinal Brace: Thoracolumbosacral orthotic;Applied in sitting position Restrictions Weight Bearing Restrictions: Yes LUE Weight Bearing: Weight bearing as tolerated (to elbow) RLE Weight Bearing: Non  weight bearing  Pain: Pain Assessment Pain Score:   7 Pain Type: Surgical pain;Acute pain Pain Location: leg Pain Orientation: Right Pain Descriptors: Aching Pain Onset: On-going Pain Intervention(s): Medication (See eMAR) See FIM for current functional status  Therapy/Group: Co-Treatment with recreational therapist   Wilhemina Bonito 06/04/2012, 12:04 PM
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
## 2012-06-04 NOTE — Progress Notes (Addendum)
Occupational Therapy Session Note  Patient Details  Name: Dean Bowers MRN: [id number] Date of Birth: 11/01/1970  Today's Date: 06/04/2012  Session 1 Time: 0700-0756 Time Calculation (min): 56 min  Short Term Goals: Week 1:  OT Short Term Goal 1 (Week 1): Patient will complete 4 of 4 grooming activities seated in w/c independently OT Short Term Goal 2 (Week 1): Patient will dress UB sitting at EOB with steadying assist OT Short Term Goal 3 (Week 1): Patient will complete stand-pivot transfer, bed <> w/c, with appropriate AE, and max assist X1 OT Short Term Goal 4 (Week 1): Patient will bathe 9/10 body parts, seated in w/c, using AE as needed with setup assist  Skilled Therapeutic Interventions/Progress Updates:    Pt seated in recliner upon arrival.  Pt amb with PFRW from recliner to w/c (approx 10') to complete bathing and dressing with sit<>stand at sink.  Focus on safety awareness, sit<>stand, funcitonal amb with RW, donning/doffing TLSO.  Pt continues to demonstrate slight impulsivity transitioning from one activity to another.  Pt requires assistance to bathe buttocks.    Therapy Documentation Precautions:  Precautions Precautions: Back Required Braces or Orthoses: Spinal Brace Knee Immobilizer - Right: On at all times Spinal Brace: Thoracolumbosacral orthotic;Applied in sitting position Restrictions Weight Bearing Restrictions: Yes LUE Weight Bearing: Weight bearing as tolerated (through elbow) RLE Weight Bearing: Non weight bearing Pain: Pain Assessment Pain Assessment: 0-10 Pain Score:   7 Pain Type: Surgical pain Pain Location: Back Pain Orientation: Mid Pain Descriptors: Aching Patients Stated Pain Goal: 2 Pain Intervention(s): RN made aware;Repositioned  See FIM for current functional status  Therapy/Group: Individual Therapy  Session 2 Time: 1345-1430 Pt c/o 7/10 pain in mid back; RN admin meds during session Individual Therapy  Pt sitting EOB upon  arrival donning TLSO in preparation for therapy session.  Pt completed stand pivot transfer with PFRW and rolled to gym.  Pt's mother present to observe therapy.  Pt engaged in dynamic standing activities with PFRW (Wii tennis).  Pt required rest breaks X 4 during session.  Pt required steady A while standing.  Pt continues to exhibit minimal impulsivity and initiates transitional movements before therapist is ready to assist/supervise.  [name] [facility] 06/04/2012, 7:58 AM
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
## 2012-06-04 NOTE — Progress Notes (Signed)
Recreational Therapy Session Note  Patient Details  Name: Dean Bowers MRN: [id number] Date of Birth: January 01, 1971 Today's Date: 06/04/2012 Time:  830-935 Pain: no c/o Skilled Therapeutic Interventions/Progress Updates: Session focused on activity tolerance, standing tolerance,ambulation, dynamic standing balance with only support of L elbow.  Pt ambulated/hopped 17', 23', 26' with Lt. Platform RW and min assist, +2 persons for safety.  Pt able to verbalized when seated rest needed, however followed closely with chair.  Pt did require occasional min cues for safety with PFRW, not to get it too far ahead during mobility.  Pt performed standing balance activity using weighted ball in RUE using L elbow for support with min assist.  Pt performed w/c mobility on unit with supervision.  Therapy/Group: Co-Treatment  Derral Colucci 06/04/2012, 4:16 PM

## 2012-06-04 NOTE — Progress Notes (Signed)
Social Work Patient ID: Dean Bowers, male   DOB: 1970/07/21, 42 y.o.   MRN: [id number]  Met with pt and wife this morning to review team conference - both aware and agreeable with targeted d/c 5/13 and report family and friends will assist with any assistance needed and take care of home modifications including a ramp.  Pt states, "I'd rather be home but I know I have a lot to do here... A lot to work on ..."  Continue to follow.  Nealy Hickmon, LCSW

## 2012-06-05 ENCOUNTER — Inpatient Hospital Stay (HOSPITAL_COMMUNITY): Payer: 59 | Admitting: Physical Therapy

## 2012-06-05 ENCOUNTER — Inpatient Hospital Stay (HOSPITAL_COMMUNITY): Payer: 59

## 2012-06-05 MED ORDER — BETHANECHOL CHLORIDE 25 MG PO TABS
25.0000 mg | ORAL_TABLET | Freq: Three times a day (TID) | ORAL | Status: DC
  Administered 2012-06-05 – 2012-06-09 (×15): 25 mg via ORAL
  Filled 2012-06-05 (×19): qty 1

## 2012-06-05 MED ORDER — ENSURE COMPLETE PO LIQD
120.0000 mL | Freq: Four times a day (QID) | ORAL | Status: DC
  Administered 2012-06-05 – 2012-06-11 (×16): 120 mL via ORAL
  Administered 2012-06-11: 360 mL via ORAL
  Administered 2012-06-11 – 2012-06-12 (×5): 120 mL via ORAL

## 2012-06-05 MED ORDER — CEPHALEXIN 500 MG PO CAPS
500.0000 mg | ORAL_CAPSULE | Freq: Three times a day (TID) | ORAL | Status: DC
  Administered 2012-06-05 – 2012-06-06 (×4): 500 mg via ORAL
  Filled 2012-06-05 (×8): qty 1

## 2012-06-05 NOTE — Progress Notes (Addendum)
Physical Therapy Session Note  Patient Details  Name: Dean Bowers MRN: [id number] Date of Birth: 12/16/1970  Today's Date: 06/05/2012 Time: Session #1:  4098-1191, Session #2: [id number] Time Calculation (min): Session #1: 58 min, Session #2: 41 min   Short Term Goals: Week 1:  PT Short Term Goal 1 (Week 1): Patient will be able to p-erform sit to stand transfer with modA. PT Short Term Goal 2 (Week 1): Patient will be able to perform supine to sit with SUP. PT Short Term Goal 3 (Week 1): Patient will bve able to tolerate sitting position in w/c ,for up to 3 hrs w/o signs of exertion. PT Short Term Goal 4 (Week 1): Patient will be able to perform transfer bed to w/c with sliding board with mod A.  Skilled Therapeutic Interventions/Progress Updates:    Session #1: (co-session with rec therapist)This session focused on WC mobility long distances for endurance and strength training both indoor and outdoor surfaces, up a 3% graded ramp.  Pt was able to wheel both forwards and backwards with supervision and verbal cues for obstacles when he was going backwards.  Verbal cues at times to slow down for safety.   Stand pivot transfer from [facility] to Nu step min assist and back to [facility] after NuStep min assist.  Nustep level 5 x 10 mins with ~3 seated rest breaks to hydrate and wipe sweat <2 mins at a time.  Pt very fatigued at end of session.  PT and rec therapist continued to encourage pt to take rest breaks when he needs to for conservation.  Session #2: This session focused on WC mobility> 150' supervision forwards and backwards (when he got tired he would turn around and push backwards) and WC parts management (trying to figure out a way he could manage his own leg rest)-asked Tom, OTA to help work on this tomorrow.  Bed mobility and transfers and gait in ADL apartment.  Bed mobility supervision for safety due to fast speed of movement.  Gait over carpet min assist with L PFRW and cues to increase left foot  clearance when hopping on carpet.  Gait and functional activity in kitchen working on opening and closing refrigerator, using stool to put dishes from sink into dishwasher.  Pt needed cues for safety as he was quick to move and quick to get off balance, especially when managing the refrigerator door. Car transfer min guard assist with cues for safe technique and strategies to help make it easier.  Wife present entire session, education performed with pt/wife.       Therapy Documentation Precautions:  Precautions Precautions: Back Required Braces or Orthoses: Spinal Brace Knee Immobilizer - Right: On at all times Spinal Brace: Thoracolumbosacral orthotic;Applied in sitting position Restrictions Weight Bearing Restrictions: Yes LUE Weight Bearing: Weight bearing as tolerated (to elbow) RLE Weight Bearing: Non weight bearing Pain: Pain Assessment Pain Assessment: 0-10 Pain Score:   9 Pain Type: Acute pain Pain Location: Hip Pain Orientation: Lower Pain Descriptors: Aching Pain Onset: On-going Patients Stated Pain Goal: 2 Pain Intervention(s): Medication (See eMAR)    Locomotion : Wheelchair Mobility Distance: 700   See FIM for current functional status  Therapy/Group: Co-Treatment with rec therapy  Nori Winegar B. Rosalin Buster, PT, DPT [phone]   06/05/2012, 11:38 AM

## 2012-06-05 NOTE — Progress Notes (Signed)
All staples removed from right upper leg without complication. Steri-strips applied at 3 points.

## 2012-06-05 NOTE — Progress Notes (Signed)
Recreational Therapy Session Note  Patient Details  Name: JIOVANNI HEETER MRN: [id number] Date of Birth: 1970-07-19 Today's Date: 06/05/2012 Time:  9-10 Pain: no c/o Skilled Therapeutic Interventions/Progress Updates:This session focused on WC mobility long distances for endurance and strength training both indoor and outdoor surfaces. Pt was able to wheel both forwards and backwards with supervision and verbal cues for obstacles when he was going backwards. Verbal cues at times to slow down for safety. Stand pivot transfer from [facility] to Nu step min assist and back to [facility] after NuStep min assist. Nustep level 5 x 10 mins with ~3 seated rest breaks to hydrate and wipe sweat <2 mins at a time. Pt very fatigued at end of session. Encouraged pt to take rest breaks when he needs to for energy conservation.   Therapy/Group: Co-Treatment Ranell Skibinski 06/05/2012, 8:26 AM

## 2012-06-05 NOTE — Progress Notes (Signed)
Physical Therapy Note  Patient Details  Name: KELDAN EPLIN MRN: [id number] Date of Birth: [birth year] Today's Date: 06/05/2012  1:00 - 1:30 30 minutes Individual session Patient reports pain at 6 .   Patient sitting in wheelchair upon entering room. Patient used urinal with wife's assistance. Patient propelled wheelchair using left LE 150 feet to gym. Patient transferred wheelchair to mat with by stand pivot using platform rolling walker and min steady assist. Patient performed bed mobility with supervision. Patient performed side lying exercises on right including hip flexion/extension, knee flexion/extension, and hip abduction for 2 sets of 8 to 10 reps each. Patient sit to stand from mat with min assist and hopped with left platform rolling walker 20 feet. Patient returned to room and left in wheelchair with all items in reach.  Arelia Longest M 06/05/2012, 2:10 PM
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
## 2012-06-05 NOTE — Progress Notes (Signed)
Occupational Therapy Session Note  Patient Details  Name: Dean Bowers MRN: [id number] Date of Birth: 1970/04/16  Today's Date: 06/05/2012 Time: 0700-0758 Time Calculation (min): 58 min  Short Term Goals: Week 1:  OT Short Term Goal 1 (Week 1): Patient will complete 4 of 4 grooming activities seated in w/c independently OT Short Term Goal 2 (Week 1): Patient will dress UB sitting at EOB with steadying assist OT Short Term Goal 3 (Week 1): Patient will complete stand-pivot transfer, bed <> w/c, with appropriate AE, and max assist X1 OT Short Term Goal 4 (Week 1): Patient will bathe 9/10 body parts, seated in w/c, using AE as needed with setup assist  Skilled Therapeutic Interventions/Progress Updates:    Pt seated in w/c upon arrival.  Pt engaged in bathing and dressing with sit<>stand from w/c.  Pt required verbal cues for safety awareness (lock w/c, don't stand without assistance, slow down).  Pt verbalized understanding but continues to require occasional verbal cues.  Pt uses reacher to assist with LB dressing tasks.  Pt stood at sink to doff pants and pull up pants with steady assist.  Focus on activity tolerance, dynamic standing balance, and safety awareness.  Therapy Documentation Precautions:  Precautions Precautions: Back Required Braces or Orthoses: Spinal Brace Knee Immobilizer - Right: On at all times Spinal Brace: Thoracolumbosacral orthotic;Applied in sitting position Restrictions Weight Bearing Restrictions: Yes LUE Weight Bearing: Weight bearing as tolerated (to elbow) RLE Weight Bearing: Non weight bearing Pain: Pain Assessment Pain Assessment: 0-10 Pain Score:   7 Pain Type: Acute pain;Phantom pain Pain Location: Leg Pain Orientation: Right Pain Descriptors: Aching Pain Onset: On-going Patients Stated Pain Goal: 2 Pain Intervention(s): RN made aware;Repositioned  See FIM for current functional status  Therapy/Group: Individual Therapy  Rich Brave 06/05/2012, 8:03 AM

## 2012-06-05 NOTE — Progress Notes (Signed)
Orthopedic Tech Progress Note Patient Details:  Dean Bowers 08/07/1970 [id number] Volar splint removed. Gauze and bandages replaced. Extra padding added before new splint applied. Volar splint applied and secured with an ace bandage. Application tolerated well. Patient stated splint felt ok and was neither too hot nor to tight upon application.  Ortho Devices Type of Ortho Device: Volar splint Ortho Device/Splint Location: Left Ortho Device/Splint Interventions: Application   Asia R Thompson 06/05/2012, 3:31 PM

## 2012-06-06 ENCOUNTER — Inpatient Hospital Stay (HOSPITAL_COMMUNITY): Payer: 59 | Admitting: *Deleted

## 2012-06-06 ENCOUNTER — Inpatient Hospital Stay (HOSPITAL_COMMUNITY): Payer: 59 | Admitting: Physical Therapy

## 2012-06-06 ENCOUNTER — Inpatient Hospital Stay (HOSPITAL_COMMUNITY): Payer: 59

## 2012-06-06 LAB — CBC
MCH: 27.8 pg (ref 26.0–34.0)
MCHC: 32.5 g/dL (ref 30.0–36.0)
Platelets: 425 10*3/uL — ABNORMAL HIGH (ref 150–400)
RDW: 14.3 % (ref 11.5–15.5)

## 2012-06-06 LAB — CREATININE, SERUM
Creatinine, Ser: 0.5 mg/dL (ref 0.50–1.35)
GFR calc Af Amer: >90 mL/min (ref >90)
GFR calc non Af Amer: >90 mL/min (ref >90)

## 2012-06-06 MED ORDER — GABAPENTIN 300 MG PO CAPS
300.0000 mg | ORAL_CAPSULE | Freq: Three times a day (TID) | ORAL | Status: DC
  Administered 2012-06-06 – 2012-06-13 (×25): 300 mg via ORAL
  Filled 2012-06-06 (×32): qty 1

## 2012-06-06 MED ORDER — ALPRAZOLAM 0.5 MG PO TABS
0.5000 mg | ORAL_TABLET | Freq: Three times a day (TID) | ORAL | Status: DC | PRN
  Administered 2012-06-06 – 2012-06-12 (×8): 0.5 mg via ORAL
  Filled 2012-06-06 (×8): qty 1

## 2012-06-06 MED ORDER — CEFAZOLIN SODIUM 1-5 GM-% IV SOLN
1.0000 g | Freq: Three times a day (TID) | INTRAVENOUS | Status: DC
  Administered 2012-06-06 – 2012-06-11 (×15): 1 g via INTRAVENOUS
  Filled 2012-06-06 (×17): qty 50
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
## 2012-06-06 NOTE — Plan of Care (Signed)
Problem: RH PAIN MANAGEMENT Goal: RH STG PAIN MANAGED AT OR BELOW PT'S PAIN GOAL 4 or less  Outcome: Not Progressing Pt still report pain rating of 6 or >

## 2012-06-06 NOTE — Progress Notes (Signed)
Occupational Therapy Session Note  Patient Details  Name: Dean Bowers MRN: [id number] Date of Birth: 01-Jun-1970  Today's Date: 06/06/2012 Time: 0700-0800 Time Calculation (min): 60 min  Short Term Goals: Week 1:  OT Short Term Goal 1 (Week 1): Patient will complete 4 of 4 grooming activities seated in w/c independently OT Short Term Goal 2 (Week 1): Patient will dress UB sitting at EOB with steadying assist OT Short Term Goal 3 (Week 1): Patient will complete stand-pivot transfer, bed <> w/c, with appropriate AE, and max assist X1 OT Short Term Goal 4 (Week 1): Patient will bathe 9/10 body parts, seated in w/c, using AE as needed with setup assist  Skilled Therapeutic Interventions/Progress Updates:    Pt resting in bed upon arrival.  After sitting EOB pt donned TLSO before amb with PFRW to w/c at sink for bathing and dressing.  Pt used reacher to assist with LB dressing but continues to require assistance with bathing buttocks.  Focus on dynamic standing balance, functional ambulation with PFRW, activity tolerance, and safety awareness.  Therapy Documentation Precautions:  Precautions Precautions: Back Required Braces or Orthoses: Spinal Brace Knee Immobilizer - Right: On at all times Spinal Brace: Thoracolumbosacral orthotic;Applied in sitting position Restrictions Weight Bearing Restrictions: Yes LUE Weight Bearing: Partial weight bearing (L UE WBAT to elbow) RLE Weight Bearing: Non weight bearing Pain: Pain Assessment Pain Assessment: 0-10 Pain Score:   6 Pain Type: Acute pain Pain Location: Leg Pain Orientation: Right Pain Descriptors: Aching Pain Onset: On-going Patients Stated Pain Goal: 2 Pain Intervention(s): RN made aware;Repositioned  See FIM for current functional status  Therapy/Group: Individual Therapy  Rich Brave 06/06/2012, 8:05 AM

## 2012-06-06 NOTE — Progress Notes (Signed)
Called to room after patient hit his stump during a fall working with staff. Open area of incision with 2 sutures broken loose. The area was cleansed with Betadine as well as Steri-Strips. Surgical gluing bedside as needed. Ace wrap applied as well as ice packs. Montez Morita physician assistant Dr. handy notified. Patient presently maintained on antibiotic therapy. We'll continue to monitor Will closely

## 2012-06-06 NOTE — Progress Notes (Signed)
Physical Therapy Note  Patient Details  Name: CORT DRAGOO MRN: [id number] Date of Birth: [birth year] Today's Date: 06/06/2012  [phone] (55 minutes) individual Pain: 7/10 back/ premedicated Focus of treatment: Therapeutic exercise/ gait focused on activity tolerance Other: pulse (resting) 115 BPM Precautions: NWB RT LE; weight bearing LT UE elbow only Treatment: transfers SBA LT PFRW ; Nustep Level 5 (3 X 5 minutes) with rest break between sessions; pulse post 5 minutes = 127 BPM ; gait 12 feet LT PFRW min to close SBA for safety.   [id number] (40 minutes) individual Pain: 7/10 back /premedicated Other: resting pulse 109 BPM Treatment: Transfers with left PFRW SBA ; sit ><side >< supine (mat) SBA; bilateral LE strengthening X 20 - RT LE (sidelying) hip flexion/extension/ abductions; supine SAQs; LT LE- supine hip flexion,abduction, SAQs, ankle pumps.   Ritamarie Arkin,JIM 06/06/2012, 11:56 AM

## 2012-06-06 NOTE — Progress Notes (Signed)
Physical Therapy Note  Patient Details  Name: Dean Bowers MRN: [id number] Date of Birth: August 09, 1970 Today's Date: 06/06/2012  Patient missed 30 minutes of skilled physical therapy this PM secondary to PA's orders for patient on bedrest with R residual limb elevation s/p fall. Will follow up as able.  Dean Bowers Dean Bowers, PT, DPT 06/06/2012, 4:07 PM

## 2012-06-06 NOTE — Progress Notes (Signed)
Patient was transferring from bed side commode to wheelchair with a rolling walker. Patient went to turn to sit down, while attempting to sit down the walker slipped out from under the patient. Floor was thought to be wet but appeared to be dry. Patient's nurse was notified and the PA was notified. Patient was upset but not upset with any staff. Patient was anxious and stated he had pain. Vitals 131/54, T:99.2, Resp: 28, P: 75 O2:92. Patient was seen by PA sutures were cleaned and redressed by the nurse

## 2012-06-06 NOTE — Progress Notes (Signed)
With Montez Morita physician assistant was Dr. handy concerning dressing changes to Mr. Stuckey leg and advised to remove Steri-Strips at this time and do wet to dry dressings daily as needed

## 2012-06-07 DIAGNOSIS — L0291 Cutaneous abscess, unspecified: Secondary | ICD-10-CM

## 2012-06-07 DIAGNOSIS — S72409A Unspecified fracture of lower end of unspecified femur, initial encounter for closed fracture: Secondary | ICD-10-CM

## 2012-06-07 DIAGNOSIS — S98919A Complete traumatic amputation of unspecified foot, level unspecified, initial encounter: Secondary | ICD-10-CM

## 2012-06-07 DIAGNOSIS — L0390 Cellulitis, unspecified: Secondary | ICD-10-CM

## 2012-06-07 LAB — CBC
HCT: 23.7 % — ABNORMAL LOW (ref 39.0–52.0)
MCHC: 31.6 g/dL (ref 30.0–36.0)
Platelets: 430 10*3/uL — ABNORMAL HIGH (ref 150–400)
RDW: 14.9 % (ref 11.5–15.5)
WBC: 14.9 10*3/uL — ABNORMAL HIGH (ref 4.0–10.5)
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
## 2012-06-07 NOTE — Plan of Care (Signed)
Problem: RH PAIN MANAGEMENT Goal: RH STG PAIN MANAGED AT OR BELOW PT'S PAIN GOAL 4 or less  Outcome: Not Progressing Patient reports pain no lower than a 7 out of 10

## 2012-06-08 ENCOUNTER — Inpatient Hospital Stay (HOSPITAL_COMMUNITY): Payer: 59 | Admitting: Occupational Therapy

## 2012-06-08 NOTE — Progress Notes (Signed)
At 2159 20mg  oxy ir given for complaint of right stump pain, along with phantom pain to right foot. At 2309 requested and was given PRN xanax. At 0125 spot checked O2 sat=85% on RA, O2 added at 2L/m, up to 92%. Patient without complain of at that time. Right stump dressing C,D,&I. Alfredo Martinez A

## 2012-06-08 NOTE — Progress Notes (Signed)
Occupational Therapy Session Note  Patient Details  Name: Dean Bowers MRN: [id number] Date of Birth: 1970-04-24  Today's Date: 06/08/2012 Time: 0830-0915 Time Calculation (min): 45 min  Skilled Therapeutic Interventions/Progress Updates:      Therapy Documentation Precautions:  Precautions Precautions: Back Required Braces or Orthoses: Spinal Brace Knee Immobilizer - Right: On at all times Spinal Brace: Thoracolumbosacral orthotic;Applied in sitting position Restrictions Weight Bearing Restrictions: Yes LUE Weight Bearing: Weight bear through elbow only RLE Weight Bearing: Non weight bearing  Pain: Pain Assessment Pain Assessment: 0-10 Pain Score:   6 Pain Type: Acute pain Pain Location: Back Pain Orientation: Lower Pain Descriptors: Aching Pain Frequency: Intermittent Pain Intervention(s): Medication (See eMAR) Multiple Pain Sites: Yes 2nd Pain Site Pain Score: 6 Pain Type: Acute pain Pain Location: Arm Pain Orientation: Left Pain Descriptors: Aching Pain Frequency: Intermittent Pain Intervention(s): Medication (See eMAR)  ADL: In w/c at sink with focus on slowing down for safety and locking breaks during activity in order to stabilize chair as well as standing dynamically to wash/dress periarea/buttocks.  Patient was motivated and worked hard during session.  He moved at a very fast pace for all activities, and he stated, "I have always done things fast."  See FIM for current functional status  Therapy/Group: Individual Therapy  Bud Face [facility] 06/08/2012, 4:10 PM

## 2012-06-08 NOTE — Plan of Care (Signed)
Problem: RH PAIN MANAGEMENT Goal: RH STG PAIN MANAGED AT OR BELOW PT'S PAIN GOAL 4 or less  Outcome: Progressing Pain managed to 5/6 out of 10.
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
## 2012-06-09 ENCOUNTER — Inpatient Hospital Stay (HOSPITAL_COMMUNITY): Payer: 59

## 2012-06-09 ENCOUNTER — Inpatient Hospital Stay (HOSPITAL_COMMUNITY): Payer: 59 | Admitting: Physical Therapy

## 2012-06-09 DIAGNOSIS — S98919A Complete traumatic amputation of unspecified foot, level unspecified, initial encounter: Secondary | ICD-10-CM

## 2012-06-09 DIAGNOSIS — L0390 Cellulitis, unspecified: Secondary | ICD-10-CM

## 2012-06-09 DIAGNOSIS — L0291 Cutaneous abscess, unspecified: Secondary | ICD-10-CM

## 2012-06-09 DIAGNOSIS — S72409A Unspecified fracture of lower end of unspecified femur, initial encounter for closed fracture: Secondary | ICD-10-CM

## 2012-06-09 LAB — CBC WITH DIFFERENTIAL/PLATELET
Eosinophils Absolute: 0.2 10*3/uL (ref 0.0–0.7)
Lymphs Abs: 2.1 10*3/uL (ref 0.7–4.0)
MCH: 27.5 pg (ref 26.0–34.0)
Neutro Abs: 9.9 10*3/uL — ABNORMAL HIGH (ref 1.7–7.7)
Neutrophils Relative %: 74 % (ref 43–77)
Platelets: 424 10*3/uL — ABNORMAL HIGH (ref 150–400)
RBC: 2.84 MIL/uL — ABNORMAL LOW (ref 4.22–5.81)
WBC: 13.3 10*3/uL — ABNORMAL HIGH (ref 4.0–10.5)

## 2012-06-09 NOTE — Progress Notes (Addendum)
Physical Therapy Session Note  Patient Details  Name: Dean Bowers MRN: [id number] Date of Birth: May 16, 1970  Today's Date: 06/09/2012 Time: 2130-8657 Time Calculation (min): 63 min  Short Term Goals: Week 1:  PT Short Term Goal 1 (Week 1): Patient will be able to p-erform sit to stand transfer with modA. PT Short Term Goal 2 (Week 1): Patient will be able to perform supine to sit with SUP. PT Short Term Goal 3 (Week 1): Patient will bve able to tolerate sitting position in w/c ,for up to 3 hrs w/o signs of exertion. PT Short Term Goal 4 (Week 1): Patient will be able to perform transfer bed to w/c with sliding board with mod A.  Skilled Therapeutic Interventions/Progress Updates:     Controlled environment and outdoor mobility with wheelchair forwards and backwards (prefered by pt) propulsion x >500' with supervision particularly with backwards propulsion. Pt able to negotiate wheelchair onto/out of elevator with appropriate speed, practiced inclines/declines and uneven terrain all with supervision. Outdoor ambulation over brick and paved surfaces x 3 bouts of 20-35' with Lt. Platform walker and min assist, practiced inclines/declines, brick and paved (and transition) surfaces with cues to avoid occasionally advancing RW too far in front. Educated pt on risks associated with outdoor mobility. Chair close to follow secondary to quick fatigue.   Discussed visualization techniques for phantom pain modulation. Discussed options for home entry: ramp rental, ramp building, wheelchair lift and pt reports he and his wife are considering their options. Also discussed choosing a prosthetics company and outpatient therapies.  Therapy Documentation Precautions:  Precautions Precautions: Back Required Braces or Orthoses: Spinal Brace Knee Immobilizer - Right: On at all times Spinal Brace: Thoracolumbosacral orthotic;Applied in sitting position Restrictions Weight Bearing Restrictions: Yes LUE  Weight Bearing: Weight bear through elbow only RLE Weight Bearing: Non weight bearing Pain: Pain Assessment Pain Score:   6 Pain Type: Acute pain Pain Location: Back Pain Orientation: Right;Lower Pain Descriptors: Aching Pain Onset: On-going Pain Intervention(s): Repositioned (just received pain medication)  See FIM for current functional status  Therapy/Group: Co-Treatment with recreational therapy  Wilhemina Bonito 06/09/2012, 12:28 PM

## 2012-06-09 NOTE — Plan of Care (Signed)
Problem: RH SKIN INTEGRITY Goal: RH STG SKIN FREE OF INFECTION/BREAKDOWN Min A  Outcome: Not Progressing Due to recent fall

## 2012-06-09 NOTE — Progress Notes (Signed)
Occupational Therapy Weekly Progress Note  Patient Details  Name: Dean Bowers MRN: [id number] Date of Birth: Oct 12, 1970  Today's Date: 06/09/2012  Patient has met 3 of 4 short term goals.  Pt has made steady progress with bathing, dressing, and toilet transfers since admission.  Pt is supervision for UB dressing, min A for LB bathing and dressing, steady A for toilet transfers, and max A for toileting.  Pt continues to require min verbal cues for safety awareness and continues to move quickly during transitional movements.   Pt is able to don/doff TLSO while seated EOB or in chair.    Patient continues to demonstrate the following deficits: increased pain, decreased independence with ADLs, decreased independence with transfers, decreased overall activity tolerance/endurance, decreased safety awareness. Therefore, patient will continue to benefit from skilled OT intervention to enhance overall performance with BADL, iADL and Reduce care partner burden.  Patient exceeding LTGs, upgraded some goals to supervision (dynamic standing, LB dressing and added toileting goal of supervision).  OT Short Term Goals Week 1:  OT Short Term Goal 1 (Week 1): Patient will complete 4 of 4 grooming activities seated in w/c independently OT Short Term Goal 1 - Progress (Week 1): Met OT Short Term Goal 2 (Week 1): Patient will dress UB sitting at EOB with steadying assist OT Short Term Goal 2 - Progress (Week 1): Met OT Short Term Goal 3 (Week 1): Patient will complete stand-pivot transfer, bed <> w/c, with appropriate AE, and max assist X1 OT Short Term Goal 3 - Progress (Week 1): Met OT Short Term Goal 4 (Week 1): Patient will bathe 9/10 body parts, seated in w/c, using AE as needed with setup assist OT Short Term Goal 4 - Progress (Week 1): Progressing toward goal  Week 2:  OT Short Term Goal 1 (Week 2): Pt will bathe 9/10 body parts with supervision OT Short Term Goal 2 (Week 2): Pt will perform 2/3  toileting tasks at mod A OT Short Term Goal 3 (Week 2): Pt will perform LB dressing with supervision OT Short Term Goal 4 (Week 2): Pt will perform shower/tub transfer with supervision  Skilled Therapeutic Interventions/Progress Updates:  Balance/vestibular training;Discharge planning;Disease mangement/prevention;DME/adaptive equipment instruction;Functional mobility training;Neuromuscular re-education;Pain management;Patient/family education;Therapeutic Activities;Skin care/wound managment;Self Care/advanced ADL retraining;Therapeutic Exercise;UE/LE Strength taining/ROM;UE/LE Coordination activities;Wheelchair propulsion/positioning;Psychosocial support;Splinting/orthotics;Community reintegration   Precautions:  Precautions Precautions: Back Required Braces or Orthoses: Spinal Brace Knee Immobilizer - Right: On at all times Spinal Brace: Thoracolumbosacral orthotic;Applied in sitting position Restrictions Weight Bearing Restrictions: Yes LUE Weight Bearing: Weight bear through elbow only RLE Weight Bearing: Non weight bearing  Clarinda Obi 06/09/2012, 8:05 AM

## 2012-06-09 NOTE — Progress Notes (Signed)
Physical Therapy Note  Patient Details  Name: Dean Bowers MRN: [id number] Date of Birth: 1970/10/02 Today's Date: 06/09/2012  1115-1155 (40 minutes) individual Pain : No complaint of pain/premedicated Focus of treatment: Bilateral LE strengthening in supine; gait training/endurance Treatment: transfers Lt PFRW SBA wc >< mat; bilateral LE strengthening in supine X 20 - ankle pumps on left, bilateral hip flexion/extension, hip abduction/adduction, SAQs, quad sets (to facilitate extension RT BKA); gait 20 feet LT PFRW SBA.    Inice Dean Bowers,Dean Bowers 06/09/2012, 11:53 AM

## 2012-06-09 NOTE — Progress Notes (Signed)
Recreational Therapy Session Note  Patient Details  Name: LORY GALAN MRN: [id number] Date of Birth: 1970-03-29 Today's Date: 06/09/2012 Time:  [phone] Pain: 6/10 back pain >RLE pain, premedicated Skilled Therapeutic Interventions/Progress Updates: Session focused on activity tolerance, safety/education for community mobility,  ambulation & w/c mobility on uneven community surfaces.  Pt hopped using PFRW on uneven surfaces with min-mod assist short distances negotiating obstacles (changing surface types, cracks in pavement, up & down hill) +2 for safety/following close behind pt with w/c.  Pt propelled w/c throughout hospital either forwards or backwards with supervision.  Pt propelled w/c on uneven outdoor surfaces with min assist and level outdoor surfaces with supervision.  Therapy/Group: Co-Treatment   Kensi Karr 06/09/2012, 10:47 AM

## 2012-06-09 NOTE — Progress Notes (Signed)
Occupational Therapy Session Note  Patient Details  Name: Dean Bowers MRN: [id number] Date of Birth: 1970/07/07  Today's Date: 06/09/2012  Session 1 Time: 0800-0856 Time Calculation (min): 56 min  Short Term Goals: Week 2:  OT Short Term Goal 1 (Week 2): Pt will bathe 9/10 body parts with supervision OT Short Term Goal 2 (Week 2): Pt will perform 2/3 toileting tasks at mod A OT Short Term Goal 3 (Week 2): Pt will perform LB dressing with supervision OT Short Term Goal 4 (Week 2): Pt will perform shower/tub transfer with supervision  Skilled Therapeutic Interventions/Progress Updates:  Pt seated EOB upon arrival and assisted with donning TLSO before transferring to w/c for bathing and dressing.  Pt stated he felt a "twinge" and "pop" in his right lower back during the night when he was rearranging in bed.  MD aware.  Pt able to complete bathing and dressing tasks with sit<>stand from w/c requiring assistance to bathe buttocks secondary to increased pain in lower back with movement.  Pt uses reacher to assist with donning/doffing pants.  Pt stood at sink for approx 3 mins for grooming tasks.  Focus on transfers, activity tolerance, safety awareness, and dynamic standing balance.  Therapy Documentation Precautions:  Precautions Precautions: Back Required Braces or Orthoses: Spinal Brace Knee Immobilizer - Right: On at all times Spinal Brace: Thoracolumbosacral orthotic;Applied in sitting position Restrictions Weight Bearing Restrictions: Yes LUE Weight Bearing: Weight bear through elbow only RLE Weight Bearing: Non weight bearing  Pain: Pain Assessment Pain Assessment: 0-10 Pain Score:   7 Pain Type: Acute pain Pain Location: Back Pain Orientation: Lower;Right Pain Descriptors: Aching;Stabbing Pain Frequency: Intermittent Pain Onset: With Activity Patients Stated Pain Goal: 2 Pain Intervention(s): RN made aware;Repositioned  See FIM for current functional  status  Therapy/Group: Individual Therapy  Session 2 Time: 1430-1500 Pt denies pain Individual Therapy  Pt engaged in practicing transfers and w/c setup inclduing swinging right leg rest out and back in again.  Adaptations made to leg rest to facilitate patient reaching lever.  Pt p[racticed function X 5 during session.  [name] [facility] 06/09/2012, 8:59 AM

## 2012-06-09 NOTE — Progress Notes (Signed)
[facility]     POD 13  contacted Friday afternoon about fall pt had resulting in partial opening of proximal aspect of R BKA wound.  Wound left open and dressed with WTD dressing.  Pt currently up working with therapies. States dressing was just changed this am  Will come back up this afternoon to recheck wound and do another dressing change to look at it.   Continue with therapies   Mearl Latin, PA-C [facility] [phone] (P) 06/09/2012 8:42 AM

## 2012-06-09 NOTE — Progress Notes (Signed)
Occupational Therapy Note  Patient Details  Name: Dean Bowers MRN: [id number] Date of Birth: 1970-08-19 Today's Date: 06/09/2012  Time: 1:45-2:15pm ( ) Pt seen for 1:1 OT session focusing on functional mobility, transfers and functional activity using RUE. Pt maneuvered himself in wheelchair to and from ADL apartment from room. In kitchen pt ambulated with RW while retrieving items from shelves in kitchen with RUE, while adhering to precautions for LUE and back. 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 Pt stood for 4-6 minutes at a time before requiring rest break. Verbal cues not to "plop" down into wheelchair, which pt states is difficulty due to his weight. Overall, pt did well with standing activity and has a great attitude regarding recovery. Pain of 6/10 reported in LLE during session.    Enna Warwick M Ravin Denardo 06/09/2012, 2:26 PM
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
## 2012-06-09 NOTE — Progress Notes (Signed)
Sherran Needs on Friday, WTD dressings, ABx for elevated WBC and erythema R stump  Acute change in the appearance of the flap noted today after fall on Friday and dehiscence of anteromedial limb of wound 4 cm, no expressible purulence; large area of threatened distal flap with ecchymotic, darkened appearance 6 x 5cm; anterior area of erythema and covered weeping linear wound  A: venous congestion of flap with traumatic ecchymosis and also thigh edema  Plan: Continue abx for 7 more days  Elevate stump above heart in bed when not working with PT  Stump shrinker or similar compression to control edema  WTD drsg daily open area of incision  Please call if any questions.  Myrene Galas, MD [facility] [phone] (p)

## 2012-06-10 ENCOUNTER — Inpatient Hospital Stay (HOSPITAL_COMMUNITY): Payer: 59 | Admitting: *Deleted

## 2012-06-10 ENCOUNTER — Inpatient Hospital Stay (HOSPITAL_COMMUNITY): Payer: 59

## 2012-06-10 LAB — CBC
Hemoglobin: 8.1 g/dL — ABNORMAL LOW (ref 13.0–17.0)
MCV: 86.9 fL (ref 78.0–100.0)
Platelets: 450 10*3/uL — ABNORMAL HIGH (ref 150–400)
RBC: 2.97 MIL/uL — ABNORMAL LOW (ref 4.22–5.81)
WBC: 11.7 10*3/uL — ABNORMAL HIGH (ref 4.0–10.5)

## 2012-06-10 MED ORDER — BETHANECHOL CHLORIDE 25 MG PO TABS
25.0000 mg | ORAL_TABLET | Freq: Two times a day (BID) | ORAL | Status: DC
  Administered 2012-06-10 – 2012-06-13 (×7): 25 mg via ORAL
  Filled 2012-06-10 (×9): qty 1

## 2012-06-10 NOTE — Progress Notes (Signed)
Recreational Therapy Session Note  Patient Details  Name: Dean Bowers MRN: [id number] Date of Birth: Jan 31, 1971 Today's Date: 06/10/2012 Time:  [phone] Pain: no c/o, c/o discomfort in w/c- new w/c given Skilled Therapeutic Interventions/Progress Updates: Session focused on discharge planning specifically pertaining to home access.  Pt & wife with questions about w/c lift versus ramp purchase/rental.  Discussed options with SW and information provided to pt & wife.  Also discussed need for home measurements including doorways in which pt's wife stated she would measure ASAP.  Switched out w/c to greater seat width during session due to discomfort in R thigh in current chair.   Therapy/Group: Co-Treatment  Jahon Bart 06/10/2012, 3:17 PM
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
## 2012-06-10 NOTE — Progress Notes (Signed)
Occupational Therapy Note  Patient Details  Name: RAEQWON LUX MRN: [id number] Date of Birth: Jan 05, 1971 Today's Date: 06/10/2012  Time: 10:37-11:15am ( ) Pt seen for 1:1 OT session with focus on transfers, activity/standing tolerance and overall safety. Pt had just finished PT session and was fatigued, but ready to work. Pt maneuvered himself in wheelchair to ADL apartment from his room where he rehearsed a tub transfer with CGA. Verbal cues not to twist back when scooting onto tub bench. Also rehearsed car transfer with pt using walker to get to car. In gym, pt completed "pipe maze" with RUE, leaning on L elbow in standing. Pt required 3 rest breaks while completing 3 different patterns. Pt returned to room with wife present and left call bell within reach. Pt reported 6/10 pain in back today, nursing made aware.   Alannis Hsia Hessie Diener 06/10/2012, 12:31 PM

## 2012-06-10 NOTE — Progress Notes (Signed)
Occupational Therapy Session Note  Patient Details  Name: Dean Bowers MRN: [id number] Date of Birth: 22-Dec-1970  Today's Date: 06/10/2012  Session 1 Time: 0700-0757 Time Calculation (min): 57 min  Short Term Goals: Week 2:  OT Short Term Goal 1 (Week 2): Pt will bathe 9/10 body parts with supervision OT Short Term Goal 2 (Week 2): Pt will perform 2/3 toileting tasks at mod A OT Short Term Goal 3 (Week 2): Pt will perform LB dressing with supervision OT Short Term Goal 4 (Week 2): Pt will perform shower/tub transfer with supervision  Skilled Therapeutic Interventions/Progress Updates:    Pt resting in bed upon arrival.  Pt sat EOB to don TLSO prior to stand pivot transfer to w/c for bathing and dressing.  Pt continues to exhibit difficulty with cleaning buttocks.  Pt uses long handle sponge to bathe left foot and reacher and sock aid to perform LB dressing.  Pt required assistance this morning only with bathing buttocks.  All other tasks performed with supervision.  Discussed DME needs/recommendations at home.  Recommended BSC and tub bench.  Therapy Documentation Precautions:  Precautions Precautions: Back Required Braces or Orthoses: Spinal Brace Knee Immobilizer - Right: On at all times Spinal Brace: Thoracolumbosacral orthotic;Applied in sitting position Restrictions Weight Bearing Restrictions: Yes LUE Weight Bearing: Weight bear through elbow only RLE Weight Bearing: Non weight bearing Pain: Pain Assessment Pain Score:   7 Pain Location: Leg Pain Orientation: Right Pain Descriptors: Aching Pain Onset: On-going Patients Stated Pain Goal: 2 Pain Intervention(s): RN made aware;Repositioned  See FIM for current functional status  Therapy/Group: Individual Therapy  Session 2 Time: 1415-1445 Pt denies pain Individual therapy  Pt rolled to gym for practicing transfers and standing tasks to simulate reaching to buttocks while standing.  Pt issued toilet aid to assess  practicality for toileting.  Pt practiced tub transfers to tub transfer bench.  Focus on discharge planning, standing balance, activity tolerance, and safety awareness  Rich Brave 06/10/2012, 7:58 AM

## 2012-06-10 NOTE — Progress Notes (Signed)
Physical Therapy Weekly Progress Note  Patient Details  Name: Dean Bowers MRN: [id number] Date of Birth: 01/01/1971  Today's Date: 06/10/2012 Time: 4098-1191 Time Calculation (min): 65 min  Patient has met 3 of 4 short term goals.  Sliding board goal discharged as pt now performs stand pivot transfers.   Patient continues to demonstrate the following deficits: decreased endurance, decreased functional mobility, decreased functional balance, decreased shoulder girdle strength which creates a pounding effect through his body with his hop gait and therefore will continue to benefit from skilled PT intervention to enhance overall performance with activity tolerance, balance and ability to compensate for deficits.  Patient progressing toward long term goals..  Continue plan of care.  PT Short Term Goals Week 1:  PT Short Term Goal 1 (Week 1): Patient will be able to p-erform sit to stand transfer with modA. PT Short Term Goal 1 - Progress (Week 1): Met PT Short Term Goal 2 (Week 1): Patient will be able to perform supine to sit with SUP. PT Short Term Goal 2 - Progress (Week 1): Met PT Short Term Goal 3 (Week 1): Patient will bve able to tolerate sitting position in w/c ,for up to 3 hrs w/o signs of exertion. PT Short Term Goal 3 - Progress (Week 1): Met PT Short Term Goal 4 (Week 1): Patient will be able to perform transfer bed to w/c with sliding board with mod A. PT Short Term Goal 4 - Progress (Week 1): Discontinued (comment) (pt performing stand pivot transfers)  Skilled Therapeutic Interventions/Progress Updates:    Discussed home management, discussed ramp vs. elevator for home entry, pt reports he is comparing prices. Wife to get doorway and hallway measurements for wheelchair measurements. Pt with c/o current wheelchair being too tight, provided pt with a wider wheelchair, pt reports significantly improved comfort. Core stability and strengthening with light weight ball toss with Rt.  UE while seated on dyna disc, no foot support. Scapular pushups 3 x 10 reps in standing with platform walker. Hamstring stretch with hip extended and flexed to improve Rt. Knee extension ROM. Visualization exercises practiced again. Ambulation 2 x 30' with platform RW and min assist, cues for controled eccentric control to decrease pounding effect through spine.   Therapy Documentation Precautions:  Precautions Precautions: Back Required Braces or Orthoses: Spinal Brace Knee Immobilizer - Right: On at all times Spinal Brace: Thoracolumbosacral orthotic;Applied in sitting position Restrictions Weight Bearing Restrictions: Yes LUE Weight Bearing: Weight bear through elbow only RLE Weight Bearing: Non weight bearing Pain: Pain Assessment Pain Score:   6 Pain Type: Acute pain Pain Location: Leg Pain Orientation: Right Pain Descriptors: Aching Pain Onset: On-going (better than yesterday) Pain Intervention(s): Repositioned  See FIM for current functional status  Therapy/Group: Co-Treatment with recreational therapy  Wilhemina Bonito 06/10/2012, 12:18 PM

## 2012-06-11 ENCOUNTER — Inpatient Hospital Stay (HOSPITAL_COMMUNITY): Payer: 59 | Admitting: *Deleted

## 2012-06-11 ENCOUNTER — Inpatient Hospital Stay (HOSPITAL_COMMUNITY): Payer: 59 | Admitting: Physical Therapy

## 2012-06-11 ENCOUNTER — Inpatient Hospital Stay (HOSPITAL_COMMUNITY): Payer: 59

## 2012-06-11 MED ORDER — CEPHALEXIN 500 MG PO CAPS
500.0000 mg | ORAL_CAPSULE | Freq: Three times a day (TID) | ORAL | Status: DC
  Administered 2012-06-11 – 2012-06-13 (×7): 500 mg via ORAL
  Filled 2012-06-11 (×10): qty 1
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
## 2012-06-11 NOTE — Progress Notes (Signed)
Occupational Therapy Session Note  Patient Details  Name: Dean Bowers MRN: [id number] Date of Birth: 1970-05-09  Today's Date: 06/11/2012 Time: 0700-0755 Time Calculation (min): 55 min  Short Term Goals: Week 2:  OT Short Term Goal 1 (Week 2): Pt will bathe 9/10 body parts with supervision OT Short Term Goal 2 (Week 2): Pt will perform 2/3 toileting tasks at mod A OT Short Term Goal 3 (Week 2): Pt will perform LB dressing with supervision OT Short Term Goal 4 (Week 2): Pt will perform shower/tub transfer with supervision  Skilled Therapeutic Interventions/Progress Updates:    Pt engaged in bathing and dressing task w/c level at sink.  Pt attempted to use adaptive toilet aid for toilet hygiene.  Pt stated that he didn't feel like it was through enough.  Pt did agree that it would be useful for washing buttocks.  Pt completed all tasks with supervision with occasional verbal cues for safety.  Pt uses reacher, sock aid, and long handle sponge appropriately.  Focus on safety awareness, AE use, and dynamic standing balance.  Therapy Documentation Precautions:  Precautions Precautions: Back Required Braces or Orthoses: Spinal Brace Knee Immobilizer - Right: On at all times Spinal Brace: Thoracolumbosacral orthotic;Applied in sitting position Restrictions Weight Bearing Restrictions: Yes LUE Weight Bearing: Weight bear through elbow only RLE Weight Bearing: Non weight bearing Pain: Pain Assessment Pain Score:   5 Pain Type: Acute pain Pain Location: Leg Pain Orientation: Right Pain Descriptors: Throbbing Pain Onset: On-going Patients Stated Pain Goal: 2 Pain Intervention(s): RN made aware;Repositioned  See FIM for current functional status  Therapy/Group: Individual Therapy  Rich Brave 06/11/2012, 7:58 AM

## 2012-06-11 NOTE — Progress Notes (Signed)
Physical Therapy Session Note  Patient Details  Name: Dean Bowers MRN: [id number] Date of Birth: 1971-01-27  Today's Date: 06/11/2012 Time: 1130-1200 Time Calculation (min): 30 min  Short Term Goals: Week 2:  PT Short Term Goal 1 (Week 2): = long term goals  Skilled Therapeutic Interventions/Progress Updates:    Session focused on furniture transfer from semi recliner chair in ADL apartment and low couch with overall S using PFRW; pt verbalizing safe technique during all transfers. LE therex on residual limb for knee flexion/extension, sidelying hip abduction and extension x 15 reps each. Mod I w/c propulsion and managing w/c parts independently.   Therapy Documentation Precautions:  Precautions Precautions: Back Required Braces or Orthoses: Spinal Brace Knee Immobilizer - Right: On at all times Spinal Brace: Thoracolumbosacral orthotic;Applied in sitting position Restrictions Weight Bearing Restrictions: Yes LUE Weight Bearing: Weight bear through elbow only RLE Weight Bearing: Non weight bearing   Pain: Denies pain.  See FIM for current functional status  Therapy/Group: Individual Therapy  Karolee Stamps [facility] 06/11/2012, 12:07 PM

## 2012-06-11 NOTE — Progress Notes (Signed)
Physical Therapy Note  Patient Details  Name: Dean Bowers MRN: [id number] Date of Birth: November 23, 1970 Today's Date: 06/11/2012  8295-6213 (55 minutes) individual ([place] with RT) Pain: no reported pain Focus of treatment: Sit to stand from raised mat without UE assist for RT quad strengthening; gait training (sideways) to enter narrow door; therapeutic exercise focused on activity tolerance using RT UE Treatment: Pt up in wc; transfers stand/turn LT PFRW SBA wc > mat ; sit to stand from raised mat without using UE assist for RT quad strengthening; gait using AD as above sideways along mat X 2 for LE Strengthening to enter bathroom ; UE ergonometer X 10 minutes using RT UE with 3 rest breaks (fatigue)   1600-1640 (40 minutes) individual Pain: no complaint of pain Focus of treatment: Therapeutic exercise focused on activity tolerance; gait training (up/down small step) Treatment: Transfers stand/turn RW SBA; Nustep Level 5 X 10 minutes without rest break; Up/down 2 inch step height with LT PFRW support 3 X 3.    Chez Bulnes,JIM 06/11/2012, 9:45 AM

## 2012-06-11 NOTE — Progress Notes (Signed)
Recreational Therapy Session Note  Patient Details  Name: Dean Bowers MRN: [id number] Date of Birth: [birth year] Today's Date: 06/11/2012 Pain: no c/o  Pt participated in animal assisted activity/therapy w/c level with Mod I.   [name] 06/11/2012, 5:28 PM
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
## 2012-06-12 ENCOUNTER — Inpatient Hospital Stay (HOSPITAL_COMMUNITY): Payer: 59

## 2012-06-12 ENCOUNTER — Inpatient Hospital Stay (HOSPITAL_COMMUNITY): Payer: 59 | Admitting: Physical Therapy

## 2012-06-12 LAB — CBC
HCT: 26.8 % — ABNORMAL LOW (ref 39.0–52.0)
Hemoglobin: 8.3 g/dL — ABNORMAL LOW (ref 13.0–17.0)
MCHC: 31 g/dL (ref 30.0–36.0)
WBC: 8.3 10*3/uL (ref 4.0–10.5)

## 2012-06-12 NOTE — Progress Notes (Addendum)
Occupational Therapy Session Note  Patient Details  Name: Dean Bowers MRN: [id number] Date of Birth: [birth year]  Today's Date: 06/12/2012  Session 1 Time: 0700-0757 Time Calculation (min): 57 min  Short Term Goals: Week 2:  OT Short Term Goal 1 (Week 2): Pt will bathe 9/10 body parts with supervision OT Short Term Goal 1 - Progress (Week 2): Met OT Short Term Goal 2 (Week 2): Pt will perform 2/3 toileting tasks at mod A OT Short Term Goal 2 - Progress (Week 2): Met OT Short Term Goal 3 (Week 2): Pt will perform LB dressing with supervision OT Short Term Goal 3 - Progress (Week 2): Met OT Short Term Goal 4 (Week 2): Pt will perform shower/tub transfer with supervision OT Short Term Goal 4 - Progress (Week 2): Met  Skilled Therapeutic Interventions/Progress Updates:    Pt engaged in bathing and dressing tasks with sit<>stand from w/c at sink.  Pt is able to stand at sink without UE support to use towel to wipe/clean bottom.  Pt uses reacher, long handle sponge, and sock aid to assist with bathing and dressing tasks.  Pt practiced toilet transfers and toileting.  Pt amb with PFRW into bathroom with supervision. Discussed discharge plans and safety at home.  Pt completed all tasks at supervision level.  Focus on activity tolerance, functional ambulation with PFRW, safety awareness, dynamic standing balance, AE use, and discharge planning.  Therapy Documentation Precautions:  Precautions Precautions: Back Required Braces or Orthoses: Spinal Brace Knee Immobilizer - Right: On at all times Spinal Brace: Thoracolumbosacral orthotic;Applied in sitting position Restrictions Weight Bearing Restrictions: Yes LUE Weight Bearing: Weight bear through elbow only RLE Weight Bearing: Non weight bearing Pain: Pain Assessment Pain Assessment: 0-10 Pain Score:   6 Pain Type: Acute pain;Phantom pain Pain Location: Leg Pain Orientation: Right Pain Descriptors: Aching Pain Onset:  On-going Patients Stated Pain Goal: 2 Pain Intervention(s): RN made aware;Repositioned  See FIM for current functional status  Therapy/Group: Individual Therapy  Session 2 Time; 1330-1415 Pt denies pain Individual therapy Pt missed 15 mins skilled OT services secondary to fatigue. Pt engaged in dynamic standing activities playing Wii baseball.  Pt required rest breaks X 4 during activity.  Pt required supervision with occasional steady assist as patient fatigued.  Pt requested rest breaks appropriately throughout session.  Continued discharge planning and safety at home.  Rolled to kitchen to discuss and practice safe ambulation with PFRW for functional tasks.  [name] [facility] 06/12/2012, 8:02 AM
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
## 2012-06-12 NOTE — Progress Notes (Signed)
Occupational Therapy Discharge Summary  Patient Details  Name: Dean Bowers MRN: [id number] Date of Birth: [birth year]  Today's Date: 06/12/2012  Patient has met 9 of 9 long term goals due to improved activity tolerance, improved balance, postural control, ability to compensate for deficits, functional use of  RIGHT upper, LEFT upper and LEFT lower extremity, improved attention, improved awareness and improved coordination.  Pt has made excellent progress with bathing, dressing, toilet transfers, toileting, and tub bench transfers during this admission.  Pt ambulates with PFRW to access bathroom and perform simple kitchen tasks at supervision level. Pt's wife has been present for therapy sessions. Patient to discharge at overall Supervision level.  Patient's care partner is independent to provide the necessary supervision and min assist prn assistance at discharge.    Recommendation:  Patient will benefit from ongoing skilled OT services in home health setting to continue to advance functional skills in the area of BADL, iADL and Reduce care partner burden.  Equipment: Drop Arm BSC and Tub Transfer Bench  Reasons for discharge: treatment goals met and discharge from hospital  Patient/family agrees with progress made and goals achieved: Yes  ADL ADL Equipment Provided: Reacher;Sock aid;Long-handled sponge Eating: Independent Where Assessed-Eating: Wheelchair Grooming: Independent Where Assessed-Grooming: Wheelchair Upper Body Bathing: Modified independent Where Assessed-Upper Body Bathing: Wheelchair;Sitting at sink Lower Body Bathing: Supervision/safety Where Assessed-Lower Body Bathing: Sitting at sink;Standing at sink;Wheelchair Upper Body Dressing: Modified independent (Device) Where Assessed-Upper Body Dressing: Sitting at sink;Wheelchair Lower Body Dressing: Supervision/safety Where Assessed-Lower Body Dressing: Standing at sink;Sitting at sink;Wheelchair Toileting:  Supervision/safety Where Assessed-Toileting: Teacher, adult education: Close supervision Toilet Transfer Method: Proofreader: Engineer, technical sales: Close supervison Web designer Method: Ship broker: Emergency planning/management officer Vision/Perception  Vision - History Baseline Vision: No visual deficits Patient Visual Report: No change from baseline Vision - Assessment Eye Alignment: Within Functional Limits Vision Assessment: Vision not tested Perception Perception: Within Functional Limits Praxis Praxis: Intact   Cognition Overall Cognitive Status: Within Functional Limits for tasks assessed Arousal/Alertness: Awake/alert Orientation Level: Oriented X4 Attention: Alternating Focused Attention: Appears intact Focused Attention Impairment: Verbal basic;Functional basic Selective Attention: Appears intact Alternating Attention: Appears intact Memory: Appears intact Awareness: Appears intact Awareness Impairment: Anticipatory impairment Problem Solving: Appears intact Problem Solving Impairment: Verbal basic;Functional basic  Sensation Sensation Light Touch: Appears Intact Stereognosis: Appears Intact Hot/Cold: Appears Intact Proprioception: Appears Intact  Trunk/Postural Assessment  Cervical Assessment Cervical Assessment: Within Functional Limits Thoracic Assessment Thoracic Assessment: Exceptions to WFL (TLSO) Lumbar Assessment Lumbar Assessment: Exceptions to WFL (TLSO) Postural Control Postural Control: Within Functional Limits   Balance Static Sitting Balance Static Sitting - Balance Support: Feet supported  Extremity/Trunk Assessment RUE Assessment RUE Assessment: Within Functional Limits LUE Assessment LUE Assessment: Exceptions to [facility] (NWB except through elbow)  See FIM for current functional status  Rich Brave 06/12/2012, 3:05 PM

## 2012-06-12 NOTE — Progress Notes (Signed)
Physical Therapy Session Note  Patient Details  Name: Dean Bowers MRN: [id number] Date of Birth: 1970-12-05  Today's Date: 06/12/2012 Time: 1030-1130 Time Calculation (min): 60 min  Short Term Goals: Week 1:  PT Short Term Goal 1 (Week 1): Patient will be able to p-erform sit to stand transfer with modA. PT Short Term Goal 1 - Progress (Week 1): Met PT Short Term Goal 2 (Week 1): Patient will be able to perform supine to sit with SUP. PT Short Term Goal 2 - Progress (Week 1): Met PT Short Term Goal 3 (Week 1): Patient will bve able to tolerate sitting position in w/c ,for up to 3 hrs w/o signs of exertion. PT Short Term Goal 3 - Progress (Week 1): Met PT Short Term Goal 4 (Week 1): Patient will be able to perform transfer bed to w/c with sliding board with mod A. PT Short Term Goal 4 - Progress (Week 1): Discontinued (comment) (pt performing stand pivot transfers)  Skilled Therapeutic Interventions/Progress Updates:   Pt measured for wheelchair. Session primarily focused on family education, wife and father in-law educated on positioning for supervision with gait and transfers (controlled, home, car). Educated wife on folding and transporting wheelchair, curb bump ups. Educated pt and wife on ascending small step ups with platform walker. Pt performed once with PT, once with wife providing close supervision. Recommended pt and wife get gait belt. Discussed safety at home once pt D/Cs. Pt requested more exercises, hand outs provided for pre-prosthetic LE strengthening to promote quad and glute strength/ knee ROM.   Therapy Documentation Precautions:  Precautions Precautions: Back Required Braces or Orthoses: Spinal Brace Knee Immobilizer - Right: On at all times Spinal Brace: Thoracolumbosacral orthotic;Applied in sitting position Restrictions Weight Bearing Restrictions: Yes LUE Weight Bearing: Weight bear through elbow only RLE Weight Bearing: Non weight bearing Pain: Pain  Assessment Pain Assessment: No/denies pain  See FIM for current functional status  Therapy/Group: Individual Therapy  Wilhemina Bonito 06/12/2012, 12:34 PM

## 2012-06-12 NOTE — Progress Notes (Signed)
Physical Therapy Note  Patient Details  Name: Dean Bowers MRN: [id number] Date of Birth: Nov 16, 1970 Today's Date: 06/12/2012  Time: 1300-1330 30 minutes  No c/o pain.  Pt performed gait training with PFRW 72', 45' with mod I.  All transfers mod I with PFRW.  Nu step for activity tolerance and endurance x 10 minutes.  Pt reports he feels comfortable and ready to d/c home tomorrow.  Individual therapy   DONAWERTH,KAREN 06/12/2012, 1:54 PM

## 2012-06-12 NOTE — Progress Notes (Signed)
Physical Therapy Discharge Summary  Patient Details  Name: Dean Bowers MRN: [id number] Date of Birth: Apr 17, 1970  Today's Date: 06/12/2012  Patient has met 9 of 9 long term goals due to improved activity tolerance, improved balance, increased strength, decreased pain and ability to compensate for deficits.  Patient to discharge at a wheelchair and ambulatory level Supervision.   Patient's care partner is independent to provide the necessary physical assistance at discharge. Pt now has a rental ramp providing adequate access to his home.   Reasons goals not met: NA  Recommendation:  Patient will benefit from ongoing skilled PT services in home health setting to continue to advance safe functional mobility, address ongoing impairments in endurance, functional mobility, balance, preprosthetic training, and minimize fall risk.  Equipment: wheelchair, platform walker  Reasons for discharge: treatment goals met and discharge from hospital  Patient/family agrees with progress made and goals achieved: Yes  PT Discharge Precautions/Restrictions Precautions Required Braces or Orthoses: Spinal Brace Spinal Brace: Thoracolumbosacral orthotic;Applied in sitting position Restrictions Weight Bearing Restrictions: Yes LUE Weight Bearing: Weight bear through elbow only RLE Weight Bearing: Non weight bearing Vital Signs Therapy Vitals Temp: 98.7 F (37.1 C) Temp src: Oral Pulse Rate: 101 (Pt just returned from outside) Resp: 20 BP: 110/72 mmHg Patient Position, if appropriate: Sitting Oxygen Therapy SpO2: 95 % O2 Device: None (Room air) Pain Pain Assessment Pain Assessment: 0-10 Pain Score:   6 Pain Type: Acute pain Pain Location: Back Pain Onset: On-going Patients Stated Pain Goal: 2 Pain Intervention(s): Medication (See eMAR) Vision/Perception  Vision - History Baseline Vision: No visual deficits Patient Visual Report: No change from baseline Vision - Assessment Eye  Alignment: Within Functional Limits Vision Assessment: Vision not tested Perception Perception: Within Functional Limits Praxis Praxis: Intact  Cognition Overall Cognitive Status: Within Functional Limits for tasks assessed Arousal/Alertness: Awake/alert Orientation Level: Oriented X4 Attention: Alternating Focused Attention: Appears intact Focused Attention Impairment: Verbal basic;Functional basic Selective Attention: Appears intact Alternating Attention: Appears intact Memory: Appears intact Awareness: Appears intact Awareness Impairment: Anticipatory impairment Problem Solving: Appears intact Problem Solving Impairment: Verbal basic;Functional basic Sensation Sensation Light Touch: Appears Intact Stereognosis: Appears Intact Hot/Cold: Appears Intact Proprioception: Appears Intact Coordination Gross Motor Movements are Fluid and Coordinated: Yes Fine Motor Movements are Fluid and Coordinated: Yes  Mobility Bed Mobility Rolling Right: 6: Modified independent (Device/Increase time) Right Sidelying to Sit: 6: Modified independent (Device/Increase time) Supine to Sit: 6: Modified independent (Device/Increase time) Sit to Supine: 6: Modified independent (Device/Increase time) Sit to Sidelying Right: 6: Modified independent (Device/Increase time) Transfers Sit to Stand: 6: Modified independent (Device/Increase time) Stand to Sit: 6: Modified independent (Device/Increase time) Stand Pivot Transfers: 5: Supervision Stand Pivot Transfer Details (indicate cue type and reason): supervision for set up/safety Locomotion  Ambulation Ambulation: Yes Ambulation/Gait Assistance: 5: Supervision Ambulation Distance (Feet): 70 Feet Assistive device: Left platform walker Ambulation/Gait Assistance Details: Verbal cues for increased bil. UE use to decrease "pounding" through LE and spine Gait Gait: Yes Gait Pattern: Impaired Gait Pattern:  ("hop" gait, poor foot clearance) High  Level Ambulation High Level Ambulation: Backwards walking Backwards Walking: supervision Stairs / Additional Locomotion Stairs: Yes Stairs Assistance: 4: Min assist Stairs Assistance Details (indicate cue type and reason): Step by step cues for small step up, cues for safe management of RW, proximity of Lt. foot. Stair Management Technique: Forwards;With walker Number of Stairs: 1 Wheelchair Mobility Wheelchair Mobility: Yes Wheelchair Assistance: 6: Modified independent (Device/Increase time) Wheelchair Propulsion: Right upper extremity;Left lower extremity Wheelchair  Parts Management: Supervision/cueing Distance: >500'  Trunk/Postural Assessment  Cervical Assessment Cervical Assessment: Within Functional Limits Thoracic Assessment Thoracic Assessment: Exceptions to WFL (TLSO) Lumbar Assessment Lumbar Assessment: Exceptions to WFL (TLSO) Postural Control Postural Control: Within Functional Limits  Balance Static Sitting Balance Static Sitting - Balance Support: Feet supported Static Sitting - Level of Assistance: 6: Modified independent (Device/Increase time) Dynamic Sitting Balance Dynamic Sitting - Level of Assistance: 6: Modified independent (Device/Increase time) Extremity Assessment  RUE Assessment RUE Assessment: Within Functional Limits LUE Assessment LUE Assessment: Exceptions to [facility] (NWB except through elbow) RLE Assessment RLE Assessment: Exceptions to [facility] (Decreased generalized strength of residual musculature) LLE Assessment LLE Assessment: Within Functional Limits  See FIM for current functional status  Wilhemina Bonito 06/12/2012, 3:38 PM
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
## 2012-06-12 NOTE — Progress Notes (Signed)
Social Work Patient ID: Dean Bowers, male   DOB: 02/22/1970, 42 y.o.   MRN: [id number]  Have discussed team conference info with pt and wife with original d/c date moved to 5/10, however, team and MD now reporting they feel they can move to 5/9.  Pt and wife very agreeable.  Rental ramp is in place at their home.  Family education being completed.  HH and DME in place.  Plan for d/c tomorrow.  Kerin Cecchi, LCSW

## 2012-06-12 NOTE — Plan of Care (Signed)
Problem: RH PAIN MANAGEMENT Goal: RH STG PAIN MANAGED AT OR BELOW PT'S PAIN GOAL 4 or less  Outcome: Not Progressing Complained of pain rating of 6 or greater

## 2012-06-12 NOTE — Progress Notes (Signed)
Recreational Therapy Discharge Summary Patient Details  Name: MORIAH LOUGHRY MRN: [id number] Date of Birth: [birth year] Today's Date: 06/12/2012  Long term goals set: 1  Long term goals met: 1   Comments on progress toward goals: Pt has been extremely hard working & motivated throughout LOS.  Pt is ready for discharge home at Mod I w/c level for TR tasks and for community pursuits on level surfaces.  Pt does requires supervision/min cues for safety on uneven surfaces at times.   Reasons for discharge: discharge from hospitalPatient/family agrees with progress made and goals achieved: Yes  Ivon Roedel 06/12/2012, 4:48 PM

## 2012-06-12 NOTE — Discharge Summary (Signed)
  Discharge summary job # [id number]

## 2012-06-13 LAB — CREATININE, SERUM
GFR calc Af Amer: >90 mL/min (ref >90)
GFR calc non Af Amer: >90 mL/min (ref >90)

## 2012-06-13 MED ORDER — ONE-DAILY MULTI VITAMINS PO TABS: 1.0000 | ORAL_TABLET | Freq: Every day | ORAL | Status: DC

## 2012-06-13 MED ORDER — METHOCARBAMOL 500 MG PO TABS: 500.0000 mg | ORAL_TABLET | Freq: Four times a day (QID) | ORAL | Status: DC | PRN

## 2012-06-13 MED ORDER — CEPHALEXIN 500 MG PO CAPS: 500.0000 mg | ORAL_CAPSULE | Freq: Three times a day (TID) | ORAL | Status: DC

## 2012-06-13 MED ORDER — PRAVASTATIN SODIUM 40 MG PO TABS: 40.0000 mg | ORAL_TABLET | Freq: Every day | ORAL | Status: DC

## 2012-06-13 MED ORDER — OXYCODONE HCL ER 20 MG PO T12A: 20.0000 mg | EXTENDED_RELEASE_TABLET | Freq: Two times a day (BID) | ORAL | Status: DC

## 2012-06-13 MED ORDER — ASCORBIC ACID 500 MG PO TABS: 500.0000 mg | ORAL_TABLET | Freq: Every day | ORAL | Status: DC

## 2012-06-13 MED ORDER — OXYCODONE HCL 10 MG PO TABS: 10.0000 mg | ORAL_TABLET | ORAL | Status: DC | PRN

## 2012-06-13 MED ORDER — ACETAMINOPHEN 325 MG PO TABS: 325.0000 mg | ORAL_TABLET | ORAL | Status: DC | PRN

## 2012-06-13 MED ORDER — GABAPENTIN 300 MG PO CAPS: 300.0000 mg | ORAL_CAPSULE | Freq: Three times a day (TID) | ORAL | Status: DC

## 2012-06-13 MED ORDER — ALPRAZOLAM 0.5 MG PO TABS: 0.5000 mg | ORAL_TABLET | Freq: Three times a day (TID) | ORAL | Status: DC | PRN

## 2012-06-13 NOTE — Progress Notes (Signed)
Pt discharged at 1050 with family to home. Discharge instructions given by PA with verbal understanding. Belongings with pt and family. No further questions at this time.
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
## 2012-06-13 NOTE — Progress Notes (Signed)
Social Work  Discharge Note  The overall goal for the admission was met for:   Discharge location: Yes - home with family and friends to provide assist  Length of Stay: Yes - 14 days  Discharge activity level: Yes   Home/community participation: Yes  Services provided included: MD, RD, PT, OT, RN, TR, Pharmacy and SW  Financial Services: Private Insurance: [facility] and [employer]  Follow-up services arranged: Home Health: RN, PT, OT via [facility], DME: 22x18 w/c with right amputee pad, ELR and back + seat cushions, rolling walker with left platform attachment, drop arm commode, tub bench via [facility] and Patient/Family has no preference for HH/DME agencies  Comments (or additional information):  Patient/Family verbalized understanding of follow-up arrangements: Yes  Individual responsible for coordination of the follow-up plan: patient  Confirmed correct DME delivered: Dean Bowers 06/13/2012    Dean Bowers

## 2012-06-24 ENCOUNTER — Encounter: Payer: Self-pay | Admitting: Family Medicine

## 2012-06-24 ENCOUNTER — Ambulatory Visit (INDEPENDENT_AMBULATORY_CARE_PROVIDER_SITE_OTHER): Payer: 59 | Admitting: Family Medicine

## 2012-06-24 VITALS — BP 126/82 | HR 86

## 2012-06-24 DIAGNOSIS — S7290XD Unspecified fracture of unspecified femur, subsequent encounter for closed fracture with routine healing: Secondary | ICD-10-CM

## 2012-06-24 DIAGNOSIS — Z5189 Encounter for other specified aftercare: Secondary | ICD-10-CM

## 2012-06-24 DIAGNOSIS — S7291XD Unspecified fracture of right femur, subsequent encounter for closed fracture with routine healing: Secondary | ICD-10-CM

## 2012-06-24 DIAGNOSIS — S98911D Complete traumatic amputation of right foot, level unspecified, subsequent encounter: Secondary | ICD-10-CM

## 2012-06-24 DIAGNOSIS — D62 Acute posthemorrhagic anemia: Secondary | ICD-10-CM
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
## 2012-07-09 ENCOUNTER — Encounter: Payer: Self-pay | Admitting: Internal Medicine

## 2012-07-09 ENCOUNTER — Telehealth: Payer: Self-pay | Admitting: Physical Medicine & Rehabilitation

## 2012-07-09 ENCOUNTER — Encounter: Payer: 59 | Attending: Physical Medicine & Rehabilitation | Admitting: Physical Medicine & Rehabilitation

## 2012-07-09 ENCOUNTER — Encounter: Payer: Self-pay | Admitting: Physical Medicine & Rehabilitation

## 2012-07-09 DIAGNOSIS — S5290XD Unspecified fracture of unspecified forearm, subsequent encounter for closed fracture with routine healing: Secondary | ICD-10-CM

## 2012-07-09 DIAGNOSIS — S52202D Unspecified fracture of shaft of left ulna, subsequent encounter for closed fracture with routine healing: Secondary | ICD-10-CM

## 2012-07-09 DIAGNOSIS — S88119A Complete traumatic amputation at level between knee and ankle, unspecified lower leg, initial encounter: Secondary | ICD-10-CM | POA: Insufficient documentation

## 2012-07-09 DIAGNOSIS — S98911D Complete traumatic amputation of right foot, level unspecified, subsequent encounter: Secondary | ICD-10-CM

## 2012-07-09 DIAGNOSIS — Z5189 Encounter for other specified aftercare: Secondary | ICD-10-CM

## 2012-07-09 DIAGNOSIS — G547 Phantom limb syndrome without pain: Secondary | ICD-10-CM | POA: Insufficient documentation

## 2012-07-09 DIAGNOSIS — S7291XD Unspecified fracture of right femur, subsequent encounter for closed fracture with routine healing: Secondary | ICD-10-CM

## 2012-07-09 DIAGNOSIS — IMO0002 Reserved for concepts with insufficient information to code with codable children: Secondary | ICD-10-CM

## 2012-07-09 DIAGNOSIS — S7290XD Unspecified fracture of unspecified femur, subsequent encounter for closed fracture with routine healing: Secondary | ICD-10-CM

## 2012-07-09 DIAGNOSIS — S22089D Unspecified fracture of T11-T12 vertebra, subsequent encounter for fracture with routine healing: Secondary | ICD-10-CM

## 2012-07-09 NOTE — Telephone Encounter (Signed)
I received correspondence from Dr. Carola Frost that Mr. Dean Bowers would not be following up with [place] per his preference (doesn't state why).  Handy filled pain medication. He is on the schedule today. I will not fill any further meds or complete any further orders for him without a follow up visit here
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
## 2012-07-09 NOTE — Patient Instructions (Signed)
CALL ME WITH QUESTIONS 

## 2012-07-09 NOTE — Telephone Encounter (Signed)
Patient has appointment 07/09/2012

## 2012-07-14 ENCOUNTER — Other Ambulatory Visit (HOSPITAL_COMMUNITY): Payer: Self-pay | Admitting: Plastic Surgery

## 2012-07-14 ENCOUNTER — Encounter (HOSPITAL_BASED_OUTPATIENT_CLINIC_OR_DEPARTMENT_OTHER): Payer: 59 | Attending: Plastic Surgery

## 2012-07-14 ENCOUNTER — Ambulatory Visit (HOSPITAL_COMMUNITY)
Admission: RE | Admit: 2012-07-14 | Discharge: 2012-07-14 | Disposition: A | Discharge status: Home / Self Care | Payer: 59 | Source: Ambulatory Visit | Attending: Plastic Surgery | Admitting: Plastic Surgery

## 2012-07-14 DIAGNOSIS — T148XXA Other injury of unspecified body region, initial encounter: Secondary | ICD-10-CM

## 2012-07-14 DIAGNOSIS — S91009A Unspecified open wound, unspecified ankle, initial encounter: Secondary | ICD-10-CM | POA: Insufficient documentation

## 2012-07-14 DIAGNOSIS — R296 Repeated falls: Secondary | ICD-10-CM | POA: Insufficient documentation

## 2012-07-14 DIAGNOSIS — Z01812 Encounter for preprocedural laboratory examination: Secondary | ICD-10-CM | POA: Insufficient documentation

## 2012-07-14 DIAGNOSIS — Y835 Amputation of limb(s) as the cause of abnormal reaction of the patient, or of later complication, without mention of misadventure at the time of the procedure: Secondary | ICD-10-CM | POA: Insufficient documentation

## 2012-07-14 DIAGNOSIS — T85898A Other specified complication of other internal prosthetic devices, implants and grafts, initial encounter: Secondary | ICD-10-CM | POA: Insufficient documentation

## 2012-07-14 DIAGNOSIS — E785 Hyperlipidemia, unspecified: Secondary | ICD-10-CM | POA: Insufficient documentation

## 2012-07-14 DIAGNOSIS — T8789 Other complications of amputation stump: Secondary | ICD-10-CM | POA: Insufficient documentation

## 2012-07-14 DIAGNOSIS — F17200 Nicotine dependence, unspecified, uncomplicated: Secondary | ICD-10-CM | POA: Insufficient documentation

## 2012-07-14 DIAGNOSIS — S81009A Unspecified open wound, unspecified knee, initial encounter: Secondary | ICD-10-CM | POA: Insufficient documentation

## 2012-07-14 DIAGNOSIS — I10 Essential (primary) hypertension: Secondary | ICD-10-CM | POA: Insufficient documentation

## 2012-07-14 DIAGNOSIS — I517 Cardiomegaly: Secondary | ICD-10-CM | POA: Insufficient documentation

## 2012-07-14 DIAGNOSIS — L97809 Non-pressure chronic ulcer of other part of unspecified lower leg with unspecified severity: Secondary | ICD-10-CM | POA: Insufficient documentation

## 2012-07-14 DIAGNOSIS — Y832 Surgical operation with anastomosis, bypass or graft as the cause of abnormal reaction of the patient, or of later complication, without mention of misadventure at the time of the procedure: Secondary | ICD-10-CM | POA: Insufficient documentation

## 2012-07-14 DIAGNOSIS — Z79899 Other long term (current) drug therapy: Secondary | ICD-10-CM | POA: Insufficient documentation
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
## 2012-07-22 ENCOUNTER — Ambulatory Visit (INDEPENDENT_AMBULATORY_CARE_PROVIDER_SITE_OTHER): Payer: 59 | Admitting: Family Medicine

## 2012-07-22 ENCOUNTER — Encounter: Payer: Self-pay | Admitting: Family Medicine

## 2012-07-22 VITALS — BP 114/80 | HR 93

## 2012-07-22 DIAGNOSIS — D62 Acute posthemorrhagic anemia: Secondary | ICD-10-CM

## 2012-07-22 LAB — CBC WITH DIFFERENTIAL/PLATELET
Basophils Relative: 1 % (ref 0–1)
Eosinophils Absolute: 0.2 10*3/uL (ref 0.0–0.7)
Hemoglobin: 13.7 g/dL (ref 13.0–17.0)
MCH: 25.1 pg — ABNORMAL LOW (ref 26.0–34.0)
MCHC: 31.9 g/dL (ref 30.0–36.0)
Neutro Abs: 3.8 10*3/uL (ref 1.7–7.7)
Neutrophils Relative %: 55 % (ref 43–77)
Platelets: 327 10*3/uL (ref 150–400)
RBC: 5.46 MIL/uL (ref 4.22–5.81)

## 2012-07-22 NOTE — Progress Notes (Signed)
  Subjective:    Patient ID: Dean Bowers, male    DOB: [birth year], 42 y.o.   MRN: [id number]  HPI He is here for recheck on his anemia. He is doing quite well and having no difficulty with fatigue or shortness of breath.   Review of Systems     Objective:   Physical Exam Alert and in no distress.       Assessment & Plan:  Postoperative anemia due to acute blood loss - Plan: CBC with Differential

## 2012-07-22 NOTE — Progress Notes (Signed)
[facility]  NAME:  Dean Bowers, Dean Bowers              ACCOUNT NO.:  [id number]  MEDICAL RECORD NO.:  [id number]      DATE OF BIRTH:  Jun 10, 1970  PHYSICIAN:  Wayland Denis, DO       VISIT DATE:  07/21/2012                                  OFFICE VISIT   HISTORY OF PRESENT ILLNESS:  The patient is a 42 year old gentleman who is here for followup on his right lower extremity.  Amputation site with a failed flap after injury.  He is showing signs of some improvement since last visit, more debridement was done today still strongly suggest hyperbaric oxygen treatment, and we will see him back in followup.     Wayland Denis, DO     CS/MEDQ  D:  07/21/2012  T:  07/21/2012  Job:  [id number]

## 2012-07-24 NOTE — Progress Notes (Signed)
Quick Note:  Pt informed word for word  His hemoglobin is now in the normal range. Tell him he can stop the iron supplements but still a good idea to be on a multivitamin Pt verbalized understanding   ______

## 2012-08-05 ENCOUNTER — Encounter (HOSPITAL_BASED_OUTPATIENT_CLINIC_OR_DEPARTMENT_OTHER): Payer: 59

## 2012-08-14 ENCOUNTER — Other Ambulatory Visit: Payer: Self-pay

## 2012-08-14 ENCOUNTER — Institutional Professional Consult (permissible substitution): Payer: 59 | Admitting: Family Medicine

## 2012-08-14 ENCOUNTER — Encounter (HOSPITAL_BASED_OUTPATIENT_CLINIC_OR_DEPARTMENT_OTHER): Payer: Self-pay | Admitting: *Deleted

## 2012-08-14 DIAGNOSIS — S88911D Complete traumatic amputation of right lower leg, level unspecified, subsequent encounter: Secondary | ICD-10-CM
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
## 2012-08-15 ENCOUNTER — Other Ambulatory Visit: Payer: Self-pay | Admitting: Plastic Surgery

## 2012-08-15 DIAGNOSIS — L97912 Non-pressure chronic ulcer of unspecified part of right lower leg with fat layer exposed: Secondary | ICD-10-CM
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
## 2012-08-20 ENCOUNTER — Ambulatory Visit (HOSPITAL_BASED_OUTPATIENT_CLINIC_OR_DEPARTMENT_OTHER): Payer: 59 | Admitting: Anesthesiology

## 2012-08-20 ENCOUNTER — Encounter (HOSPITAL_BASED_OUTPATIENT_CLINIC_OR_DEPARTMENT_OTHER): Payer: Self-pay | Admitting: Anesthesiology

## 2012-08-20 ENCOUNTER — Ambulatory Visit (HOSPITAL_BASED_OUTPATIENT_CLINIC_OR_DEPARTMENT_OTHER)
Admission: RE | Admit: 2012-08-20 | Discharge: 2012-08-20 | Disposition: A | Discharge status: Home / Self Care | Payer: 59 | Source: Ambulatory Visit | Attending: Plastic Surgery | Admitting: Plastic Surgery

## 2012-08-20 ENCOUNTER — Encounter (HOSPITAL_BASED_OUTPATIENT_CLINIC_OR_DEPARTMENT_OTHER): Payer: Self-pay

## 2012-08-20 ENCOUNTER — Encounter (HOSPITAL_BASED_OUTPATIENT_CLINIC_OR_DEPARTMENT_OTHER)
Admission: RE | Disposition: A | Discharge status: Home / Self Care | Payer: Self-pay | Source: Ambulatory Visit | Attending: Plastic Surgery

## 2012-08-20 DIAGNOSIS — I10 Essential (primary) hypertension: Secondary | ICD-10-CM | POA: Insufficient documentation

## 2012-08-20 DIAGNOSIS — Y835 Amputation of limb(s) as the cause of abnormal reaction of the patient, or of later complication, without mention of misadventure at the time of the procedure: Secondary | ICD-10-CM | POA: Insufficient documentation

## 2012-08-20 DIAGNOSIS — L97912 Non-pressure chronic ulcer of unspecified part of right lower leg with fat layer exposed: Secondary | ICD-10-CM

## 2012-08-20 DIAGNOSIS — T8789 Other complications of amputation stump: Secondary | ICD-10-CM | POA: Insufficient documentation

## 2012-08-20 DIAGNOSIS — L988 Other specified disorders of the skin and subcutaneous tissue: Secondary | ICD-10-CM | POA: Insufficient documentation

## 2012-08-20 DIAGNOSIS — F17200 Nicotine dependence, unspecified, uncomplicated: Secondary | ICD-10-CM | POA: Insufficient documentation

## 2012-08-20 DIAGNOSIS — S88119A Complete traumatic amputation at level between knee and ankle, unspecified lower leg, initial encounter: Secondary | ICD-10-CM | POA: Insufficient documentation

## 2012-08-20 HISTORY — DX: Other chronic pain: G89.29

## 2012-08-20 HISTORY — PX: INCISION AND DRAINAGE OF WOUND: SHX1803

## 2012-08-20 HISTORY — DX: Necrosis of amputation stump, right lower extremity: T87.53

## 2012-08-20 HISTORY — DX: Dorsalgia, unspecified: M54.9

## 2012-08-20 HISTORY — DX: Phantom limb syndrome with pain: G54.6

## 2012-08-20 HISTORY — DX: Acquired absence of unspecified leg below knee: Z89.519

## 2012-08-20 HISTORY — DX: Elevated blood-pressure reading, without diagnosis of hypertension: R03.0

## 2012-08-20 LAB — POCT HEMOGLOBIN-HEMACUE: Hemoglobin: 14.9 g/dL (ref 13.0–17.0)

## 2012-08-20 LAB — RAPID URINE DRUG SCREEN, HOSP PERFORMED
Amphetamines: NOT DETECTED
Barbiturates: NOT DETECTED
Benzodiazepines: NOT DETECTED
Cocaine: NOT DETECTED
Opiates: NOT DETECTED
Tetrahydrocannabinol: POSITIVE — AB

## 2012-08-20 SURGERY — IRRIGATION AND DEBRIDEMENT WOUND
Anesthesia: General | Site: Leg Lower | Laterality: Right | Wound class: Contaminated

## 2012-08-20 MED ORDER — LACTATED RINGERS IV SOLN
INTRAVENOUS | Status: DC
  Filled 2012-08-20: qty 1000

## 2012-08-20 MED ORDER — LACTATED RINGERS IV SOLN
INTRAVENOUS | Status: DC
  Administered 2012-08-20: 08:00:00 via INTRAVENOUS
  Filled 2012-08-20: qty 1000

## 2012-08-20 MED ORDER — FENTANYL CITRATE 0.05 MG/ML IJ SOLN
INTRAMUSCULAR | Status: DC | PRN
  Administered 2012-08-20 (×2): 100 ug via INTRAVENOUS

## 2012-08-20 MED ORDER — PROMETHAZINE HCL 25 MG/ML IJ SOLN
6.2500 mg | INTRAMUSCULAR | Status: DC | PRN
  Filled 2012-08-20: qty 1

## 2012-08-20 MED ORDER — LIDOCAINE HCL (CARDIAC) 20 MG/ML IV SOLN
INTRAVENOUS | Status: DC | PRN
  Administered 2012-08-20: 100 mg via INTRAVENOUS

## 2012-08-20 MED ORDER — HYDROMORPHONE HCL PF 1 MG/ML IJ SOLN
0.2500 mg | INTRAMUSCULAR | Status: DC | PRN
  Filled 2012-08-20: qty 1

## 2012-08-20 MED ORDER — ACETAMINOPHEN 325 MG PO TABS
650.0000 mg | ORAL_TABLET | Freq: Four times a day (QID) | ORAL | Status: DC | PRN
  Administered 2012-08-20: 650 mg via ORAL
  Filled 2012-08-20: qty 2

## 2012-08-20 MED ORDER — SUCCINYLCHOLINE CHLORIDE 20 MG/ML IJ SOLN
INTRAMUSCULAR | Status: DC | PRN
  Administered 2012-08-20: 120 mg via INTRAVENOUS

## 2012-08-20 MED ORDER — OXYCODONE HCL 5 MG PO TABS
10.0000 mg | ORAL_TABLET | ORAL | Status: DC | PRN
  Administered 2012-08-20: 10 mg via ORAL
  Filled 2012-08-20: qty 2

## 2012-08-20 MED ORDER — SODIUM CHLORIDE 0.9 % IR SOLN
Status: DC | PRN
  Administered 2012-08-20: 09:00:00

## 2012-08-20 MED ORDER — ONDANSETRON HCL 4 MG/2ML IJ SOLN
INTRAMUSCULAR | Status: DC | PRN
  Administered 2012-08-20: 4 mg via INTRAVENOUS

## 2012-08-20 MED ORDER — PROPOFOL 10 MG/ML IV BOLUS
INTRAVENOUS | Status: DC | PRN
  Administered 2012-08-20: 50 mg via INTRAVENOUS
  Administered 2012-08-20: 300 mg via INTRAVENOUS
  Administered 2012-08-20: 50 mg via INTRAVENOUS

## 2012-08-20 MED ORDER — ROCURONIUM BROMIDE 100 MG/10ML IV SOLN
INTRAVENOUS | Status: DC | PRN
  Administered 2012-08-20: 2 mg via INTRAVENOUS

## 2012-08-20 MED ORDER — CEFAZOLIN SODIUM-DEXTROSE 2-3 GM-% IV SOLR
2.0000 g | INTRAVENOUS | Status: AC
  Administered 2012-08-20: 2 g via INTRAVENOUS
  Filled 2012-08-20: qty 50

## 2012-08-20 SURGICAL SUPPLY — 56 items
APL SKNCLS STERI-STRIP NONHPOA (GAUZE/BANDAGES/DRESSINGS)
BANDAGE ELASTIC 4 VELCRO ST LF (GAUZE/BANDAGES/DRESSINGS) ×1 IMPLANT
BANDAGE ELASTIC 6 VELCRO ST LF (GAUZE/BANDAGES/DRESSINGS) ×1 IMPLANT
BANDAGE GAUZE ELAST BULKY 4 IN (GAUZE/BANDAGES/DRESSINGS) ×1 IMPLANT
BENZOIN TINCTURE PRP APPL 2/3 (GAUZE/BANDAGES/DRESSINGS) IMPLANT
BLADE MINI RND TIP GREEN BEAV (BLADE) IMPLANT
BLADE SURG 10 STRL SS (BLADE) ×1 IMPLANT
BLADE SURG 15 STRL LF DISP TIS (BLADE) ×1 IMPLANT
BLADE SURG 15 STRL SS (BLADE) ×2
CANISTER OMNI JUG 16 LITER (MISCELLANEOUS) IMPLANT
CANISTER SUCTION 1200CC (MISCELLANEOUS) IMPLANT
CANISTER SUCTION 2500CC (MISCELLANEOUS) ×1 IMPLANT
CLOTH BEACON ORANGE TIMEOUT ST (SAFETY) ×2 IMPLANT
CORDS BIPOLAR (ELECTRODE) IMPLANT
COVER MAYO STAND STRL (DRAPES) ×2 IMPLANT
COVER TABLE BACK 60X90 (DRAPES) ×2 IMPLANT
DRAIN PENROSE 18X1/2 LTX STRL (DRAIN) ×2 IMPLANT
DRAPE INCISE IOBAN 66X45 STRL (DRAPES) ×1 IMPLANT
DRAPE LG THREE QUARTER DISP (DRAPES) ×1 IMPLANT
DRSG ADAPTIC 3X8 NADH LF (GAUZE/BANDAGES/DRESSINGS) ×1 IMPLANT
DRSG EMULSION OIL 3X3 NADH (GAUZE/BANDAGES/DRESSINGS) IMPLANT
DRSG PAD ABDOMINAL 8X10 ST (GAUZE/BANDAGES/DRESSINGS) IMPLANT
ELECT REM PT RETURN 9FT ADLT (ELECTROSURGICAL) ×2
ELECTRODE REM PT RTRN 9FT ADLT (ELECTROSURGICAL) ×1 IMPLANT
GAUZE SPONGE 4X4 12PLY STRL LF (GAUZE/BANDAGES/DRESSINGS) IMPLANT
GAUZE XEROFORM 1X8 LF (GAUZE/BANDAGES/DRESSINGS) IMPLANT
GAUZE XEROFORM 5X9 LF (GAUZE/BANDAGES/DRESSINGS) IMPLANT
GLOVE BIO SURGEON STRL SZ 6.5 (GLOVE) ×3 IMPLANT
GLOVE BIO SURGEON STRL SZ7 (GLOVE) ×1 IMPLANT
GLOVE ECLIPSE 6.0 STRL STRAW (GLOVE) ×1 IMPLANT
GOWN PREVENTION PLUS XLARGE (GOWN DISPOSABLE) ×4 IMPLANT
MATRIX SURGICAL PSM 10X15CM (Tissue) ×1 IMPLANT
MICROMATRIX 1000MG (Tissue) ×2 IMPLANT
NS IRRIG 1000ML POUR BTL (IV SOLUTION) ×2 IMPLANT
PACK BASIN DAY SURGERY FS (CUSTOM PROCEDURE TRAY) ×2 IMPLANT
PENCIL BUTTON HOLSTER BLD 10FT (ELECTRODE) ×1 IMPLANT
SOLUTION PARTIC MCRMTRX 1000MG (Tissue) IMPLANT
SPONGE GAUZE 4X4 12PLY (GAUZE/BANDAGES/DRESSINGS) ×2 IMPLANT
SPONGE LAP 18X18 X RAY DECT (DISPOSABLE) ×1 IMPLANT
SURGILUBE 2OZ TUBE FLIPTOP (MISCELLANEOUS) ×1 IMPLANT
SUT ETHILON 3 0 PS 1 (SUTURE) IMPLANT
SUT ETHILON 4 0 P 3 18 (SUTURE) IMPLANT
SUT ETHILON 5 0 PS 2 18 (SUTURE) IMPLANT
SUT PROLENE 3 0 PS 2 (SUTURE) IMPLANT
SUT SILK 3 0 PS 1 (SUTURE) IMPLANT
SUT VIC AB 3-0 FS2 27 (SUTURE) IMPLANT
SUT VIC AB 5-0 P-3 18X BRD (SUTURE) IMPLANT
SUT VIC AB 5-0 P3 18 (SUTURE)
SUT VIC AB 5-0 PS2 18 (SUTURE) ×3 IMPLANT
SYR BULB IRRIGATION 50ML (SYRINGE) ×1 IMPLANT
SYR CONTROL 10ML LL (SYRINGE) ×2 IMPLANT
TOWEL OR 17X24 6PK STRL BLUE (TOWEL DISPOSABLE) ×2 IMPLANT
TRAY DSU PREP LF (CUSTOM PROCEDURE TRAY) ×1 IMPLANT
TUBE CONNECTING 12X1/4 (SUCTIONS) IMPLANT
UNDERPAD 30X30 INCONTINENT (UNDERPADS AND DIAPERS) ×2 IMPLANT
YANKAUER SUCT BULB TIP NO VENT (SUCTIONS) ×1 IMPLANT
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
## 2012-08-20 NOTE — Brief Op Note (Signed)
08/20/2012  9:33 AM  PATIENT:  Dean Bowers  42 y.o. male  PRE-OPERATIVE DIAGNOSIS:  NECROSIS OF AMPUTATION STUMP RIGHT LOWER EXTREMITY   POST-OPERATIVE DIAGNOSIS:  NECROSIS OF AMPUTATION STUMP RIGHT LOWER EXTREMITY   PROCEDURE:  Procedure(s): RIGHT STUMP IRRIGATION AND DEBRIDEMENT BUNDLE WITH A CELL AND WOUND VAC  (Right)  SURGEON:  Surgeon(s) and Role:    * Claire Sanger, DO - Primary  PHYSICIAN ASSISTANT: none  ASSISTANTS: none   ANESTHESIA:   general  EBL:     BLOOD ADMINISTERED:none  DRAINS: none   LOCAL MEDICATIONS USED:  NONE  SPECIMEN:  No Specimen  DISPOSITION OF SPECIMEN:  N/A  COUNTS:  YES  TOURNIQUET:  * No tourniquets in log *  DICTATION: .Dragon Dictation  PLAN OF CARE: Discharge to home after PACU  PATIENT DISPOSITION:  PACU - hemodynamically stable.   Delay start of Pharmacological VTE agent (>24hrs) due to surgical blood loss or risk of bleeding: no

## 2012-08-20 NOTE — Anesthesia Postprocedure Evaluation (Signed)
Anesthesia Post Note  Patient: Dean Bowers  Procedure(s) Performed: Procedure(s) (LRB): RIGHT STUMP IRRIGATION AND DEBRIDEMENT BUNDLE WITH A CELL AND WOUND VAC  (Right)  Anesthesia type: General  Patient location: PACU  Post pain: Pain level controlled  Post assessment: Post-op Vital signs reviewed  Last Vitals:  Filed Vitals:   08/20/12 1030  BP: 112/58  Pulse: 102  Temp:   Resp: 21    Post vital signs: Reviewed  Level of consciousness: sedated  Complications: No apparent anesthesia complications

## 2012-08-20 NOTE — OR Nursing (Signed)
Patient6 arrived to OR with woumd vac. Removed per Dr. Kelly Splinter prior to prep. Pat Kocher RN

## 2012-08-20 NOTE — Anesthesia Preprocedure Evaluation (Addendum)
Anesthesia Evaluation  Patient identified by MRN, date of birth, ID band Patient awake    Reviewed: Allergy & Precautions  History of Anesthesia Complications Negative for: history of anesthetic complications  Airway Mallampati: II  Neck ROM: Full    Dental  (+) Teeth Intact and Dental Advisory Given   Pulmonary asthma , Current Smoker,    Pulmonary exam normal       Cardiovascular hypertension, Pt. on medications     Neuro/Psych  Neuromuscular disease negative neurological ROS  negative psych ROS   GI/Hepatic negative GI ROS, (+)     substance abuse  alcohol use and methamphetamine use,   Endo/Other  Morbid obesity  Renal/GU negative Renal ROS     Musculoskeletal   Abdominal (+) + obese,   Peds  Hematology negative hematology ROS (+)   Anesthesia Other Findings Pt has C6 fx with cervical collar in place. Patient no longer wearing cervical on presentation today. Patient noted to be drinking water on arrival to preadmission day of surgery. Will delay surgery for two hours from last po intake. Patient denies C6 fracture first noted at time of initial injury.  Reproductive/Obstetrics                         Anesthesia Physical Anesthesia Plan  ASA: III  Anesthesia Plan: General   Post-op Pain Management:    Induction: Intravenous  Airway Management Planned: Oral ETT  Additional Equipment:   Intra-op Plan:   Post-operative Plan: Extubation in OR  Informed Consent: I have reviewed the patients History and Physical, chart, labs and discussed the procedure including the risks, benefits and alternatives for the proposed anesthesia with the patient or authorized representative who has indicated his/her understanding and acceptance.   Dental advisory given  Plan Discussed with: CRNA  Anesthesia Plan Comments:        Anesthesia Quick Evaluation

## 2012-08-20 NOTE — Transfer of Care (Signed)
Immediate Anesthesia Transfer of Care Note  Patient: Dean Bowers  Procedure(s) Performed: Procedure(s): RIGHT STUMP IRRIGATION AND DEBRIDEMENT BUNDLE WITH A CELL AND WOUND VAC  (Right)  Patient Location: PACU  Anesthesia Type:General  Level of Consciousness: awake, alert  and oriented  Airway & Oxygen Therapy: Patient Spontanous Breathing and Patient connected to face mask oxygen  Post-op Assessment: Report given to PACU RN and Post -op Vital signs reviewed and stable  Post vital signs: Reviewed and stable  Complications: No apparent anesthesia complications

## 2012-08-20 NOTE — Interval H&P Note (Signed)
History and Physical Interval Note:  08/20/2012 8:34 AM  Dean Bowers  has presented today for surgery, with the diagnosis of NECROSIS OF AMPUTATION STUMP RIGHT LOWER EXTREMITY   The various methods of treatment have been discussed with the patient and family. After consideration of risks, benefits and other options for treatment, the patient has consented to  Procedure(s): RIGHT STUMP IRRIGATION AND DEBRIDEMENT BUNDLE WITH A CELL AND WOUND VAC  (Right) as a surgical intervention .  The patient's history has been reviewed, patient examined, no change in status, stable for surgery.  I have reviewed the patient's chart and labs.  Questions were answered to the patient's satisfaction.     SANGER,Hamlet Lasecki

## 2012-08-21 ENCOUNTER — Encounter (HOSPITAL_BASED_OUTPATIENT_CLINIC_OR_DEPARTMENT_OTHER): Payer: Self-pay | Admitting: Plastic Surgery

## 2012-08-21 ENCOUNTER — Telehealth: Payer: Self-pay | Admitting: Family Medicine

## 2012-08-21 MED ORDER — ALPRAZOLAM 0.5 MG PO TABS: 0.5000 mg | ORAL_TABLET | Freq: Three times a day (TID) | ORAL | Status: DC | PRN
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
## 2012-08-21 NOTE — Telephone Encounter (Signed)
Pt's wife called and stated that he was given a rx for xanax while he was in the hospital. He is now running out. He has about 7 or 8 left she states. Pt is requesting a refill sent to his pharmacy which is [facility] on [address].

## 2012-08-21 NOTE — Telephone Encounter (Signed)
CALLED XANAX IN 

## 2012-08-21 NOTE — Telephone Encounter (Signed)
Okay to renew
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
## 2012-09-11 ENCOUNTER — Ambulatory Visit (INDEPENDENT_AMBULATORY_CARE_PROVIDER_SITE_OTHER): Payer: 59 | Admitting: Family Medicine

## 2012-09-11 ENCOUNTER — Encounter: Payer: Self-pay | Admitting: Family Medicine

## 2012-09-11 VITALS — BP 130/86 | HR 112

## 2012-09-11 DIAGNOSIS — R319 Hematuria, unspecified: Secondary | ICD-10-CM

## 2012-09-11 DIAGNOSIS — N390 Urinary tract infection, site not specified: Secondary | ICD-10-CM

## 2012-09-11 LAB — POCT URINALYSIS DIPSTICK
Bilirubin, UA: NEGATIVE
Blood, UA: 250
Glucose, UA: NEGATIVE
Nitrite, UA: NEGATIVE

## 2012-09-11 MED ORDER — SULFAMETHOXAZOLE-TMP DS 800-160 MG PO TABS: 1.0000 | ORAL_TABLET | Freq: Two times a day (BID) | ORAL | Status: DC

## 2012-09-11 NOTE — Progress Notes (Signed)
  Subjective:    Patient ID: Dean Bowers, male    DOB: [birth year], 42 y.o.   MRN: [id number]  HPI Approximately 4 days ago he noted brownish urine but no fever chills some slight backache. Last night he noted the onset of urgency and frequency. He was hospitalized April 20 through May 9 and did have a catheter placed. He did have some post catheterization urinary difficulty but responded to Flomax   Review of Systems     Objective:   Physical Exam Alert and in no distress. Lower abdominal exam shows no tenderness. Rectal not done. He microscopic showed RBCs:TNTC      Assessment & Plan:  Blood in urine - Plan: POCT Urinalysis Dipstick  UTI (lower urinary tract infection) - Plan: sulfamethoxazole-trimethoprim (BACTRIM DS) 800-160 MG per tablet, CULTURE, URINE COMPREHENSIVE  he will return here in 2 weeks for recheck on his urine.

## 2012-09-15 LAB — CULTURE, URINE COMPREHENSIVE

## 2012-09-15 MED ORDER — AMOXICILLIN 875 MG PO TABS: 875.0000 mg | ORAL_TABLET | Freq: Two times a day (BID) | ORAL | Status: DC

## 2012-09-15 NOTE — Progress Notes (Signed)
  Subjective:    Patient ID: Dean Bowers, male    DOB: August 08, 1970, 42 y.o.   MRN: [id number]  HPI    Review of Systems     Objective:   Physical Exam        Assessment & Plan:  Switched to amoxil due to culture results

## 2012-09-15 NOTE — Progress Notes (Signed)
Quick Note:  Left message on pt cell# word for word Tell him that I called in a different med for his urine results. Then recheck with me in 2 weeks ______

## 2012-09-15 NOTE — Addendum Note (Signed)
Addended by: Ronnald Nian on: 09/15/2012 11:34 AM   Modules accepted: Orders

## 2012-09-25 ENCOUNTER — Ambulatory Visit (INDEPENDENT_AMBULATORY_CARE_PROVIDER_SITE_OTHER): Payer: 59 | Admitting: Family Medicine

## 2012-09-25 DIAGNOSIS — F411 Generalized anxiety disorder: Secondary | ICD-10-CM

## 2012-09-25 DIAGNOSIS — R319 Hematuria, unspecified: Secondary | ICD-10-CM

## 2012-09-25 DIAGNOSIS — R062 Wheezing: Secondary | ICD-10-CM

## 2012-09-25 LAB — POCT URINALYSIS DIPSTICK
Blood, UA: NEGATIVE
Glucose, UA: NEGATIVE
Nitrite, UA: NEGATIVE
Protein, UA: NEGATIVE
Spec Grav, UA: 1.025
Urobilinogen, UA: NEGATIVE
pH, UA: 5

## 2012-09-25 MED ORDER — ALBUTEROL SULFATE HFA 108 (90 BASE) MCG/ACT IN AERS: 2.0000 | INHALATION_SPRAY | Freq: Four times a day (QID) | RESPIRATORY_TRACT | Status: DC | PRN

## 2012-09-25 MED ORDER — ALPRAZOLAM 0.5 MG PO TABS: 0.5000 mg | ORAL_TABLET | Freq: Three times a day (TID) | ORAL | Status: DC | PRN

## 2012-09-25 NOTE — Progress Notes (Signed)
  Subjective:    Patient ID: Dean Bowers, male    DOB: 1970/03/31, 42 y.o.   MRN: [id number]  HPI He is here for recheck. He states it is after a day or 2 of being on the antibiotic his frequency and urgency that much better. He has not seen any more blood in his urine. He would like a refill on albuterol which he uses very sparingly. He would also like a refill on his Xanax. Review of record indicates he is using approximately one per day.   Review of Systems     Objective:   Physical Exam Alert and in no distress. Urine dipstick was negative.       Assessment & Plan:  Blood in urine - Plan: POCT Urinalysis Dipstick  Wheezing  Anxiety state, unspecified  I will renew his albuterol and: Xanax. Discussed the need for him to use the Xanax very sparingly.

## 2012-10-27 DIAGNOSIS — Z0279 Encounter for issue of other medical certificate: Secondary | ICD-10-CM

## 2012-10-30 ENCOUNTER — Telehealth: Payer: Self-pay | Admitting: Internal Medicine

## 2012-10-30 ENCOUNTER — Ambulatory Visit: Payer: 59 | Attending: Orthopedic Surgery | Admitting: Physical Therapy

## 2012-10-30 DIAGNOSIS — S88119A Complete traumatic amputation at level between knee and ankle, unspecified lower leg, initial encounter: Secondary | ICD-10-CM | POA: Insufficient documentation

## 2012-10-30 DIAGNOSIS — R269 Unspecified abnormalities of gait and mobility: Secondary | ICD-10-CM | POA: Insufficient documentation

## 2012-10-30 DIAGNOSIS — Z5189 Encounter for other specified aftercare: Secondary | ICD-10-CM | POA: Insufficient documentation

## 2012-10-30 DIAGNOSIS — R5381 Other malaise: Secondary | ICD-10-CM | POA: Insufficient documentation

## 2012-10-30 DIAGNOSIS — Z9181 History of falling: Secondary | ICD-10-CM | POA: Insufficient documentation

## 2012-10-30 NOTE — Telephone Encounter (Signed)
Faxed over medical records to [employer] @ [phone] 

## 2012-11-06 ENCOUNTER — Other Ambulatory Visit: Payer: Self-pay

## 2012-11-06 ENCOUNTER — Telehealth: Payer: Self-pay | Admitting: Family Medicine

## 2012-11-06 MED ORDER — GABAPENTIN 600 MG PO TABS: 600.0000 mg | ORAL_TABLET | Freq: Three times a day (TID) | ORAL | Status: DC

## 2012-11-06 MED ORDER — GABAPENTIN 300 MG PO CAPS: 300.0000 mg | ORAL_CAPSULE | Freq: Three times a day (TID) | ORAL | Status: DC

## 2012-11-06 NOTE — Telephone Encounter (Signed)
DR.LALONDE IS THIS OK PLEASE ADVISE

## 2012-11-06 NOTE — Telephone Encounter (Signed)
IS THIS OK 

## 2012-11-06 NOTE — Telephone Encounter (Signed)
Pt's wife called and is requesting a refill on his Gabapentin 600mg  taken TID.  She requested a refill from his original prescriber Dr. Myrene Galas with [facility], and they advised him he would need to be on this permantley and to have if his PCP  take over his refills with this medication.  If this is ok to refill... [facility] [address]

## 2012-12-11 ENCOUNTER — Other Ambulatory Visit: Payer: Self-pay

## 2012-12-26 ENCOUNTER — Telehealth: Payer: Self-pay | Admitting: Internal Medicine

## 2012-12-26 NOTE — Telephone Encounter (Signed)
Faxed over medical records to [facility] @ [phone] on 12/19/12

## 2013-01-11 ENCOUNTER — Other Ambulatory Visit: Payer: Self-pay | Admitting: Family Medicine

## 2013-01-13 DIAGNOSIS — Z0289 Encounter for other administrative examinations: Secondary | ICD-10-CM

## 2013-03-04 ENCOUNTER — Other Ambulatory Visit: Payer: Self-pay | Admitting: Family Medicine

## 2013-03-06 ENCOUNTER — Encounter: Payer: Self-pay | Admitting: Medical

## 2013-03-06 ENCOUNTER — Ambulatory Visit (INDEPENDENT_AMBULATORY_CARE_PROVIDER_SITE_OTHER): Payer: Managed Care, Other (non HMO) | Admitting: Medical

## 2013-03-06 VITALS — BP 122/82 | HR 110 | Temp 98.1°F | Resp 18

## 2013-03-06 DIAGNOSIS — R062 Wheezing: Secondary | ICD-10-CM

## 2013-03-06 DIAGNOSIS — J209 Acute bronchitis, unspecified: Secondary | ICD-10-CM

## 2013-03-06 DIAGNOSIS — F17200 Nicotine dependence, unspecified, uncomplicated: Secondary | ICD-10-CM

## 2013-03-06 DIAGNOSIS — H6690 Otitis media, unspecified, unspecified ear: Secondary | ICD-10-CM

## 2013-03-06 MED ORDER — AMOXICILLIN-POT CLAVULANATE 875-125 MG PO TABS: 1.0000 | ORAL_TABLET | Freq: Two times a day (BID) | ORAL | Status: DC

## 2013-03-06 MED ORDER — ALBUTEROL SULFATE HFA 108 (90 BASE) MCG/ACT IN AERS: 2.0000 | INHALATION_SPRAY | Freq: Four times a day (QID) | RESPIRATORY_TRACT | Status: DC | PRN

## 2013-03-06 MED ORDER — PREDNISONE 20 MG PO TABS: ORAL_TABLET | ORAL | Status: DC
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
## 2013-03-24 ENCOUNTER — Ambulatory Visit: Payer: No Typology Code available for payment source | Admitting: Physical Therapy

## 2013-03-26 ENCOUNTER — Ambulatory Visit: Payer: PRIVATE HEALTH INSURANCE | Attending: Orthopedic Surgery | Admitting: Physical Therapy

## 2013-03-26 DIAGNOSIS — R5381 Other malaise: Secondary | ICD-10-CM | POA: Insufficient documentation

## 2013-03-26 DIAGNOSIS — R269 Unspecified abnormalities of gait and mobility: Secondary | ICD-10-CM | POA: Insufficient documentation

## 2013-03-26 DIAGNOSIS — Z4789 Encounter for other orthopedic aftercare: Secondary | ICD-10-CM | POA: Insufficient documentation

## 2013-03-30 ENCOUNTER — Other Ambulatory Visit: Payer: Self-pay | Admitting: Family Medicine

## 2013-03-31 NOTE — Telephone Encounter (Signed)
Okay to renew

## 2013-03-31 NOTE — Telephone Encounter (Signed)
Is this okay to call in? 

## 2013-03-31 NOTE — Telephone Encounter (Signed)
Called med out to pharmacy  

## 2013-04-01 ENCOUNTER — Ambulatory Visit: Payer: PRIVATE HEALTH INSURANCE | Admitting: Physical Therapy

## 2013-04-07 ENCOUNTER — Ambulatory Visit: Payer: Managed Care, Other (non HMO) | Attending: Orthopedic Surgery | Admitting: Physical Therapy

## 2013-04-07 DIAGNOSIS — R269 Unspecified abnormalities of gait and mobility: Secondary | ICD-10-CM | POA: Insufficient documentation

## 2013-04-07 DIAGNOSIS — R5381 Other malaise: Secondary | ICD-10-CM | POA: Insufficient documentation

## 2013-04-07 DIAGNOSIS — IMO0001 Reserved for inherently not codable concepts without codable children: Secondary | ICD-10-CM | POA: Insufficient documentation

## 2013-04-09 ENCOUNTER — Ambulatory Visit: Payer: Managed Care, Other (non HMO) | Admitting: Physical Therapy

## 2013-04-14 ENCOUNTER — Ambulatory Visit: Payer: Managed Care, Other (non HMO) | Admitting: Physical Therapy

## 2013-04-16 ENCOUNTER — Ambulatory Visit: Payer: Managed Care, Other (non HMO)

## 2013-04-21 ENCOUNTER — Ambulatory Visit: Payer: Managed Care, Other (non HMO) | Admitting: Physical Therapy

## 2013-04-23 ENCOUNTER — Ambulatory Visit: Payer: Managed Care, Other (non HMO) | Admitting: Physical Therapy

## 2013-04-28 ENCOUNTER — Ambulatory Visit: Payer: Managed Care, Other (non HMO) | Admitting: Physical Therapy

## 2013-04-30 ENCOUNTER — Ambulatory Visit: Payer: Managed Care, Other (non HMO) | Admitting: Physical Therapy

## 2013-05-05 ENCOUNTER — Ambulatory Visit: Payer: Managed Care, Other (non HMO) | Admitting: Physical Therapy

## 2013-05-07 ENCOUNTER — Ambulatory Visit: Payer: Managed Care, Other (non HMO) | Attending: Orthopedic Surgery | Admitting: Physical Therapy

## 2013-05-07 DIAGNOSIS — R269 Unspecified abnormalities of gait and mobility: Secondary | ICD-10-CM | POA: Insufficient documentation

## 2013-05-07 DIAGNOSIS — IMO0001 Reserved for inherently not codable concepts without codable children: Secondary | ICD-10-CM | POA: Insufficient documentation

## 2013-05-07 DIAGNOSIS — R5381 Other malaise: Secondary | ICD-10-CM | POA: Insufficient documentation

## 2013-05-12 ENCOUNTER — Other Ambulatory Visit: Payer: Self-pay | Admitting: Medical

## 2013-05-12 ENCOUNTER — Encounter: Payer: Self-pay | Admitting: Medical

## 2013-05-12 ENCOUNTER — Ambulatory Visit (INDEPENDENT_AMBULATORY_CARE_PROVIDER_SITE_OTHER): Payer: Managed Care, Other (non HMO) | Admitting: Medical

## 2013-05-12 ENCOUNTER — Ambulatory Visit: Payer: Managed Care, Other (non HMO) | Admitting: Physical Therapy

## 2013-05-12 VITALS — BP 142/98 | HR 96 | Temp 98.1°F | Resp 16 | Wt 382.0 lb

## 2013-05-12 DIAGNOSIS — R51 Headache: Secondary | ICD-10-CM

## 2013-05-12 DIAGNOSIS — F17200 Nicotine dependence, unspecified, uncomplicated: Secondary | ICD-10-CM

## 2013-05-12 DIAGNOSIS — R0602 Shortness of breath: Secondary | ICD-10-CM

## 2013-05-12 DIAGNOSIS — Z89519 Acquired absence of unspecified leg below knee: Secondary | ICD-10-CM

## 2013-05-12 DIAGNOSIS — I10 Essential (primary) hypertension: Secondary | ICD-10-CM

## 2013-05-12 DIAGNOSIS — E785 Hyperlipidemia, unspecified: Secondary | ICD-10-CM

## 2013-05-12 DIAGNOSIS — M79605 Pain in left leg: Secondary | ICD-10-CM

## 2013-05-12 DIAGNOSIS — S88119A Complete traumatic amputation at level between knee and ankle, unspecified lower leg, initial encounter: Secondary | ICD-10-CM

## 2013-05-12 DIAGNOSIS — R609 Edema, unspecified: Secondary | ICD-10-CM

## 2013-05-12 DIAGNOSIS — M7989 Other specified soft tissue disorders: Secondary | ICD-10-CM

## 2013-05-12 MED ORDER — LISINOPRIL-HYDROCHLOROTHIAZIDE 20-12.5 MG PO TABS: 1.0000 | ORAL_TABLET | Freq: Every day | ORAL | Status: DC
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
## 2013-05-13 ENCOUNTER — Telehealth: Payer: Self-pay | Admitting: Family Medicine

## 2013-05-13 ENCOUNTER — Ambulatory Visit
Admission: RE | Admit: 2013-05-13 | Discharge: 2013-05-13 | Disposition: A | Discharge status: Home / Self Care | Payer: 59 | Source: Ambulatory Visit | Attending: Medical | Admitting: Medical

## 2013-05-13 ENCOUNTER — Other Ambulatory Visit: Payer: Managed Care, Other (non HMO)

## 2013-05-13 DIAGNOSIS — M7989 Other specified soft tissue disorders: Secondary | ICD-10-CM

## 2013-05-13 NOTE — Telephone Encounter (Signed)
PATIENT IS AWARE OF HIS APPOINTMENT TO HAVE PFT'S DONE ON Jun 10, 2013 @ [place]. CLS [phone]

## 2013-05-14 ENCOUNTER — Encounter: Payer: Managed Care, Other (non HMO) | Admitting: Physical Therapy

## 2013-05-14 ENCOUNTER — Other Ambulatory Visit: Payer: Self-pay | Admitting: Medical

## 2013-05-14 ENCOUNTER — Telehealth: Payer: Self-pay

## 2013-05-14 DIAGNOSIS — R0602 Shortness of breath: Secondary | ICD-10-CM

## 2013-05-14 DIAGNOSIS — I82409 Acute embolism and thrombosis of unspecified deep veins of unspecified lower extremity: Secondary | ICD-10-CM

## 2013-05-14 DIAGNOSIS — R062 Wheezing: Secondary | ICD-10-CM

## 2013-05-14 MED ORDER — RIVAROXABAN (XARELTO) VTE STARTER PACK (15 & 20 MG): ORAL_TABLET | ORAL | Status: DC

## 2013-05-14 NOTE — Progress Notes (Signed)
CT scheduled for 05/15/13 3:40 [facility]

## 2013-05-14 NOTE — Progress Notes (Signed)
Advised wife of patients need of CT Chest, due to blood clot noted yesterday and need to start new Rx per Dorothea Ogle. CT chest scheduled for tomorrow at 3:40 [place]. Shane aware. [facility]

## 2013-05-14 NOTE — Telephone Encounter (Signed)
Message copied by Louie Bun on Thu May 14, 2013  9:11 AM ------      Message from: Carlena Hurl      Created: Thu May 14, 2013  7:58 AM       Let him know that they apparently have a hard time doing his ultrasound, but there was blood clot in his leg, but it doesn't appear to be blocking anything. Given this information, I would like to do 2 followup things:  In addition to getting a pulmonary function studies which I believe Andria Frames already ordered, I want to send him for chest CT to rule out blood clot in the lungs, and I would like to start him on Xarelto which is a medicine to help prevent blood clots or help a blood clot resolve. The biggest adverse effects of this medication could be bleeding or allergic reaction, but I think given his current situation it would be safer to do this than not.  I sent the medication to the pharmacy, go ahead and get him in for chest CT ------

## 2013-05-14 NOTE — Telephone Encounter (Signed)
Notified wife of patients blood clot, need for CT chest and need to start new Rx per [facility]. CT chest scheduled for tomorrow at 3:40pm [facility] [address] Audelia Acton Tysinger aware. [facility]

## 2013-05-15 ENCOUNTER — Telehealth: Payer: Self-pay | Admitting: *Deleted

## 2013-05-15 ENCOUNTER — Ambulatory Visit
Admission: RE | Admit: 2013-05-15 | Discharge: 2013-05-15 | Disposition: A | Discharge status: Home / Self Care | Payer: 59 | Source: Ambulatory Visit | Attending: Medical | Admitting: Medical

## 2013-05-15 DIAGNOSIS — I82409 Acute embolism and thrombosis of unspecified deep veins of unspecified lower extremity: Secondary | ICD-10-CM

## 2013-05-15 DIAGNOSIS — R062 Wheezing: Secondary | ICD-10-CM

## 2013-05-15 DIAGNOSIS — R0602 Shortness of breath: Secondary | ICD-10-CM

## 2013-05-15 MED ORDER — IOHEXOL 350 MG/ML SOLN
150.0000 mL | Freq: Once | INTRAVENOUS | Status: AC | PRN
  Administered 2013-05-15: 150 mL via INTRAVENOUS

## 2013-05-15 NOTE — Telephone Encounter (Signed)
Patient's wife called to find out if we had a result yet on his imaging. I looked and there wasn't anything yet. Please look out for it and call Constance Holster, his wife @ [phone]. Thanks.

## 2013-05-18 ENCOUNTER — Telehealth: Payer: Self-pay | Admitting: Family Medicine

## 2013-05-18 NOTE — Telephone Encounter (Signed)
Audelia Acton, patient needs a letter from you so he can return to physical therapy at [facility] [address] [place], [place] [address]. CLS  [phone]

## 2013-05-19 ENCOUNTER — Other Ambulatory Visit: Payer: Self-pay | Admitting: Medical

## 2013-05-19 ENCOUNTER — Ambulatory Visit: Payer: Managed Care, Other (non HMO) | Admitting: Physical Therapy

## 2013-05-19 ENCOUNTER — Telehealth: Payer: Self-pay | Admitting: Medical

## 2013-05-19 ENCOUNTER — Encounter: Payer: Self-pay | Admitting: Medical

## 2013-05-19 NOTE — Telephone Encounter (Signed)
Send letter

## 2013-05-19 NOTE — Telephone Encounter (Signed)
I FAX THE LETTER OVER TO PT. CLS

## 2013-05-19 NOTE — Telephone Encounter (Signed)
Received call from pt's wife, they are at facility to pick up compression hose but Audelia Acton did not specify  Knee high or thigh high. Gabriel Cirri called Audelia Acton and per Audelia Acton patient need knee high compression hose

## 2013-05-21 ENCOUNTER — Ambulatory Visit: Payer: Managed Care, Other (non HMO) | Admitting: Physical Therapy

## 2013-05-26 ENCOUNTER — Ambulatory Visit: Payer: Managed Care, Other (non HMO) | Admitting: Physical Therapy

## 2013-05-27 ENCOUNTER — Encounter: Payer: Self-pay | Admitting: Family Medicine

## 2013-05-27 ENCOUNTER — Ambulatory Visit (INDEPENDENT_AMBULATORY_CARE_PROVIDER_SITE_OTHER): Payer: Managed Care, Other (non HMO) | Admitting: Family Medicine

## 2013-05-27 VITALS — BP 110/70 | HR 84 | Wt 350.0 lb

## 2013-05-27 DIAGNOSIS — S98919A Complete traumatic amputation of unspecified foot, level unspecified, initial encounter: Secondary | ICD-10-CM

## 2013-05-27 DIAGNOSIS — R195 Other fecal abnormalities: Secondary | ICD-10-CM

## 2013-05-27 DIAGNOSIS — F17200 Nicotine dependence, unspecified, uncomplicated: Secondary | ICD-10-CM

## 2013-05-27 DIAGNOSIS — O2230 Deep phlebothrombosis in pregnancy, unspecified trimester: Secondary | ICD-10-CM

## 2013-05-27 DIAGNOSIS — Z0000 Encounter for general adult medical examination without abnormal findings: Secondary | ICD-10-CM

## 2013-05-27 DIAGNOSIS — I8010 Phlebitis and thrombophlebitis of unspecified femoral vein: Secondary | ICD-10-CM

## 2013-05-27 DIAGNOSIS — J45909 Unspecified asthma, uncomplicated: Secondary | ICD-10-CM

## 2013-05-27 DIAGNOSIS — E7800 Pure hypercholesterolemia, unspecified: Secondary | ICD-10-CM

## 2013-05-27 DIAGNOSIS — S98911A Complete traumatic amputation of right foot, level unspecified, initial encounter: Secondary | ICD-10-CM

## 2013-05-27 LAB — HEMOCCULT GUIAC POC 1CARD (OFFICE)

## 2013-05-27 LAB — CBC WITH DIFFERENTIAL/PLATELET
BASOS ABS: 0 10*3/uL (ref 0.0–0.1)
Basophils Relative: 0 % (ref 0–1)
EOS ABS: 0.2 10*3/uL (ref 0.0–0.7)
EOS PCT: 2 % (ref 0–5)
HCT: 46.3 % (ref 39.0–52.0)
Hemoglobin: 15.1 g/dL (ref 13.0–17.0)
LYMPHS ABS: 2 10*3/uL (ref 0.7–4.0)
Lymphocytes Relative: 20 % (ref 12–46)
MCH: 26 pg (ref 26.0–34.0)
MCHC: 32.6 g/dL (ref 30.0–36.0)
MCV: 79.8 fL (ref 78.0–100.0)
Monocytes Absolute: 0.7 10*3/uL (ref 0.1–1.0)
Monocytes Relative: 7 % (ref 3–12)
NEUTROS PCT: 71 % (ref 43–77)
Neutro Abs: 7.1 10*3/uL (ref 1.7–7.7)
Platelets: 297 10*3/uL (ref 150–400)
RBC: 5.8 MIL/uL (ref 4.22–5.81)
RDW: 14.9 % (ref 11.5–15.5)
WBC: 10 10*3/uL (ref 4.0–10.5)

## 2013-05-27 LAB — COMPREHENSIVE METABOLIC PANEL
ALK PHOS: 87 U/L (ref 39–117)
ALT: 22 U/L (ref 0–53)
AST: 16 U/L (ref 0–37)
Albumin: 3.8 g/dL (ref 3.5–5.2)
BILIRUBIN TOTAL: 0.3 mg/dL (ref 0.2–1.2)
BUN: 14 mg/dL (ref 6–23)
CO2: 30 mEq/L (ref 19–32)
CREATININE: 0.61 mg/dL (ref 0.50–1.35)
Calcium: 9.1 mg/dL (ref 8.4–10.5)
Chloride: 95 mEq/L — ABNORMAL LOW (ref 96–112)
Glucose, Bld: 94 mg/dL (ref 70–99)
Potassium: 4.3 mEq/L (ref 3.5–5.3)
SODIUM: 134 meq/L — AB (ref 135–145)
TOTAL PROTEIN: 7.3 g/dL (ref 6.0–8.3)

## 2013-05-27 LAB — LIPID PANEL
CHOL/HDL RATIO: 2.9 ratio
Cholesterol: 149 mg/dL (ref 0–200)
HDL: 52 mg/dL (ref >39)
LDL CALC: 82 mg/dL (ref 0–99)
Triglycerides: 77 mg/dL (ref <150)
VLDL: 15 mg/dL (ref 0–40)

## 2013-05-27 MED ORDER — FLUTICASONE FUROATE-VILANTEROL 100-25 MCG/INH IN AEPB: 1.0000 | INHALATION_SPRAY | Freq: Two times a day (BID) | RESPIRATORY_TRACT | Status: DC

## 2013-05-27 NOTE — Patient Instructions (Signed)
Premedicate yourself with something like Advil or Aleve to help when you do your physical

## 2013-05-27 NOTE — Progress Notes (Signed)
Subjective:    Patient ID: Dean Bowers, male    DOB: [birth year], 43 y.o.   MRN: [id number]  HPI He is here for a visit. He is now using a prosthesis he has had this for 5 weeks. He is in physical therapy for this. He is having some difficulty with getting used to this and also mobility in general. He notes difficulty with pain and spasm especially when he gets up and walks. He does plan to try and get a walker that he consider on and continue with his physical activities. He continues to smoke but states loss of factors that interfere with him quitting smoking. He notes difficulty with weight gain since he has been in the wheelchair and realizes this is dangerous. Presently he is taking Neurontin 600 mg twice a day and is doing well on it. He states that the inhaler is working and now he is using the albuterol much less often. He continues on XARELTO. He still is having some slight difficulty with erectile dysfunction. His wife also states that since his weight gain he is been having difficulty with snoring and he does find that he falls asleep quite easily during the day.  Review of Systems  All other systems reviewed and are negative.      Objective:   Physical Exam BP 110/70  Pulse 84  Wt 350 lb (158.759 kg)  General Appearance:    Alert, cooperative, no distress, appears stated age  Head:    Normocephalic, without obvious abnormality, atraumatic  Eyes:    PERRL, conjunctiva/corneas clear, EOM's intact, fundi    benign  Ears:    Normal TM's and external ear canals  Nose:   Nares normal, mucosa normal, no drainage or sinus   tenderness  Throat:   Lips, mucosa, and tongue normal; teeth and gum.s normal  Neck:   Supple, no lymphadenopathy;  thyroid:  no   enlargement/tenderness/nodules; no carotid   bruit or JVD  Back:    Spine nontender, no curvature, ROM normal, no CVA     tenderness  Lungs:     Clear to auscultation bilaterally without wheezes, rales or     ronchi; respirations  unlabored  Chest Wall:    No tenderness or deformity   Heart:    Regular rate and rhythm, S1 and S2 normal, no murmur, rub   or gallop  Breast Exam:    No chest wall tenderness, masses or gynecomastia  Abdomen:     Soft, non-tender, nondistended, normoactive bowel sounds,    no masses, no hepatosplenomegaly  Genitalia:    Normal male external genitalia without lesions.  Testicles without masses.  No inguinal hernias.  Rectal:    Normal sphincter tone, no masses or tenderness; guaiac positive stool.  Prostate smooth, no nodules, not enlarged.  Extremities:   No clubbing, cyanosis or edema  Pulses:   2+ and symmetric all extremities  Skin:   Skin color, texture, turgor normal, no rashes or lesions  Lymph nodes:   Cervical, supraclavicular, and axillary nodes normal  Neurologic:   CNII-XII intact, normal strength, sensation and gait; reflexes 2+ and symmetric throughout          Psych:   Normal mood, affect, hygiene and grooming.          Assessment & Plan:  Routine general medical examination at a health care facility - Plan: POCT occult blood stool, CBC with Differential, Comprehensive metabolic panel, Lipid panel  Amputation of foot, right, traumatic  Smoker - Plan: Fluticasone Furoate-Vilanterol (BREO ELLIPTA) 100-25 MCG/INH AEPB  Obesity, Class III, BMI 40-49.9 (morbid obesity)  Guaiac positive stools - Plan: POCT occult blood stool, Ambulatory referral to Gastroenterology  DVT (deep vein thrombosis) in pregnancy  Asthma, chronic  Hypercholesterolemia  discussed possibly evaluating the sleep apnea but he is not interested at this time stating he didn't think he could handle the mask. Also discussed the XARELTO and risk of bleeding versus the benefits. Explained that we will keep him on this for 6 months. Continue his Neurontin at the present dosing. Recommend using Advil or Aleve prior to his physical activity to see if this will help with his pain and spasm and if continued  difficulty, call me. I will consider using a muscle relaxer. Stated there right now trying to quit smoking would probably not be a good idea. I would rather have him work on his physical activities and making dietary adjustments. Referral made for guaiac-positive stool.

## 2013-05-28 ENCOUNTER — Ambulatory Visit: Payer: Managed Care, Other (non HMO) | Admitting: Physical Therapy

## 2013-05-28 ENCOUNTER — Encounter: Payer: Self-pay | Admitting: Internal Medicine

## 2013-06-02 ENCOUNTER — Ambulatory Visit: Payer: Managed Care, Other (non HMO) | Admitting: Physical Therapy

## 2013-06-04 ENCOUNTER — Ambulatory Visit: Payer: Managed Care, Other (non HMO) | Admitting: Physical Therapy

## 2013-06-08 ENCOUNTER — Telehealth: Payer: Self-pay | Admitting: Family Medicine

## 2013-06-08 ENCOUNTER — Ambulatory Visit: Payer: 59 | Attending: Orthopedic Surgery | Admitting: Physical Therapy

## 2013-06-08 DIAGNOSIS — R5381 Other malaise: Secondary | ICD-10-CM | POA: Insufficient documentation

## 2013-06-08 DIAGNOSIS — IMO0001 Reserved for inherently not codable concepts without codable children: Secondary | ICD-10-CM | POA: Insufficient documentation

## 2013-06-08 DIAGNOSIS — R269 Unspecified abnormalities of gait and mobility: Secondary | ICD-10-CM | POA: Insufficient documentation

## 2013-06-08 MED ORDER — RIVAROXABAN 20 MG PO TABS: 20.0000 mg | ORAL_TABLET | Freq: Every day | ORAL | Status: DC

## 2013-06-08 MED ORDER — GABAPENTIN 600 MG PO TABS: 600.0000 mg | ORAL_TABLET | Freq: Two times a day (BID) | ORAL | Status: DC

## 2013-06-08 NOTE — Telephone Encounter (Signed)
Wife notified.

## 2013-06-08 NOTE — Telephone Encounter (Signed)
Let him know that I called in the [place]. Once he paces the initial pack have him go to what I'm calling in. I also renewed his Neurontin.

## 2013-06-08 NOTE — Telephone Encounter (Signed)
Pt's wife called and stated that pt is confused on medication. She states he is unsure if he needs to state on xarelto. He was on started pack. Pt also needs refill on gabapentin. Please inform pt of status of xarelto and refill gabapentin. Pt uses [facility] on [address]. THIS IS A NEW PHARMACY

## 2013-06-10 ENCOUNTER — Ambulatory Visit: Payer: 59 | Admitting: Physical Therapy

## 2013-06-11 ENCOUNTER — Encounter: Payer: Self-pay | Admitting: Family Medicine

## 2013-06-11 ENCOUNTER — Ambulatory Visit (HOSPITAL_COMMUNITY)
Admission: RE | Admit: 2013-06-11 | Discharge: 2013-06-11 | Disposition: A | Discharge status: Home / Self Care | Payer: 59 | Source: Ambulatory Visit | Attending: Family Medicine | Admitting: Family Medicine

## 2013-06-11 ENCOUNTER — Other Ambulatory Visit: Payer: Self-pay | Admitting: *Deleted

## 2013-06-11 ENCOUNTER — Ambulatory Visit (INDEPENDENT_AMBULATORY_CARE_PROVIDER_SITE_OTHER): Payer: Managed Care, Other (non HMO) | Admitting: Family Medicine

## 2013-06-11 VITALS — BP 94/60

## 2013-06-11 DIAGNOSIS — R7989 Other specified abnormal findings of blood chemistry: Secondary | ICD-10-CM

## 2013-06-11 DIAGNOSIS — I959 Hypotension, unspecified: Secondary | ICD-10-CM

## 2013-06-11 DIAGNOSIS — Z86718 Personal history of other venous thrombosis and embolism: Secondary | ICD-10-CM

## 2013-06-11 LAB — BASIC METABOLIC PANEL
BUN: 52 mg/dL — ABNORMAL HIGH (ref 6–23)
CO2: 25 meq/L (ref 19–32)
Calcium: 9.5 mg/dL (ref 8.4–10.5)
Chloride: 96 mEq/L (ref 96–112)
Creat: 1.83 mg/dL — ABNORMAL HIGH (ref 0.50–1.35)
Glucose, Bld: 119 mg/dL — ABNORMAL HIGH (ref 70–99)
Potassium: 4.7 mEq/L (ref 3.5–5.3)
SODIUM: 137 meq/L (ref 135–145)

## 2013-06-11 LAB — CBC WITH DIFFERENTIAL/PLATELET
Basophils Absolute: 0 10*3/uL (ref 0.0–0.1)
Basophils Relative: 0 % (ref 0–1)
EOS ABS: 0.2 10*3/uL (ref 0.0–0.7)
Eosinophils Relative: 2 % (ref 0–5)
HCT: 42.3 % (ref 39.0–52.0)
HEMOGLOBIN: 13.8 g/dL (ref 13.0–17.0)
LYMPHS ABS: 1.9 10*3/uL (ref 0.7–4.0)
Lymphocytes Relative: 17 % (ref 12–46)
MCH: 26 pg (ref 26.0–34.0)
MCHC: 32.6 g/dL (ref 30.0–36.0)
MCV: 79.8 fL (ref 78.0–100.0)
Monocytes Absolute: 0.8 10*3/uL (ref 0.1–1.0)
Monocytes Relative: 7 % (ref 3–12)
NEUTROS ABS: 8.1 10*3/uL — AB (ref 1.7–7.7)
NEUTROS PCT: 74 % (ref 43–77)
Platelets: 272 10*3/uL (ref 150–400)
RBC: 5.3 MIL/uL (ref 4.22–5.81)
RDW: 14.9 % (ref 11.5–15.5)
WBC: 10.9 10*3/uL — AB (ref 4.0–10.5)

## 2013-06-11 LAB — D-DIMER, QUANTITATIVE (NOT AT ARMC): D DIMER QUANT: 2.48 ug{FEU}/mL — AB (ref 0.00–0.48)

## 2013-06-11 MED ORDER — TECHNETIUM TO 99M ALBUMIN AGGREGATED
6.0000 | Freq: Once | INTRAVENOUS | Status: AC | PRN
  Administered 2013-06-11: 6 via INTRAVENOUS

## 2013-06-11 MED ORDER — TECHNETIUM TC 99M DIETHYLENETRIAME-PENTAACETIC ACID: 40.0000 | Freq: Once | INTRAVENOUS | Status: DC | PRN

## 2013-06-11 NOTE — Progress Notes (Signed)
   Subjective:    Patient ID: Dean Bowers, male    DOB: 1970-04-04, 43 y.o.   MRN: [id number]  HPI 2 days ago he noted the onset of weakness as well as increased muscle spasms area no chest pain, shortness of breath, fever, chills, diaphoresis. He recently had evidence of DVT however CT scan was negative.   Review of Systems     Objective:   Physical Exam Alert and in no distress. Blood pressure was difficult to truly assess however he did have a palpable reading of 120. Lungs are clear to auscultation. Cardiac exam was regular rhythm without murmurs or gallops. Exam of the left leg showed negative Homans sign.      Assessment & Plan:  Hypotension, unspecified - Plan: CBC with Differential, Basic Metabolic Panel, D-dimer, quantitative  History of DVT (deep vein thrombosis)  the d-dimer was elevated and a CT was ordered which was read as low threshold. His renal function has deteriorated. He is to return to the office tomorrow for further evaluation.

## 2013-06-12 ENCOUNTER — Encounter: Payer: Self-pay | Admitting: Family Medicine

## 2013-06-12 ENCOUNTER — Ambulatory Visit (INDEPENDENT_AMBULATORY_CARE_PROVIDER_SITE_OTHER): Payer: Managed Care, Other (non HMO) | Admitting: Family Medicine

## 2013-06-12 VITALS — BP 100/60 | HR 80

## 2013-06-12 DIAGNOSIS — R319 Hematuria, unspecified: Secondary | ICD-10-CM

## 2013-06-12 DIAGNOSIS — Z86718 Personal history of other venous thrombosis and embolism: Secondary | ICD-10-CM

## 2013-06-12 DIAGNOSIS — N289 Disorder of kidney and ureter, unspecified: Secondary | ICD-10-CM

## 2013-06-12 LAB — POCT URINALYSIS DIPSTICK
BILIRUBIN UA: NEGATIVE
GLUCOSE UA: NEGATIVE
KETONES UA: NEGATIVE
LEUKOCYTES UA: NEGATIVE
Nitrite, UA: NEGATIVE
PH UA: 5
Spec Grav, UA: 1.02
Urobilinogen, UA: NEGATIVE

## 2013-06-12 LAB — BASIC METABOLIC PANEL
BUN: 40 mg/dL — AB (ref 6–23)
CALCIUM: 8.9 mg/dL (ref 8.4–10.5)
CHLORIDE: 102 meq/L (ref 96–112)
CO2: 26 meq/L (ref 19–32)
CREATININE: 0.9 mg/dL (ref 0.50–1.35)
GLUCOSE: 99 mg/dL (ref 70–99)
Potassium: 5.1 mEq/L (ref 3.5–5.3)
Sodium: 143 mEq/L (ref 135–145)

## 2013-06-12 LAB — CBC WITH DIFFERENTIAL/PLATELET
Basophils Absolute: 0 10*3/uL (ref 0.0–0.1)
Eosinophils Absolute: 0 10*3/uL (ref 0.0–0.7)
HCT: 45.6 % (ref 39.0–52.0)
HEMOGLOBIN: 14.6 g/dL (ref 13.0–17.0)
LYMPHS ABS: 2 10*3/uL (ref 0.7–4.0)
Lymphocytes Relative: 25 % (ref 12–46)
MCH: 29.5 pg (ref 26.0–34.0)
MCHC: 32.1 g/dL (ref 30.0–36.0)
MCV: 92.1 fL (ref 78.0–100.0)
MONOS PCT: 3 % (ref 3–12)
Monocytes Absolute: 0.2 10*3/uL (ref 0.1–1.0)
NEUTROS ABS: 5.6 10*3/uL (ref 1.7–7.7)
NEUTROS PCT: 72 % (ref 43–77)
Platelets: 209 10*3/uL (ref 150–400)
RBC: 4.95 MIL/uL (ref 4.22–5.81)
RDW: 12.8 % (ref 11.5–15.5)
WBC: 7.8 10*3/uL (ref 4.0–10.5)

## 2013-06-12 NOTE — Progress Notes (Signed)
   Subjective:    Patient ID: Dean Bowers, male    DOB: 01/26/1971, 43 y.o.   MRN: [id number]  HPI He is here for followup. He was seen yesterday due to fatigue and low blood pressure. In the office his blood pressure was difficult to assess but I did get a palpable of around 120. The initial workup was negative for PE. Blood work did show elevated BUN and creatinine. No fever, chills, dysuria, abdominal or back pain other than his normal back spasms. He notes that he has noted intermittent brownish yellow urine over the last month but again no other symptoms associated with it. He has been on [place] for treatment of DVT for almost 3 weeks. He is on the 20 mg per day.   Review of Systems     Objective:   Physical Exam Alert and in no distress. Abdominal exam shows no masses or tenderness. Urine microscopic showed RBCs, TNTC       Assessment & Plan:  Poor kidney function - Plan: Urinalysis Dipstick, CBC with Differential, Basic Metabolic Panel  Hematuria - Plan: CBC with Differential, Basic Metabolic Panel  History of DVT (deep vein thrombosis) - Plan: CBC with Differential, Basic Metabolic Panel  appointment was set up for him to see urology today to further evaluate hematuria and diminished renal function.

## 2013-06-15 ENCOUNTER — Ambulatory Visit: Payer: 59 | Admitting: Physical Therapy

## 2013-06-15 ENCOUNTER — Telehealth: Payer: Self-pay

## 2013-06-15 DIAGNOSIS — M25562 Pain in left knee: Secondary | ICD-10-CM

## 2013-06-15 NOTE — Telephone Encounter (Signed)
Error

## 2013-06-16 ENCOUNTER — Ambulatory Visit (INDEPENDENT_AMBULATORY_CARE_PROVIDER_SITE_OTHER): Payer: 59 | Admitting: Internal Medicine

## 2013-06-16 DIAGNOSIS — F17200 Nicotine dependence, unspecified, uncomplicated: Secondary | ICD-10-CM

## 2013-06-16 DIAGNOSIS — R0602 Shortness of breath: Secondary | ICD-10-CM

## 2013-06-16 LAB — PULMONARY FUNCTION TEST
DL/VA % pred: 137 %
DL/VA: 6.33 ml/min/mmHg/L
DLCO UNC % PRED: 95 %
DLCO UNC: 29.72 ml/min/mmHg
FEF 25-75 POST: 2.05 L/s
FEF 25-75 Pre: 1.86 L/sec
FEF2575-%Change-Post: 10 %
FEF2575-%Pred-Post: 55 %
FEF2575-%Pred-Pre: 50 %
FEV1-%Change-Post: 1 %
FEV1-%Pred-Post: 53 %
FEV1-%Pred-Pre: 52 %
FEV1-Post: 2.13 L
FEV1-Pre: 2.09 L
FEV1FVC-%Change-Post: 1 %
FEV1FVC-%Pred-Pre: 101 %
FEV6-%Change-Post: 0 %
FEV6-%Pred-Post: 53 %
FEV6-%Pred-Pre: 53 %
FEV6-POST: 2.62 L
FEV6-Pre: 2.6 L
FEV6FVC-%CHANGE-POST: 0 %
FEV6FVC-%PRED-PRE: 102 %
FEV6FVC-%Pred-Post: 103 %
FVC-%CHANGE-POST: 0 %
FVC-%PRED-PRE: 51 %
FVC-%Pred-Post: 51 %
FVC-PRE: 2.6 L
FVC-Post: 2.62 L
POST FEV6/FVC RATIO: 100 %
PRE FEV1/FVC RATIO: 80 %
PRE FEV6/FVC RATIO: 100 %
Post FEV1/FVC ratio: 81 %
RV % PRED: 112 %
RV: 2.08 L
TLC % pred: 72 %
TLC: 4.92 L

## 2013-06-16 NOTE — Progress Notes (Signed)
PFT done today. 

## 2013-06-17 ENCOUNTER — Ambulatory Visit: Payer: 59 | Admitting: Physical Therapy

## 2013-06-20 ENCOUNTER — Other Ambulatory Visit: Payer: 59

## 2013-06-23 ENCOUNTER — Telehealth: Payer: Self-pay | Admitting: Family Medicine

## 2013-06-23 NOTE — Telephone Encounter (Signed)
Pt's wife called and stated that he was told by you at a previous office visit that when he completed scans he could be given a rx for a muscle relaxor. Those scans have now been completed. She states that you talked about flexeril being prescribed. Pt also states he is interested in being set up with a nutritionist.

## 2013-06-24 MED ORDER — CYCLOBENZAPRINE HCL 10 MG PO TABS: 10.0000 mg | ORAL_TABLET | Freq: Three times a day (TID) | ORAL | Status: DC | PRN

## 2013-06-25 NOTE — Telephone Encounter (Signed)
Medication called in 

## 2013-07-06 ENCOUNTER — Ambulatory Visit (INDEPENDENT_AMBULATORY_CARE_PROVIDER_SITE_OTHER): Payer: 59 | Admitting: Family Medicine

## 2013-07-06 DIAGNOSIS — S98911A Complete traumatic amputation of right foot, level unspecified, initial encounter: Secondary | ICD-10-CM

## 2013-07-06 DIAGNOSIS — S98919A Complete traumatic amputation of unspecified foot, level unspecified, initial encounter: Secondary | ICD-10-CM

## 2013-07-06 NOTE — Progress Notes (Signed)
   Subjective:    Patient ID: Dean Bowers, male    DOB: [birth year], 43 y.o.   MRN: [id number]  HPI His wife is here today to have FMLA forms filled out. 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 At this point that is occurring much less frequently. He is now able to drive and does have a car equipped for hand use. He sometimes still has difficulty but really only has one more plump scheduled in the near future. He would also like to be referred for nutrition counseling.  Review of Systems     Objective:   Physical Exam Not examined       Assessment & Plan:  Morbid obesity - Plan: Amb ref to Medical Nutrition Therapy-MNT  Amputation of foot, right, traumatic  FMLA form was filled out.

## 2013-07-16 ENCOUNTER — Encounter: Payer: Self-pay | Admitting: Internal Medicine

## 2013-07-20 ENCOUNTER — Ambulatory Visit (INDEPENDENT_AMBULATORY_CARE_PROVIDER_SITE_OTHER): Payer: 59 | Admitting: Internal Medicine

## 2013-07-20 ENCOUNTER — Encounter: Payer: Self-pay | Admitting: Internal Medicine

## 2013-07-20 VITALS — BP 120/76 | HR 80 | Ht 70.0 in | Wt 380.0 lb

## 2013-07-20 DIAGNOSIS — R195 Other fecal abnormalities: Secondary | ICD-10-CM

## 2013-07-20 DIAGNOSIS — I82409 Acute embolism and thrombosis of unspecified deep veins of unspecified lower extremity: Secondary | ICD-10-CM

## 2013-07-20 DIAGNOSIS — Z7901 Long term (current) use of anticoagulants: Secondary | ICD-10-CM
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
## 2013-07-20 NOTE — Patient Instructions (Signed)
Follow up in office in early October 2015  Call our office if you experience sever abdominal pain, rectal bleeding.

## 2013-07-21 ENCOUNTER — Telehealth: Payer: Self-pay | Admitting: Internal Medicine

## 2013-07-21 NOTE — Telephone Encounter (Signed)
Will plan to see the patient back in late September 2015 to arrange colonoscopy

## 2013-07-21 NOTE — Telephone Encounter (Signed)
Message copied by Jerene Bears on Tue Jul 21, 2013 10:43 AM ------      Message from: Denita Lung      Created: Tue Jul 21, 2013  8:12 AM       I'm Ok with waiting. He is a set up for further DVT so I'm treating for 6 mo      Dean Bowers      ----- Message -----         From: Jerene Bears, MD         Sent: 07/20/2013   5:59 PM           To: Denita Lung, MD            Billie Ruddy saw Lanette Hampshire in clinic today. We discussed colonoscopy to evaluate heme-positive stool. Given his DVT and now with Xarelto he would prefer to wait on colonoscopy for several months until he is off Xarelto. He is under the impression of treatment with anticoagulants for 6 months. I wanted to get your opinion as to when he can safely stop this medication, and if there are plans to stop this medication soon (after 3 or so additional months), then we will likely delay colonoscopy until after that time. Thanks for the consult and your advice.        Jay Pyrtle      [facility]        ------

## 2013-08-08 ENCOUNTER — Other Ambulatory Visit: Payer: Self-pay | Admitting: Medical

## 2013-08-12 ENCOUNTER — Telehealth: Payer: Self-pay | Admitting: Family Medicine

## 2013-08-12 MED ORDER — CYCLOBENZAPRINE HCL 10 MG PO TABS: 10.0000 mg | ORAL_TABLET | Freq: Three times a day (TID) | ORAL | Status: DC | PRN

## 2013-08-12 MED ORDER — ALPRAZOLAM 0.5 MG PO TABS: ORAL_TABLET | ORAL | Status: DC

## 2013-08-12 NOTE — Telephone Encounter (Signed)
OK to refill

## 2013-08-14 ENCOUNTER — Encounter: Payer: Self-pay | Admitting: Dietician

## 2013-08-14 ENCOUNTER — Encounter: Payer: 59 | Attending: Family Medicine | Admitting: Dietician

## 2013-08-14 VITALS — Ht 69.0 in | Wt 388.0 lb

## 2013-08-14 DIAGNOSIS — Z713 Dietary counseling and surveillance: Secondary | ICD-10-CM | POA: Diagnosis not present

## 2013-08-14 DIAGNOSIS — S88119A Complete traumatic amputation at level between knee and ankle, unspecified lower leg, initial encounter: Secondary | ICD-10-CM | POA: Diagnosis not present
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
## 2013-08-14 NOTE — Patient Instructions (Addendum)
-   Make half your plate fruits and non-starchy vegetables.  Make 1/4 plate lean protein and the last 1/4 carbohydrates (choose those with more fiber). - Eat 3 regular meals and 1-2 snacks a day. - Plan ahead for meals/meal times. - Practice mindful eating. Limit distractions and pay attention to hunger/fullness. - If you still feel hungry wait 10 minutes before eating more. - Use smaller plates, bowl, and glasses, and serving spoons. - Reduce sugary beverages like sweet tea, soda, and fruit juices.  Dilute juices with water and try to stick to 8 oz. Or 1 cup at a time. - Choose water, or flavored water with crystal light, lemon or lime wedges.  Make sweet tea with splenda or mix half unsweet and sweet tea.

## 2013-09-02 ENCOUNTER — Encounter: Payer: Self-pay | Admitting: Internal Medicine

## 2013-10-02 ENCOUNTER — Other Ambulatory Visit: Payer: Self-pay | Admitting: Medical

## 2013-10-02 ENCOUNTER — Other Ambulatory Visit: Payer: Self-pay | Admitting: Family Medicine

## 2013-10-05 ENCOUNTER — Telehealth: Payer: Self-pay | Admitting: Internal Medicine

## 2013-10-05 NOTE — Telephone Encounter (Signed)
Refill request for cyclobenzaprine 10mg  to [facility]

## 2013-10-05 NOTE — Telephone Encounter (Signed)
Okay to renew

## 2013-10-05 NOTE — Telephone Encounter (Signed)
Is this okay?

## 2013-10-06 MED ORDER — CYCLOBENZAPRINE HCL 10 MG PO TABS: ORAL_TABLET | ORAL | Status: DC

## 2013-10-06 NOTE — Telephone Encounter (Signed)
This should have been taking care of yesterday so if not please take care of it

## 2013-10-06 NOTE — Telephone Encounter (Signed)
done

## 2013-10-14 ENCOUNTER — Encounter: Payer: Self-pay | Admitting: Family Medicine

## 2013-10-14 ENCOUNTER — Ambulatory Visit (INDEPENDENT_AMBULATORY_CARE_PROVIDER_SITE_OTHER): Payer: 59 | Admitting: Family Medicine

## 2013-10-14 VITALS — BP 124/80 | HR 116 | Temp 98.6°F | Wt 384.0 lb

## 2013-10-14 DIAGNOSIS — L03319 Cellulitis of trunk, unspecified: Secondary | ICD-10-CM

## 2013-10-14 DIAGNOSIS — L03114 Cellulitis of left upper limb: Secondary | ICD-10-CM

## 2013-10-14 DIAGNOSIS — L03311 Cellulitis of abdominal wall: Secondary | ICD-10-CM

## 2013-10-14 DIAGNOSIS — IMO0002 Reserved for concepts with insufficient information to code with codable children: Secondary | ICD-10-CM

## 2013-10-14 DIAGNOSIS — L02219 Cutaneous abscess of trunk, unspecified: Secondary | ICD-10-CM

## 2013-10-14 MED ORDER — DOXYCYCLINE HYCLATE 100 MG PO TABS: 100.0000 mg | ORAL_TABLET | Freq: Two times a day (BID) | ORAL | Status: DC

## 2013-10-14 NOTE — Progress Notes (Signed)
   Subjective:    Patient ID: Dean Bowers, male    DOB: 08/30/1970, 43 y.o.   MRN: [id number]  HPI He is here for evaluation of a several day history of difficulty with the wound on his left abdomen and left forearm. It started off with a very small lesion is now has gotten bigger as well as surrounding erythema. He also has questions about left knee pain. This started after he started into an exercise program.   Review of Systems     Objective:   Physical Exam Alert and in no distress. Exam of his abdomen does show a large area of erythema of approximately 8 x 12" induration of roughly 3 inches in diameter. The lesion present on his left forearm shows induration of approximately 1-1/2 inches with surrounding erythema of 4-5 inches. Both of these are firm and not fluctuant. Both areas were outlined with an ink pen.      Assessment & Plan:  Cellulitis of abdominal wall - Plan: doxycycline (VIBRA-TABS) 100 MG tablet, Wound culture  Cellulitis of left upper extremity - Plan: doxycycline (VIBRA-TABS) 100 MG tablet, Wound culture  he will return here in 2 days for reevaluation and possible I and D. Discussed the knee pain and recommend backing off on physical activity that is slowly returning to physical activity and if continued difficulty, orthopedic referral will be made.

## 2013-10-16 ENCOUNTER — Ambulatory Visit (INDEPENDENT_AMBULATORY_CARE_PROVIDER_SITE_OTHER): Payer: 59 | Admitting: Family Medicine

## 2013-10-16 DIAGNOSIS — L03311 Cellulitis of abdominal wall: Secondary | ICD-10-CM

## 2013-10-16 DIAGNOSIS — L03114 Cellulitis of left upper limb: Secondary | ICD-10-CM

## 2013-10-16 DIAGNOSIS — IMO0002 Reserved for concepts with insufficient information to code with codable children: Secondary | ICD-10-CM

## 2013-10-16 DIAGNOSIS — L02219 Cutaneous abscess of trunk, unspecified: Secondary | ICD-10-CM | POA: Diagnosis not present

## 2013-10-16 DIAGNOSIS — L03319 Cellulitis of trunk, unspecified: Secondary | ICD-10-CM

## 2013-10-16 MED ORDER — SULFAMETHOXAZOLE-TMP DS 800-160 MG PO TABS: 1.0000 | ORAL_TABLET | Freq: Two times a day (BID) | ORAL | Status: DC

## 2013-10-16 MED ORDER — CLINDAMYCIN HCL 300 MG PO CAPS: 300.0000 mg | ORAL_CAPSULE | Freq: Four times a day (QID) | ORAL | Status: DC

## 2013-10-16 NOTE — Progress Notes (Signed)
   Subjective:    Patient ID: Dean Bowers, male    DOB: 07/08/1970, 43 y.o.   MRN: [id number]  HPI He is here for a recheck. The erythema has gotten much worse on his abdomen and did improve slightly on his arm.   Review of Systems     Objective:   Physical Exam Diffuse erythema and warmth is noted on the abdomen and the lesion is approximately 3 cm in size. He is still firm. The lesion on the forearm does seem to be less swollen but still quite red and warm as well as no evidence of induration.        Assessment & Plan:  Cellulitis of abdominal wall - Plan: sulfamethoxazole-trimethoprim (BACTRIM DS) 800-160 MG per tablet, clindamycin (CLEOCIN) 300 MG capsule, Wound culture  Cellulitis of left upper extremity - Plan: sulfamethoxazole-trimethoprim (BACTRIM DS) 800-160 MG per tablet, clindamycin (CLEOCIN) 300 MG capsule  both areas were prepped and injected with Xylocaine. 3 CM incision was made anterior material was expressed from both wounds. The wounds were cleaned with Betadine and packed with out a form gauze. He will be placed on Bactrim and Cleocin since he did not respond to the doxycycline. Culture was taken from the abdominal wound. He will come back here on Monday for recheck. A picture of the abdominal area was taken on his cell phone camera

## 2013-10-17 LAB — WOUND CULTURE
Gram Stain: NONE SEEN
Gram Stain: NONE SEEN
Gram Stain: NONE SEEN
ORGANISM ID, BACTERIA: NO GROWTH

## 2013-10-19 ENCOUNTER — Ambulatory Visit (INDEPENDENT_AMBULATORY_CARE_PROVIDER_SITE_OTHER): Payer: Managed Care, Other (non HMO) | Admitting: Family Medicine

## 2013-10-19 DIAGNOSIS — L02219 Cutaneous abscess of trunk, unspecified: Secondary | ICD-10-CM

## 2013-10-19 DIAGNOSIS — L03114 Cellulitis of left upper limb: Secondary | ICD-10-CM

## 2013-10-19 DIAGNOSIS — L03311 Cellulitis of abdominal wall: Secondary | ICD-10-CM

## 2013-10-19 DIAGNOSIS — L03319 Cellulitis of trunk, unspecified: Secondary | ICD-10-CM

## 2013-10-19 DIAGNOSIS — IMO0002 Reserved for concepts with insufficient information to code with codable children: Secondary | ICD-10-CM

## 2013-10-19 LAB — WOUND CULTURE
GRAM STAIN: NONE SEEN
GRAM STAIN: NONE SEEN

## 2013-10-19 NOTE — Progress Notes (Signed)
   Subjective:    Patient ID: Dean Bowers, male    DOB: 04-23-1970, 43 y.o.   MRN: [id number]  HPI He is here for followup visit. He states that the 2 wounds are doing much better and having very little drainage.   Review of Systems     Objective:   Physical Exam The wound on the arm is less erythematous warm and tender as well as less swelling. The wound on the abdomen is much less erythematous with only minimal amount of drainage. Packing was partially removed from the wound on the arm and partially removed from the one on the abdomen Ulcer did grow out staph aureus sensitive to medicine he is on.    Assessment & Plan:  Cellulitis of abdominal wall  Cellulitis of left upper extremity  he will continue on the antibiotics . Recommend that he irrigate the wound on the arm regularly. He will return here in 2 days for further packing removal.

## 2013-10-21 ENCOUNTER — Ambulatory Visit (INDEPENDENT_AMBULATORY_CARE_PROVIDER_SITE_OTHER): Payer: 59 | Admitting: Family Medicine

## 2013-10-21 DIAGNOSIS — L03311 Cellulitis of abdominal wall: Secondary | ICD-10-CM

## 2013-10-21 DIAGNOSIS — L03114 Cellulitis of left upper limb: Secondary | ICD-10-CM

## 2013-10-21 DIAGNOSIS — L03319 Cellulitis of trunk, unspecified: Secondary | ICD-10-CM

## 2013-10-21 DIAGNOSIS — L02219 Cutaneous abscess of trunk, unspecified: Secondary | ICD-10-CM

## 2013-10-21 DIAGNOSIS — IMO0002 Reserved for concepts with insufficient information to code with codable children: Secondary | ICD-10-CM

## 2013-10-21 NOTE — Progress Notes (Signed)
   Subjective:    Patient ID: Dean Bowers, male    DOB: 08/06/1970, 43 y.o.   MRN: [id number]  HPI He is here for packing removal. He continues on the antibiotic and is having no difficulty.   Review of Systems     Objective:   Physical Exam Alert and in no distress. Exam of the abdomen does show some slight erythema however the induration is much less. Left arm is healing in slowly and again much less induration and in the past.      Assessment & Plan:  Cellulitis of abdominal wall  Cellulitis of left upper extremity  continue on antibiotic and if any questions concerning this, he will call for refill. He will irrigate these wounds until they heal over with a dry eschar.

## 2013-10-21 NOTE — Patient Instructions (Signed)
Continue to irrigate the wound area and take all the antibiotic. If any questions concerning needing more antibiotic call.

## 2013-10-26 ENCOUNTER — Other Ambulatory Visit: Payer: Self-pay | Admitting: Family Medicine

## 2013-10-26 NOTE — Telephone Encounter (Signed)
IS THIS OKAY 

## 2013-11-12 ENCOUNTER — Other Ambulatory Visit: Payer: Self-pay | Admitting: Medical

## 2013-11-17 ENCOUNTER — Other Ambulatory Visit: Payer: Self-pay | Admitting: Medical

## 2013-11-17 ENCOUNTER — Telehealth: Payer: Self-pay | Admitting: Family Medicine

## 2013-11-17 ENCOUNTER — Other Ambulatory Visit: Payer: Self-pay | Admitting: Family Medicine

## 2013-11-17 NOTE — Telephone Encounter (Signed)
Is this okay?

## 2013-11-17 NOTE — Telephone Encounter (Signed)
Okay to renew

## 2013-11-18 MED ORDER — OXYCODONE HCL 10 MG PO TABS: 10.0000 mg | ORAL_TABLET | ORAL | Status: DC | PRN

## 2013-11-18 NOTE — Telephone Encounter (Signed)
Take care of this 

## 2013-11-18 NOTE — Telephone Encounter (Signed)
Oxycodone renewed

## 2013-11-18 NOTE — Telephone Encounter (Signed)
PT CAME AND PICKED UP RX ALREADY

## 2013-11-20 ENCOUNTER — Ambulatory Visit: Payer: 59 | Admitting: Dietician

## 2013-11-23 ENCOUNTER — Telehealth: Payer: Self-pay | Admitting: Family Medicine

## 2013-11-23 NOTE — Telephone Encounter (Signed)
Faxed order over to [facility] for his liners and socks

## 2013-11-23 NOTE — Telephone Encounter (Signed)
Ok

## 2013-11-23 NOTE — Telephone Encounter (Signed)
Pt wife called and request new order for liners and socks for pt prosthetics sent to [facility] attn:  Joann   Fax [phone]

## 2013-11-30 ENCOUNTER — Telehealth: Payer: Self-pay | Admitting: Family Medicine

## 2013-11-30 NOTE — Telephone Encounter (Signed)
Take care of this 

## 2013-11-30 NOTE — Telephone Encounter (Signed)
Pt's wife called and stated her ins company did not want to pay for liners for his prosthetic leg unless his current ones where worn out and causing issues. She states the order must include the words "worn and interfering with fit of prosthetics" in order for insurance to pay. Please send in corrected order. Pt's wife can be reached at [phone].

## 2013-12-01 NOTE — Telephone Encounter (Signed)
This has been put up front wife has been called

## 2013-12-01 NOTE — Telephone Encounter (Signed)
Gave to Dr.Lalonde to sign

## 2013-12-05 ENCOUNTER — Other Ambulatory Visit: Payer: Self-pay | Admitting: Family Medicine

## 2013-12-12 ENCOUNTER — Other Ambulatory Visit: Payer: Self-pay | Admitting: Medical

## 2013-12-17 ENCOUNTER — Other Ambulatory Visit: Payer: Self-pay | Admitting: Medical

## 2013-12-17 NOTE — Telephone Encounter (Signed)
OK to RF

## 2013-12-28 ENCOUNTER — Ambulatory Visit (INDEPENDENT_AMBULATORY_CARE_PROVIDER_SITE_OTHER): Payer: 59 | Admitting: Family Medicine

## 2013-12-28 VITALS — BP 130/78 | HR 72 | Temp 99.2°F | Ht 69.0 in | Wt 386.0 lb

## 2013-12-28 DIAGNOSIS — R319 Hematuria, unspecified: Secondary | ICD-10-CM

## 2013-12-28 DIAGNOSIS — Z720 Tobacco use: Secondary | ICD-10-CM | POA: Diagnosis not present

## 2013-12-28 DIAGNOSIS — R062 Wheezing: Secondary | ICD-10-CM

## 2013-12-28 DIAGNOSIS — R350 Frequency of micturition: Secondary | ICD-10-CM

## 2013-12-28 DIAGNOSIS — J45901 Unspecified asthma with (acute) exacerbation: Secondary | ICD-10-CM

## 2013-12-28 DIAGNOSIS — F17200 Nicotine dependence, unspecified, uncomplicated: Secondary | ICD-10-CM

## 2013-12-28 LAB — POCT URINALYSIS DIPSTICK
BILIRUBIN UA: NEGATIVE
GLUCOSE UA: NEGATIVE
KETONES UA: NEGATIVE
NITRITE UA: NEGATIVE
Spec Grav, UA: 1.015
Urobilinogen, UA: 0.2
pH, UA: 7.5

## 2013-12-28 MED ORDER — AZITHROMYCIN 250 MG PO TABS: ORAL_TABLET | ORAL | Status: DC

## 2013-12-28 MED ORDER — ALBUTEROL SULFATE (2.5 MG/3ML) 0.083% IN NEBU: 2.5000 mg | INHALATION_SOLUTION | Freq: Once | RESPIRATORY_TRACT | Status: DC

## 2013-12-28 MED ORDER — PREDNISONE 20 MG PO TABS: 20.0000 mg | ORAL_TABLET | Freq: Two times a day (BID) | ORAL | Status: DC

## 2013-12-28 NOTE — Patient Instructions (Signed)
  Drink plenty of fluids. Quit smoking. Restart Breo Take the Zpak as directed. Take prednisone twice daily for 5 days. Start Mucinex (plain or DM version, versus getting a separate Delsym syrup for cough suppression) I recommend doing sinus rinses (sinus rinse kit or Neti-pot) to help with sinus pressure and pain.  Consider using Coricidin HBP to help with congestion and runny nose Elevate your head with an extra pillow to help with postnasal drainage.  Return if increasing shortness of breath, ongoing fevers, or other concerns. Flu shot is recommended when you are better Pneumonia vaccine is also recommended due to your smoking.
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
## 2013-12-29 ENCOUNTER — Encounter: Payer: Self-pay | Admitting: Family Medicine

## 2013-12-29 DIAGNOSIS — J45909 Unspecified asthma, uncomplicated: Secondary | ICD-10-CM | POA: Insufficient documentation

## 2014-01-01 LAB — CULTURE, URINE COMPREHENSIVE

## 2014-01-09 ENCOUNTER — Other Ambulatory Visit: Payer: Self-pay | Admitting: Family Medicine

## 2014-01-11 NOTE — Telephone Encounter (Signed)
Is this okay?

## 2014-01-11 NOTE — Telephone Encounter (Signed)
Okay to renew

## 2014-01-13 ENCOUNTER — Other Ambulatory Visit: Payer: Self-pay | Admitting: Family Medicine

## 2014-01-21 HISTORY — PX: VASECTOMY: SHX75

## 2014-01-26 ENCOUNTER — Encounter: Payer: Self-pay | Admitting: Family Medicine

## 2014-01-26 ENCOUNTER — Ambulatory Visit (INDEPENDENT_AMBULATORY_CARE_PROVIDER_SITE_OTHER): Payer: 59 | Admitting: Family Medicine

## 2014-01-26 VITALS — BP 120/78 | HR 100

## 2014-01-26 DIAGNOSIS — Z9852 Vasectomy status: Secondary | ICD-10-CM

## 2014-01-26 DIAGNOSIS — R609 Edema, unspecified: Secondary | ICD-10-CM

## 2014-01-26 DIAGNOSIS — R0683 Snoring: Secondary | ICD-10-CM

## 2014-01-26 LAB — COMPREHENSIVE METABOLIC PANEL
ALT: 25 U/L (ref 0–53)
AST: 16 U/L (ref 0–37)
Albumin: 3.3 g/dL — ABNORMAL LOW (ref 3.5–5.2)
Alkaline Phosphatase: 59 U/L (ref 39–117)
BILIRUBIN TOTAL: 0.2 mg/dL (ref 0.2–1.2)
BUN: 30 mg/dL — ABNORMAL HIGH (ref 6–23)
CO2: 34 mEq/L — ABNORMAL HIGH (ref 19–32)
Calcium: 9.6 mg/dL (ref 8.4–10.5)
Chloride: 98 mEq/L (ref 96–112)
Creat: 0.99 mg/dL (ref 0.50–1.35)
GLUCOSE: 99 mg/dL (ref 70–99)
Potassium: 4.4 mEq/L (ref 3.5–5.3)
SODIUM: 137 meq/L (ref 135–145)
TOTAL PROTEIN: 6.9 g/dL (ref 6.0–8.3)

## 2014-01-26 MED ORDER — PHENTERMINE-TOPIRAMATE ER 7.5-46 MG PO CP24: 1.0000 | ORAL_CAPSULE | Freq: Every day | ORAL | Status: DC

## 2014-01-26 NOTE — Progress Notes (Signed)
   Subjective:    Patient ID: Dean Bowers, male    DOB: 10-21-1970, 43 y.o.   MRN: [id number]  HPI He is here for consult concerning worsening edema in his left leg and to a lesser extent in the right upper thigh. He's had no chest pain, shortness breath, DOE. Review his record indicates his weight continues to go up. He has been to the nutritionist but states that he has had difficulty following her recommendations due to his present living situation. He will be moving into a home which she states will make it easier for him to be compliant with dietary changes that he received when he talked to the nutritionist.. He has questions concerning possible surgical intervention. He recently had a vasectomy and seems to be doing okay with this. He states that the urologist checked his testosterone and it is low. He has had follow-up blood work and is waiting for the final results. He apparently also needs a new sleeve for his prosthetic device.  Review of Systems     Objective:   Physical Exam Alert and in no distress. Weight is recorded. He does have 1+ pitting edema in his thigh on the left and to a lesser extent on the right.       Assessment & Plan:  Obesity, Class III, BMI 40-49.9 (morbid obesity) - Plan: Phentermine-Topiramate (QSYMIA) 7.5-46 MG CP24  Pitting edema - Plan: CBC with Differential, Comprehensive metabolic panel  History of vasectomy  Snoring  I explained that the edema is probably weight related as is his low testosterone and his potential of having sleep apnea. I will do routine blood screening on him. Discussed surgical intervention versus working harder on his diet and exercise. He apparently will become more physically active. Discussed possibly going to a different exercise facility that has a pool. At the end of the encounter he then mentioned the snoring and I recommended that he return for more further evaluation and consultation concerning this. Over 25 minutes  spent discussing all these issues with him.

## 2014-01-27 LAB — CBC WITH DIFFERENTIAL/PLATELET
BASOS PCT: 0 % (ref 0–1)
Basophils Absolute: 0 10*3/uL (ref 0.0–0.1)
Eosinophils Absolute: 0.2 10*3/uL (ref 0.0–0.7)
Eosinophils Relative: 2 % (ref 0–5)
HCT: 35.6 % — ABNORMAL LOW (ref 39.0–52.0)
Hemoglobin: 11 g/dL — ABNORMAL LOW (ref 13.0–17.0)
Lymphocytes Relative: 21 % (ref 12–46)
Lymphs Abs: 2.1 10*3/uL (ref 0.7–4.0)
MCH: 22 pg — ABNORMAL LOW (ref 26.0–34.0)
MCHC: 30.9 g/dL (ref 30.0–36.0)
MCV: 71.2 fL — ABNORMAL LOW (ref 78.0–100.0)
MPV: 9 fL — AB (ref 9.4–12.4)
Monocytes Absolute: 1 10*3/uL (ref 0.1–1.0)
Monocytes Relative: 10 % (ref 3–12)
NEUTROS PCT: 67 % (ref 43–77)
Neutro Abs: 6.8 10*3/uL (ref 1.7–7.7)
Platelets: 321 10*3/uL (ref 150–400)
RBC: 5 MIL/uL (ref 4.22–5.81)
RDW: 16.3 % — AB (ref 11.5–15.5)
WBC: 10.2 10*3/uL (ref 4.0–10.5)

## 2014-01-28 ENCOUNTER — Emergency Department (INDEPENDENT_AMBULATORY_CARE_PROVIDER_SITE_OTHER)
Admission: EM | Admit: 2014-01-28 | Discharge: 2014-01-28 | Disposition: A | Discharge status: Home / Self Care | Payer: 59 | Source: Home / Self Care | Attending: Emergency Medicine | Admitting: Emergency Medicine

## 2014-01-28 ENCOUNTER — Encounter (HOSPITAL_COMMUNITY): Payer: Self-pay | Admitting: Emergency Medicine

## 2014-01-28 ENCOUNTER — Ambulatory Visit (HOSPITAL_COMMUNITY): Payer: 59 | Attending: Emergency Medicine

## 2014-01-28 DIAGNOSIS — R0602 Shortness of breath: Secondary | ICD-10-CM | POA: Insufficient documentation

## 2014-01-28 DIAGNOSIS — R05 Cough: Secondary | ICD-10-CM | POA: Insufficient documentation

## 2014-01-28 DIAGNOSIS — R0609 Other forms of dyspnea: Secondary | ICD-10-CM

## 2014-01-28 MED ORDER — FUROSEMIDE 20 MG PO TABS: 20.0000 mg | ORAL_TABLET | Freq: Two times a day (BID) | ORAL | Status: DC

## 2014-01-28 NOTE — Discharge Instructions (Signed)
You have some fluid in your legs/belly and a small amount in the area around your lungs. Take lasix twice a day (once in the morning and once in the early afternoon).  Follow up with your doctor in the next week for recheck and evaluation for heart failure. If your shortness of breath or swelling is getting worse or you develop chest pain, please go to the emergency room.

## 2014-01-28 NOTE — ED Notes (Signed)
C/O  Sob x 7 to 10 days.   States fluid in lower abdomen and legs.  States "recently had a vasectomy".   Having SOB with laying flat and with up walking around.
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
## 2014-02-02 ENCOUNTER — Telehealth: Payer: Self-pay | Admitting: Medical

## 2014-02-03 ENCOUNTER — Ambulatory Visit (INDEPENDENT_AMBULATORY_CARE_PROVIDER_SITE_OTHER): Payer: 59 | Admitting: Family Medicine

## 2014-02-03 ENCOUNTER — Encounter: Payer: Self-pay | Admitting: Family Medicine

## 2014-02-03 VITALS — BP 114/50 | HR 100 | Wt 380.0 lb

## 2014-02-03 DIAGNOSIS — R609 Edema, unspecified: Secondary | ICD-10-CM

## 2014-02-03 DIAGNOSIS — R9431 Abnormal electrocardiogram [ECG] [EKG]: Secondary | ICD-10-CM

## 2014-02-03 DIAGNOSIS — R0683 Snoring: Secondary | ICD-10-CM

## 2014-02-03 LAB — BASIC METABOLIC PANEL
BUN: 23 mg/dL (ref 6–23)
CALCIUM: 8.6 mg/dL (ref 8.4–10.5)
CO2: 33 meq/L — AB (ref 19–32)
CREATININE: 0.94 mg/dL (ref 0.50–1.35)
Chloride: 95 mEq/L — ABNORMAL LOW (ref 96–112)
Glucose, Bld: 113 mg/dL — ABNORMAL HIGH (ref 70–99)
Potassium: 3.9 mEq/L (ref 3.5–5.3)
SODIUM: 140 meq/L (ref 135–145)

## 2014-02-03 NOTE — Progress Notes (Signed)
   Subjective:    Patient ID: Dean Bowers, male    DOB: 05-Mar-1970, 43 y.o.   MRN: [id number]  HPI  he is here for follow-up visit concerning continued difficulty with edema. He was seen in an emergency room. It did show evidence of right axis deviation. He was placed on Lasix. Presently he is taking 40 mg  Twice a day and states that this has helped with his edema. There is concern about right heart failure. He also has a history of excessive snoring and stopping breathing according to his wife.  Review of Systems     Objective:   Physical Exam  alert and in no distress. Cardiac exam shows a pulse of 100. No murmurs or gallops noted. Lungs are clear to auscultation. Edema is noted in the lower abdominal area and in the left leg and right stump.       Assessment & Plan:  Pitting edema - Plan: Basic Metabolic Panel, Brain natriuretic peptide, Ambulatory referral to Cardiology  Right axis deviation - Plan: Basic Metabolic Panel, Brain natriuretic peptide, Ambulatory referral to Cardiology, Nocturnal polysomnography  Morbid obesity  Snoring - Plan: Nocturnal polysomnography  he will continue on 40 mg twice a day of the Lasix. He could certainly be in right heart failure and therefore cardiology evaluation and sleep apnea eval is appropriate.

## 2014-02-04 LAB — BRAIN NATRIURETIC PEPTIDE: Brain Natriuretic Peptide: 126.2 pg/mL — ABNORMAL HIGH (ref 0.0–100.0)

## 2014-02-05 DIAGNOSIS — Z8709 Personal history of other diseases of the respiratory system: Secondary | ICD-10-CM

## 2014-02-05 HISTORY — DX: Personal history of other diseases of the respiratory system: Z87.09

## 2014-02-07 ENCOUNTER — Emergency Department (HOSPITAL_COMMUNITY): Payer: 59

## 2014-02-07 ENCOUNTER — Inpatient Hospital Stay (HOSPITAL_COMMUNITY)
Admission: EM | Admit: 2014-02-07 | Discharge: 2014-02-15 | DRG: 853 | Disposition: A | Discharge status: Home Health | Payer: 59 | Attending: Internal Medicine | Admitting: Internal Medicine

## 2014-02-07 ENCOUNTER — Encounter (HOSPITAL_COMMUNITY): Payer: Self-pay

## 2014-02-07 DIAGNOSIS — A419 Sepsis, unspecified organism: Principal | ICD-10-CM | POA: Diagnosis present

## 2014-02-07 DIAGNOSIS — R778 Other specified abnormalities of plasma proteins: Secondary | ICD-10-CM

## 2014-02-07 DIAGNOSIS — Z6841 Body Mass Index (BMI) 40.0 and over, adult: Secondary | ICD-10-CM

## 2014-02-07 DIAGNOSIS — E785 Hyperlipidemia, unspecified: Secondary | ICD-10-CM | POA: Diagnosis present

## 2014-02-07 DIAGNOSIS — R7989 Other specified abnormal findings of blood chemistry: Secondary | ICD-10-CM

## 2014-02-07 DIAGNOSIS — T464X5A Adverse effect of angiotensin-converting-enzyme inhibitors, initial encounter: Secondary | ICD-10-CM | POA: Diagnosis present

## 2014-02-07 DIAGNOSIS — L03115 Cellulitis of right lower limb: Secondary | ICD-10-CM | POA: Diagnosis present

## 2014-02-07 DIAGNOSIS — N179 Acute kidney failure, unspecified: Secondary | ICD-10-CM

## 2014-02-07 DIAGNOSIS — E119 Type 2 diabetes mellitus without complications: Secondary | ICD-10-CM | POA: Diagnosis present

## 2014-02-07 DIAGNOSIS — R740 Nonspecific elevation of levels of transaminase and lactic acid dehydrogenase [LDH]: Secondary | ICD-10-CM | POA: Diagnosis present

## 2014-02-07 DIAGNOSIS — N170 Acute kidney failure with tubular necrosis: Secondary | ICD-10-CM | POA: Diagnosis present

## 2014-02-07 DIAGNOSIS — E66813 Obesity, class 3: Secondary | ICD-10-CM

## 2014-02-07 DIAGNOSIS — G9340 Encephalopathy, unspecified: Secondary | ICD-10-CM | POA: Diagnosis not present

## 2014-02-07 DIAGNOSIS — E869 Volume depletion, unspecified: Secondary | ICD-10-CM | POA: Diagnosis present

## 2014-02-07 DIAGNOSIS — I214 Non-ST elevation (NSTEMI) myocardial infarction: Secondary | ICD-10-CM | POA: Diagnosis present

## 2014-02-07 DIAGNOSIS — I10 Essential (primary) hypertension: Secondary | ICD-10-CM | POA: Diagnosis present

## 2014-02-07 DIAGNOSIS — J9602 Acute respiratory failure with hypercapnia: Secondary | ICD-10-CM | POA: Diagnosis not present

## 2014-02-07 DIAGNOSIS — N19 Unspecified kidney failure: Secondary | ICD-10-CM

## 2014-02-07 DIAGNOSIS — E872 Acidosis: Secondary | ICD-10-CM | POA: Diagnosis not present

## 2014-02-07 DIAGNOSIS — F1210 Cannabis abuse, uncomplicated: Secondary | ICD-10-CM | POA: Diagnosis present

## 2014-02-07 DIAGNOSIS — F1410 Cocaine abuse, uncomplicated: Secondary | ICD-10-CM | POA: Diagnosis present

## 2014-02-07 DIAGNOSIS — F17210 Nicotine dependence, cigarettes, uncomplicated: Secondary | ICD-10-CM | POA: Diagnosis present

## 2014-02-07 DIAGNOSIS — G4733 Obstructive sleep apnea (adult) (pediatric): Secondary | ICD-10-CM | POA: Diagnosis present

## 2014-02-07 DIAGNOSIS — M868X6 Other osteomyelitis, lower leg: Secondary | ICD-10-CM | POA: Diagnosis present

## 2014-02-07 DIAGNOSIS — Z01818 Encounter for other preprocedural examination: Secondary | ICD-10-CM

## 2014-02-07 DIAGNOSIS — T8743 Infection of amputation stump, right lower extremity: Secondary | ICD-10-CM | POA: Diagnosis present

## 2014-02-07 DIAGNOSIS — D638 Anemia in other chronic diseases classified elsewhere: Secondary | ICD-10-CM | POA: Diagnosis present

## 2014-02-07 DIAGNOSIS — R0602 Shortness of breath: Secondary | ICD-10-CM

## 2014-02-07 DIAGNOSIS — F1910 Other psychoactive substance abuse, uncomplicated: Secondary | ICD-10-CM | POA: Diagnosis present

## 2014-02-07 DIAGNOSIS — N390 Urinary tract infection, site not specified: Secondary | ICD-10-CM | POA: Diagnosis present

## 2014-02-07 DIAGNOSIS — E8770 Fluid overload, unspecified: Secondary | ICD-10-CM | POA: Diagnosis not present

## 2014-02-07 DIAGNOSIS — Z4659 Encounter for fitting and adjustment of other gastrointestinal appliance and device: Secondary | ICD-10-CM

## 2014-02-07 DIAGNOSIS — Z7951 Long term (current) use of inhaled steroids: Secondary | ICD-10-CM | POA: Diagnosis not present

## 2014-02-07 DIAGNOSIS — J9600 Acute respiratory failure, unspecified whether with hypoxia or hypercapnia: Secondary | ICD-10-CM

## 2014-02-07 DIAGNOSIS — R6521 Severe sepsis with septic shock: Secondary | ICD-10-CM | POA: Diagnosis not present

## 2014-02-07 DIAGNOSIS — L02419 Cutaneous abscess of limb, unspecified: Secondary | ICD-10-CM

## 2014-02-07 DIAGNOSIS — B9620 Unspecified Escherichia coli [E. coli] as the cause of diseases classified elsewhere: Secondary | ICD-10-CM | POA: Diagnosis present

## 2014-02-07 DIAGNOSIS — L0291 Cutaneous abscess, unspecified: Secondary | ICD-10-CM

## 2014-02-07 DIAGNOSIS — L03116 Cellulitis of left lower limb: Secondary | ICD-10-CM | POA: Diagnosis present

## 2014-02-07 DIAGNOSIS — R109 Unspecified abdominal pain: Secondary | ICD-10-CM

## 2014-02-07 DIAGNOSIS — Z978 Presence of other specified devices: Secondary | ICD-10-CM

## 2014-02-07 DIAGNOSIS — Z79899 Other long term (current) drug therapy: Secondary | ICD-10-CM | POA: Diagnosis not present

## 2014-02-07 DIAGNOSIS — R7401 Elevation of levels of liver transaminase levels: Secondary | ICD-10-CM | POA: Diagnosis present

## 2014-02-07 DIAGNOSIS — R34 Anuria and oliguria: Secondary | ICD-10-CM

## 2014-02-07 DIAGNOSIS — E662 Morbid (severe) obesity with alveolar hypoventilation: Secondary | ICD-10-CM | POA: Diagnosis present

## 2014-02-07 DIAGNOSIS — R112 Nausea with vomiting, unspecified: Secondary | ICD-10-CM | POA: Diagnosis present

## 2014-02-07 DIAGNOSIS — J9690 Respiratory failure, unspecified, unspecified whether with hypoxia or hypercapnia: Secondary | ICD-10-CM

## 2014-02-07 DIAGNOSIS — L03119 Cellulitis of unspecified part of limb: Secondary | ICD-10-CM

## 2014-02-07 LAB — COMPREHENSIVE METABOLIC PANEL
ALBUMIN: 3.4 g/dL — AB (ref 3.5–5.2)
ALK PHOS: 70 U/L (ref 39–117)
ALT: 500 U/L — ABNORMAL HIGH (ref 0–53)
ANION GAP: 13 (ref 5–15)
AST: 525 U/L — ABNORMAL HIGH (ref 0–37)
BUN: 51 mg/dL — AB (ref 6–23)
CO2: 28 mmol/L (ref 19–32)
CREATININE: 5.16 mg/dL — AB (ref 0.50–1.35)
Calcium: 8 mg/dL — ABNORMAL LOW (ref 8.4–10.5)
Chloride: 92 mEq/L — ABNORMAL LOW (ref 96–112)
GFR calc non Af Amer: 12 mL/min — ABNORMAL LOW (ref >90)
GFR, EST AFRICAN AMERICAN: 14 mL/min — AB (ref >90)
GLUCOSE: 85 mg/dL (ref 70–99)
POTASSIUM: 4.2 mmol/L (ref 3.5–5.1)
Sodium: 133 mmol/L — ABNORMAL LOW (ref 135–145)
Total Bilirubin: 0.7 mg/dL (ref 0.3–1.2)
Total Protein: 7.4 g/dL (ref 6.0–8.3)

## 2014-02-07 LAB — URINALYSIS, ROUTINE W REFLEX MICROSCOPIC
Bilirubin Urine: NEGATIVE
GLUCOSE, UA: NEGATIVE mg/dL
Ketones, ur: NEGATIVE mg/dL
Nitrite: NEGATIVE
PH: 6 (ref 5.0–8.0)
Protein, ur: >300 mg/dL — AB
Specific Gravity, Urine: 1.017 (ref 1.005–1.030)
Urobilinogen, UA: 0.2 mg/dL (ref 0.0–1.0)

## 2014-02-07 LAB — CBC WITH DIFFERENTIAL/PLATELET
BASOS ABS: 0 10*3/uL (ref 0.0–0.1)
Basophils Relative: 0 % (ref 0–1)
EOS ABS: 0.1 10*3/uL (ref 0.0–0.7)
Eosinophils Relative: 1 % (ref 0–5)
HCT: 37.7 % — ABNORMAL LOW (ref 39.0–52.0)
HEMOGLOBIN: 10.8 g/dL — AB (ref 13.0–17.0)
Lymphocytes Relative: 14 % (ref 12–46)
Lymphs Abs: 1.9 10*3/uL (ref 0.7–4.0)
MCH: 21.6 pg — ABNORMAL LOW (ref 26.0–34.0)
MCHC: 28.6 g/dL — AB (ref 30.0–36.0)
MCV: 75.4 fL — AB (ref 78.0–100.0)
MONOS PCT: 7 % (ref 3–12)
Monocytes Absolute: 1 10*3/uL (ref 0.1–1.0)
NEUTROS PCT: 79 % — AB (ref 43–77)
Neutro Abs: 11.1 10*3/uL — ABNORMAL HIGH (ref 1.7–7.7)
Platelets: 266 10*3/uL (ref 150–400)
RBC: 5 MIL/uL (ref 4.22–5.81)
RDW: 17.9 % — ABNORMAL HIGH (ref 11.5–15.5)
WBC: 14.2 10*3/uL — ABNORMAL HIGH (ref 4.0–10.5)

## 2014-02-07 LAB — URINE MICROSCOPIC-ADD ON

## 2014-02-07 LAB — I-STAT CHEM 8, ED
BUN: 51 mg/dL — AB (ref 6–23)
CALCIUM ION: 0.95 mmol/L — AB (ref 1.12–1.23)
CHLORIDE: 95 meq/L — AB (ref 96–112)
Creatinine, Ser: 4.7 mg/dL — ABNORMAL HIGH (ref 0.50–1.35)
Glucose, Bld: 89 mg/dL (ref 70–99)
HEMATOCRIT: 45 % (ref 39.0–52.0)
Hemoglobin: 15.3 g/dL (ref 13.0–17.0)
Potassium: 4.2 mmol/L (ref 3.5–5.1)
SODIUM: 135 mmol/L (ref 135–145)
TCO2: 28 mmol/L (ref 0–100)

## 2014-02-07 LAB — PROTIME-INR
INR: 1.02 (ref 0.00–1.49)
Prothrombin Time: 13.5 seconds (ref 11.6–15.2)

## 2014-02-07 LAB — I-STAT TROPONIN, ED
TROPONIN I, POC: 0.71 ng/mL — AB (ref 0.00–0.08)
Troponin i, poc: 0.7 ng/mL (ref 0.00–0.08)

## 2014-02-07 LAB — RAPID URINE DRUG SCREEN, HOSP PERFORMED
Amphetamines: NOT DETECTED
Barbiturates: NOT DETECTED
Benzodiazepines: POSITIVE — AB
Cocaine: POSITIVE — AB
OPIATES: NOT DETECTED
Tetrahydrocannabinol: NOT DETECTED

## 2014-02-07 LAB — BRAIN NATRIURETIC PEPTIDE: B NATRIURETIC PEPTIDE 5: 415.2 pg/mL — AB (ref 0.0–100.0)

## 2014-02-07 LAB — APTT: aPTT: 23 seconds — ABNORMAL LOW (ref 24–37)

## 2014-02-07 MED ORDER — ALPRAZOLAM 0.5 MG PO TABS: 0.5000 mg | ORAL_TABLET | Freq: Three times a day (TID) | ORAL | Status: DC | PRN

## 2014-02-07 MED ORDER — FLUTICASONE FUROATE-VILANTEROL 100-25 MCG/INH IN AEPB: 1.0000 | INHALATION_SPRAY | Freq: Two times a day (BID) | RESPIRATORY_TRACT | Status: DC

## 2014-02-07 MED ORDER — SODIUM CHLORIDE 0.9 % IV SOLN
INTRAVENOUS | Status: DC
  Administered 2014-02-08 – 2014-02-09 (×2): via INTRAVENOUS

## 2014-02-07 MED ORDER — ACETAMINOPHEN 325 MG PO TABS
650.0000 mg | ORAL_TABLET | Freq: Four times a day (QID) | ORAL | Status: DC | PRN
  Administered 2014-02-09 – 2014-02-14 (×9): 650 mg via ORAL
  Filled 2014-02-07 (×10): qty 2

## 2014-02-07 MED ORDER — VANCOMYCIN HCL 10 G IV SOLR
2000.0000 mg | INTRAVENOUS | Status: DC
  Administered 2014-02-09: 2000 mg via INTRAVENOUS
  Filled 2014-02-07 (×2): qty 2000

## 2014-02-07 MED ORDER — ADULT MULTIVITAMIN W/MINERALS CH
1.0000 | ORAL_TABLET | Freq: Every day | ORAL | Status: DC
  Administered 2014-02-08 – 2014-02-15 (×8): 1 via ORAL
  Filled 2014-02-07 (×9): qty 1

## 2014-02-07 MED ORDER — ACETAMINOPHEN 650 MG RE SUPP
650.0000 mg | Freq: Four times a day (QID) | RECTAL | Status: DC | PRN
Start: 2014-02-07 — End: 2014-02-15

## 2014-02-07 MED ORDER — PIPERACILLIN-TAZOBACTAM IN DEX 2-0.25 GM/50ML IV SOLN
2.2500 g | Freq: Once | INTRAVENOUS | Status: AC
  Administered 2014-02-07: 2.25 g via INTRAVENOUS
  Filled 2014-02-07: qty 50

## 2014-02-07 MED ORDER — VANCOMYCIN HCL IN DEXTROSE 1-5 GM/200ML-% IV SOLN
1000.0000 mg | Freq: Once | INTRAVENOUS | Status: AC
  Administered 2014-02-07: 1000 mg via INTRAVENOUS
  Filled 2014-02-07: qty 200

## 2014-02-07 MED ORDER — PIPERACILLIN-TAZOBACTAM 3.375 G IVPB
3.3750 g | Freq: Three times a day (TID) | INTRAVENOUS | Status: DC
  Administered 2014-02-08: 3.375 g via INTRAVENOUS
  Filled 2014-02-07 (×2): qty 50

## 2014-02-07 MED ORDER — HEPARIN SODIUM (PORCINE) 5000 UNIT/ML IJ SOLN
5000.0000 [IU] | Freq: Three times a day (TID) | INTRAMUSCULAR | Status: DC
  Administered 2014-02-07 – 2014-02-09 (×5): 5000 [IU] via SUBCUTANEOUS
  Filled 2014-02-07 (×11): qty 1

## 2014-02-07 MED ORDER — SODIUM CHLORIDE 0.9 % IJ SOLN
3.0000 mL | Freq: Two times a day (BID) | INTRAMUSCULAR | Status: DC
  Administered 2014-02-08 – 2014-02-15 (×12): 3 mL via INTRAVENOUS

## 2014-02-07 MED ORDER — SODIUM CHLORIDE 0.9 % IV BOLUS (SEPSIS)
1000.0000 mL | Freq: Once | INTRAVENOUS | Status: AC
  Administered 2014-02-07: 1000 mL via INTRAVENOUS

## 2014-02-07 MED ORDER — ONDANSETRON HCL 4 MG PO TABS: 4.0000 mg | ORAL_TABLET | Freq: Four times a day (QID) | ORAL | Status: DC | PRN

## 2014-02-07 MED ORDER — ONDANSETRON HCL 4 MG/2ML IJ SOLN: 4.0000 mg | Freq: Four times a day (QID) | INTRAMUSCULAR | Status: DC | PRN

## 2014-02-07 MED ORDER — ASPIRIN EC 325 MG PO TBEC
325.0000 mg | DELAYED_RELEASE_TABLET | Freq: Every day | ORAL | Status: DC
  Administered 2014-02-07: 325 mg via ORAL
  Filled 2014-02-07 (×2): qty 1

## 2014-02-07 MED ORDER — OXYCODONE HCL 5 MG PO TABS
10.0000 mg | ORAL_TABLET | Freq: Four times a day (QID) | ORAL | Status: DC | PRN
  Administered 2014-02-07: 10 mg via ORAL
  Filled 2014-02-07: qty 2
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
## 2014-02-07 NOTE — ED Notes (Signed)
Denies chest pain. Admission physician aware of patient reporting chest pain after being told he was told he was staying. When he did have the chest pain, he describes it as dull pain all over.

## 2014-02-07 NOTE — ED Notes (Signed)
Phlebotomist called. They are on the way to draw lavender
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
## 2014-02-07 NOTE — ED Notes (Signed)
Dean Bowers, NT provided patient a [place] sandwich and diet coke.

## 2014-02-07 NOTE — ED Notes (Signed)
Patient reported he was experiencing chest pain to [place], [place]. Went to bedside, pt denied having chest pain. Pt's blood pressure was low while NT at bedside but BP increased when at bedside. Informed to call out if he experienced chest pain. Pt called out again complaining of chest pain. NT went to bedside, Pryor Ochoa and provider went to bedside.
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
## 2014-02-07 NOTE — ED Notes (Signed)
Second Troponin ran, results .7, MD Kenton Kingfisher notified

## 2014-02-07 NOTE — ED Notes (Signed)
Patient I stat Troponin was 0.71, sample is being re-ran to verify. PA Rob made aware of initial result and will notify of second result

## 2014-02-07 NOTE — ED Notes (Signed)
Pt from home reports that he is having dizziness, urinary retention x2 days. Pt adds that he has been falling down also bit has no injury. Pt has prosthetic leg that cannot be placed d/t swelling. Pt is A&O and in NAD. Pt denies CP, SOB, N/V.

## 2014-02-07 NOTE — ED Notes (Signed)
Bed: [facility] Expected date: 02/07/14 Expected time: 4:03 PM Means of arrival: Ambulance Comments: Difficult urinating, obese 400#

## 2014-02-08 ENCOUNTER — Inpatient Hospital Stay (HOSPITAL_COMMUNITY): Payer: 59

## 2014-02-08 ENCOUNTER — Inpatient Hospital Stay (HOSPITAL_COMMUNITY): Payer: 59 | Admitting: Anesthesiology

## 2014-02-08 ENCOUNTER — Encounter (HOSPITAL_COMMUNITY): Payer: Self-pay | Admitting: *Deleted

## 2014-02-08 DIAGNOSIS — F1910 Other psychoactive substance abuse, uncomplicated: Secondary | ICD-10-CM

## 2014-02-08 DIAGNOSIS — N179 Acute kidney failure, unspecified: Secondary | ICD-10-CM

## 2014-02-08 DIAGNOSIS — L03116 Cellulitis of left lower limb: Secondary | ICD-10-CM

## 2014-02-08 DIAGNOSIS — M79609 Pain in unspecified limb: Secondary | ICD-10-CM

## 2014-02-08 DIAGNOSIS — J9602 Acute respiratory failure with hypercapnia: Secondary | ICD-10-CM | POA: Diagnosis not present

## 2014-02-08 DIAGNOSIS — A419 Sepsis, unspecified organism: Principal | ICD-10-CM

## 2014-02-08 DIAGNOSIS — R609 Edema, unspecified: Secondary | ICD-10-CM

## 2014-02-08 LAB — BLOOD GAS, ARTERIAL
ACID-BASE DEFICIT: 2 mmol/L (ref 0.0–2.0)
ACID-BASE EXCESS: 1 mmol/L (ref 0.0–2.0)
Acid-base deficit: 1 mmol/L (ref 0.0–2.0)
BICARBONATE: 30 meq/L — AB (ref 20.0–24.0)
Bicarbonate: 27.6 mEq/L — ABNORMAL HIGH (ref 20.0–24.0)
Bicarbonate: 26.8 mEq/L — ABNORMAL HIGH (ref 20.0–24.0)
Drawn by: 422461
Drawn by: 235321
Drawn by: 39866
FIO2: 0.55 %
FIO2: 1 %
FIO2: 0.4 %
O2 CONTENT: 14 L/min
O2 SAT: 94 %
O2 Saturation: 90.9 %
O2 Saturation: 99.6 %
PATIENT TEMPERATURE: 98.3
PCO2 ART: 71.3 mmHg — AB (ref 35.0–45.0)
PEEP: 5 cmH2O
PEEP: 5 cmH2O
PH ART: 7.097 — AB (ref 7.350–7.450)
Patient temperature: 98.4
Patient temperature: 98.6
RATE: 18 resp/min
RATE: 18 resp/min
TCO2: 29.8 mmol/L (ref 0–100)
TCO2: 26.5 mmol/L (ref 0–100)
TCO2: 28.5 mmol/L (ref 0–100)
VT: 580 mL
VT: 580 mL
pCO2 arterial: 102 mmHg (ref 35.0–45.0)
pCO2 arterial: 56.2 mmHg — ABNORMAL HIGH (ref 35.0–45.0)
pH, Arterial: 7.212 — ABNORMAL LOW (ref 7.350–7.450)
pH, Arterial: 7.299 — ABNORMAL LOW (ref 7.350–7.450)
pO2, Arterial: 79.7 mmHg — ABNORMAL LOW (ref 80.0–100.0)
pO2, Arterial: 295 mmHg — ABNORMAL HIGH (ref 80.0–100.0)
pO2, Arterial: 83.5 mmHg (ref 80.0–100.0)

## 2014-02-08 LAB — URINE MICROSCOPIC-ADD ON

## 2014-02-08 LAB — TROPONIN I
TROPONIN I: 1.42 ng/mL — AB (ref <0.031)
Troponin I: 1.2 ng/mL (ref <0.031)
Troponin I: 1.11 ng/mL (ref <0.031)

## 2014-02-08 LAB — CBC
HCT: 36.8 % — ABNORMAL LOW (ref 39.0–52.0)
Hemoglobin: 9.9 g/dL — ABNORMAL LOW (ref 13.0–17.0)
MCH: 21 pg — AB (ref 26.0–34.0)
MCHC: 26.9 g/dL — ABNORMAL LOW (ref 30.0–36.0)
MCV: 78.1 fL (ref 78.0–100.0)
PLATELETS: 282 10*3/uL (ref 150–400)
RBC: 4.71 MIL/uL (ref 4.22–5.81)
RDW: 18 % — AB (ref 11.5–15.5)
WBC: 13.9 10*3/uL — ABNORMAL HIGH (ref 4.0–10.5)

## 2014-02-08 LAB — COMPREHENSIVE METABOLIC PANEL
ALK PHOS: 61 U/L (ref 39–117)
ALT: 365 U/L — AB (ref 0–53)
AST: 243 U/L — ABNORMAL HIGH (ref 0–37)
Albumin: 3 g/dL — ABNORMAL LOW (ref 3.5–5.2)
Anion gap: 12 (ref 5–15)
BUN: 53 mg/dL — AB (ref 6–23)
CO2: 30 mmol/L (ref 19–32)
Calcium: 7.6 mg/dL — ABNORMAL LOW (ref 8.4–10.5)
Chloride: 93 mEq/L — ABNORMAL LOW (ref 96–112)
Creatinine, Ser: 6.17 mg/dL — ABNORMAL HIGH (ref 0.50–1.35)
GFR calc non Af Amer: 10 mL/min — ABNORMAL LOW (ref >90)
GFR, EST AFRICAN AMERICAN: 12 mL/min — AB (ref >90)
GLUCOSE: 137 mg/dL — AB (ref 70–99)
POTASSIUM: 5 mmol/L (ref 3.5–5.1)
SODIUM: 135 mmol/L (ref 135–145)
Total Bilirubin: 0.8 mg/dL (ref 0.3–1.2)
Total Protein: 6.7 g/dL (ref 6.0–8.3)

## 2014-02-08 LAB — GLUCOSE, CAPILLARY
Glucose-Capillary: 127 mg/dL — ABNORMAL HIGH (ref 70–99)
Glucose-Capillary: 90 mg/dL (ref 70–99)
Glucose-Capillary: 84 mg/dL (ref 70–99)
Glucose-Capillary: 90 mg/dL (ref 70–99)

## 2014-02-08 LAB — BASIC METABOLIC PANEL
Anion gap: 10 (ref 5–15)
BUN: 59 mg/dL — ABNORMAL HIGH (ref 6–23)
CO2: 30 mmol/L (ref 19–32)
CREATININE: 6.25 mg/dL — AB (ref 0.50–1.35)
Calcium: 7.8 mg/dL — ABNORMAL LOW (ref 8.4–10.5)
Chloride: 96 mEq/L (ref 96–112)
GFR, EST AFRICAN AMERICAN: 11 mL/min — AB (ref >90)
GFR, EST NON AFRICAN AMERICAN: 10 mL/min — AB (ref >90)
Glucose, Bld: 107 mg/dL — ABNORMAL HIGH (ref 70–99)
POTASSIUM: 4 mmol/L (ref 3.5–5.1)
Sodium: 136 mmol/L (ref 135–145)

## 2014-02-08 LAB — URINALYSIS, ROUTINE W REFLEX MICROSCOPIC
Bilirubin Urine: NEGATIVE
Glucose, UA: NEGATIVE mg/dL
KETONES UR: NEGATIVE mg/dL
Nitrite: NEGATIVE
PROTEIN: 30 mg/dL — AB
Specific Gravity, Urine: 1.009 (ref 1.005–1.030)
Urobilinogen, UA: 0.2 mg/dL (ref 0.0–1.0)
pH: 5.5 (ref 5.0–8.0)

## 2014-02-08 LAB — SODIUM, URINE, RANDOM: Sodium, Ur: 81 mmol/L

## 2014-02-08 LAB — HEPATITIS C ANTIBODY: HCV Ab: NEGATIVE

## 2014-02-08 LAB — HIV ANTIBODY (ROUTINE TESTING W REFLEX): HIV 1&2 Ab, 4th Generation: NONREACTIVE

## 2014-02-08 LAB — LACTIC ACID, PLASMA
LACTIC ACID, VENOUS: 0.8 mmol/L (ref 0.5–2.2)
Lactic Acid, Venous: 1.3 mmol/L (ref 0.5–2.2)

## 2014-02-08 LAB — POCT I-STAT TROPONIN I: Troponin i, poc: 0.71 ng/mL (ref 0.00–0.08)

## 2014-02-08 LAB — PROCALCITONIN: Procalcitonin: 0.26 ng/mL

## 2014-02-08 LAB — TRIGLYCERIDES: TRIGLYCERIDES: 166 mg/dL — AB (ref <150)

## 2014-02-08 LAB — CREATININE, URINE, RANDOM: Creatinine, Urine: 52.64 mg/dL

## 2014-02-08 LAB — HEPATITIS B SURFACE ANTIGEN: HEP B S AG: NEGATIVE

## 2014-02-08 LAB — CK: Total CK: 114 U/L (ref 7–232)

## 2014-02-08 LAB — HEMOGLOBIN A1C
Hgb A1c MFr Bld: 6.5 % — ABNORMAL HIGH (ref <5.7)
MEAN PLASMA GLUCOSE: 140 mg/dL — AB (ref <117)

## 2014-02-08 LAB — MRSA PCR SCREENING: MRSA by PCR: NEGATIVE

## 2014-02-08 LAB — LACTATE DEHYDROGENASE: LDH: 235 U/L (ref 94–250)

## 2014-02-08 MED ORDER — FENTANYL CITRATE 0.05 MG/ML IJ SOLN
100.0000 ug | INTRAMUSCULAR | Status: DC | PRN
  Administered 2014-02-08 – 2014-02-11 (×5): 100 ug via INTRAVENOUS
  Filled 2014-02-08 (×5): qty 2

## 2014-02-08 MED ORDER — NALOXONE HCL 0.4 MG/ML IJ SOLN
0.4000 mg | Freq: Once | INTRAMUSCULAR | Status: AC
  Administered 2014-02-08: 0.4 mg via INTRAVENOUS

## 2014-02-08 MED ORDER — NALOXONE HCL 0.4 MG/ML IJ SOLN
INTRAMUSCULAR | Status: AC
Start: 2014-02-08 — End: 2014-02-08
  Administered 2014-02-08: 0.4 mg
  Filled 2014-02-08: qty 1

## 2014-02-08 MED ORDER — BUDESONIDE 0.25 MG/2ML IN SUSP
0.2500 mg | Freq: Four times a day (QID) | RESPIRATORY_TRACT | Status: DC
  Administered 2014-02-08 – 2014-02-15 (×23): 0.25 mg via RESPIRATORY_TRACT
  Filled 2014-02-08 (×34): qty 2

## 2014-02-08 MED ORDER — CHLORHEXIDINE GLUCONATE 0.12 % MT SOLN
OROMUCOSAL | Status: AC
  Filled 2014-02-08: qty 15

## 2014-02-08 MED ORDER — ROCURONIUM BROMIDE 50 MG/5ML IV SOLN
INTRAVENOUS | Status: AC
  Filled 2014-02-08: qty 2

## 2014-02-08 MED ORDER — PRO-STAT SUGAR FREE PO LIQD
60.0000 mL | Freq: Three times a day (TID) | ORAL | Status: DC
  Administered 2014-02-08 – 2014-02-09 (×5): 60 mL
  Administered 2014-02-09 (×2): 30 mL
  Administered 2014-02-10: 60 mL
  Filled 2014-02-08 (×13): qty 60

## 2014-02-08 MED ORDER — SODIUM CHLORIDE 0.9 % IV SOLN
25.0000 ug/h | INTRAVENOUS | Status: DC
  Administered 2014-02-08: 150 ug/h via INTRAVENOUS
  Administered 2014-02-09: 300 ug/h via INTRAVENOUS
  Administered 2014-02-09: 250 ug/h via INTRAVENOUS
  Administered 2014-02-10: 300 ug/h via INTRAVENOUS
  Filled 2014-02-08 (×6): qty 50

## 2014-02-08 MED ORDER — IPRATROPIUM-ALBUTEROL 0.5-2.5 (3) MG/3ML IN SOLN
3.0000 mL | Freq: Four times a day (QID) | RESPIRATORY_TRACT | Status: DC
  Administered 2014-02-08 – 2014-02-15 (×25): 3 mL via RESPIRATORY_TRACT
  Filled 2014-02-08 (×28): qty 3

## 2014-02-08 MED ORDER — ALBUTEROL SULFATE (2.5 MG/3ML) 0.083% IN NEBU
2.5000 mg | INHALATION_SOLUTION | RESPIRATORY_TRACT | Status: DC | PRN
  Administered 2014-02-09: 2.5 mg via RESPIRATORY_TRACT
  Filled 2014-02-08: qty 3

## 2014-02-08 MED ORDER — CHLORHEXIDINE GLUCONATE 0.12 % MT SOLN
15.0000 mL | Freq: Two times a day (BID) | OROMUCOSAL | Status: DC
  Administered 2014-02-08 – 2014-02-15 (×14): 15 mL via OROMUCOSAL
  Filled 2014-02-08 (×14): qty 15

## 2014-02-08 MED ORDER — VITAL HIGH PROTEIN PO LIQD
1000.0000 mL | ORAL | Status: DC
  Administered 2014-02-09: 07:00:00
  Administered 2014-02-09 (×2): 1000 mL
  Administered 2014-02-10: 11:00:00
  Filled 2014-02-08 (×5): qty 1000

## 2014-02-08 MED ORDER — PROPOFOL 10 MG/ML IV BOLUS
INTRAVENOUS | Status: AC
  Filled 2014-02-08: qty 20

## 2014-02-08 MED ORDER — NALOXONE HCL 0.4 MG/ML IJ SOLN
0.4000 mg | INTRAMUSCULAR | Status: DC | PRN
  Filled 2014-02-08: qty 1

## 2014-02-08 MED ORDER — LIDOCAINE HCL 1 % IJ SOLN
INTRAMUSCULAR | Status: AC
  Filled 2014-02-08: qty 20

## 2014-02-08 MED ORDER — NALOXONE HCL 0.4 MG/ML IJ SOLN: 0.4000 mg | Freq: Once | INTRAMUSCULAR | Status: AC

## 2014-02-08 MED ORDER — PROPOFOL 10 MG/ML IV EMUL
0.0000 ug/kg/min | INTRAVENOUS | Status: DC
  Administered 2014-02-08: 35 ug/kg/min via INTRAVENOUS
  Administered 2014-02-08 (×2): 50 ug/kg/min via INTRAVENOUS
  Administered 2014-02-08: 40 ug/kg/min via INTRAVENOUS
  Administered 2014-02-08: 50 ug/kg/min via INTRAVENOUS
  Administered 2014-02-08: 30 ug/kg/min via INTRAVENOUS
  Administered 2014-02-08: 40 ug/kg/min via INTRAVENOUS
  Administered 2014-02-08: 35 ug/kg/min via INTRAVENOUS
  Filled 2014-02-08 (×9): qty 100

## 2014-02-08 MED ORDER — INSULIN ASPART 100 UNIT/ML ~~LOC~~ SOLN
0.0000 [IU] | SUBCUTANEOUS | Status: DC
  Administered 2014-02-09 (×3): 3 [IU] via SUBCUTANEOUS
  Administered 2014-02-09 – 2014-02-10 (×3): 4 [IU] via SUBCUTANEOUS
  Administered 2014-02-10 – 2014-02-11 (×2): 3 [IU] via SUBCUTANEOUS
  Administered 2014-02-11: 4 [IU] via SUBCUTANEOUS

## 2014-02-08 MED ORDER — LIDOCAINE HCL (CARDIAC) 20 MG/ML IV SOLN
INTRAVENOUS | Status: AC
  Filled 2014-02-08: qty 5

## 2014-02-08 MED ORDER — SUCCINYLCHOLINE CHLORIDE 20 MG/ML IJ SOLN
INTRAMUSCULAR | Status: AC
  Filled 2014-02-08: qty 1

## 2014-02-08 MED ORDER — ETOMIDATE 2 MG/ML IV SOLN
INTRAVENOUS | Status: AC
  Filled 2014-02-08: qty 20

## 2014-02-08 MED ORDER — PANTOPRAZOLE SODIUM 40 MG IV SOLR
40.0000 mg | Freq: Every day | INTRAVENOUS | Status: DC
  Administered 2014-02-08 – 2014-02-09 (×2): 40 mg via INTRAVENOUS
  Filled 2014-02-08 (×3): qty 40

## 2014-02-08 MED ORDER — NOREPINEPHRINE BITARTRATE 1 MG/ML IV SOLN
0.0000 ug/min | INTRAVENOUS | Status: DC
  Administered 2014-02-08: 7 ug/min via INTRAVENOUS
  Administered 2014-02-08: 10 ug/min via INTRAVENOUS
  Administered 2014-02-09: 20 ug/min via INTRAVENOUS
  Administered 2014-02-09: 15 ug/min via INTRAVENOUS
  Filled 2014-02-08 (×5): qty 4

## 2014-02-08 MED ORDER — SUCCINYLCHOLINE CHLORIDE 20 MG/ML IJ SOLN
INTRAMUSCULAR | Status: DC | PRN
  Administered 2014-02-08: 200 mg via INTRAVENOUS
  Administered 2014-03-26: 180 mg via INTRAVENOUS

## 2014-02-08 MED ORDER — PIPERACILLIN-TAZOBACTAM IN DEX 2-0.25 GM/50ML IV SOLN
2.2500 g | Freq: Three times a day (TID) | INTRAVENOUS | Status: DC
  Administered 2014-02-08 – 2014-02-10 (×5): 2.25 g via INTRAVENOUS
  Filled 2014-02-08 (×8): qty 50

## 2014-02-08 MED ORDER — PHENYLEPHRINE HCL 10 MG/ML IJ SOLN
INTRAMUSCULAR | Status: DC | PRN
  Administered 2014-02-08 (×3): 80 ug via INTRAVENOUS
  Administered 2014-03-26: 40 ug via INTRAVENOUS
  Administered 2014-03-26 (×2): 80 ug via INTRAVENOUS

## 2014-02-08 MED ORDER — PROPOFOL 10 MG/ML IV BOLUS
INTRAVENOUS | Status: DC | PRN
  Administered 2014-02-08: 200 mg via INTRAVENOUS

## 2014-02-08 MED ORDER — CETYLPYRIDINIUM CHLORIDE 0.05 % MT LIQD
7.0000 mL | Freq: Four times a day (QID) | OROMUCOSAL | Status: DC
  Administered 2014-02-08 – 2014-02-15 (×19): 7 mL via OROMUCOSAL

## 2014-02-08 MED ORDER — NALOXONE HCL 0.4 MG/ML IJ SOLN: 0.4000 mg | INTRAMUSCULAR | Status: DC | PRN

## 2014-02-08 MED ORDER — DEXTROSE 5 % IV SOLN
160.0000 mg | Freq: Four times a day (QID) | INTRAVENOUS | Status: AC
  Administered 2014-02-08 – 2014-02-09 (×2): 160 mg via INTRAVENOUS
  Filled 2014-02-08 (×2): qty 16

## 2014-02-08 MED ORDER — BUDESONIDE 0.25 MG/2ML IN SUSP: 0.2500 mg | Freq: Four times a day (QID) | RESPIRATORY_TRACT | Status: DC

## 2014-02-08 MED ORDER — VITAL HIGH PROTEIN PO LIQD
1000.0000 mL | ORAL | Status: DC
  Filled 2014-02-08: qty 1000

## 2014-02-08 NOTE — Anesthesia Preprocedure Evaluation (Signed)
Anesthesia Evaluation  Patient identified by MRN, date of birth, ID band  Reviewed: Unable to perform ROS - Chart review onlyPreop documentation limited or incomplete due to emergent nature of procedure.  Airway        Dental   Pulmonary Current Smoker,          Cardiovascular hypertension,     Neuro/Psych    GI/Hepatic   Endo/Other    Renal/GU      Musculoskeletal   Abdominal   Peds  Hematology   Anesthesia Other Findings   Reproductive/Obstetrics                             Anesthesia Physical Anesthesia Plan  ASA: IV and emergent  Anesthesia Plan:    Post-op Pain Management:    Induction:   Airway Management Planned: Oral ETT  Additional Equipment:   Intra-op Plan:   Post-operative Plan:   Informed Consent:   Plan Discussed with:   Anesthesia Plan Comments:         Anesthesia Quick Evaluation

## 2014-02-08 NOTE — Consult Note (Signed)
I have spoken with Dr. Marcelino Scot and He is planning to perform an I&D of his BKA stump first thing tomorrow morning on his elective schedule.   Patient is intubated and stable right now per critical care, should this change please call me back   Edmonia Lynch, D  Cell: [phone]

## 2014-02-08 NOTE — Progress Notes (Signed)
Pt presented to unit with bilateral soft restraints, order placed while at [facility] ICU, will continue to monitor. Restraints were properly applied upon arrival no adjustments needed.   Babs Bertin RN

## 2014-02-08 NOTE — Progress Notes (Addendum)
Called by RN to see patient.  Troponin 1.42 Pt was more somnolent with oxygen sat into mid and upper 80s on venturi mask.  Narcan 0.4mg  IV x 1 with pt waking up and aggitated.  Denies any cp, sob, N/V  ABG 7.09/102/79 on 55% mask  [id number]/42 CV-RRR Lung--scattered wheeze Abd--soft/NT+BS  Acute respiratory failure--likely due to hypoventilation from oxycodone and OSA CXR--pulm interstitial prominence, but poor inspiration and hypoventilation Repeat EKG--lateral T-wave inversions look better than EKG earlier this eve.  Current EKG at 0127--Twave changes nonspecific  D/c oxycodone Pt weaned to [place] 6L @96 -97%, no distress once he was more alert  Spoke with Cardiology Fellow Wanita Chamberlain hold off on anticoagulation.  EKG and clinical hx suggest demand ischemia with sepsis picture and AKI.  Trend troponins--if goes up again, may consider starting IV heparin.   Repeat EKG in am. I asked for formal am consult from cardiology.  Dr. Claiborne Billings requested we also call for  the consult in am. Transferred pt to step down Slow down IVF to 50cc/hr; difficult to discern fluid status due to pt's body habitus  DTat

## 2014-02-08 NOTE — Progress Notes (Signed)
[facility] Progress Note Patient Name: AVEER BARTOW DOB: [birth year] MRN: [id number]   Date of Service  02/08/2014  HPI/Events of Note  Renal failure, K 5, non-oliguric in settting of septic shock  eICU Interventions  Nephrology consult     Intervention Category Intermediate Interventions: Diagnostic test evaluation  MCQUAID, DOUGLAS 02/08/2014, 6:03 PM

## 2014-02-08 NOTE — Anesthesia Procedure Notes (Signed)
Procedure Name: Intubation Date/Time: 02/08/2014 4:15 AM Performed by: Nickie Retort Pre-anesthesia Checklist: Patient identified, Emergency Drugs available, Suction available, Patient being monitored and Timeout performed Patient Re-evaluated:Patient Re-evaluated prior to inductionOxygen Delivery Method: Ambu bag Preoxygenation: Pre-oxygenation with 100% oxygen Intubation Type: IV induction, Rapid sequence and Cricoid Pressure applied Ventilation: Mask ventilation without difficulty Laryngoscope Size: Mac and 3 Grade View: Grade I Tube type: Oral Airway Equipment and Method: Video-laryngoscopy (Glidescope) Placement Confirmation: ETT inserted through vocal cords under direct vision,  positive ETCO2,  CO2 detector and breath sounds checked- equal and bilateral Secured at: 23 cm Dental Injury: Teeth and Oropharynx as per pre-operative assessment
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
## 2014-02-08 NOTE — Progress Notes (Signed)
RN paged secondary to pt with increases somnolence. Upon arrival, pt is somnelent, opens eyes, alert only to self, and unable to follow commands.  HR 82 RR 11-117/72. Noted that pt received oxycodone in ED.  Gave Narcan 0.4mg  IV X2, with no change in mental status. ABG taken earlier with hypercapnic respiratory failure (7.09, 102, 94 on 6L [place])  .  Placed pt on Bipap,  consulted CCM, which will follow.   [facility] [phone]

## 2014-02-08 NOTE — Progress Notes (Signed)
VASCULAR LAB PRELIMINARY  PRELIMINARY  PRELIMINARY  PRELIMINARY  Bilateral lower extremity venous duplex  completed.    Preliminary report:  Bilateral:  No obvious evidence of DVT, superficial thrombosis, or Baker's Cyst.  Technically limited by body habitus.    Saya Mccoll, RVT 02/08/2014, 9:40 AM

## 2014-02-08 NOTE — Progress Notes (Signed)
Report called to Jaconita in ICU/Stepdown.

## 2014-02-08 NOTE — Progress Notes (Signed)
Rapid response called. Orders received for stat CXR and ABG.

## 2014-02-08 NOTE — Progress Notes (Signed)
[facility] Progress Note Patient Name: Dean Bowers DOB: [birth year] MRN: [id number]   Date of Service  02/08/2014  HPI/Events of Note  Results of CT reviewed with Ortho, they request transfer to [facility] for surgery  eICU Interventions  Transfer orders written     Intervention Category Major Interventions: Infection - evaluation and management Intermediate Interventions: Diagnostic test evaluation  Toye Rouillard 02/08/2014, 5:37 PM
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
## 2014-02-08 NOTE — Progress Notes (Signed)
RN called patient's spouse to give update and room number. No questions or concerns at the time of the call.
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
## 2014-02-08 NOTE — Consult Note (Signed)
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 hydronephrosis visualized.  Abdominal aorta: 2.4 cm maximal diameter.  Other findings: None.  IMPRESSION: No acute abnormalities. 7 mm stone in the mid left kidney. No hydronephrosis.   Electronically Signed   By: Rozetta Nunnery M.D.   On: 02/08/2014 09:39   Portable Chest Xray  02/08/2014   CLINICAL DATA:  Intubation.  Decreased blood pressure.  EXAM: PORTABLE CHEST - 1 VIEW  COMPARISON:  02/08/2014  FINDINGS: Endotracheal tube tip measures 5.4 cm above the carinal. Enteric tube tip is below the left hemidiaphragm off the field of view. Cardiac enlargement. Left central venous catheter with tip over the central mediastinum, likely in the brachiocephalic vein. Pulmonary vascular  congestion. No edema or consolidation. No blunting of costophrenic angles. No pneumothorax.  IMPRESSION: Right  central venous catheter tip overlies the brachiocephalic vein. Endotracheal tube tip measures 5.4 cm above the carina. Persistent cardiac enlargement and pulmonary vascular congestion.   Electronically Signed   By: Lucienne Capers M.D.   On: 02/08/2014 05:10   Dg Chest Port 1 View  02/08/2014   CLINICAL DATA:  Elevated troponin I level. Unresponsive. Initial encounter.  EXAM: PORTABLE CHEST - 1 VIEW  COMPARISON:  Chest radiograph from 02/07/2014  FINDINGS: The lungs are mildly hypoexpanded. Vascular congestion is noted, with increased interstitial markings, raising concern for mild pulmonary edema. No definite pleural effusion or pneumothorax is seen.  The cardiomediastinal silhouette is enlarged. No acute osseous abnormalities are identified.  IMPRESSION: Lungs mildly hypoexpanded. Vascular congestion and cardiomegaly, with increased interstitial markings, raising concern for mild new pulmonary edema.   Electronically Signed   By: Garald Balding M.D.   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  EXAM: RIGHT KNEE - COMPLETE 4+ VIEW  COMPARISON:  05/26/2012.  FINDINGS: Gas within the soft tissues of the anterolateral aspect of the stump, extending deep to the level of the tibia. Mild irregularity involving the distal tibia. Osseous demineralization.  IMPRESSION: Gas within the soft tissues of the anterolateral aspect of the stump, extending deep to the level of the tibia where there may be early changes of osteomyelitis.   Electronically Signed   By: Evangeline Dakin M.D.   On: 02/07/2014 18:06   Dg Abd Portable 1v  02/08/2014   CLINICAL DATA:  Gastric tube placement.  EXAM: PORTABLE ABDOMEN - 1 VIEW  COMPARISON:  None.  FINDINGS: AP portable 5 and low upper abdominal radiographs demonstrate nasogastric  tube with its tip along the greater curvature of the stomach. The heart is enlarged. The patient is intubated. BILATERAL pleural effusions and basilar atelectasis are suspected.  IMPRESSION: Nasogastric tube tip lies in the stomach along the greater curvature.   Electronically Signed   By: Rolla Flatten M.D.   On: 02/08/2014 08:03   Ct Extrem Lower Wo Cm Bil  02/08/2014   CLINICAL DATA:  Draining wound from a right BKA stump. Diffuse cellulitis and both lower extremities.  EXAM: CT OF THE LOWER BILATERAL EXTREMITY WITHOUT CONTRAST  TECHNIQUE: Multidetector CT imaging of the bilateral lower extremities was performed according to the standard protocol.  COMPARISON:  Radiographs 02/07/2014  FINDINGS: There is diffuse subcutaneous soft tissue swelling/ edema and skin thickening involving both lower extremities suggesting cellulitis. No definite findings for myofasciitis. There is an intra medullary rod in the right femur with reactive changes but no definite findings for osteomyelitis. There is an open wound at the right BKA stump with fluid and air noted in the subcutaneous fat surrounding the bony structures. This is consistent with an abscess. I do not see any definite destructive bony changes but the air and fluid both right to the bone and osteomyelitis is likely.  The left lower extremity does not demonstrate any definite changes of septic arthritis or osteomyelitis.  IMPRESSION: Bilateral lower extremity cellulitis.  No findings for myofasciitis.  Fluid and air surrounding the right BKA stump consistent with an abscess. No definite destructive bony changes but osteomyelitis is likely.   Electronically Signed   By: Kalman Jewels M.D.   On: 02/08/2014 12:54    ROS Blood pressure 97/78, pulse 88, temperature 98.3 F (36.8 C), temperature source Oral, resp. rate 18,  height '5\' 10"'$  (1.778 m), weight 192 kg (423 lb 4.5 oz), SpO2 100 %. Physical Exam Physical Examination: General appearance - entub, not  responsive Mental status - as above, flaccid Eyes - pinpoint pupils, fundi without hemorr Mouth - mucous membranes moist, pharynx normal without lesions Neck - adenopathy noted PCL Lymphatics - posterior cervical nodes Chest - wheezing noted bilat, rales noted bibasilar, decreased air entry noted bilat, rhonchi noted bilat Heart - normal rate and regular rhythm Abdomen - very obese, no bs, soft, liver down. Skin very reddened, old pocking Neurological - no responsive Musculoskeletal - R bka with much distortion of stump , reddened, swollen Extremities - as above, L calf reddened, and scar at L ankle, many tattoos 4+ edema Skin - as above  Assessment/Plan: 1 AKI 2 Proteinuria ?NS 3 Cellulitis 4 Sepsis syndrome 5 Severe volume xs. 6 Pyuria Clearly has hemodynamic reason for AKI with low Bp in setting of ACEI with infx causing, either cellulitis/abscess or urinary origin.  ? Infected hardware.  Now severe vol xs, nonoliguric, not acidemic and K ok.  ? If has underlying NS contributing to anasarca, and infection.   But ? If proteinuria/anemia secondary to chronic infection?    Now need to deal with AKI with vol and hemodynamic issues and then eval poss NS.  Also ? Component of rhabdo.   7 morbid obesity 8 Polysubstance abuse 10 Hx HTN 11,  ^ lipids 12 ? Chronic UTIs, need Hx of Urology  13  Pos trop ? Cocaine, vs underlying CAD P AB, BP support, vent, CK. LDH,  C3 and 4, ASO,  Lasix to see if will respond , if not improving solute, or worsening O2,acid/base will use CRRT  Aquil Duhe L 02/08/2014, 7:42 PM

## 2014-02-08 NOTE — Progress Notes (Signed)
Utilization review completed.  

## 2014-02-08 NOTE — Procedures (Signed)
Central Venous Catheter Insertion Procedure Note Dean Bowers [id number] 31-Oct-1970   Procedure: Insertion of Central Venous Catheter Indications: Assessment of intravascular volume and Drug and/or fluid administration  Procedure Details Consent: Unable to obtain consent because of emergent medical necessity. Time Out: Verified patient identification, verified procedure, site/side was marked, verified correct patient position, special equipment/implants available, medications/allergies/relevent history reviewed, required imaging and test results available.  Performed  Maximum sterile technique was used including antiseptics, cap, gloves, gown, hand hygiene, mask and sheet. Skin prep: Chlorhexidine; local anesthetic administered A antimicrobial bonded/coated triple lumen catheter was placed in the left internal jugular vein using the Seldinger technique.  Evaluation Blood flow good Complications: No apparent complications Patient did tolerate procedure well. Chest X-ray ordered to verify placement.  CXR: pending.  Performed under direct MD supervision.  Performed using ultrasound guidance.  Wire visualized in vessel under ultrasound.    Dean Bowers 02/08/2014, 5:03 AM  Baltazar Apo, MD, PhD 02/08/2014, 8:47 AM [facility] [phone] or if no answer [phone]

## 2014-02-08 NOTE — Progress Notes (Signed)
Patient unresponsive to stimuli and unable to maintain oxygen saturation on 50% VM. Trop I 1.42. Page sent to Dr. Carles Collet.

## 2014-02-08 NOTE — Progress Notes (Signed)
CRITICAL VALUE ALERT  Critical value received:  Trop I 1.42  Date of notification:  02/08/13  Time of notification:  6599  Critical value read back:Yes.    Nurse who received alert:  F. Milagros Loll, RN  MD notified (1st page):  D. Tat  Time of first page:  00:52  MD notified (2nd page):  Time of second page:  Responding MD:  D. Tat  Time MD responded:  01:00

## 2014-02-08 NOTE — Progress Notes (Signed)
2 attempts made for arterial line placement. Unable to thread catheter. Insufficient arterial sample received for arterial blood gas analysis x 2. CRNA paged and agreed to attempts line placement. RT to draw ABG from arterial line post successful insertion. RN aware of difficulties.

## 2014-02-08 NOTE — Anesthesia Preprocedure Evaluation (Addendum)
Anesthesia Evaluation  Patient identified by MRN, date of birth, ID band Patient awake    Reviewed: Allergy & Precautions, NPO status , Patient's Chart, lab work & pertinent test results, reviewed documented beta blocker date and time , Unable to perform ROS - Chart review only  Airway Mallampati: Intubated       Dental   Pulmonary asthma , Current Smoker,  Respisratory failure with CO2 100, intubated 4:36 am 02/08/2014 #8 tube glide scope, PH 7.02 at that time.  Super morbid obesity with hypoventilation syndrome.  Transferred  Afternoon of 1/56from [facility] to [facility] on afternoon of 1/4, remains on ven.  CXR CW hypoinflation and fluid congestion, no effusions t  + rhonchi         Cardiovascular hypertension, Pt. on medications Rhythm:Regular  Troponins elevated this admission,EKG with anterior ST changes, ischemic suggestive.  Septic  On Levophed, Fent for seation   Neuro/Psych negative psych ROS   GI/Hepatic   Endo/Other    Renal/GU ARFRenal diseaseGFR 10, Nephrology saw pm 02/08/2014     Musculoskeletal   Abdominal (+) + obese,   Peds  Hematology   Anesthesia Other Findings   Reproductive/Obstetrics                           Anesthesia Physical Anesthesia Plan  ASA: IV  Anesthesia Plan: General   Post-op Pain Management:    Induction: Intravenous  Airway Management Planned: Oral ETT  Additional Equipment:   Intra-op Plan:   Post-operative Plan: Post-operative intubation/ventilation  Informed Consent: I have reviewed the patients History and Physical, chart, labs and discussed the procedure including the risks, benefits and alternatives for the proposed anesthesia with the patient or authorized representative who has indicated his/her understanding and acceptance.     Plan Discussed with:   Anesthesia Plan Comments: (Super, Super morbidly obese BMI 60 with infected RBKA stump and sepsis.   Acute renal failure, respiratory failure on vent, and elevated troponins and EKG suggestive of ischemia.  Will need to evaluate ABG and CV status prior to transport to OR.)        Anesthesia Quick Evaluation

## 2014-02-08 NOTE — Progress Notes (Signed)
eLink Physician-Brief Progress Note Patient Name: Dean Bowers DOB: 1970-03-05 MRN: [id number]   Date of Service  02/08/2014  HPI/Events of Note  Brought to [facility] for surgery and possible HD. Tentative plan is for surgery in AM. Nephrology note reviewed, plan for lasix, possible CVVHD.  eICU Interventions  Stop propofol, change to fentanyl gtt given shock/levophed.     Intervention Category Major Interventions: Shock - evaluation and management  Lillieann Pavlich 02/08/2014, 10:28 PM

## 2014-02-08 NOTE — Progress Notes (Signed)
[name] Progress Note Patient Name: Dean Bowers DOB: 1970-02-18 MRN: [id number]   Date of Service  02/08/2014  HPI/Events of Note  CT scan reviewed > abscess and osteomyelitis R leg stump BMET reviewed, K 5  eICU Interventions  Consult ortho> Handy (established patient) Repeat BMET 1800     Intervention Category Intermediate Interventions: Diagnostic test evaluation  MCQUAID, DOUGLAS 02/08/2014, 5:26 PM

## 2014-02-09 ENCOUNTER — Inpatient Hospital Stay (HOSPITAL_COMMUNITY): Payer: 59 | Admitting: Anesthesiology

## 2014-02-09 ENCOUNTER — Inpatient Hospital Stay (HOSPITAL_COMMUNITY): Payer: 59

## 2014-02-09 ENCOUNTER — Encounter (HOSPITAL_COMMUNITY)
Admission: EM | Disposition: A | Discharge status: Home Health | Payer: Self-pay | Source: Home / Self Care | Attending: Emergency Medicine

## 2014-02-09 DIAGNOSIS — N179 Acute kidney failure, unspecified: Secondary | ICD-10-CM | POA: Diagnosis present

## 2014-02-09 DIAGNOSIS — L0291 Cutaneous abscess, unspecified: Secondary | ICD-10-CM

## 2014-02-09 DIAGNOSIS — J9600 Acute respiratory failure, unspecified whether with hypoxia or hypercapnia: Secondary | ICD-10-CM

## 2014-02-09 DIAGNOSIS — N170 Acute kidney failure with tubular necrosis: Secondary | ICD-10-CM

## 2014-02-09 HISTORY — PX: I & D EXTREMITY: SHX5045

## 2014-02-09 LAB — COMPREHENSIVE METABOLIC PANEL
ALT: 277 U/L — ABNORMAL HIGH (ref 0–53)
AST: 111 U/L — ABNORMAL HIGH (ref 0–37)
Albumin: 2.8 g/dL — ABNORMAL LOW (ref 3.5–5.2)
Alkaline Phosphatase: 65 U/L (ref 39–117)
Anion gap: 15 (ref 5–15)
BUN: 58 mg/dL — ABNORMAL HIGH (ref 6–23)
CHLORIDE: 93 meq/L — AB (ref 96–112)
CO2: 29 mmol/L (ref 19–32)
CREATININE: 6.13 mg/dL — AB (ref 0.50–1.35)
Calcium: 7.7 mg/dL — ABNORMAL LOW (ref 8.4–10.5)
GFR calc Af Amer: 12 mL/min — ABNORMAL LOW (ref >90)
GFR calc non Af Amer: 10 mL/min — ABNORMAL LOW (ref >90)
GLUCOSE: 125 mg/dL — AB (ref 70–99)
Potassium: 4.2 mmol/L (ref 3.5–5.1)
Sodium: 137 mmol/L (ref 135–145)
Total Bilirubin: 0.8 mg/dL (ref 0.3–1.2)
Total Protein: 6.5 g/dL (ref 6.0–8.3)

## 2014-02-09 LAB — GRAM STAIN

## 2014-02-09 LAB — POCT I-STAT 3, ART BLOOD GAS (G3+)
ACID-BASE EXCESS: 6 mmol/L — AB (ref 0.0–2.0)
Bicarbonate: 33.6 mEq/L — ABNORMAL HIGH (ref 20.0–24.0)
O2 SAT: 95 %
TCO2: 36 mmol/L (ref 0–100)
pCO2 arterial: 66.3 mmHg (ref 35.0–45.0)
pH, Arterial: 7.317 — ABNORMAL LOW (ref 7.350–7.450)
pO2, Arterial: 88 mmHg (ref 80.0–100.0)

## 2014-02-09 LAB — GLUCOSE, CAPILLARY
GLUCOSE-CAPILLARY: 142 mg/dL — AB (ref 70–99)
Glucose-Capillary: 117 mg/dL — ABNORMAL HIGH (ref 70–99)
Glucose-Capillary: 128 mg/dL — ABNORMAL HIGH (ref 70–99)
Glucose-Capillary: 133 mg/dL — ABNORMAL HIGH (ref 70–99)
Glucose-Capillary: 173 mg/dL — ABNORMAL HIGH (ref 70–99)

## 2014-02-09 LAB — VANCOMYCIN, RANDOM: Vancomycin Rm: 11.4 ug/mL

## 2014-02-09 LAB — CBC
HEMATOCRIT: 35.3 % — AB (ref 39.0–52.0)
Hemoglobin: 10.3 g/dL — ABNORMAL LOW (ref 13.0–17.0)
MCH: 21 pg — AB (ref 26.0–34.0)
MCHC: 29.2 g/dL — AB (ref 30.0–36.0)
MCV: 71.9 fL — AB (ref 78.0–100.0)
PLATELETS: 255 10*3/uL (ref 150–400)
RBC: 4.91 MIL/uL (ref 4.22–5.81)
RDW: 17.8 % — ABNORMAL HIGH (ref 11.5–15.5)
WBC: 13.6 10*3/uL — ABNORMAL HIGH (ref 4.0–10.5)

## 2014-02-09 LAB — IRON AND TIBC
IRON: 13 ug/dL — AB (ref 42–165)
Saturation Ratios: 3 % — ABNORMAL LOW (ref 20–55)
TIBC: 397 ug/dL (ref 215–435)
UIBC: 384 ug/dL (ref 125–400)

## 2014-02-09 LAB — TROPONIN I
TROPONIN I: 1.42 ng/mL — AB (ref <0.031)
Troponin I: 1.36 ng/mL (ref <0.031)

## 2014-02-09 LAB — PHOSPHORUS: Phosphorus: 8.7 mg/dL — ABNORMAL HIGH (ref 2.3–4.6)

## 2014-02-09 SURGERY — IRRIGATION AND DEBRIDEMENT EXTREMITY
Anesthesia: General | Laterality: Right

## 2014-02-09 MED ORDER — MIDAZOLAM HCL 2 MG/2ML IJ SOLN
INTRAMUSCULAR | Status: AC
  Filled 2014-02-09: qty 2

## 2014-02-09 MED ORDER — REMIFENTANIL HCL 1 MG IV SOLR
0.0125 ug/kg/min | INTRAVENOUS | Status: DC
  Filled 2014-02-09: qty 2000

## 2014-02-09 MED ORDER — LACTATED RINGERS IV SOLN
INTRAVENOUS | Status: DC | PRN
  Administered 2014-02-09: 12:00:00 via INTRAVENOUS

## 2014-02-09 MED ORDER — FENTANYL CITRATE 0.05 MG/ML IJ SOLN
INTRAMUSCULAR | Status: AC
  Filled 2014-02-09: qty 5

## 2014-02-09 MED ORDER — HYDROCORTISONE NA SUCCINATE PF 100 MG IJ SOLR
50.0000 mg | Freq: Four times a day (QID) | INTRAMUSCULAR | Status: DC
  Administered 2014-02-09 – 2014-02-10 (×4): 50 mg via INTRAVENOUS
  Filled 2014-02-09: qty 1
  Filled 2014-02-09 (×3): qty 2
  Filled 2014-02-09: qty 1
  Filled 2014-02-09: qty 2
  Filled 2014-02-09 (×2): qty 1

## 2014-02-09 MED ORDER — NOREPINEPHRINE BITARTRATE 1 MG/ML IV SOLN
0.0000 ug/min | INTRAVENOUS | Status: DC
  Administered 2014-02-09: 12 ug/min via INTRAVENOUS
  Administered 2014-02-09: 20 ug/min via INTRAVENOUS
  Filled 2014-02-09 (×2): qty 16

## 2014-02-09 MED ORDER — PROPOFOL 10 MG/ML IV BOLUS
INTRAVENOUS | Status: AC
  Filled 2014-02-09: qty 20

## 2014-02-09 MED ORDER — SODIUM CHLORIDE 0.9 % IJ SOLN
INTRAMUSCULAR | Status: AC
  Filled 2014-02-09: qty 10

## 2014-02-09 MED ORDER — ASPIRIN 325 MG PO TABS
325.0000 mg | ORAL_TABLET | Freq: Every day | ORAL | Status: DC
  Administered 2014-02-09 – 2014-02-11 (×3): 325 mg via ORAL
  Filled 2014-02-09 (×3): qty 1

## 2014-02-09 MED ORDER — FUROSEMIDE 10 MG/ML IJ SOLN
120.0000 mg | Freq: Three times a day (TID) | INTRAVENOUS | Status: DC
  Filled 2014-02-09 (×2): qty 12

## 2014-02-09 MED ORDER — VASOPRESSIN 20 UNIT/ML IV SOLN
0.0300 [IU]/min | INTRAVENOUS | Status: DC
  Administered 2014-02-09: 0.03 [IU]/min via INTRAVENOUS
  Filled 2014-02-09 (×2): qty 2

## 2014-02-09 MED ORDER — GLYCOPYRROLATE 0.2 MG/ML IJ SOLN
INTRAMUSCULAR | Status: AC
  Filled 2014-02-09: qty 1

## 2014-02-09 MED ORDER — VECURONIUM BROMIDE 10 MG IV SOLR
INTRAVENOUS | Status: AC
  Filled 2014-02-09: qty 10

## 2014-02-09 MED ORDER — HEPARIN BOLUS VIA INFUSION
2000.0000 [IU] | Freq: Once | INTRAVENOUS | Status: AC
  Administered 2014-02-09: 2000 [IU] via INTRAVENOUS
  Filled 2014-02-09: qty 2000

## 2014-02-09 MED ORDER — HEPARIN (PORCINE) IN NACL 100-0.45 UNIT/ML-% IJ SOLN
2150.0000 [IU]/h | INTRAMUSCULAR | Status: DC
  Administered 2014-02-09: 1800 [IU]/h via INTRAVENOUS
  Administered 2014-02-10: 2150 [IU]/h via INTRAVENOUS
  Filled 2014-02-09 (×5): qty 250

## 2014-02-09 MED ORDER — SODIUM CHLORIDE 0.9 % IV SOLN
2500.0000 ug | INTRAVENOUS | Status: DC | PRN
  Administered 2014-02-09: 25 ug/h via INTRAVENOUS

## 2014-02-09 MED ORDER — ONDANSETRON HCL 4 MG/2ML IJ SOLN
INTRAMUSCULAR | Status: AC
  Filled 2014-02-09: qty 2

## 2014-02-09 MED ORDER — MIDAZOLAM HCL 5 MG/5ML IJ SOLN
INTRAMUSCULAR | Status: DC | PRN
  Administered 2014-02-09: 4 mg via INTRAVENOUS

## 2014-02-09 MED ORDER — SODIUM CHLORIDE 0.9 % IR SOLN
Status: DC | PRN
  Administered 2014-02-09: 1000 mL

## 2014-02-09 SURGICAL SUPPLY — 48 items
BLADE SURG 10 STRL SS (BLADE) ×3 IMPLANT
BNDG COHESIVE 4X5 TAN STRL (GAUZE/BANDAGES/DRESSINGS) ×1 IMPLANT
BNDG GAUZE ELAST 4 BULKY (GAUZE/BANDAGES/DRESSINGS) ×6 IMPLANT
BNDG GAUZE STRTCH 6 (GAUZE/BANDAGES/DRESSINGS) ×9 IMPLANT
BRUSH SCRUB DISP (MISCELLANEOUS) ×6 IMPLANT
COVER SURGICAL LIGHT HANDLE (MISCELLANEOUS) ×6 IMPLANT
DRAIN PENROSE 1/4X12 LTX STRL (WOUND CARE) ×2 IMPLANT
DRAPE U-SHAPE 47X51 STRL (DRAPES) ×3 IMPLANT
DRSG ADAPTIC 3X8 NADH LF (GAUZE/BANDAGES/DRESSINGS) ×3 IMPLANT
ELECT CAUTERY BLADE 6.4 (BLADE) IMPLANT
ELECT REM PT RETURN 9FT ADLT (ELECTROSURGICAL)
ELECTRODE REM PT RTRN 9FT ADLT (ELECTROSURGICAL) IMPLANT
GAUZE SPONGE 4X4 12PLY STRL (GAUZE/BANDAGES/DRESSINGS) ×3 IMPLANT
GLOVE BIO SURGEON STRL SZ7.5 (GLOVE) ×5 IMPLANT
GLOVE BIO SURGEON STRL SZ8 (GLOVE) ×5 IMPLANT
GLOVE BIOGEL PI IND STRL 7.5 (GLOVE) ×1 IMPLANT
GLOVE BIOGEL PI IND STRL 8 (GLOVE) ×1 IMPLANT
GLOVE BIOGEL PI INDICATOR 7.5 (GLOVE) ×2
GLOVE BIOGEL PI INDICATOR 8 (GLOVE) ×2
GOWN STRL REUS W/ TWL LRG LVL3 (GOWN DISPOSABLE) ×2 IMPLANT
GOWN STRL REUS W/ TWL XL LVL3 (GOWN DISPOSABLE) ×1 IMPLANT
GOWN STRL REUS W/TWL LRG LVL3 (GOWN DISPOSABLE) ×6
GOWN STRL REUS W/TWL XL LVL3 (GOWN DISPOSABLE) ×3
HANDPIECE INTERPULSE COAX TIP (DISPOSABLE) ×3
HOVERMATT SINGLE USE (MISCELLANEOUS) ×2 IMPLANT
KIT BASIN OR (CUSTOM PROCEDURE TRAY) ×3 IMPLANT
KIT ROOM TURNOVER OR (KITS) ×3 IMPLANT
MANIFOLD NEPTUNE II (INSTRUMENTS) ×3 IMPLANT
NS IRRIG 1000ML POUR BTL (IV SOLUTION) ×3 IMPLANT
PACK ORTHO EXTREMITY (CUSTOM PROCEDURE TRAY) ×3 IMPLANT
PAD ARMBOARD 7.5X6 YLW CONV (MISCELLANEOUS) ×6 IMPLANT
PADDING CAST COTTON 6X4 STRL (CAST SUPPLIES) ×3 IMPLANT
SET HNDPC FAN SPRY TIP SCT (DISPOSABLE) IMPLANT
SPONGE LAP 18X18 X RAY DECT (DISPOSABLE) ×3 IMPLANT
STOCKINETTE 4X48 STRL (DRAPES) ×2 IMPLANT
STOCKINETTE IMPERVIOUS 9X36 MD (GAUZE/BANDAGES/DRESSINGS) ×3 IMPLANT
STOCKINETTE TUBULAR SYNTH 4IN (CAST SUPPLIES) ×2 IMPLANT
SUT ETHILON 2 0 FS 18 (SUTURE) ×2 IMPLANT
SUT PDS AB 0 CT 36 (SUTURE) ×2 IMPLANT
SUT PDS AB 2-0 CT1 27 (SUTURE) IMPLANT
TOWEL OR 17X24 6PK STRL BLUE (TOWEL DISPOSABLE) ×3 IMPLANT
TOWEL OR 17X26 10 PK STRL BLUE (TOWEL DISPOSABLE) ×6 IMPLANT
TUBE ANAEROBIC SPECIMEN COL (MISCELLANEOUS) ×2 IMPLANT
TUBE CONNECTING 12'X1/4 (SUCTIONS) ×1
TUBE CONNECTING 12X1/4 (SUCTIONS) ×2 IMPLANT
UNDERPAD 30X30 INCONTINENT (UNDERPADS AND DIAPERS) ×3 IMPLANT
WATER STERILE IRR 1000ML POUR (IV SOLUTION) ×1 IMPLANT
YANKAUER SUCT BULB TIP NO VENT (SUCTIONS) ×3 IMPLANT
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
## 2014-02-09 NOTE — Anesthesia Postprocedure Evaluation (Signed)
  Anesthesia Post-op Note  Patient: Dean Bowers  Procedure(s) Performed: Procedure(s): IRRIGATION AND DEBRIDEMENT Right BKA Stump (Right)  Patient Location: SICU  Anesthesia Type:General  Level of Consciousness: sedated and Patient remains intubated per anesthesia plan  Airway and Oxygen Therapy: Patient remains intubated per anesthesia plan and Patient placed on Ventilator (see vital sign flow sheet for setting)  Post-op Pain: none  Post-op Assessment: Post-op Vital signs reviewed, Patient's Cardiovascular Status Stable, RESPIRATORY FUNCTION UNSTABLE, Patent Airway, No signs of Nausea or vomiting and Pain level controlled  Post-op Vital Signs: stable  Last Vitals:  Filed Vitals:   02/09/14 1100  BP: 98/48  Pulse: 97  Temp: 38 C  Resp: 16    Complications: No apparent anesthesia complications

## 2014-02-09 NOTE — Consult Note (Signed)
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 NEGATIVE mg/dL   Protein, ur 30 (A) NEGATIVE mg/dL   Urobilinogen, UA 0.2 0.0 - 1.0 mg/dL   Nitrite NEGATIVE NEGATIVE   Leukocytes, UA LARGE (A) NEGATIVE  Urine microscopic-add on  Status: Abnormal   Collection Time: 02/08/14  8:44 PM  Result Value Ref Range   Squamous Epithelial / LPF RARE RARE   WBC, UA TOO NUMEROUS TO COUNT <3 WBC/hpf   RBC / HPF 7-10 <3 RBC/hpf   Bacteria, UA RARE RARE   Casts HYALINE CASTS (A) NEGATIVE    Comment: GRANULAR CAST   Crystals CA OXALATE CRYSTALS (A) NEGATIVE   Urine-Other LESS THAN 10 mL OF URINE SUBMITTED   Sodium, urine, random     Status: None   Collection  Time: 02/08/14  8:46 PM  Result Value Ref Range   Sodium, Ur 81 mmol/L  Creatinine, urine, random     Status: None   Collection Time: 02/08/14  8:46 PM  Result Value Ref Range   Creatinine, Urine 52.64 mg/dL  Lactate dehydrogenase     Status: None   Collection Time: 02/08/14  8:48 PM  Result Value Ref Range   LDH 235 94 - 250 U/L  Iron and TIBC     Status: Abnormal   Collection Time: 02/08/14  8:48 PM  Result Value Ref Range   Iron 13 (L) 42 - 165 ug/dL   TIBC 397 215 - 435 ug/dL   Saturation Ratios 3 (L) 20 - 55 %   UIBC 384 125 - 400 ug/dL    Comment: Performed at [facility]  CK     Status: None   Collection Time: 02/08/14  8:48 PM  Result Value Ref Range   Total CK 114 7 - 232 U/L  Glucose, capillary     Status: Abnormal   Collection Time: 02/08/14 11:50 PM  Result Value Ref Range   Glucose-Capillary 128 (H) 70 - 99 mg/dL  Glucose, capillary     Status: Abnormal   Collection Time: 02/09/14  4:11 AM  Result Value Ref Range   Glucose-Capillary 133 (H) 70 - 99 mg/dL  CBC     Status: Abnormal   Collection Time: 02/09/14  4:45 AM  Result Value Ref Range   WBC 13.6 (H) 4.0 - 10.5 K/uL   RBC 4.91 4.22 - 5.81 MIL/uL   Hemoglobin 10.3 (L) 13.0 - 17.0 g/dL   HCT 35.3 (L) 39.0 - 52.0 %   MCV 71.9 (L) 78.0 - 100.0 fL   MCH 21.0 (L) 26.0 - 34.0 pg   MCHC 29.2 (L) 30.0 - 36.0 g/dL   RDW 17.8 (H) 11.5 - 15.5 %   Platelets 255 150 - 400 K/uL  Phosphorus     Status: Abnormal   Collection Time: 02/09/14  4:45 AM  Result Value Ref Range   Phosphorus 8.7 (H) 2.3 - 4.6 mg/dL  Comprehensive metabolic panel     Status: Abnormal   Collection Time: 02/09/14  4:45 AM  Result Value Ref Range   Sodium 137 135 - 145 mmol/L    Comment: Please note change in reference range.   Potassium 4.2 3.5 - 5.1 mmol/L    Comment: Please note change in reference range.   Chloride 93 (L) 96 - 112 mEq/L   CO2 29 19 - 32 mmol/L   Glucose, Bld 125 (H) 70 - 99 mg/dL   BUN 58 (H) 6 - 23 mg/dL    Creatinine, Ser 6.13 (H) 0.50 - 1.35 mg/dL   Calcium 7.7 (L) 8.4 - 10.5 mg/dL   Total Protein 6.5 6.0 - 8.3 g/dL   Albumin 2.8 (L) 3.5 - 5.2 g/dL   AST 111 (H) 0 - 37 U/L  ALT 277 (H) 0 - 53 U/L   Alkaline Phosphatase 65 39 - 117 U/L   Total Bilirubin 0.8 0.3 - 1.2 mg/dL   GFR calc non Af Amer 10 (L) >90 mL/min   GFR calc Af Amer 12 (L) >90 mL/min    Comment: (NOTE) The eGFR has been calculated using the CKD EPI equation. This calculation has not been validated in all clinical situations. eGFR's persistently <90 mL/min signify possible Chronic Kidney Disease.    Anion gap 15 5 - 15  I-STAT 3, arterial blood gas (G3+)     Status: Abnormal   Collection Time: 02/09/14  6:58 AM  Result Value Ref Range   pH, Arterial 7.317 (L) 7.350 - 7.450   pCO2 arterial 66.3 (HH) 35.0 - 45.0 mmHg   pO2, Arterial 88.0 80.0 - 100.0 mmHg   Bicarbonate 33.6 (H) 20.0 - 24.0 mEq/L   TCO2 36 0 - 100 mmol/L   O2 Saturation 95.0 %   Acid-Base Excess 6.0 (H) 0.0 - 2.0 mmol/L   Patient temperature 99.9 F    Collection site ARTERIAL LINE    Drawn by RT    Sample type ARTERIAL    Comment NOTIFIED PHYSICIAN   Glucose, capillary     Status: Abnormal   Collection Time: 02/09/14  8:24 AM  Result Value Ref Range   Glucose-Capillary 117 (H) 70 - 99 mg/dL    Dg Chest 2 View  02/07/2014   CLINICAL DATA:  Shortness of breath.  Weakness.  Falls.  EXAM: CHEST  2 VIEW  COMPARISON:  01/28/2014  FINDINGS: Moderate enlargement of the cardiopericardial silhouette. Indistinct pulmonary vasculature. No pleural effusion. Posterolateral rod and pedicle screw fixation in the lumbar spine.  IMPRESSION: 1. Cardiomegaly and pulmonary venous hypertension.  No overt edema.   Electronically Signed   By: Sherryl Barters M.D.   On: 02/07/2014 18:04   Ct Head Wo Contrast  02/07/2014   CLINICAL DATA:  Two day history of dizziness, blurred vision and lethargy. Multiple recent falls without head injury.  EXAM: CT HEAD WITHOUT CONTRAST   TECHNIQUE: Contiguous axial images were obtained from the base of the skull through the vertex without intravenous contrast.  COMPARISON:  05/25/2012.  FINDINGS: Ventricular system normal in size and appearance for age. No mass lesion. No midline shift. No acute hemorrhage or hematoma. No extra-axial fluid collections. No evidence of acute infarction. No focal brain parenchymal abnormalities.  No focal osseous abnormalities involving the skull. Visualized paranasal sinuses, bilateral mastoid air cells, and bilateral middle ear cavities well-aerated.  IMPRESSION: Normal examination.   Electronically Signed   By: Evangeline Dakin M.D.   On: 02/07/2014 17:52   US Abdomen Port  02/08/2014   CLINICAL DATA:  Transaminitis.  Acute renal failure.  EXAM: ULTRASOUND PORTABLE ABDOMEN  COMPARISON:  CT scan abdomen dated 06/17/2013  FINDINGS: Gallbladder: No gallstones or wall thickening visualized. No sonographic Murphy sign noted.  Common bile duct: Diameter: 6.2 mm, normal.  Liver: No focal lesion identified. Within normal limits in parenchymal echogenicity.  IVC: No abnormality visualized.  Pancreas: Visualized portion unremarkable.  Spleen: Normal.  10.5 cm in length.  Right Kidney: Length: 13.9 cm. Echogenicity within normal limits. No mass or hydronephrosis visualized.  Left Kidney: Length: 12.3 cm. 7 mm stone in the mid left kidney. Echogenicity within normal limits. No mass or hydronephrosis visualized.  Abdominal aorta: 2.4 cm maximal diameter.  Other findings: None.  IMPRESSION: No acute abnormalities. 7 mm stone in the mid left  kidney. No hydronephrosis.   Electronically Signed   By: Geanie Cooley M.D.   On: 02/08/2014 09:39   Portable Chest Xray  02/08/2014   CLINICAL DATA:  Intubation.  Decreased blood pressure.  EXAM: PORTABLE CHEST - 1 VIEW  COMPARISON:  02/08/2014  FINDINGS: Endotracheal tube tip measures 5.4 cm above the carinal. Enteric tube tip is below the left hemidiaphragm off the field of view. Cardiac  enlargement. Left central venous catheter with tip over the central mediastinum, likely in the brachiocephalic vein. Pulmonary vascular congestion. No edema or consolidation. No blunting of costophrenic angles. No pneumothorax.  IMPRESSION: Right central venous catheter tip overlies the brachiocephalic vein. Endotracheal tube tip measures 5.4 cm above the carina. Persistent cardiac enlargement and pulmonary vascular congestion.   Electronically Signed   By: Burman Nieves M.D.   On: 02/08/2014 05:10   Dg Chest Port 1 View  02/08/2014   CLINICAL DATA:  Elevated troponin I level. Unresponsive. Initial encounter.  EXAM: PORTABLE CHEST - 1 VIEW  COMPARISON:  Chest radiograph from 02/07/2014  FINDINGS: The lungs are mildly hypoexpanded. Vascular congestion is noted, with increased interstitial markings, raising concern for mild pulmonary edema. No definite pleural effusion or pneumothorax is seen.  The cardiomediastinal silhouette is enlarged. No acute osseous abnormalities are identified.  IMPRESSION: Lungs mildly hypoexpanded. Vascular congestion and cardiomegaly, with increased interstitial markings, raising concern for mild new pulmonary edema.   Electronically Signed   By: Roanna Raider M.D.   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  EXAM: RIGHT KNEE - COMPLETE 4+ VIEW  COMPARISON:  05/26/2012.  FINDINGS: Gas within the soft tissues of the anterolateral aspect of the stump, extending deep to the level of the tibia. Mild irregularity involving the distal tibia. Osseous demineralization.  IMPRESSION: Gas within the soft tissues of the anterolateral aspect of the stump, extending deep to the level of the tibia where there may be early changes of osteomyelitis.   Electronically Signed   By: Hulan Saas M.D.   On: 02/07/2014 18:06   Dg Abd Portable 1v  02/08/2014   CLINICAL DATA:  Gastric tube placement.   EXAM: PORTABLE ABDOMEN - 1 VIEW  COMPARISON:  None.  FINDINGS: AP portable 5 and low upper abdominal radiographs demonstrate nasogastric tube with its tip along the greater curvature of the stomach. The heart is enlarged. The patient is intubated. BILATERAL pleural effusions and basilar atelectasis are suspected.  IMPRESSION: Nasogastric tube tip lies in the stomach along the greater curvature.   Electronically Signed   By: Davonna Belling M.D.   On: 02/08/2014 08:03   Ct Extrem Lower Wo Cm Bil  02/08/2014   CLINICAL DATA:  Draining wound from a right BKA stump. Diffuse cellulitis and both lower extremities.  EXAM: CT OF THE LOWER BILATERAL EXTREMITY WITHOUT CONTRAST  TECHNIQUE: Multidetector CT imaging of the bilateral lower extremities was performed according to the standard protocol.  COMPARISON:  Radiographs 02/07/2014  FINDINGS: There is diffuse subcutaneous soft tissue swelling/ edema and skin thickening involving both lower extremities suggesting cellulitis. No definite findings for myofasciitis. There is an intra medullary rod in the right femur with reactive changes but no definite findings for osteomyelitis. There is an open wound at the right BKA stump with fluid and air noted in the subcutaneous fat surrounding the bony structures. This is consistent with an abscess. I do  not see any definite destructive bony changes but the air and fluid both right to the bone and osteomyelitis is likely.  The left lower extremity does not demonstrate any definite changes of septic arthritis or osteomyelitis.  IMPRESSION: Bilateral lower extremity cellulitis.  No findings for myofasciitis.  Fluid and air surrounding the right BKA stump consistent with an abscess. No definite destructive bony changes but osteomyelitis is likely.   Electronically Signed   By: Kalman Jewels M.D.   On: 02/08/2014 12:54    Review of Systems  Constitutional: Negative for fever.  Respiratory:       On ventilator but awake   Cardiovascular: Negative for chest pain and palpitations.  Musculoskeletal:       Denies lower extremity pain  Neurological: Negative for tingling and sensory change.   Blood pressure 113/54, pulse 93, temperature 100.2 F (37.9 C), temperature source Oral, resp. rate 20, height $RemoveBe'5\' 10"'gtAljgGEJ$  (1.778 m), weight 182.8 kg (403 lb), SpO2 94 %. Physical Exam  Constitutional:  Intubated but awake Morbidly obese white male  Musculoskeletal:  Right lower extremity   Small dressing to the lateral aspect of the right BKA   This was removed and revealed a macerated area but the opening is likely the size of a pencil eraser or less   No significant erythema is noted   No odor appreciated but I did not attempt to express much fluid   The remainder of his stump appears to be stable his wound edges are inverted   Stump is nontender to palpation    Assessment/Plan:  44 year old white male admitted for decreased urinary output and drainage from right BKA with acute respiratory failure on ventilator and anasarca  1. Drainage lateral right BKA stump, abscess, possible osteomyelitis  OR today for I&D of abscess  Patient currently on Zosyn and vancomycin  Will likely place a VAC or other drain after I&D and will return later in the week for closure  Did discuss with radiologist the feasibility of obtaining an MRI with gadolinium to evaluate for osteomyelitis prior to taking the patient to the operating room. Secondary to the patient's morbid obesity we would have to use the T3 magnet which by itself decreases resolution as it creates a larger field. This may in turn cause his femoral nail to scatter more artifact and give [place] a suboptimal picture. Based on the discussion we feel that this provides little useful information as we need to proceed to the OR regardless  2. Multiple medical issues: Per critical care, internal medicine and nephrology  3. Disposition  OR for I&D abscess R BKA   Jari Pigg,  PA-C [facility] [phone] (P) 02/09/2014, 9:30 AM    I have seen and examined the patient. I agree with the findings above. Examination RLE with edema and min erythema--not impressive but planning to proceed. Risks and benefits discussed.    Rozanna Box, MD

## 2014-02-09 NOTE — Progress Notes (Signed)
Patient ID: Dean Bowers, male   DOB: [birth year], 44 y.o.   MRN: [id number]  [facility] Progress Note    Assessment/ Plan:   1. Acute renal failure: Suspected to be hemodynamically mediated with sepsis/hypotension in the setting of ACE inhibitor-possibly evolving into ATN. Fortunately, he is responsive to diuretics and without any dialysis needs at this time. We'll continue expectant monitoring as we support his blood pressure and limit nephrotoxin exposure. Ongoing inquiry for postinfectious GN 2. Cellulitis/drainage from right BKA stump: Anticipate incision and drainage today. Remains on antibiotic therapy with vancomycin and Zosyn. 3. Anemia: Likely anemia of chronic disease, continue to monitor particularly for postoperative drops/PRBC triggers. 4. Anasarca: Continue diuretic therapy at this time-change furosemide to 120 mg 3 times a day intravenously.  Subjective:   No acute events overnight- anticipated incision and drainage of right BKA stump today to help evacuate purulent drainage.    Objective:   BP 113/54 mmHg  Pulse 93  Temp(Src) 100.2 F (37.9 C) (Oral)  Resp 20  Ht 5\' 10"  (1.778 m)  Wt 182.8 kg (403 lb)  BMI 57.82 kg/m2  SpO2 94%  Intake/Output Summary (Last 24 hours) at 02/09/14 0844 Last data filed at 02/09/14 0800  Gross per 24 hour  Intake 3127.17 ml  Output   7125 ml  Net -3997.83 ml   Weight change: -6.025 kg (-13 lb 4.5 oz)  Physical Exam: Gen: Comfortable on the vent-family at bedside CVS: Pulse regular in rate and rhythm, S1 and S2 normal Resp: Clear to auscultation, no rales/rhonchi Abd: Soft, obese, nontender Ext: Right BKA stump, 2-3+ anasarca  Imaging: Dg Chest 2 View  02/07/2014   CLINICAL DATA:  Shortness of breath.  Weakness.  Falls.  EXAM: CHEST  2 VIEW  COMPARISON:  01/28/2014  FINDINGS: Moderate enlargement of the cardiopericardial silhouette. Indistinct pulmonary vasculature. No pleural effusion. Posterolateral rod and  pedicle screw fixation in the lumbar spine.  IMPRESSION: 1. Cardiomegaly and pulmonary venous hypertension.  No overt edema.   Electronically Signed   By: Sherryl Barters M.D.   On: 02/07/2014 18:04   Ct Head Wo Contrast  02/07/2014   CLINICAL DATA:  Two day history of dizziness, blurred vision and lethargy. Multiple recent falls without head injury.  EXAM: CT HEAD WITHOUT CONTRAST  TECHNIQUE: Contiguous axial images were obtained from the base of the skull through the vertex without intravenous contrast.  COMPARISON:  05/25/2012.  FINDINGS: Ventricular system normal in size and appearance for age. No mass lesion. No midline shift. No acute hemorrhage or hematoma. No extra-axial fluid collections. No evidence of acute infarction. No focal brain parenchymal abnormalities.  No focal osseous abnormalities involving the skull. Visualized paranasal sinuses, bilateral mastoid air cells, and bilateral middle ear cavities well-aerated.  IMPRESSION: Normal examination.   Electronically Signed   By: Evangeline Dakin M.D.   On: 02/07/2014 17:52   US Abdomen Port  02/08/2014   CLINICAL DATA:  Transaminitis.  Acute renal failure.  EXAM: ULTRASOUND PORTABLE ABDOMEN  COMPARISON:  CT scan abdomen dated 06/17/2013  FINDINGS: Gallbladder: No gallstones or wall thickening visualized. No sonographic Murphy sign noted.  Common bile duct: Diameter: 6.2 mm, normal.  Liver: No focal lesion identified. Within normal limits in parenchymal echogenicity.  IVC: No abnormality visualized.  Pancreas: Visualized portion unremarkable.  Spleen: Normal.  10.5 cm in length.  Right Kidney: Length: 13.9 cm. Echogenicity within normal limits. No mass or hydronephrosis visualized.  Left Kidney: Length: 12.3 cm. 7 mm stone  in the mid left kidney. Echogenicity within normal limits. No mass or hydronephrosis visualized.  Abdominal aorta: 2.4 cm maximal diameter.  Other findings: None.  IMPRESSION: No acute abnormalities. 7 mm stone in the mid left  kidney. No hydronephrosis.   Electronically Signed   By: Rozetta Nunnery M.D.   On: 02/08/2014 09:39   Portable Chest Xray  02/08/2014   CLINICAL DATA:  Intubation.  Decreased blood pressure.  EXAM: PORTABLE CHEST - 1 VIEW  COMPARISON:  02/08/2014  FINDINGS: Endotracheal tube tip measures 5.4 cm above the carinal. Enteric tube tip is below the left hemidiaphragm off the field of view. Cardiac enlargement. Left central venous catheter with tip over the central mediastinum, likely in the brachiocephalic vein. Pulmonary vascular congestion. No edema or consolidation. No blunting of costophrenic angles. No pneumothorax.  IMPRESSION: Right central venous catheter tip overlies the brachiocephalic vein. Endotracheal tube tip measures 5.4 cm above the carina. Persistent cardiac enlargement and pulmonary vascular congestion.   Electronically Signed   By: Lucienne Capers M.D.   On: 02/08/2014 05:10   Dg Chest Port 1 View  02/08/2014   CLINICAL DATA:  Elevated troponin I level. Unresponsive. Initial encounter.  EXAM: PORTABLE CHEST - 1 VIEW  COMPARISON:  Chest radiograph from 02/07/2014  FINDINGS: The lungs are mildly hypoexpanded. Vascular congestion is noted, with increased interstitial markings, raising concern for mild pulmonary edema. No definite pleural effusion or pneumothorax is seen.  The cardiomediastinal silhouette is enlarged. No acute osseous abnormalities are identified.  IMPRESSION: Lungs mildly hypoexpanded. Vascular congestion and cardiomegaly, with increased interstitial markings, raising concern for mild new pulmonary edema.   Electronically Signed   By: Garald Balding M.D.   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  EXAM: RIGHT KNEE - COMPLETE 4+ VIEW  COMPARISON:  05/26/2012.  FINDINGS: Gas within the soft tissues of the anterolateral aspect of the stump, extending deep to the level of the tibia. Mild  irregularity involving the distal tibia. Osseous demineralization.  IMPRESSION: Gas within the soft tissues of the anterolateral aspect of the stump, extending deep to the level of the tibia where there may be early changes of osteomyelitis.   Electronically Signed   By: Evangeline Dakin M.D.   On: 02/07/2014 18:06   Dg Abd Portable 1v  02/08/2014   CLINICAL DATA:  Gastric tube placement.  EXAM: PORTABLE ABDOMEN - 1 VIEW  COMPARISON:  None.  FINDINGS: AP portable 5 and low upper abdominal radiographs demonstrate nasogastric tube with its tip along the greater curvature of the stomach. The heart is enlarged. The patient is intubated. BILATERAL pleural effusions and basilar atelectasis are suspected.  IMPRESSION: Nasogastric tube tip lies in the stomach along the greater curvature.   Electronically Signed   By: Rolla Flatten M.D.   On: 02/08/2014 08:03   Ct Extrem Lower Wo Cm Bil  02/08/2014   CLINICAL DATA:  Draining wound from a right BKA stump. Diffuse cellulitis and both lower extremities.  EXAM: CT OF THE LOWER BILATERAL EXTREMITY WITHOUT CONTRAST  TECHNIQUE: Multidetector CT imaging of the bilateral lower extremities was performed according to the standard protocol.  COMPARISON:  Radiographs 02/07/2014  FINDINGS: There is diffuse subcutaneous soft tissue swelling/ edema and skin thickening involving both lower extremities suggesting cellulitis. No definite findings for myofasciitis. There is an intra medullary rod in the right femur with reactive changes  but no definite findings for osteomyelitis. There is an open wound at the right BKA stump with fluid and air noted in the subcutaneous fat surrounding the bony structures. This is consistent with an abscess. I do not see any definite destructive bony changes but the air and fluid both right to the bone and osteomyelitis is likely.  The left lower extremity does not demonstrate any definite changes of septic arthritis or osteomyelitis.  IMPRESSION: Bilateral  lower extremity cellulitis.  No findings for myofasciitis.  Fluid and air surrounding the right BKA stump consistent with an abscess. No definite destructive bony changes but osteomyelitis is likely.   Electronically Signed   By: Kalman Jewels M.D.   On: 02/08/2014 12:54    Labs: BMET  Recent Labs Lab 02/03/14 1229 02/07/14 1813 02/07/14 1826 02/08/14 0620 02/08/14 1730 02/09/14 0445  NA 140 133* 135 135 136 137  K 3.9 4.2 4.2 5.0 4.0 4.2  CL 95* 92* 95* 93* 96 93*  CO2 33* 28  --  30 30 29   GLUCOSE 113* 85 89 137* 107* 125*  BUN 23 51* 51* 53* 59* 58*  CREATININE 0.94 5.16* 4.70* 6.17* 6.25* 6.13*  CALCIUM 8.6 8.0*  --  7.6* 7.8* 7.7*  PHOS  --   --   --   --   --  8.7*   CBC  Recent Labs Lab 02/07/14 1826 02/07/14 1930 02/08/14 0620 02/09/14 0445  WBC  --  14.2* 13.9* 13.6*  NEUTROABS  --  11.1*  --   --   HGB 15.3 10.8* 9.9* 10.3*  HCT 45.0 37.7* 36.8* 35.3*  MCV  --  75.4* 78.1 71.9*  PLT  --  266 282 255    Medications:    . antiseptic oral rinse  7 mL Mouth Rinse QID  . budesonide  0.25 mg Nebulization 4 times per day  . chlorhexidine  15 mL Mouth Rinse BID  . feeding supplement (PRO-STAT SUGAR FREE 64)  60 mL Per Tube TID  . feeding supplement (VITAL HIGH PROTEIN)  1,000 mL Per Tube Q24H  . heparin  5,000 Units Subcutaneous 3 times per day  . insulin aspart  0-20 Units Subcutaneous 6 times per day  . ipratropium-albuterol  3 mL Nebulization Q6H  . multivitamin with minerals  1 tablet Oral Daily  . pantoprazole (PROTONIX) IV  40 mg Intravenous Daily  . piperacillin-tazobactam (ZOSYN)  IV  2.25 g Intravenous Q8H  . sodium chloride  3 mL Intravenous Q12H  . vancomycin  2,000 mg Intravenous Q48H   Elmarie Shiley, MD 02/09/2014, 8:44 AM

## 2014-02-09 NOTE — Transfer of Care (Signed)
Immediate Anesthesia Transfer of Care Note  Patient: Dean Bowers  Procedure(s) Performed: Procedure(s): IRRIGATION AND DEBRIDEMENT Right BKA Stump (Right)  Patient Location: ICU  Anesthesia Type:General  Level of Consciousness: sedated and Patient remains intubated per anesthesia plan  Airway & Oxygen Therapy: Patient remains intubated per anesthesia plan and Patient placed on Ventilator (see vital sign flow sheet for setting)  Post-op Assessment: Post -op Vital signs reviewed and stable, Post -op Vital signs reviewed and unstable, Anesthesiologist notified and Report to ICU RN  Post vital signs: Reviewed and stable  Complications: No apparent anesthesia complications

## 2014-02-09 NOTE — Progress Notes (Signed)
PULMONARY / CRITICAL CARE MEDICINE   Name: Dean Bowers MRN: [id number] DOB: 1970-12-19    ADMISSION DATE:  02/07/2014 CONSULTATION DATE:  02/08/14  REFERRING MD :  [facility] ([facility])   CHIEF COMPLAINT:  Respiratory failure   INITIAL PRESENTATION: 44yo morbidly obese male active smoker with hx HTN, polysubstance abuse, R BKA presented 1/3 with decreased UOP, fluid retention and drainage from R BKA stump. Was seen previously as oupt by urgent care and urology and prescribed lasix, also continued to take his ACE and multiple NSAIDs.  Admitted by [facility] with acute renal failure with SCr >5, cellulitis of R stump and sepsis.  Overnight had progressive AMS and ABG revealed respiratory acidosis with pH 7.09/ PCo2 >100, tx to ICU and PCCM consulted.   STUDIES:  CT head 1/3>>> neg acute  Abd u/s 1/4>>> 56mm stone in mid L kidney. No hydronephrosis pABD xray 1/4>>>Nasogastric tube tip lies in the stomach along the greater curvature pCXR 1/4>>> Persistent cardiac enlargement and pulmonary vascular congestion. R Knee Xray 1/4>>>Gas within the soft tissues of the anterolateral aspect of the stump, extending deep to the level of the tibia where there may be early changes of osteomyelitis CT lower extremities 1/4>>>Bilateral lower extremity cellulitis. No findings for myofasciitis. Fluid and air surrounding the right BKA stump consistent with an abscess. No definite destructive bony changes but osteomyelitis is likely. B/L lower extremity venous duplex 1/4--Preliminary report: No obvious evidence of DVT, superficial thrombosis, or Baker's Cyst. Technically limited by body habitus.  Lines: OETT 1/4>>> NGT 1/4>>> LIJ 1/4>>> Foley 1/4>>>  Cultures: Blood cx x2 1/4>>> Urine cx 1/4>>>100,000 colonies, Ecoli Wound cx R stump 1/3>>>  Abx: Vanc 1/4>>> Zosyn 1/4>>>  SIGNIFICANT EVENTS: 1/4: Admit for acute renal failure Cr >5, cellulitis of R stump and sepsis.  AMS and respiratory acidosis overnight.  Intubated and transferred to [facility] for Ortho surgery 1/5 (I&D)  SUBJECTIVE:  Intubated but easily arousable and following commands. Denies any pain. Surgery later today.   VITAL SIGNS: Temp:  [96.1 F (35.6 C)-100.3 F (37.9 C)] 100.2 F (37.9 C) (01/05 0800) Pulse Rate:  [67-106] 93 (01/05 0822) Resp:  [10-26] 20 (01/05 0822) BP: (79-123)/(40-56) 113/54 mmHg (01/05 0822) SpO2:  [92 %-100 %] 94 % (01/05 0822) Arterial Line BP: (52-148)/(37-72) 119/56 mmHg (01/05 0800) FiO2 (%):  [40 %-60 %] 40 % (01/05 0822) Weight:  [403 lb (182.8 kg)-410 lb (185.975 kg)] 403 lb (182.8 kg) (01/05 0420) HEMODYNAMICS:   VENTILATOR SETTINGS: Vent Mode:  [-] PRVC FiO2 (%):  [40 %-60 %] 40 % Set Rate:  [18 bmp-20 bmp] 20 bmp Vt Set:  [580 mL] 580 mL PEEP:  [5 cmH20] 5 cmH20 Plateau Pressure:  [27 [id number] cmH20] 28 cmH20 INTAKE / OUTPUT:  Intake/Output Summary (Last 24 hours) at 02/09/14 0908 Last data filed at 02/09/14 0800  Gross per 24 hour  Intake 2990.54 ml  Output   6950 ml  Net -3959.46 ml   PHYSICAL EXAMINATION: General:  Morbidly obese male, anasarca  Neuro:  Arousable, following commands  HEENT:  Opens eyes to voice, ETT in place Cardiovascular:  RRR, distant heart sounds due to body habitus Lungs:  Coarse b/l breath sounds, diffuse wheezing Abdomen:  Obese, soft, edematous with pitting, erythematous lower abdomen Musculoskeletal:  Warm and dry, R BKA stump erythematous, visible drainage from puncture site, dressing soaked, LLE erythematous/warm  LABS:  CBC  Recent Labs Lab 02/07/14 1930 02/08/14 0620 02/09/14 0445  WBC 14.2* 13.9* 13.6*  HGB 10.8* 9.9* 10.3*  HCT 37.7* 36.8* 35.3*  PLT 266 282 255   Coag's  Recent Labs Lab 02/07/14 1813  APTT 23*  INR 1.02   BMET  Recent Labs Lab 02/08/14 0620 02/08/14 1730 02/09/14 0445  NA 135 136 137  K 5.0 4.0 4.2  CL 93* 96 93*  CO2 30 30 29   BUN 53* 59* 58*  CREATININE 6.17* 6.25* 6.13*  GLUCOSE 137* 107* 125*    Electrolytes  Recent Labs Lab 02/08/14 0620 02/08/14 1730 02/09/14 0445  CALCIUM 7.6* 7.8* 7.7*  PHOS  --   --  8.7*   Sepsis Markers  Recent Labs Lab 02/07/14 2350 02/08/14 0620  LATICACIDVEN 1.3 0.8  PROCALCITON  --  0.26   ABG  Recent Labs Lab 02/08/14 0650 02/08/14 1955 02/09/14 0658  PHART 7.212* 7.299* 7.317*  PCO2ART 71.3* 56.2* 66.3*  PO2ART 295.0* 83.5 88.0   Liver Enzymes  Recent Labs Lab 02/07/14 1813 02/08/14 0620 02/09/14 0445  AST 525* 243* 111*  ALT 500* 365* 277*  ALKPHOS 70 61 65  BILITOT 0.7 0.8 0.8  ALBUMIN 3.4* 3.0* 2.8*   Cardiac Enzymes  Recent Labs Lab 02/07/14 2350 02/08/14 0620 02/08/14 1000  TROPONINI 1.42* 1.20* 1.11*   Glucose  Recent Labs Lab 02/08/14 1242 02/08/14 1623 02/08/14 1936 02/08/14 2350 02/09/14 0411 02/09/14 0824  GLUCAP 90 84 90 128* 133* 117*   Imaging US Abdomen Port  02/08/2014   CLINICAL DATA:  Transaminitis.  Acute renal failure.  EXAM: ULTRASOUND PORTABLE ABDOMEN  COMPARISON:  CT scan abdomen dated 06/17/2013  FINDINGS: Gallbladder: No gallstones or wall thickening visualized. No sonographic Murphy sign noted.  Common bile duct: Diameter: 6.2 mm, normal.  Liver: No focal lesion identified. Within normal limits in parenchymal echogenicity.  IVC: No abnormality visualized.  Pancreas: Visualized portion unremarkable.  Spleen: Normal.  10.5 cm in length.  Right Kidney: Length: 13.9 cm. Echogenicity within normal limits. No mass or hydronephrosis visualized.  Left Kidney: Length: 12.3 cm. 7 mm stone in the mid left kidney. Echogenicity within normal limits. No mass or hydronephrosis visualized.  Abdominal aorta: 2.4 cm maximal diameter.  Other findings: None.  IMPRESSION: No acute abnormalities. 7 mm stone in the mid left kidney. No hydronephrosis.   Electronically Signed   By: Rozetta Nunnery M.D.   On: 02/08/2014 09:39   Portable Chest Xray  02/08/2014   CLINICAL DATA:  Intubation.  Decreased blood  pressure.  EXAM: PORTABLE CHEST - 1 VIEW  COMPARISON:  02/08/2014  FINDINGS: Endotracheal tube tip measures 5.4 cm above the carinal. Enteric tube tip is below the left hemidiaphragm off the field of view. Cardiac enlargement. Left central venous catheter with tip over the central mediastinum, likely in the brachiocephalic vein. Pulmonary vascular congestion. No edema or consolidation. No blunting of costophrenic angles. No pneumothorax.  IMPRESSION: Right central venous catheter tip overlies the brachiocephalic vein. Endotracheal tube tip measures 5.4 cm above the carina. Persistent cardiac enlargement and pulmonary vascular congestion.   Electronically Signed   By: Lucienne Capers M.D.   On: 02/08/2014 05:10   Dg Chest Port 1 View  02/08/2014   CLINICAL DATA:  Elevated troponin I level. Unresponsive. Initial encounter.  EXAM: PORTABLE CHEST - 1 VIEW  COMPARISON:  Chest radiograph from 02/07/2014  FINDINGS: The lungs are mildly hypoexpanded. Vascular congestion is noted, with increased interstitial markings, raising concern for mild pulmonary edema. No definite pleural effusion or pneumothorax is seen.  The cardiomediastinal silhouette is enlarged. No acute osseous abnormalities are  identified.  IMPRESSION: Lungs mildly hypoexpanded. Vascular congestion and cardiomegaly, with increased interstitial markings, raising concern for mild new pulmonary edema.   Electronically Signed   By: Garald Balding M.D.   On: 02/08/2014 01:39   Dg Abd Portable 1v  02/08/2014   CLINICAL DATA:  Gastric tube placement.  EXAM: PORTABLE ABDOMEN - 1 VIEW  COMPARISON:  None.  FINDINGS: AP portable 5 and low upper abdominal radiographs demonstrate nasogastric tube with its tip along the greater curvature of the stomach. The heart is enlarged. The patient is intubated. BILATERAL pleural effusions and basilar atelectasis are suspected.  IMPRESSION: Nasogastric tube tip lies in the stomach along the greater curvature.   Electronically  Signed   By: Rolla Flatten M.D.   On: 02/08/2014 08:03   Ct Extrem Lower Wo Cm Bil  02/08/2014   CLINICAL DATA:  Draining wound from a right BKA stump. Diffuse cellulitis and both lower extremities.  EXAM: CT OF THE LOWER BILATERAL EXTREMITY WITHOUT CONTRAST  TECHNIQUE: Multidetector CT imaging of the bilateral lower extremities was performed according to the standard protocol.  COMPARISON:  Radiographs 02/07/2014  FINDINGS: There is diffuse subcutaneous soft tissue swelling/ edema and skin thickening involving both lower extremities suggesting cellulitis. No definite findings for myofasciitis. There is an intra medullary rod in the right femur with reactive changes but no definite findings for osteomyelitis. There is an open wound at the right BKA stump with fluid and air noted in the subcutaneous fat surrounding the bony structures. This is consistent with an abscess. I do not see any definite destructive bony changes but the air and fluid both right to the bone and osteomyelitis is likely.  The left lower extremity does not demonstrate any definite changes of septic arthritis or osteomyelitis.  IMPRESSION: Bilateral lower extremity cellulitis.  No findings for myofasciitis.  Fluid and air surrounding the right BKA stump consistent with an abscess. No definite destructive bony changes but osteomyelitis is likely.   Electronically Signed   By: Kalman Jewels M.D.   On: 02/08/2014 12:54   ASSESSMENT / PLAN:  PULMONARY OETT 1/4>>> VDRF Respiratory acidosis--pH improving  ? OSA/OHS  ABG    Component Value Date/Time   PHART 7.317* 02/09/2014 0658   PCO2ART 66.3* 02/09/2014 0658   PO2ART 88.0 02/09/2014 0658   HCO3 33.6* 02/09/2014 0658   TCO2 36 02/09/2014 0658   ACIDBASEDEF 1.0 02/08/2014 0650   O2SAT 95.0 02/09/2014 0658   P:   Vent support: 580/20/5/40 currently F/u ABG in AM - increase MV,rate 24 BD's - duoneb, pulmicort (home rx breo) bid F/u CXR  Daily SBT after correction ph Fentanyl  for sedation  CARDIOVASCULAR Hx HTN  Hx CAD  Septic shock--requiring pressors NSTEMI--trop trending down from 1.4-->1.1, new TWI lateral inferior leads (leads III, aVF, V3-V5) P:  Holding home Anti-HTN  F/u EKG  Levophed to keep MAP >65 (titrated up overnight) TTE to eval L and R heart fxn Check CVP Low threshold dc lasix Echo, asa post op as able unable to hep with surgery needed  RENAL Acute renal failure likely multifactorial including ATN in setting of sepsis, hypotension and on ACEi and NSAIDs--Cr 5.16 on admission up to 6.25 1/4. Slightly improved this morning.  Anasarca  Recent Labs Lab 02/07/14 1813 02/07/14 1826 02/08/14 0620 02/08/14 1730 02/09/14 0445  CREATININE 5.16* 4.70* 6.17* 6.25* 6.13*   P:   KVO fluids Responding to diuresis--continue lasix, changed to 120mg  iv tid Renal following--continue lasix per renal, can we  dc lasix? Monitor I/O's--keep foley, 5.5L urine output yesterday, net neg 3L since admission  GASTROINTESTINAL Morbid obesity  Elevated transaminases--trending down P:   PPI  TF hold for OR F/u LFT's   HEMATOLOGIC Leukocytosis  Microcytic anemia--Hb 10.3, MCV 71.9 P:  SQ heparin  Will check ferritin, although iron low and TIBC 397 more suggestive of chronic disease Monitor Hb closely especially post op  INFECTIOUS Septic shock likely 2/2 UTI and R stump cellulitis and likely osteomyelitis, LLE cellulitis--Temp trending up, leukocytosis, Tmax 100.26F today IV abx day 2 Pct 0.26 P:   BCx2 1/4>>> UC 1/4>>>  Vanc 1/4>>> Zosyn 1/4>>>  Ortho following--I&D in OR this afternoon clinda if strep noted, no nec fasc suspected  ENDOCRINE A1C 6.5 on admission--new diagnosis P:   SSI with TF   NEUROLOGIC AMS - r/t hypercarbia, resp failure, sepsis--arousable today Polysubstance abuse--UDS positive for benzodiazepines and cocaine P:   RASS goal: -1  D/C'd propofol given hypotension Fentanyl PRN  Daily WUA   FAMILY  -  Updates:  Reviewed status and plans with pt's wife at bedside 1/4, family at bedside 1/5  - Inter-disciplinary family meet or Palliative Care meeting due by:  1/11  Signed: Jerene Pitch, MD PGY-3, Internal Medicine Resident Pager: [phone]  02/09/2014,10:01 AM  STAFF NOTE: Linwood Dibbles, MD FACP have personally reviewed patient's available data, including medical history, events of note, physical examination and test results as part of my evaluation. I have discussed with resident/NP and other care providers such as pharmacist, RN and RRT. In addition, I personally evaluated patient and elicited key findings of: increase vent rate, distant sounds, wound reviewed, son updated, to OR, cont ABX, holding TF, can we hold lasix?, levo at 25, may need add vaso, cortisol, add stress roids The patient is critically ill with multiple organ systems failure and requires high complexity decision making for assessment and support, frequent evaluation and titration of therapies, application of advanced monitoring technologies and extensive interpretation of multiple databases.   Critical Care Time devoted to patient care services described in this note is 30 Minutes. This time reflects time of care of this signee: Merrie Roof, MD FACP. This critical care time does not reflect procedure time, or teaching time or supervisory time of PA/NP/Med student/Med Resident etc but could involve care discussion time. Rest per NP/medical resident whose note is outlined above and that I agree with   Lavon Paganini. Titus Mould, MD, [place] Pgr: [facility] 02/09/2014 10:46 AM

## 2014-02-09 NOTE — Brief Op Note (Signed)
02/07/2014 - 02/09/2014  1:20 PM  PATIENT:  Dean Bowers  44 y.o. male  PRE-OPERATIVE DIAGNOSIS:  Abscess Right BKA   POST-OPERATIVE DIAGNOSIS:  Abscess Right BKA  PROCEDURE:  Procedure(s): 1. INCISION AND DEBRIDEMENT Right BKA Stump (Right) Abscess 2. Excision draining sinus tract  SURGEON:  Surgeon(s) and Role:    * Rozanna Box, MD - Primary  PHYSICIAN ASSISTANT: [name], PA-C  ANESTHESIA:   general  I/O:  Total I/O In: 424.6 [I.V.:368; NG/GT:6.7; IV Piggyback:50] Out: 2625 [Urine:2025; Emesis/NG output:600]  SPECIMEN:  Source of Specimen:  Right leg abscess soft tissue and bone  DISPOSITION OF SPECIMEN:  To micro  TOURNIQUET:  * No tourniquets in log *  DICTATION: .Other Dictation: Dictation Number [id number]

## 2014-02-09 NOTE — Progress Notes (Signed)
Remifentanil 2mg /183ml infusion returned to pharmacy unused. Wasted in red absorbant sharps container with [employer], CPhT.  Angy Swearengin S. Alford Highland, PharmD, [facility] Clinical Staff Pharmacist Pager [phone]

## 2014-02-09 NOTE — Procedures (Signed)
Arterial Catheter Insertion Procedure Note TOSHIO SLUSHER [id number] [birth year]  Procedure: Insertion of Arterial Catheter  Indications: Blood pressure monitoring and Frequent blood sampling  Procedure Details Consent: Risks of procedure as well as the alternatives and risks of each were explained to the (patient/caregiver).  Consent for procedure obtained. Time Out: Verified patient identification, verified procedure, site/side was marked, verified correct patient position, special equipment/implants available, medications/allergies/relevent history reviewed, required imaging and test results available.  Performed  Maximum sterile technique was used including antiseptics, cap, gloves, gown, hand hygiene, mask and sheet. Skin prep: Chlorhexidine; local anesthetic administered 20 gauge catheter was inserted into right radial artery using the Seldinger technique.  Evaluation Blood flow good; BP tracing good. Complications: No apparent complications.   Dulcy Fanny 02/09/2014

## 2014-02-09 NOTE — Progress Notes (Signed)
Pt taken off vent at this time and being bagged to OR by OR team.

## 2014-02-10 ENCOUNTER — Ambulatory Visit: Payer: 59 | Admitting: Cardiovascular Disease

## 2014-02-10 ENCOUNTER — Inpatient Hospital Stay (HOSPITAL_COMMUNITY): Payer: 59

## 2014-02-10 DIAGNOSIS — R1010 Upper abdominal pain, unspecified: Secondary | ICD-10-CM

## 2014-02-10 DIAGNOSIS — J9600 Acute respiratory failure, unspecified whether with hypoxia or hypercapnia: Secondary | ICD-10-CM

## 2014-02-10 DIAGNOSIS — R109 Unspecified abdominal pain: Secondary | ICD-10-CM | POA: Diagnosis present

## 2014-02-10 LAB — COMPREHENSIVE METABOLIC PANEL
ALBUMIN: 2.7 g/dL — AB (ref 3.5–5.2)
ALT: 184 U/L — ABNORMAL HIGH (ref 0–53)
AST: 63 U/L — ABNORMAL HIGH (ref 0–37)
Alkaline Phosphatase: 56 U/L (ref 39–117)
Anion gap: 12 (ref 5–15)
BILIRUBIN TOTAL: 1.3 mg/dL — AB (ref 0.3–1.2)
BUN: 58 mg/dL — ABNORMAL HIGH (ref 6–23)
CO2: 34 mmol/L — AB (ref 19–32)
Calcium: 8.2 mg/dL — ABNORMAL LOW (ref 8.4–10.5)
Chloride: 94 mEq/L — ABNORMAL LOW (ref 96–112)
Creatinine, Ser: 4.58 mg/dL — ABNORMAL HIGH (ref 0.50–1.35)
GFR calc Af Amer: 17 mL/min — ABNORMAL LOW (ref >90)
GFR, EST NON AFRICAN AMERICAN: 14 mL/min — AB (ref >90)
Glucose, Bld: 154 mg/dL — ABNORMAL HIGH (ref 70–99)
POTASSIUM: 4.3 mmol/L (ref 3.5–5.1)
Sodium: 140 mmol/L (ref 135–145)
Total Protein: 6.5 g/dL (ref 6.0–8.3)

## 2014-02-10 LAB — BASIC METABOLIC PANEL
Anion gap: 11 (ref 5–15)
BUN: 62 mg/dL — ABNORMAL HIGH (ref 6–23)
CHLORIDE: 94 meq/L — AB (ref 96–112)
CO2: 37 mmol/L — ABNORMAL HIGH (ref 19–32)
CREATININE: 3.8 mg/dL — AB (ref 0.50–1.35)
Calcium: 8.5 mg/dL (ref 8.4–10.5)
GFR calc Af Amer: 21 mL/min — ABNORMAL LOW (ref >90)
GFR calc non Af Amer: 18 mL/min — ABNORMAL LOW (ref >90)
Glucose, Bld: 93 mg/dL (ref 70–99)
POTASSIUM: 4.3 mmol/L (ref 3.5–5.1)
SODIUM: 142 mmol/L (ref 135–145)

## 2014-02-10 LAB — WOUND CULTURE

## 2014-02-10 LAB — GLUCOSE, CAPILLARY
Glucose-Capillary: 123 mg/dL — ABNORMAL HIGH (ref 70–99)
Glucose-Capillary: 141 mg/dL — ABNORMAL HIGH (ref 70–99)
Glucose-Capillary: 159 mg/dL — ABNORMAL HIGH (ref 70–99)
Glucose-Capillary: 163 mg/dL — ABNORMAL HIGH (ref 70–99)
Glucose-Capillary: 85 mg/dL (ref 70–99)
Glucose-Capillary: 93 mg/dL (ref 70–99)

## 2014-02-10 LAB — POCT I-STAT 3, ART BLOOD GAS (G3+)
Acid-Base Excess: 15 mmol/L — ABNORMAL HIGH (ref 0.0–2.0)
Acid-Base Excess: 17 mmol/L — ABNORMAL HIGH (ref 0.0–2.0)
BICARBONATE: 40.2 meq/L — AB (ref 20.0–24.0)
BICARBONATE: 43.8 meq/L — AB (ref 20.0–24.0)
O2 Saturation: 95 %
O2 Saturation: 95 %
PCO2 ART: 68.1 mmHg — AB (ref 35.0–45.0)
Patient temperature: 102
TCO2: 42 mmol/L (ref 0–100)
TCO2: 46 mmol/L (ref 0–100)
pCO2 arterial: 56.7 mmHg — ABNORMAL HIGH (ref 35.0–45.0)
pH, Arterial: 7.466 — ABNORMAL HIGH (ref 7.350–7.450)
pH, Arterial: 7.422 (ref 7.350–7.450)
pO2, Arterial: 83 mmHg (ref 80.0–100.0)
pO2, Arterial: 81 mmHg (ref 80.0–100.0)

## 2014-02-10 LAB — POCT I-STAT 7, (LYTES, BLD GAS, ICA,H+H)
Acid-Base Excess: 7 mmol/L — ABNORMAL HIGH (ref 0.0–2.0)
Bicarbonate: 35.8 mEq/L — ABNORMAL HIGH (ref 20.0–24.0)
Calcium, Ion: 1.03 mmol/L — ABNORMAL LOW (ref 1.12–1.23)
HCT: 39 % (ref 39.0–52.0)
HEMOGLOBIN: 13.3 g/dL (ref 13.0–17.0)
O2 Saturation: 99 %
PCO2 ART: 66.3 mmHg — AB (ref 35.0–45.0)
PH ART: 7.336 — AB (ref 7.350–7.450)
Patient temperature: 36
Potassium: 4.2 mmol/L (ref 3.5–5.1)
Sodium: 135 mmol/L (ref 135–145)
TCO2: 38 mmol/L (ref 0–100)
pO2, Arterial: 134 mmHg — ABNORMAL HIGH (ref 80.0–100.0)

## 2014-02-10 LAB — URINE CULTURE: Colony Count: >=100000

## 2014-02-10 LAB — CBC WITH DIFFERENTIAL/PLATELET
Basophils Absolute: 0 10*3/uL (ref 0.0–0.1)
Basophils Relative: 0 % (ref 0–1)
Eosinophils Absolute: 0 10*3/uL (ref 0.0–0.7)
Eosinophils Relative: 0 % (ref 0–5)
HEMATOCRIT: 34.2 % — AB (ref 39.0–52.0)
Hemoglobin: 10.3 g/dL — ABNORMAL LOW (ref 13.0–17.0)
LYMPHS PCT: 6 % — AB (ref 12–46)
Lymphs Abs: 0.7 10*3/uL (ref 0.7–4.0)
MCH: 21.8 pg — ABNORMAL LOW (ref 26.0–34.0)
MCHC: 30.1 g/dL (ref 30.0–36.0)
MCV: 72.3 fL — ABNORMAL LOW (ref 78.0–100.0)
Monocytes Absolute: 1 10*3/uL (ref 0.1–1.0)
Monocytes Relative: 8 % (ref 3–12)
NEUTROS ABS: 10.5 10*3/uL — AB (ref 1.7–7.7)
NEUTROS PCT: 86 % — AB (ref 43–77)
Platelets: 245 10*3/uL (ref 150–400)
RBC: 4.73 MIL/uL (ref 4.22–5.81)
RDW: 17.7 % — AB (ref 11.5–15.5)
WBC: 12.2 10*3/uL — ABNORMAL HIGH (ref 4.0–10.5)

## 2014-02-10 LAB — PROCALCITONIN: PROCALCITONIN: 0.14 ng/mL

## 2014-02-10 LAB — C4 COMPLEMENT: COMPLEMENT C4, BODY FLUID: 18 mg/dL (ref 10–40)

## 2014-02-10 LAB — CORTISOL: Cortisol, Plasma: 89.6 ug/dL

## 2014-02-10 LAB — ANTISTREPTOLYSIN O TITER: ASO: 26 [IU]/mL (ref <409)

## 2014-02-10 LAB — VITAMIN D 25 HYDROXY (VIT D DEFICIENCY, FRACTURES): Vit D, 25-Hydroxy: 21.2 ng/mL — ABNORMAL LOW (ref 30.0–100.0)

## 2014-02-10 LAB — HEPARIN LEVEL (UNFRACTIONATED)
HEPARIN UNFRACTIONATED: 0.15 [IU]/mL — AB (ref 0.30–0.70)
Heparin Unfractionated: 0.33 IU/mL (ref 0.30–0.70)

## 2014-02-10 LAB — FERRITIN: Ferritin: 44 ng/mL (ref 22–322)

## 2014-02-10 LAB — MAGNESIUM: Magnesium: 2 mg/dL (ref 1.5–2.5)

## 2014-02-10 LAB — C3 COMPLEMENT: C3 Complement: 101 mg/dL (ref 90–180)

## 2014-02-10 LAB — TROPONIN I: Troponin I: 1.34 ng/mL (ref <0.031)

## 2014-02-10 LAB — PHOSPHORUS: PHOSPHORUS: 6.4 mg/dL — AB (ref 2.3–4.6)

## 2014-02-10 MED ORDER — HEPARIN BOLUS VIA INFUSION
3000.0000 [IU] | Freq: Once | INTRAVENOUS | Status: AC
  Administered 2014-02-10: 3000 [IU] via INTRAVENOUS
  Filled 2014-02-10: qty 3000

## 2014-02-10 MED ORDER — PRO-STAT SUGAR FREE PO LIQD
30.0000 mL | Freq: Two times a day (BID) | ORAL | Status: DC
  Administered 2014-02-10: 30 mL
  Filled 2014-02-10 (×3): qty 30

## 2014-02-10 MED ORDER — PANTOPRAZOLE SODIUM 40 MG PO PACK
40.0000 mg | PACK | Freq: Every day | ORAL | Status: DC
  Administered 2014-02-10: 40 mg
  Filled 2014-02-10 (×2): qty 20

## 2014-02-10 MED ORDER — VITAL HIGH PROTEIN PO LIQD
1000.0000 mL | ORAL | Status: DC
  Filled 2014-02-10 (×3): qty 1000

## 2014-02-10 MED ORDER — PIPERACILLIN-TAZOBACTAM 3.375 G IVPB
3.3750 g | Freq: Three times a day (TID) | INTRAVENOUS | Status: DC
  Administered 2014-02-10 – 2014-02-11 (×3): 3.375 g via INTRAVENOUS
  Filled 2014-02-10 (×5): qty 50

## 2014-02-10 MED ORDER — HEPARIN SODIUM (PORCINE) 5000 UNIT/ML IJ SOLN
5000.0000 [IU] | Freq: Three times a day (TID) | INTRAMUSCULAR | Status: DC
  Administered 2014-02-10 – 2014-02-15 (×15): 5000 [IU] via SUBCUTANEOUS
  Filled 2014-02-10 (×15): qty 1

## 2014-02-10 MED ORDER — PERFLUTREN LIPID MICROSPHERE
1.0000 mL | INTRAVENOUS | Status: AC | PRN
  Administered 2014-02-10: 4 mL via INTRAVENOUS
  Filled 2014-02-10: qty 10

## 2014-02-10 MED ORDER — FUROSEMIDE 10 MG/ML IJ SOLN
60.0000 mg | Freq: Once | INTRAMUSCULAR | Status: AC
  Administered 2014-02-10: 60 mg via INTRAVENOUS
  Filled 2014-02-10: qty 6

## 2014-02-10 NOTE — Progress Notes (Signed)
Patient ID: Dean Bowers, male   DOB: [birth year], 44 y.o.   MRN: [id number]  [facility] Progress Note    Assessment/ Plan:   1. Acute renal failure: Suspected to be hemodynamically mediated with sepsis/hypotension in the setting of ACE inhibitor-possibly evolving into ATN. With polyuric urine output and slow renal recovery. No indications for dialysis at this time-remain significantly volume overloaded 2. Cellulitis/drainage from right BKA stump: Status post incision and drainage yesterday. Remains on antibiotic therapy with vancomycin and Zosyn. 3. Anemia: Likely anemia of chronic disease, continue to monitor particularly for postoperative drops/PRBC triggers. 4. Anasarca: Remains polyuric of diuretic therapy-continue to monitor.  Subjective:   No acute events overnight-indicates that he is comfortable    Objective:   BP 98/80 mmHg  Pulse 97  Temp(Src) 101.8 F (38.8 C) (Core (Comment))  Resp 24  Ht 5\' 10"  (1.778 m)  Wt 185.521 kg (409 lb)  BMI 58.69 kg/m2  SpO2 97%  Intake/Output Summary (Last 24 hours) at 02/10/14 0823 Last data filed at 02/10/14 0700  Gross per 24 hour  Intake 2594.37 ml  Output   9700 ml  Net -7105.63 ml   Weight change: -0.454 kg (-1 lb)  Physical Exam: Gen: Comfortable resting on the vent-awake and alert CVS: Pulse regular in rate and rhythm, S1 and S2 normal Resp: Clear to auscultation, no rales Abd: Soft, obese, nontender Ext: 3+ lower extremity edema  Imaging: US Abdomen Port  02/08/2014   CLINICAL DATA:  Transaminitis.  Acute renal failure.  EXAM: ULTRASOUND PORTABLE ABDOMEN  COMPARISON:  CT scan abdomen dated 06/17/2013  FINDINGS: Gallbladder: No gallstones or wall thickening visualized. No sonographic Murphy sign noted.  Common bile duct: Diameter: 6.2 mm, normal.  Liver: No focal lesion identified. Within normal limits in parenchymal echogenicity.  IVC: No abnormality visualized.  Pancreas: Visualized portion unremarkable.   Spleen: Normal.  10.5 cm in length.  Right Kidney: Length: 13.9 cm. Echogenicity within normal limits. No mass or hydronephrosis visualized.  Left Kidney: Length: 12.3 cm. 7 mm stone in the mid left kidney. Echogenicity within normal limits. No mass or hydronephrosis visualized.  Abdominal aorta: 2.4 cm maximal diameter.  Other findings: None.  IMPRESSION: No acute abnormalities. 7 mm stone in the mid left kidney. No hydronephrosis.   Electronically Signed   By: Rozetta Nunnery M.D.   On: 02/08/2014 09:39   Dg Chest Port 1 View  02/10/2014   CLINICAL DATA:  Acute respiratory failure  EXAM: PORTABLE CHEST - 1 VIEW  COMPARISON:  02/09/2014  FINDINGS: Endotracheal tube is again noted 4 cm above the carina. A nasogastric catheter courses into the stomach. A left jugular central line is again noted in the left innominate vein. Cardiac shadow remains enlarged. Vascular congestion parenchymal edema is again identified bilaterally. Given some technical variations in the film the overall appearance is stable. No new focal infiltrates are seen.  IMPRESSION: Stable pulmonary edema.  No new focal abnormality is noted.   Electronically Signed   By: Inez Catalina M.D.   On: 02/10/2014 07:28   Dg Chest Port 1 View  02/09/2014   CLINICAL DATA:  Ventilator dependent respiratory failure. Follow-up pulmonary edema.  EXAM: PORTABLE CHEST - 1 VIEW  COMPARISON:  Portable chest x-rays yesterday. Two-view chest x-ray 02/07/2014, 01/28/2014, 07/14/2012.  FINDINGS: Endotracheal tube tip in satisfactory position projecting approximately 6 cm above the carina. Left jugular central venous catheter tip projects over the left innominate vein. Nasogastric tube courses below the diaphragm into  the stomach though its tip is not visible. Cardiac silhouette moderately enlarged but stable. Interstitial pulmonary edema improved since the examination is yesterday, though pulmonary venous hypertension and minimal edema persists. Interval dense  consolidation in the left lower lobe. No new pulmonary parenchymal abnormalities elsewhere.  IMPRESSION: Support apparatus satisfactory. Improved pulmonary edema, though minimal edema persists. New dense left lower lobe atelectasis.   Electronically Signed   By: Evangeline Dakin M.D.   On: 02/09/2014 11:07   Dg Abd Portable 1v  02/09/2014   CLINICAL DATA:  Nasogastric tube placement.  EXAM: PORTABLE ABDOMEN - 1 VIEW  COMPARISON:  02/08/2013.  FINDINGS: Exam is technically degraded by underpenetration (portable technique and obese body habitus). There is an enteric tube that follows the contour the stomach and the tip is in the duodenum at the junction of the second and third parts of the duodenum. Spinal fixation hardware noted.  IMPRESSION: Enteric tube tip at the junction of the second and third parts of the duodenum, advanced compared to prior.   Electronically Signed   By: Dereck Ligas M.D.   On: 02/09/2014 10:15   Ct Extrem Lower Wo Cm Bil  02/08/2014   CLINICAL DATA:  Draining wound from a right BKA stump. Diffuse cellulitis and both lower extremities.  EXAM: CT OF THE LOWER BILATERAL EXTREMITY WITHOUT CONTRAST  TECHNIQUE: Multidetector CT imaging of the bilateral lower extremities was performed according to the standard protocol.  COMPARISON:  Radiographs 02/07/2014  FINDINGS: There is diffuse subcutaneous soft tissue swelling/ edema and skin thickening involving both lower extremities suggesting cellulitis. No definite findings for myofasciitis. There is an intra medullary rod in the right femur with reactive changes but no definite findings for osteomyelitis. There is an open wound at the right BKA stump with fluid and air noted in the subcutaneous fat surrounding the bony structures. This is consistent with an abscess. I do not see any definite destructive bony changes but the air and fluid both right to the bone and osteomyelitis is likely.  The left lower extremity does not demonstrate any  definite changes of septic arthritis or osteomyelitis.  IMPRESSION: Bilateral lower extremity cellulitis.  No findings for myofasciitis.  Fluid and air surrounding the right BKA stump consistent with an abscess. No definite destructive bony changes but osteomyelitis is likely.   Electronically Signed   By: Kalman Jewels M.D.   On: 02/08/2014 12:54    Labs: BMET  Recent Labs Lab 02/03/14 1229 02/07/14 1813 02/07/14 1826 02/08/14 0620 02/08/14 1730 02/09/14 0445 02/10/14 0428  NA 140 133* 135 135 136 137 140  K 3.9 4.2 4.2 5.0 4.0 4.2 4.3  CL 95* 92* 95* 93* 96 93* 94*  CO2 33* 28  --  30 30 29  34*  GLUCOSE 113* 85 89 137* 107* 125* 154*  BUN 23 51* 51* 53* 59* 58* 58*  CREATININE 0.94 5.16* 4.70* 6.17* 6.25* 6.13* 4.58*  CALCIUM 8.6 8.0*  --  7.6* 7.8* 7.7* 8.2*  PHOS  --   --   --   --   --  8.7*  --    CBC  Recent Labs Lab 02/07/14 1930 02/08/14 0620 02/09/14 0445 02/10/14 0428  WBC 14.2* 13.9* 13.6* 12.2*  NEUTROABS 11.1*  --   --  10.5*  HGB 10.8* 9.9* 10.3* 10.3*  HCT 37.7* 36.8* 35.3* 34.2*  MCV 75.4* 78.1 71.9* 72.3*  PLT 266 282 255 245    Medications:    . antiseptic oral rinse  7 mL Mouth Rinse QID  . aspirin  325 mg Oral Daily  . budesonide  0.25 mg Nebulization 4 times per day  . chlorhexidine  15 mL Mouth Rinse BID  . feeding supplement (PRO-STAT SUGAR FREE 64)  60 mL Per Tube TID  . feeding supplement (VITAL HIGH PROTEIN)  1,000 mL Per Tube Q24H  . hydrocortisone sod succinate (SOLU-CORTEF) inj  50 mg Intravenous Q6H  . insulin aspart  0-20 Units Subcutaneous 6 times per day  . ipratropium-albuterol  3 mL Nebulization Q6H  . multivitamin with minerals  1 tablet Oral Daily  . pantoprazole sodium  40 mg Per Tube Daily  . piperacillin-tazobactam (ZOSYN)  IV  2.25 g Intravenous Q8H  . sodium chloride  3 mL Intravenous Q12H  . vancomycin  2,000 mg Intravenous Q48H   Elmarie Shiley, MD 02/10/2014, 8:23 AM

## 2014-02-10 NOTE — Progress Notes (Signed)
NUTRITION FOLLOW UP  Intervention:    Increase Vital High Protein to goal rate of 70 ml/h with Prostat 30 ml BID to provide 1880 kcals (~60% of estimated needs), 177 gm protein, 1404 ml free water daily.  Nutrition Dx:   Inadequate oral intake related to inability to eat as evidenced by NPO status, ongoing.  Goal:   Enteral nutrition to provide 60-70% of estimated calorie needs (22-25 kcals/kg ideal body weight) and 100% of estimated protein needs, based on ASPEN guidelines for hypocaloric, high protein feeding in critically ill obese individuals, unmet.  Monitor:   TF tolerance/adequacy, weight trend, labs, vent status.  Assessment:   44 year old male with a history of hypertension, hyperlipidemia, morbid obesity, right BKA, polysubstance abuse presents with decreased urine output for 1-1/2 weeks. Also with abscess to right BKA stump.  Patient is currently receiving TF via OGT with Vital High Protein at 20 ml/h with Prostat 60 ml TID providing 1080 kcals, 132 gm protein, 401 ml free water daily.   Propofol is now off, can adjust TF rate to better meet nutrition goals.  Patient is currently intubated on ventilator support MV: 9.9 L/min Temp (24hrs), Avg:101.2 F (38.4 C), Min:100.1 F (37.8 C), Max:101.9 F (38.8 C)  Propofol: off  Height: Ht Readings from Last 1 Encounters:  02/08/14 5\' 10"  (1.778 m)    Weight Status:   Wt Readings from Last 1 Encounters:  02/10/14 409 lb (185.521 kg)   02/08/14 423 lb 4.5 oz (192 kg)    Re-estimated needs:  Kcal: 3200 Protein: 175 gm Fluid: 2.5 L  Skin: no issues  Diet Order: Diet NPO time specified   Intake/Output Summary (Last 24 hours) at 02/10/14 1314 Last data filed at 02/10/14 1000  Gross per 24 hour  Intake 2594.33 ml  Output   8450 ml  Net -5855.67 ml    Last BM: 1/2   Labs:   Recent Labs Lab 02/08/14 1730 02/09/14 0445 02/09/14 1241 02/10/14 0428  NA 136 137 135 140  K 4.0 4.2 4.2 4.3  CL 96 93*   --  94*  CO2 30 29  --  34*  BUN 59* 58*  --  58*  CREATININE 6.25* 6.13*  --  4.58*  CALCIUM 7.8* 7.7*  --  8.2*  PHOS  --  8.7*  --   --   GLUCOSE 107* 125*  --  154*    CBG (last 3)   Recent Labs  02/10/14 0422 02/10/14 0824 02/10/14 1156  GLUCAP 159* 141* 123*    Scheduled Meds: . antiseptic oral rinse  7 mL Mouth Rinse QID  . aspirin  325 mg Oral Daily  . budesonide  0.25 mg Nebulization 4 times per day  . chlorhexidine  15 mL Mouth Rinse BID  . feeding supplement (PRO-STAT SUGAR FREE 64)  60 mL Per Tube TID  . feeding supplement (VITAL HIGH PROTEIN)  1,000 mL Per Tube Q24H  . insulin aspart  0-20 Units Subcutaneous 6 times per day  . ipratropium-albuterol  3 mL Nebulization Q6H  . multivitamin with minerals  1 tablet Oral Daily  . pantoprazole sodium  40 mg Per Tube Daily  . piperacillin-tazobactam (ZOSYN)  IV  3.375 g Intravenous Q8H  . sodium chloride  3 mL Intravenous Q12H  . vancomycin  2,000 mg Intravenous Q48H    Continuous Infusions: . fentaNYL infusion INTRAVENOUS 300 mcg/hr (02/10/14 0606)  . heparin 2,150 Units/hr (02/10/14 0501)  . norepinephrine (LEVOPHED) Adult infusion 4 mcg/min (  02/10/14 0505)  . vasopressin (PITRESSIN) infusion - *FOR SHOCK* Stopped (02/10/14 0950)    Molli Barrows, RD, LDN, [place] Pager [phone] After Hours Pager [phone]
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
## 2014-02-10 NOTE — Procedures (Signed)
Extubation Procedure Note  Patient Details:   Name: Dean Bowers DOB: 02/22/1970 MRN: [id number]   Airway Documentation:  AIRWAYS (Active)     Airway 8 mm (Active)  Secured at (cm) 24 cm 02/10/2014 12:40 PM  Measured From Lips 02/10/2014 12:40 PM  Secured Location Right 02/10/2014 12:40 PM  Secured By [employer] 02/10/2014 12:40 PM  Tube Holder Repositioned Yes 02/10/2014 12:40 PM  Cuff Pressure (cm H2O) 26 cm H2O 02/10/2014  3:33 AM  Site Condition Dry 02/10/2014 12:40 PM    Evaluation  O2 sats: stable throughout Complications: No apparent complications Patient did tolerate procedure well. Bilateral Breath Sounds: Rhonchi Suctioning: Airway Yes   Pt is coherent and able to say name and place.    Beatris Si D 02/10/2014, 4:23 PM

## 2014-02-10 NOTE — Progress Notes (Signed)
Echocardiogram 2D Echocardiogram with Definity has been performed.  Dean Bowers 02/10/2014, 1:31 PM
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
## 2014-02-10 NOTE — Progress Notes (Signed)
Orthopaedic Trauma Service Progress Note  Subjective  Doing well after surgery  Wants ET tube out   Review of Systems  Cardiovascular: Negative for chest pain.  All other systems reviewed and are negative.    Objective   BP 117/50 mmHg  Pulse 84  Temp(Src) 101.8 F (38.8 C) (Core (Comment))  Resp 24  Ht 5\' 10"  (1.778 m)  Wt 185.521 kg (409 lb)  BMI 58.69 kg/m2  SpO2 97%  Intake/Output      01/05 0701 - 01/06 0700 01/06 0701 - 01/07 0700   I.V. (mL/kg) 1760.4 (9.5) 76.6 (0.4)   NG/GT 340.7 20   IV Piggyback 650    Total Intake(mL/kg) 2751 (14.8) 96.6 (0.5)   Urine (mL/kg/hr) 9850 (2.2) 275 (0.4)   Emesis/NG output 600 (0.1)    Blood 75 (0)    Total Output 10525 275   Net -7774 -178.4          Labs  Cultures (intra-op): no growth to date   Exam  HYQ:MVHQIONGE but awake  Ext:       Right Lower Extremity   Dressing c/d/i  No acute changes   Assessment and Plan   POD/HD#: 1   44 y/o male admitted for Respiratory failure, AKI, abscess R BKA stump  1. R BKA abscess   No growth to date  Stump looked very good intra-op  No abscess  Unlikely source for pts medical problems   2. Medical issues  Per CCM, Renal  3. Dispo  Per primary service  Will change dressing on Friday      Jari Pigg, PA-C [facility] [phone] (O) 02/10/2014 10:32 AM

## 2014-02-10 NOTE — Progress Notes (Signed)
Pt extubated at 31 [place] at  4L. Vitals are HR 111 O2 94 BP 107/37. A&Ox4 no signs of distress

## 2014-02-11 ENCOUNTER — Encounter (HOSPITAL_COMMUNITY): Payer: Self-pay | Admitting: Orthopedic Surgery

## 2014-02-11 DIAGNOSIS — R5381 Other malaise: Secondary | ICD-10-CM

## 2014-02-11 DIAGNOSIS — L03115 Cellulitis of right lower limb: Secondary | ICD-10-CM

## 2014-02-11 DIAGNOSIS — J9602 Acute respiratory failure with hypercapnia: Secondary | ICD-10-CM

## 2014-02-11 LAB — BLOOD GAS, ARTERIAL
ACID-BASE EXCESS: 15.1 mmol/L — AB (ref 0.0–2.0)
BICARBONATE: 41.3 meq/L — AB (ref 20.0–24.0)
Drawn by: 369891
FIO2: 0.36 %
O2 SAT: 84.3 %
PO2 ART: 53.2 mmHg — AB (ref 80.0–100.0)
Patient temperature: 98.6
TCO2: 43.6 mmol/L (ref 0–100)
pCO2 arterial: 76.3 mmHg (ref 35.0–45.0)
pH, Arterial: 7.353 (ref 7.350–7.450)

## 2014-02-11 LAB — GLUCOSE, CAPILLARY
GLUCOSE-CAPILLARY: 96 mg/dL (ref 70–99)
GLUCOSE-CAPILLARY: 129 mg/dL — AB (ref 70–99)
Glucose-Capillary: 121 mg/dL — ABNORMAL HIGH (ref 70–99)
Glucose-Capillary: 131 mg/dL — ABNORMAL HIGH (ref 70–99)
Glucose-Capillary: 135 mg/dL — ABNORMAL HIGH (ref 70–99)
Glucose-Capillary: 163 mg/dL — ABNORMAL HIGH (ref 70–99)

## 2014-02-11 LAB — CBC WITH DIFFERENTIAL/PLATELET
BASOS PCT: 0 % (ref 0–1)
Basophils Absolute: 0.1 10*3/uL (ref 0.0–0.1)
EOS ABS: 0.2 10*3/uL (ref 0.0–0.7)
Eosinophils Relative: 1 % (ref 0–5)
HEMATOCRIT: 34 % — AB (ref 39.0–52.0)
Hemoglobin: 9.8 g/dL — ABNORMAL LOW (ref 13.0–17.0)
Lymphocytes Relative: 21 % (ref 12–46)
Lymphs Abs: 2.5 10*3/uL (ref 0.7–4.0)
MCH: 21.7 pg — ABNORMAL LOW (ref 26.0–34.0)
MCHC: 28.8 g/dL — ABNORMAL LOW (ref 30.0–36.0)
MCV: 75.2 fL — ABNORMAL LOW (ref 78.0–100.0)
Monocytes Absolute: 1.4 10*3/uL — ABNORMAL HIGH (ref 0.1–1.0)
Monocytes Relative: 12 % (ref 3–12)
NEUTROS ABS: 7.9 10*3/uL — AB (ref 1.7–7.7)
NEUTROS PCT: 66 % (ref 43–77)
Platelets: 232 10*3/uL (ref 150–400)
RBC: 4.52 MIL/uL (ref 4.22–5.81)
RDW: 17.9 % — ABNORMAL HIGH (ref 11.5–15.5)
WBC: 12.1 10*3/uL — ABNORMAL HIGH (ref 4.0–10.5)

## 2014-02-11 LAB — BASIC METABOLIC PANEL
Anion gap: 10 (ref 5–15)
Anion gap: 11 (ref 5–15)
BUN: 54 mg/dL — AB (ref 6–23)
BUN: 45 mg/dL — ABNORMAL HIGH (ref 6–23)
CALCIUM: 8.3 mg/dL — AB (ref 8.4–10.5)
CALCIUM: 8.5 mg/dL (ref 8.4–10.5)
CO2: 39 mmol/L — ABNORMAL HIGH (ref 19–32)
CO2: 37 mmol/L — AB (ref 19–32)
CREATININE: 3.09 mg/dL — AB (ref 0.50–1.35)
Chloride: 91 mEq/L — ABNORMAL LOW (ref 96–112)
Chloride: 90 mEq/L — ABNORMAL LOW (ref 96–112)
Creatinine, Ser: 2.56 mg/dL — ABNORMAL HIGH (ref 0.50–1.35)
GFR calc Af Amer: 33 mL/min — ABNORMAL LOW (ref >90)
GFR, EST AFRICAN AMERICAN: 27 mL/min — AB (ref >90)
GFR, EST NON AFRICAN AMERICAN: 23 mL/min — AB (ref >90)
GFR, EST NON AFRICAN AMERICAN: 29 mL/min — AB (ref >90)
GLUCOSE: 103 mg/dL — AB (ref 70–99)
GLUCOSE: 132 mg/dL — AB (ref 70–99)
Potassium: 3.4 mmol/L — ABNORMAL LOW (ref 3.5–5.1)
Potassium: 4.2 mmol/L (ref 3.5–5.1)
Sodium: 140 mmol/L (ref 135–145)
Sodium: 138 mmol/L (ref 135–145)

## 2014-02-11 LAB — MAGNESIUM
Magnesium: 1.9 mg/dL (ref 1.5–2.5)
Magnesium: 2.1 mg/dL (ref 1.5–2.5)

## 2014-02-11 LAB — TRIGLYCERIDES: Triglycerides: 121 mg/dL (ref <150)

## 2014-02-11 LAB — PHOSPHORUS
PHOSPHORUS: 6.2 mg/dL — AB (ref 2.3–4.6)
Phosphorus: 4.4 mg/dL (ref 2.3–4.6)

## 2014-02-11 MED ORDER — CEFTRIAXONE SODIUM IN DEXTROSE 20 MG/ML IV SOLN
1.0000 g | INTRAVENOUS | Status: DC
  Administered 2014-02-11 – 2014-02-13 (×3): 1 g via INTRAVENOUS
  Filled 2014-02-11 (×3): qty 50

## 2014-02-11 MED ORDER — LIVING WELL WITH DIABETES BOOK
Freq: Once | Status: AC
Start: 2014-02-11 — End: 2014-02-11
  Administered 2014-02-11: 14:00:00
  Filled 2014-02-11: qty 1

## 2014-02-11 MED ORDER — SODIUM CHLORIDE 0.9 % IV SOLN
INTRAVENOUS | Status: DC
  Administered 2014-02-12 – 2014-02-13 (×2): via INTRAVENOUS

## 2014-02-11 MED ORDER — ASPIRIN EC 81 MG PO TBEC
81.0000 mg | DELAYED_RELEASE_TABLET | Freq: Every day | ORAL | Status: DC
  Administered 2014-02-12 – 2014-02-15 (×4): 81 mg via ORAL
  Filled 2014-02-11 (×5): qty 1

## 2014-02-11 MED ORDER — FUROSEMIDE 10 MG/ML IJ SOLN
60.0000 mg | Freq: Once | INTRAMUSCULAR | Status: AC
  Administered 2014-02-11: 60 mg via INTRAVENOUS
  Filled 2014-02-11: qty 6

## 2014-02-11 MED ORDER — PANTOPRAZOLE SODIUM 40 MG PO TBEC
40.0000 mg | DELAYED_RELEASE_TABLET | Freq: Every day | ORAL | Status: DC
  Administered 2014-02-11: 40 mg via ORAL
  Filled 2014-02-11: qty 1

## 2014-02-11 MED ORDER — INSULIN ASPART 100 UNIT/ML ~~LOC~~ SOLN
0.0000 [IU] | Freq: Three times a day (TID) | SUBCUTANEOUS | Status: DC
  Administered 2014-02-11 (×2): 1 [IU] via SUBCUTANEOUS
  Administered 2014-02-12: 2 [IU] via SUBCUTANEOUS
  Administered 2014-02-13 – 2014-02-14 (×3): 1 [IU] via SUBCUTANEOUS

## 2014-02-11 MED ORDER — MAGNESIUM SULFATE 2 GM/50ML IV SOLN
2.0000 g | Freq: Once | INTRAVENOUS | Status: AC
  Administered 2014-02-11: 2 g via INTRAVENOUS
  Filled 2014-02-11: qty 50

## 2014-02-11 MED ORDER — POTASSIUM CHLORIDE CRYS ER 20 MEQ PO TBCR
40.0000 meq | EXTENDED_RELEASE_TABLET | ORAL | Status: AC
  Administered 2014-02-11 (×2): 40 meq via ORAL
  Filled 2014-02-11 (×2): qty 2

## 2014-02-11 NOTE — Progress Notes (Signed)
UR Completed.  [phone]  

## 2014-02-11 NOTE — Progress Notes (Signed)
Patient back in bed.

## 2014-02-11 NOTE — Progress Notes (Addendum)
PULMONARY / CRITICAL CARE MEDICINE   Name: Dean Bowers MRN: [id number] DOB: [birth year]    ADMISSION DATE:  02/07/2014 CONSULTATION DATE:  02/08/14  REFERRING MD :  Tat ([facility])   CHIEF COMPLAINT:  Respiratory failure   INITIAL PRESENTATION: 44yo morbidly obese male active smoker with hx HTN, polysubstance abuse, R BKA presented 1/3 with decreased UOP, fluid retention and drainage from R BKA stump. Admitted by [facility] at [facility] with acute renal failure with SCr >5, cellulitis of R stump and sepsis.  Overnight had progressive AMS and ABG revealed respiratory acidosis with pH 7.09/ PCo2 >100, tx to ICU and PCCM consulted.   STUDIES:  CT head 1/3>>> neg acute  Abd u/s 1/4>>> 73mm stone in mid L kidney. No hydronephrosis pABD xray 1/4>>>Nasogastric tube tip lies in the stomach along the greater curvature pCXR 1/4>>> Persistent cardiac enlargement and pulmonary vascular congestion. R Knee Xray 1/4>>>Gas within the soft tissues of the anterolateral aspect of the stump, extending deep to the level of the tibia where there may be early changes of osteomyelitis CT lower extremities 1/4>>>Bilateral lower extremity cellulitis. No findings for myofasciitis. Fluid and air surrounding the right BKA stump consistent with an abscess. No definite destructive bony changes but osteomyelitis is likely. B/L lower extremity venous duplex 1/4--Preliminary report: No obvious evidence of DVT, superficial thrombosis, or Baker's Cyst. Technically limited by body habitus. pCXR 1/6>>>Stable pulmonary edema. No new focal abnormality is noted.  Lines: OETT 1/4>>>1/6 NGT 1/4>>> LIJ 1/4>>> Foley 1/4>>>  Cultures: Blood cx x2 1/3>>>NGTD Urine cx 1/4>>>100,000 colonies, Ecoli Wound cx R stump 1/3>>> Wound cx's from OR 1/5>>>No organism seen on tissue gram stain  Abx: Vanc 1/4>>> Zosyn 1/4>>>  SIGNIFICANT EVENTS: 1/4: Admit for acute renal failure Cr >5, cellulitis of R stump and sepsis.  AMS and respiratory  acidosis overnight. Intubated and transferred to [facility] for Ortho surgery 1/5 (I&D) 1/5: I&D by ortho in OR, NOT THE SOURCE 1/5: discover use of wt loss agent with concerning SE 1/6: Extubated, febrile  SUBJECTIVE:  Extubated, doing well, feeling better with some mild right stump pain. +BM and passing gas  VITAL SIGNS: Temp:  [98.7 F (37.1 C)-101.8 F (38.8 C)] 98.7 F (37.1 C) (01/07 0800) Pulse Rate:  [75-120] 89 (01/07 0800) Resp:  [13-83] 17 (01/07 0800) BP: (63-128)/(20-92) 70/31 mmHg (01/07 0800) SpO2:  [93 %-100 %] 97 % (01/07 0803) Arterial Line BP: (82-126)/(45-74) 107/50 mmHg (01/06 1730) FiO2 (%):  [40 %-60 %] 40 % (01/06 1400) Weight:  [397 lb (180.078 kg)] 397 lb (180.078 kg) (01/07 0600) HEMODYNAMICS: CVP:  [17 mmHg] 17 mmHg VENTILATOR SETTINGS: Vent Mode:  [-] PSV;CPAP FiO2 (%):  [40 %-60 %] 40 % Set Rate:  [20 bmp] 20 bmp Vt Set:  [580 mL] 580 mL PEEP:  [5 cmH20] 5 cmH20 Pressure Support:  [5 cmH20] 5 cmH20 Plateau Pressure:  [24 cmH20] 24 cmH20 INTAKE / OUTPUT:  Intake/Output Summary (Last 24 hours) at 02/11/14 0816 Last data filed at 02/11/14 0700  Gross per 24 hour  Intake 803.54 ml  Output   4625 ml  Net -3821.46 ml   PHYSICAL EXAMINATION: General:  Morbidly obese male, anasarca improving Neuro:  AAOX3 HEENT:  EOMI Cardiovascular:  RRR, distant heart sounds due to body habitus Lungs: b/l wheezing diffuse Abdomen:  Obese, soft, erythematous lower abdomen--slightly improved Musculoskeletal:  Warm and dry, R BKA stump with dressing in place, LLE erythema improving Skin: Warm to touch  LABS:  CBC  Recent Labs Lab 02/09/14  7619 02/09/14 1241 02/10/14 0428 02/11/14 0400  WBC 13.6*  --  12.2* 12.1*  HGB 10.3* 13.3 10.3* 9.8*  HCT 35.3* 39.0 34.2* 34.0*  PLT 255  --  245 232   Coag's  Recent Labs Lab 02/07/14 1813  APTT 23*  INR 1.02   BMET  Recent Labs Lab 02/10/14 0428 02/10/14 1730 02/11/14 0400  NA 140 142 140  K 4.3 4.3  3.4*  CL 94* 94* 91*  CO2 34* 37* 39*  BUN 58* 62* 54*  CREATININE 4.58* 3.80* 3.09*  GLUCOSE 154* 93 103*   Electrolytes  Recent Labs Lab 02/09/14 0445 02/10/14 0428 02/10/14 1730 02/11/14 0400  CALCIUM 7.7* 8.2* 8.5 8.3*  MG  --   --  2.0 1.9  PHOS 8.7*  --  6.4* 6.2*   Sepsis Markers  Recent Labs Lab 02/07/14 2350 02/08/14 0620 02/10/14 0428  LATICACIDVEN 1.3 0.8  --   PROCALCITON  --  0.26 0.14   ABG  Recent Labs Lab 02/10/14 0812 02/10/14 1544 02/11/14 0416  PHART 7.466* 7.422 7.353  PCO2ART 56.7* 68.1* 76.3*  PO2ART 83.0 81.0 53.2*   Liver Enzymes  Recent Labs Lab 02/08/14 0620 02/09/14 0445 02/10/14 0428  AST 243* 111* 63*  ALT 365* 277* 184*  ALKPHOS 61 65 56  BILITOT 0.8 0.8 1.3*  ALBUMIN 3.0* 2.8* 2.7*   Cardiac Enzymes  Recent Labs Lab 02/09/14 1830 02/09/14 2228 02/10/14 0428  TROPONINI 1.42* 1.36* 1.34*   Glucose  Recent Labs Lab 02/10/14 0824 02/10/14 1156 02/10/14 1555 02/10/14 1944 02/11/14 0012 02/11/14 0420  GLUCAP 141* 123* 93 85 121* 96   Imaging Dg Chest Port 1 View  02/10/2014   CLINICAL DATA:  Acute respiratory failure  EXAM: PORTABLE CHEST - 1 VIEW  COMPARISON:  02/09/2014  FINDINGS: Endotracheal tube is again noted 4 cm above the carina. A nasogastric catheter courses into the stomach. A left jugular central line is again noted in the left innominate vein. Cardiac shadow remains enlarged. Vascular congestion parenchymal edema is again identified bilaterally. Given some technical variations in the film the overall appearance is stable. No new focal infiltrates are seen.  IMPRESSION: Stable pulmonary edema.  No new focal abnormality is noted.   Electronically Signed   By: Inez Catalina M.D.   On: 02/10/2014 07:28   ASSESSMENT / PLAN:  PULMONARY OETT 1/4>>1/6 Acute respiratory failure--extubated 1/6, wheezing today Chronic respiratory acidosis: ABG this AM 7.35/76.3/53.2/41.3/84.3% ? OSA/OHS  Likely edema  P:    On [place] O2, weaning down BD's - duoneb, pulmicort (home rx breo) bid Lasix Will use noctrunal cpap, goal dc on this , and ssee pccm leb for study  CARDIOVASCULAR Hx HTN  Hx CAD  Septic shock--off pressors. BP improved but slightly lower today ACS/NSTEMI--trop trended down from 1.4-->1.34, new TWI lateral inferior leads (leads III, aVF, V3-V5)--off heparin gtt 1/6 cortisol 89, no rel  AI TTE poor quality, LV systolic function probably normal. Unable to assess regional wall motion CVP 17  P:  Holding home Anti-HTN  TTE to eval L and R heart fxn Dc CVP Lasix remain Needs cards evaluation pre dc for risk stratification Keep tele  RENAL Acute renal failure likely multifactorial including ATN in setting of sepsis, hypotension and on ACEi and NSAIDs.  Could also be secondary to weight loss supplement Qsymia--Cr improving Anasarca--improving  Recent Labs Lab 02/08/14 1730 02/09/14 0445 02/10/14 0428 02/10/14 1730 02/11/14 0400  CREATININE 6.25* 6.13* 4.58* 3.80* 3.09*   P:  Net neg 14L since admission  Responding to diuresis Renal signed off--will give more lasix today Bmet mag, phos q12h  Replace K and Mag  GASTROINTESTINAL Morbid obesity--was apparently taking weight loss pill Osymia but patient denies today.  Elevated transaminases--trending down but T bili up to 1.3 P:   PPI--d/c  Carb mod diet  HEMATOLOGIC Leukocytosis--trending down Microcytic anemia, ferritin 44 P:  SQ heparin  PT   INFECTIOUS S/p I&D R stump in OR 1/5 Septic shock likely 2/2 UTI and R stump cellulitis and likely osteomyelitis, LLE cellulitis--Resolved, no longer febrile.  IV abx day 4 Pct trending down cellultitis  P:   BCx2 1/3>>>NGTD UC 1/4>>>+Ecoli Wound cx's from OR R stump 1/5>>>  Vanc 1/4>>1/7 Zosyn 1/4>>>1/7  Ortho following but was not source Will treat cellulitis - ceftriaxone  ENDOCRINE DM2--A1C 6.5 on admission--new diagnosis NO AI P:   Carb mod diet if  tolerated  NEUROLOGIC AMS - r/t hypercarbia, resp failure, sepsis--Resolved. Polysubstance abuse--UDS positive for benzodiazepines and cocaine  Transfer to med/surg, TRH  FAMILY  - Updates:  Reviewed status and plans with pt's wife 1/7  - Inter-disciplinary family meet or Palliative Care meeting due by:  1/11  Signed: Jerene Pitch, MD PGY-3, Internal Medicine Resident Pager: [phone]  02/11/2014,8:16 AM  STAFF NOTE: Linwood Dibbles, MD FACP have personally reviewed patient's available data, including medical history, events of note, physical examination and test results as part of my evaluation. I have discussed with resident/NP and other care providers such as pharmacist, RN and RRT. In addition, I personally evaluated patient and elicited key findings of: no fever, transition off abx to ceftraixone , PT, CIR needed , needs cards eval pre dc, keep tele, lasix redose. Add nocturnal cpap, needs sleelp doc post dc  Lavon Paganini. Titus Mould, MD, [place] Pgr: [facility] 02/11/2014 10:35 AM

## 2014-02-11 NOTE — Progress Notes (Signed)
ANTIBIOTIC CONSULT NOTE - FOLLOW UP  Pharmacy Consult for Ceftriaxone  Indication: Cellulitis/UTI     Allergies  Allergen Reactions  . Lyrica [Pregabalin] Other (See Comments)    Hallucinations     Patient Measurements: Height: 5\' 10"  (177.8 cm) Weight: (!) 397 lb (180.078 kg) IBW/kg (Calculated) : 73  Vital Signs: Temp: 98.4 F (36.9 C) (01/07 0900) Temp Source: Core (Comment) (01/07 0400) BP: 98/44 mmHg (01/07 0900) Pulse Rate: 92 (01/07 0900) Intake/Output from previous day: 01/06 0701 - 01/07 0700 In: 890.1 [I.V.:660.1; NG/GT:80; IV Piggyback:150] Out: 4900 [Urine:4900] Intake/Output from this shift: Total I/O In: 200 [P.O.:200] Out: 475 [Urine:475]  Labs:  Recent Labs  02/08/14 2046  02/09/14 0445 02/09/14 1241 02/10/14 0428 02/10/14 1730 02/11/14 0400  WBC  --   --  13.6*  --  12.2*  --  12.1*  HGB  --   < > 10.3* 13.3 10.3*  --  9.8*  PLT  --   --  255  --  245  --  232  LABCREA 52.64  --   --   --   --   --   --   CREATININE  --   --  6.13*  --  4.58* 3.80* 3.09*  < > = values in this interval not displayed. Estimated Creatinine Clearance: 50 mL/min (by C-G formula based on Cr of 3.09).  Recent Labs  02/09/14 2100  VANCORANDOM 11.4     Microbiology: Recent Results (from the past 720 hour(s))  Blood culture (routine x 2)     Status: None (Preliminary result)   Collection Time: 02/07/14  6:14 PM  Result Value Ref Range Status   Specimen Description BLOOD RIGHT HAND  Final   Special Requests BOTTLES DRAWN AEROBIC AND ANAEROBIC 3 CC EACH  Final   Culture   Final           BLOOD CULTURE RECEIVED NO GROWTH TO DATE CULTURE WILL BE HELD FOR 5 DAYS BEFORE ISSUING A FINAL NEGATIVE REPORT Performed at [facility]    Report Status PENDING  Incomplete  Blood culture (routine x 2)     Status: None (Preliminary result)   Collection Time: 02/07/14  6:15 PM  Result Value Ref Range Status   Specimen Description BLOOD RIGHT ANTECUBITAL  Final   Special Requests BOTTLES DRAWN AEROBIC ONLY 3 CC   Final   Culture   Final           BLOOD CULTURE RECEIVED NO GROWTH TO DATE CULTURE WILL BE HELD FOR 5 DAYS BEFORE ISSUING A FINAL NEGATIVE REPORT Note: Culture results may be compromised due to an inadequate volume of blood received in culture bottles. Performed at [facility]    Report Status PENDING  Incomplete  Wound culture     Status: None   Collection Time: 02/07/14  6:52 PM  Result Value Ref Range Status   Specimen Description STUMP  Final   Special Requests NONE  Final   Gram Stain   Final    FEW WBC PRESENT, PREDOMINANTLY PMN NO SQUAMOUS EPITHELIAL CELLS SEEN NO ORGANISMS SEEN Performed at [facility]    Culture   Final    MULTIPLE ORGANISMS PRESENT, NONE PREDOMINANT Note: NO STAPHYLOCOCCUS AUREUS ISOLATED NO GROUP A STREP (S.PYOGENES) ISOLATED Performed at [facility]    Report Status 02/10/2014 FINAL  Final  Urine culture     Status: None   Collection Time: 02/07/14  8:44 PM  Result Value Ref  Range Status   Specimen Description URINE, RANDOM  Final   Special Requests NONE  Final   Colony Count   Final    >=100,000 COLONIES/ML Performed at [facility]    Culture   Final    ESCHERICHIA COLI Performed at [facility]    Report Status 02/10/2014 FINAL  Final   Organism ID, Bacteria ESCHERICHIA COLI  Final      Susceptibility   Escherichia coli - MIC*    AMPICILLIN 4 SENSITIVE Sensitive     CEFAZOLIN <=4 SENSITIVE Sensitive     CEFTRIAXONE <=1 SENSITIVE Sensitive     CIPROFLOXACIN <=0.25 SENSITIVE Sensitive     GENTAMICIN <=1 SENSITIVE Sensitive     LEVOFLOXACIN <=0.12 SENSITIVE Sensitive     NITROFURANTOIN <=16 SENSITIVE Sensitive     TOBRAMYCIN <=1 SENSITIVE Sensitive     TRIMETH/SULFA <=20 SENSITIVE Sensitive     PIP/TAZO <=4 SENSITIVE Sensitive     * ESCHERICHIA COLI  MRSA PCR Screening     Status: None   Collection Time: 02/08/14  2:43 AM  Result Value Ref  Range Status   MRSA by PCR NEGATIVE NEGATIVE Final    Comment:        The GeneXpert MRSA Assay (FDA approved for NASAL specimens only), is one component of a comprehensive MRSA colonization surveillance program. It is not intended to diagnose MRSA infection nor to guide or monitor treatment for MRSA infections. Performed at [facility]   Gram stain     Status: None   Collection Time: 02/09/14 12:44 PM  Result Value Ref Range Status   Specimen Description ABSCESS KNEE RIGHT  Final   Special Requests PT ON ZOSYN  Final   Gram Stain RARE WBC SEEN NO ORGANISMS SEEN   Final   Report Status 02/09/2014 FINAL  Final  Culture, routine-abscess     Status: None (Preliminary result)   Collection Time: 02/09/14 12:44 PM  Result Value Ref Range Status   Specimen Description ABSCESS KNEE RIGHT  Final   Special Requests PT ON ZOSYN  Final   Gram Stain   Final    RARE WBC NO ORGANISMS SEEN Performed at [facility] Performed at [facility]    Culture   Final    NO GROWTH 2 DAYS Performed at [facility]    Report Status PENDING  Incomplete  Anaerobic culture     Status: None (Preliminary result)   Collection Time: 02/09/14 12:44 PM  Result Value Ref Range Status   Specimen Description ABSCESS KNEE RIGHT  Final   Special Requests PT ON ZOSYN  Final   Gram Stain   Final    NO WBC SEEN NO SQUAMOUS EPITHELIAL CELLS SEEN NO ORGANISMS SEEN Performed at [facility]    Culture   Final    NO ANAEROBES ISOLATED; CULTURE IN PROGRESS FOR 5 DAYS Performed at [facility]    Report Status PENDING  Incomplete  Tissue culture     Status: None (Preliminary result)   Collection Time: 02/09/14 12:46 PM  Result Value Ref Range Status   Specimen Description TISSUE KNEE RIGHT  Final   Special Requests BELOW KNEE AMPUTATION RIGHT   Final   Gram Stain   Final    RARE WBC PRESENT, PREDOMINANTLY PMN NO ORGANISMS SEEN Performed at [facility]    Culture   Final    NO GROWTH 2 DAYS Performed at [facility]    Report  Status PENDING  Incomplete  Anaerobic culture     Status: None (Preliminary result)   Collection Time: 02/09/14 12:46 PM  Result Value Ref Range Status   Specimen Description TISSUE KNEE RIGHT  Final   Special Requests BELOW KNEE AMPUTATION RIGHT  Final   Gram Stain   Final    RARE WBC PRESENT, PREDOMINANTLY PMN NO ORGANISMS SEEN Performed at [facility]    Culture   Final    NO ANAEROBES ISOLATED; CULTURE IN PROGRESS FOR 5 DAYS Performed at [facility]    Report Status PENDING  Incomplete    Anti-infectives    Start     Dose/Rate Route Frequency Ordered Stop   02/10/14 1200  piperacillin-tazobactam (ZOSYN) IVPB 3.375 g  Status:  Discontinued     3.375 g12.5 mL/hr over 240 Minutes Intravenous Every 8 hours 02/10/14 0830 02/11/14 1040   02/09/14 2300  vancomycin (VANCOCIN) 2,000 mg in sodium chloride 0.9 % 500 mL IVPB  Status:  Discontinued     2,000 mg250 mL/hr over 120 Minutes Intravenous Every 48 hours 02/07/14 2254 02/11/14 1040   02/08/14 2000  piperacillin-tazobactam (ZOSYN) IVPB 2.25 g  Status:  Discontinued     2.25 g100 mL/hr over 30 Minutes Intravenous Every 8 hours 02/08/14 1420 02/10/14 0830   02/08/14 0400  piperacillin-tazobactam (ZOSYN) IVPB 3.375 g  Status:  Discontinued     3.375 g12.5 mL/hr over 240 Minutes Intravenous Every 8 hours 02/07/14 2255 02/08/14 1419   02/07/14 2245  vancomycin (VANCOCIN) IVPB 1000 mg/200 mL premix     1,000 mg200 mL/hr over 60 Minutes Intravenous  Once 02/07/14 2243 02/07/14 2358   02/07/14 2100  vancomycin (VANCOCIN) IVPB 1000 mg/200 mL premix     1,000 mg200 mL/hr over 60 Minutes Intravenous  Once 02/07/14 2053 02/07/14 2204   02/07/14 2100  piperacillin-tazobactam (ZOSYN) IVPB 2.25 g     2.25 g100 mL/hr over 30 Minutes Intravenous  Once 02/07/14 2054 02/07/14 2255      Assessment: 44 y.o. M on day #4 of Abx. Abx  narrowed to rocephin for abscess in R BKA stump/Ecoli UTI.S/p I&D 1/5. Tmax 101.9. currently afebrile, wbc elevated 13.6>12.1, PCT 0.26.   1/3 vanc>>1/7        1/6 VT 11 past first dose 1/3 zosyn>>1/7 1/7 rocephin>>  1/3 BCx2 >>ngtd 1/3 UC >>E coli (pan sens) 1/3 Wound >> mult bact, none predominant 1/5 abscess cx>>ngtd  Goal of Therapy:  resolution of infection  Plan:  -Rocephin 1gm q24hr  -Pharmacy will sign off, please re-consult if needed  Knute Neu 02/11/2014,11:02 AM  I agree with above assessment and plan.  Sherlon Handing, PharmD, BCPS Clinical pharmacist, pager [phone] 02/11/2014 11:10 AM

## 2014-02-11 NOTE — Evaluation (Signed)
Physical Therapy Evaluation Patient Details Name: Dean Bowers MRN: [id number] DOB: 22-Jan-1971 Today's Date: 02/11/2014   History of Present Illness  44yo morbidly obese male active smoker with hx HTN, polysubstance abuse, R BKA presented 1/3 with decreased UOP, fluid retention and drainage from R BKA stump. Admitted by [facility] with acute renal failure , cellulitis of R stump and sepsis. Pt  had progressive AMS and ABG revealed respiratory acidosis VDRF 1/4-1/6. Pt s/p I&D right BKA  Clinical Impression  Pt very pleasant and moving well but limited by cardiopulmonary status. Pt with decreased sats with activity secondary to holding his breath but with bed mobility able to maintain sats >96% on 4L and 100% on 2L at rest. Pt will benefit from acute therapy to maximize mobility and function prior to return home as pt with inaccessible house from [facility] level and pt reliant on prosthesis for mobility. Pt currently unable to don prosthesis secondary to I&D. Recommend at least EOB for all meals with continued progression.     Follow Up Recommendations CIR    Equipment Recommendations  None recommended by PT    Recommendations for Other Services       Precautions / Restrictions Precautions Precautions: Fall Precaution Comments: watch vitals, R BKA      Mobility  Bed Mobility Overal bed mobility: Needs Assistance Bed Mobility: Supine to Sit     Supine to sit: Min assist     General bed mobility comments: hand held assist to fully elevate trunk from surface with cues and pause once sitting to recover prior to scooting to EOB  Transfers Overall transfer level: Needs assistance Equipment used: Rolling walker (2 wheeled) Transfers: Sit to/from [facility] Sit to Stand: Min assist;+2 safety/equipment Stand pivot transfers: Min assist;+2 safety/equipment       General transfer comment: Cues for hand placement, sequence and safety. Pt holding his breath with transfer  with max cues to breath and for posture  Ambulation/Gait                Stairs            Wheelchair Mobility    Modified Rankin (Stroke Patients Only)       Balance Overall balance assessment: Needs assistance   Sitting balance-Leahy Scale: Good       Standing balance-Leahy Scale: Fair                               Pertinent Vitals/Pain Pain Assessment: 0-10 Pain Score: 3  Pain Location: right residual limb Pain Descriptors / Indicators: Aching Pain Intervention(s): Repositioned  BP supine 99/49 (59) Sitting 106/63 (71) Sitting in chair 124/110 (113) HR 97-102 sats 100% on 2L    Home Living Family/patient expects to be discharged to:: Private residence Living Arrangements: Spouse/significant other Available Help at Discharge: Family;Available 24 hours/day Type of Home: House Home Access: Stairs to enter Entrance Stairs-Rails: None Entrance Stairs-Number of Steps: 1 Home Layout: Multi-level Home Equipment: Walker - 2 wheels;Bedside commode;Tub bench;Wheelchair - power;Wheelchair - manual      Prior Function Level of Independence: Independent         Comments: pt was independent with use of prosthesis and tub bench prior to admission     Hand Dominance        Extremity/Trunk Assessment   Upper Extremity Assessment: Overall WFL for tasks assessed           Lower  Extremity Assessment: Overall WFL for tasks assessed (ROM limited due to body habitus, R BKA)      Cervical / Trunk Assessment: Normal  Communication   Communication: No difficulties  Cognition Arousal/Alertness: Awake/alert Behavior During Therapy: WFL for tasks assessed/performed Overall Cognitive Status: Within Functional Limits for tasks assessed                      General Comments      Exercises        Assessment/Plan    PT Assessment Patient needs continued PT services  PT Diagnosis Difficulty walking   PT Problem List  Decreased activity tolerance;Cardiopulmonary status limiting activity;Obesity  PT Treatment Interventions Gait training;DME instruction;Functional mobility training;Therapeutic activities;Patient/family education   PT Goals (Current goals can be found in the Care Plan section) Acute Rehab PT Goals Patient Stated Goal: return to fishing PT Goal Formulation: With patient/family Time For Goal Achievement: 02/25/14 Potential to Achieve Goals: Good    Frequency Min 3X/week   Barriers to discharge Inaccessible home environment      Co-evaluation               End of Session Equipment Utilized During Treatment: Gait belt Activity Tolerance: Patient tolerated treatment well Patient left: in chair;with call bell/phone within reach;with family/visitor present Nurse Communication: Mobility status;Precautions         Time: 0102-7253 PT Time Calculation (min) (ACUTE ONLY): 29 min   Charges:   PT Evaluation $Initial PT Evaluation Tier I: 1 Procedure PT Treatments $Therapeutic Activity: 8-22 mins   PT G CodesMelford Aase 02/11/2014, 9:04 AM Elwyn Reach, [place]

## 2014-02-11 NOTE — Progress Notes (Signed)
Patient ID: Dean Bowers, male   DOB: 1971-01-07, 44 y.o.   MRN: [id number]  [facility] Progress Note    Assessment/ Plan:   1. Acute renal failure: Suspected to be hemodynamically mediated with sepsis/hypotension in the setting of ACE inhibitor-possibly evolving into ATN. With polyuric urine output and continued renal recovery. Remains volume overloaded but responsive to furosemide-may use as needed 2. Cellulitis/drainage from right BKA stump: Status post incision and drainage yesterday. Remains on antibiotic therapy with vancomycin and Zosyn. 3. Anemia: Likely anemia of chronic disease, continue to monitor particularly for postoperative drops/PRBC triggers. 4. Anasarca:  Continue intermittent diuretic therapy  Subjective:   Reports to be feeling better and excited that he has lost about 23 pounds    Objective:   BP 99/49 mmHg  Pulse 89  Temp(Src) 98.7 F (37.1 C) (Core (Comment))  Resp 17  Ht 5\' 10"  (1.778 m)  Wt 180.078 kg (397 lb)  BMI 56.96 kg/m2  SpO2 97%  Intake/Output Summary (Last 24 hours) at 02/11/14 0834 Last data filed at 02/11/14 0800  Gross per 24 hour  Intake 813.54 ml  Output   4625 ml  Net -3811.46 ml   Weight change: -5.443 kg (-12 lb)  Physical Exam: Gen: Comfortably resting up in recliner CVS: Pulse regular in rate and rhythm Resp: Clear to auscultation-no rales Abd: Soft, obese, nontender Ext: Status post right BKA, left lower extremity with 2-3+ edema  Imaging: Dg Chest Port 1 View  02/10/2014   CLINICAL DATA:  Acute respiratory failure  EXAM: PORTABLE CHEST - 1 VIEW  COMPARISON:  02/09/2014  FINDINGS: Endotracheal tube is again noted 4 cm above the carina. A nasogastric catheter courses into the stomach. A left jugular central line is again noted in the left innominate vein. Cardiac shadow remains enlarged. Vascular congestion parenchymal edema is again identified bilaterally. Given some technical variations in the film the overall  appearance is stable. No new focal infiltrates are seen.  IMPRESSION: Stable pulmonary edema.  No new focal abnormality is noted.   Electronically Signed   By: Inez Catalina M.D.   On: 02/10/2014 07:28   Dg Chest Port 1 View  02/09/2014   CLINICAL DATA:  Ventilator dependent respiratory failure. Follow-up pulmonary edema.  EXAM: PORTABLE CHEST - 1 VIEW  COMPARISON:  Portable chest x-rays yesterday. Two-view chest x-ray 02/07/2014, 01/28/2014, 07/14/2012.  FINDINGS: Endotracheal tube tip in satisfactory position projecting approximately 6 cm above the carina. Left jugular central venous catheter tip projects over the left innominate vein. Nasogastric tube courses below the diaphragm into the stomach though its tip is not visible. Cardiac silhouette moderately enlarged but stable. Interstitial pulmonary edema improved since the examination is yesterday, though pulmonary venous hypertension and minimal edema persists. Interval dense consolidation in the left lower lobe. No new pulmonary parenchymal abnormalities elsewhere.  IMPRESSION: Support apparatus satisfactory. Improved pulmonary edema, though minimal edema persists. New dense left lower lobe atelectasis.   Electronically Signed   By: Evangeline Dakin M.D.   On: 02/09/2014 11:07   Dg Abd Portable 1v  02/09/2014   CLINICAL DATA:  Nasogastric tube placement.  EXAM: PORTABLE ABDOMEN - 1 VIEW  COMPARISON:  02/08/2013.  FINDINGS: Exam is technically degraded by underpenetration (portable technique and obese body habitus). There is an enteric tube that follows the contour the stomach and the tip is in the duodenum at the junction of the second and third parts of the duodenum. Spinal fixation hardware noted.  IMPRESSION: Enteric tube tip  at the junction of the second and third parts of the duodenum, advanced compared to prior.   Electronically Signed   By: Dereck Ligas M.D.   On: 02/09/2014 10:15    Labs: BMET  Recent Labs Lab 02/07/14 1813 02/07/14 1826  02/08/14 0620 02/08/14 1730 02/09/14 0445 02/09/14 1241 02/10/14 0428 02/10/14 1730 02/11/14 0400  NA 133* 135 135 136 137 135 140 142 140  K 4.2 4.2 5.0 4.0 4.2 4.2 4.3 4.3 3.4*  CL 92* 95* 93* 96 93*  --  94* 94* 91*  CO2 28  --  30 30 29   --  34* 37* 39*  GLUCOSE 85 89 137* 107* 125*  --  154* 93 103*  BUN 51* 51* 53* 59* 58*  --  58* 62* 54*  CREATININE 5.16* 4.70* 6.17* 6.25* 6.13*  --  4.58* 3.80* 3.09*  CALCIUM 8.0*  --  7.6* 7.8* 7.7*  --  8.2* 8.5 8.3*  PHOS  --   --   --   --  8.7*  --   --  6.4* 6.2*   CBC  Recent Labs Lab 02/07/14 1930 02/08/14 0620 02/09/14 0445 02/09/14 1241 02/10/14 0428 02/11/14 0400  WBC 14.2* 13.9* 13.6*  --  12.2* 12.1*  NEUTROABS 11.1*  --   --   --  10.5* 7.9*  HGB 10.8* 9.9* 10.3* 13.3 10.3* 9.8*  HCT 37.7* 36.8* 35.3* 39.0 34.2* 34.0*  MCV 75.4* 78.1 71.9*  --  72.3* 75.2*  PLT 266 282 255  --  245 232    Medications:    . antiseptic oral rinse  7 mL Mouth Rinse QID  . aspirin  325 mg Oral Daily  . budesonide  0.25 mg Nebulization 4 times per day  . chlorhexidine  15 mL Mouth Rinse BID  . heparin subcutaneous  5,000 Units Subcutaneous 3 times per day  . insulin aspart  0-20 Units Subcutaneous 6 times per day  . ipratropium-albuterol  3 mL Nebulization Q6H  . magnesium sulfate 1 - 4 g bolus IVPB  2 g Intravenous Once  . multivitamin with minerals  1 tablet Oral Daily  . pantoprazole sodium  40 mg Per Tube Daily  . piperacillin-tazobactam (ZOSYN)  IV  3.375 g Intravenous Q8H  . potassium chloride  40 mEq Oral Q4H  . sodium chloride  3 mL Intravenous Q12H  . vancomycin  2,000 mg Intravenous Q48H   Elmarie Shiley, MD 02/11/2014, 8:34 AM

## 2014-02-11 NOTE — Progress Notes (Signed)
Inpatient Diabetes Program Recommendations  AACE/ADA: New Consensus Statement on Inpatient Glycemic Control (2013)  Target Ranges:  Prepandial:   less than 140 mg/dL      Peak postprandial:   less than 180 mg/dL (1-2 hours)      Critically ill patients:  140 - 180 mg/dL     Results for BANKS, CHAIKIN (MRN [id number]) as of 02/11/2014 15:39  Ref. Range 02/11/2014 00:12 02/11/2014 04:20 02/11/2014 07:13 02/11/2014 11:59  Glucose-Capillary Latest Range: 70-99 mg/dL 121 (H) 96 163 (H) 129 (H)    Results for TRAYVEON, BECKFORD (MRN [id number]) as of 02/11/2014 15:39  Ref. Range 02/08/2014 06:20  Hgb A1c MFr Bld Latest Range: <5.7 % 6.5 (H)     HPI:  44 year old male with a history of hypertension, hyperlipidemia, morbid obesity, right BKA, polysubstance abuse presents with decreased urine output for the past 1-1/2 weeks which has worsened over the past 5 days. The patient thought he was retaining fluid and went to urgent care on 01/28/2014. The patient was placed on furosemide 20 mg twice a day. He was not getting much better. He went to see his urologist, Dr. Junious Silk, on Monday, 02/01/2014. At that time, his furosemide was increased to 40 mg po twice a day. The patient continued to take his antihypertensive medications including lisinopril/HCTZ. He stated that his urine output was not improving much.  In addition, the patient used cocaine approximately 2 days ago as well as marijuana. He has had a previous history of using cocaine in the past. Apparently, the patient's wife had difficulty waking him up from bed today. The patient fell of his bed and hit his right stump 2 days ago. Since that time, he has noticed a pinhole-sized area on the dorsal lateral aspect of his right stump draining purulent fluid.   No History of DM mentioned in H&P.  Per MD's note from today, new diagnosis of DM.   **Spoke with pt about new diagnosis.  Discussed A1C results with him and explained what an A1C is, basic  pathophysiology of DM Type 2, basic home care, basic diabetes diet nutrition principles, importance of checking CBGs and maintaining good CBG control to prevent long-term and short-term complications.  Also reviewed blood sugar goals at home.  Encouraged patient to check his CBGs at least 1-2 times daily to start.  Also encouraged patient to follow- up with his PCP (Dr. Redmond School) soon after d/c to discuss his new diagnosis of DM.  **RNs to provide ongoing basic DM education at bedside with this patient.  Have ordered educational booklet and DM videos.  Have also placed RD consult for DM diet education for this patient.    MD- Please provide patient with a Rx for CBG meter at time of d/c.  Use order # [id number].  Thanks! Will Follow Wyn Quaker RN, MSN, CDE Diabetes Coordinator Inpatient Diabetes Program Team Pager: [phone] (8a-10p)

## 2014-02-11 NOTE — Progress Notes (Signed)
NUTRITION FOLLOW UP / CONSULT  Intervention:    Diabetes diet education provided.  Diet advancement per MD, recommend CHO modified diet.  Nutrition Dx:   Inadequate oral intake related to inability to eat as evidenced by clear liquid diet, ongoing.  New Goal:   Intake to meet >90% of estimated nutrition needs. Progressing.  Monitor:   Diet advancement, PO intake, labs, weight trend.  Assessment:   44 year old male with a history of hypertension, hyperlipidemia, morbid obesity, right BKA, polysubstance abuse presents with decreased urine output for 1-1/2 weeks. Also with abscess to right BKA stump.  Patient was extubated on 1/6. TF off. Diet has been advanced to clear liquids. Discussed patient in ICU rounds today. Patient is newly diagnosed with diabetes, needs diet education.  Lab Results  Component Value Date   HGBA1C 6.5* 02/08/2014   RD consulted for nutrition education regarding diabetes. Patient sleeping in chair, unable to participate in diet education. Spoke with patient's wife regarding diet recommendations for diabetes.  RD provided "Carbohydrate Counting for People with Diabetes" handout from the Academy of [employer]. Discussed different food groups and their effects on blood sugar, emphasizing carbohydrate-containing foods. Provided list of carbohydrates and recommended serving sizes of common foods.  Discussed importance of controlled and consistent carbohydrate intake throughout the day. Provided examples of ways to balance meals/snacks and encouraged intake of high-fiber, whole grain complex carbohydrates. Teach back method used.  Expect fair compliance. Needs OP education, will order.   Height: Ht Readings from Last 1 Encounters:  02/08/14 5\' 10"  (1.778 m)    Weight Status:   Wt Readings from Last 1 Encounters:  02/11/14 397 lb (180.078 kg)   02/08/14 423 lb 4.5 oz (192 kg)    Re-estimated needs:  Kcal: 2500 Protein: 125 gm Fluid: 2.5  L  Skin: no issues  Diet Order: Diet clear liquid   Intake/Output Summary (Last 24 hours) at 02/11/14 1219 Last data filed at 02/11/14 1200  Gross per 24 hour  Intake 691.62 ml  Output   5075 ml  Net -4383.38 ml    Last BM: 1/6   Labs:   Recent Labs Lab 02/09/14 0445  02/10/14 0428 02/10/14 1730 02/11/14 0400  NA 137  < > 140 142 140  K 4.2  < > 4.3 4.3 3.4*  CL 93*  --  94* 94* 91*  CO2 29  --  34* 37* 39*  BUN 58*  --  58* 62* 54*  CREATININE 6.13*  --  4.58* 3.80* 3.09*  CALCIUM 7.7*  --  8.2* 8.5 8.3*  MG  --   --   --  2.0 1.9  PHOS 8.7*  --   --  6.4* 6.2*  GLUCOSE 125*  --  154* 93 103*  < > = values in this interval not displayed.  CBG (last 3)   Recent Labs  02/10/14 1944 02/11/14 0012 02/11/14 0420  GLUCAP 85 121* 96    Scheduled Meds: . antiseptic oral rinse  7 mL Mouth Rinse QID  . aspirin EC  81 mg Oral Daily  . budesonide  0.25 mg Nebulization 4 times per day  . cefTRIAXone (ROCEPHIN)  IV  1 g Intravenous Q24H  . chlorhexidine  15 mL Mouth Rinse BID  . heparin subcutaneous  5,000 Units Subcutaneous 3 times per day  . insulin aspart  0-9 Units Subcutaneous TID WC  . ipratropium-albuterol  3 mL Nebulization Q6H  . living well with diabetes book   Does  not apply Once  . multivitamin with minerals  1 tablet Oral Daily  . sodium chloride  3 mL Intravenous Q12H    Continuous Infusions: . sodium chloride      Molli Barrows, RD, LDN, [place] Pager [phone] After Hours Pager [phone]

## 2014-02-11 NOTE — Progress Notes (Signed)
Pt admitted to the unit from 61M.  Pt is alert and oriented. Pt oriented to room, staff, and call bell. Educated on fall safety plan. Bed in lowest position. Full assessment to Epic. Call bell with in reach. Educated to call for assists. Will continue to monitor. Mady Gemma, RN
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
## 2014-02-11 NOTE — Progress Notes (Signed)
Rehab Admissions Coordinator Note:  Patient was screened by Dean Bowers for appropriateness for an Inpatient Acute Rehab Consult.  At this time, we are recommending Inpatient Rehab consult.  Dean Bowers 02/11/2014, 9:31 AM  I can be reached at [phone].

## 2014-02-12 DIAGNOSIS — N390 Urinary tract infection, site not specified: Secondary | ICD-10-CM

## 2014-02-12 LAB — TISSUE CULTURE: Culture: NO GROWTH

## 2014-02-12 LAB — CBC
HEMATOCRIT: 36.3 % — AB (ref 39.0–52.0)
HEMOGLOBIN: 10.2 g/dL — AB (ref 13.0–17.0)
MCH: 21.3 pg — AB (ref 26.0–34.0)
MCHC: 28.1 g/dL — AB (ref 30.0–36.0)
MCV: 75.9 fL — ABNORMAL LOW (ref 78.0–100.0)
Platelets: 295 10*3/uL (ref 150–400)
RBC: 4.78 MIL/uL (ref 4.22–5.81)
RDW: 18.3 % — ABNORMAL HIGH (ref 11.5–15.5)
WBC: 9 10*3/uL (ref 4.0–10.5)

## 2014-02-12 LAB — BLOOD GAS, ARTERIAL
Acid-Base Excess: 13 mmol/L — ABNORMAL HIGH (ref 0.0–2.0)
Bicarbonate: 38.8 mEq/L — ABNORMAL HIGH (ref 20.0–24.0)
Drawn by: 252031
O2 Content: 2 L/min
O2 Saturation: 95.6 %
PATIENT TEMPERATURE: 98.6
PCO2 ART: 68.8 mmHg — AB (ref 35.0–45.0)
PH ART: 7.37 (ref 7.350–7.450)
PO2 ART: 88.8 mmHg (ref 80.0–100.0)
TCO2: 40.9 mmol/L (ref 0–100)

## 2014-02-12 LAB — GLUCOSE, CAPILLARY
GLUCOSE-CAPILLARY: 121 mg/dL — AB (ref 70–99)
Glucose-Capillary: 108 mg/dL — ABNORMAL HIGH (ref 70–99)
Glucose-Capillary: 108 mg/dL — ABNORMAL HIGH (ref 70–99)
Glucose-Capillary: 119 mg/dL — ABNORMAL HIGH (ref 70–99)
Glucose-Capillary: 127 mg/dL — ABNORMAL HIGH (ref 70–99)
Glucose-Capillary: 165 mg/dL — ABNORMAL HIGH (ref 70–99)

## 2014-02-12 LAB — BASIC METABOLIC PANEL
Anion gap: 11 (ref 5–15)
BUN: 40 mg/dL — ABNORMAL HIGH (ref 6–23)
CALCIUM: 8.7 mg/dL (ref 8.4–10.5)
CHLORIDE: 93 meq/L — AB (ref 96–112)
CO2: 35 mmol/L — ABNORMAL HIGH (ref 19–32)
CREATININE: 2.01 mg/dL — AB (ref 0.50–1.35)
GFR calc non Af Amer: 39 mL/min — ABNORMAL LOW (ref >90)
GFR, EST AFRICAN AMERICAN: 45 mL/min — AB (ref >90)
Glucose, Bld: 103 mg/dL — ABNORMAL HIGH (ref 70–99)
Potassium: 4.1 mmol/L (ref 3.5–5.1)
SODIUM: 139 mmol/L (ref 135–145)

## 2014-02-12 LAB — VITAMIN D 1,25 DIHYDROXY
Vitamin D 1, 25 (OH)2 Total: 13 pg/mL — ABNORMAL LOW (ref 18–72)
Vitamin D2 1, 25 (OH)2: <8 pg/mL
Vitamin D3 1, 25 (OH)2: 13 pg/mL

## 2014-02-12 LAB — MAGNESIUM: MAGNESIUM: 1.9 mg/dL (ref 1.5–2.5)

## 2014-02-12 LAB — PHOSPHORUS: Phosphorus: 3.5 mg/dL (ref 2.3–4.6)

## 2014-02-12 MED ORDER — GABAPENTIN 600 MG PO TABS
600.0000 mg | ORAL_TABLET | Freq: Two times a day (BID) | ORAL | Status: DC
  Administered 2014-02-12 – 2014-02-15 (×6): 600 mg via ORAL
  Filled 2014-02-12 (×7): qty 1

## 2014-02-12 NOTE — Evaluation (Signed)
Occupational Therapy Evaluation Patient Details Name: Dean Bowers MRN: [id number] DOB: 05-Oct-1970 Today's Date: 02/12/2014    History of Present Illness 44yo morbidly obese male active smoker with hx HTN, polysubstance abuse, R BKA presented 1/3 with decreased UOP, fluid retention and drainage from R BKA stump. Admitted by [facility] at [facility] with acute renal failure , cellulitis of R stump and sepsis. Pt  had progressive AMS and ABG revealed respiratory acidosis VDRF 1/4-1/6. Pt s/p I&D right BKA   Clinical Impression   Pt was performing ADL and mobility at a modified independent level prior to admission.  He will not be able to use his R LE prosthesis until it has healed and currently requires +2 assist for transfers and any standing ADL. Pt is a high fall risk with a home of multiple levels. His tolerance for activity is decreased.  Will need short term, intense rehab to prepare him to safely return home. Will follow acutely.    Follow Up Recommendations  CIR;Supervision/Assistance - 24 hour    Equipment Recommendations       Recommendations for Other Services       Precautions / Restrictions Precautions Precautions: Fall Precaution Comments: watch vitals, R BKA Restrictions Weight Bearing Restrictions: Yes Other Position/Activity Restrictions: Cannot use prosthesis until ortho clears      Mobility Bed Mobility Overal bed mobility: Needs Assistance Bed Mobility: Supine to Sit;Sit to Supine     Supine to sit: Min guard Sit to supine: Min assist   General bed mobility comments: verbal cues and heavy use of rail, HOB elevated, cues for breathing  Transfers Overall transfer level: Needs assistance Equipment used: Rolling walker (2 wheeled) Transfers: Sit to/from Stand Sit to Stand: Min assist;+2 safety/equipment Stand pivot transfers: Min assist;+2 safety/equipment       General transfer comment: used momentum, heavy use of UEs, required 2 trials to stand using walker to  pull up    Balance Overall balance assessment: Needs assistance Sitting-balance support: Feet supported;No upper extremity supported Sitting balance-Leahy Scale: Good     Standing balance support: Bilateral upper extremity supported;During functional activity Standing balance-Leahy Scale: Poor Standing balance comment: pt unable to release walker for UE use for ADL                            ADL Overall ADL's : Needs assistance/impaired Eating/Feeding: Independent;Sitting   Grooming: Set up;Wash/dry hands;Wash/dry face;Oral care;Sitting   Upper Body Bathing: Set up;Sitting   Lower Body Bathing: Sit to/from stand;Maximal assistance;+2 for physical assistance   Upper Body Dressing : Set up;Sitting   Lower Body Dressing: Maximal assistance;Sit to/from stand;+2 for physical assistance   Toilet Transfer: +2 for physical assistance;Minimal assistance;Stand-pivot;BSC;RW;Requires wide/bariatric   Toileting- Clothing Manipulation and Hygiene: +2 for physical assistance;Total assistance;Sit to/from stand       Functional mobility during ADLs: +2 for physical assistance;Minimal assistance;Rolling walker       Vision                     Perception     Praxis      Pertinent Vitals/Pain Pain Assessment: Faces Faces Pain Scale: Hurts little more Pain Location: R LE Pain Descriptors / Indicators: Aching Pain Intervention(s): Monitored during session;Premedicated before session;Repositioned     Hand Dominance Right   Extremity/Trunk Assessment Upper Extremity Assessment Upper Extremity Assessment: Overall WFL for tasks assessed   Lower Extremity Assessment Lower Extremity Assessment: Defer to PT evaluation  Cervical / Trunk Assessment Cervical / Trunk Assessment: Normal   Communication Communication Communication: No difficulties   Cognition Arousal/Alertness: Awake/alert Behavior During Therapy: WFL for tasks assessed/performed Overall  Cognitive Status: Within Functional Limits for tasks assessed                     General Comments       Exercises       Shoulder Instructions      Home Living Family/patient expects to be discharged to:: Private residence Living Arrangements: Spouse/significant other Available Help at Discharge: Family;Available 24 hours/day Type of Home: House Home Access: Stairs to enter [employer] of Steps: 1 Entrance Stairs-Rails: None Home Layout: Multi-level Alternate Level Stairs-Number of Steps: 1- 5 levels with one step between each level in home   Bathroom Shower/Tub: Tub/shower unit   Bathroom Toilet: Handicapped height     Home Equipment: Environmental consultant - 2 wheels;Bedside commode;Tub bench;Wheelchair - power;Wheelchair - manual          Prior Functioning/Environment Level of Independence: Independent with assistive device(s)        Comments: pt was independent with use of prosthesis and tub bench prior to admission    OT Diagnosis: Generalized weakness;Acute pain   OT Problem List: Decreased strength;Decreased activity tolerance;Impaired balance (sitting and/or standing);Decreased knowledge of use of DME or AE;Decreased safety awareness;Obesity;Pain   OT Treatment/Interventions: Self-care/ADL training;Energy conservation;DME and/or AE instruction;Therapeutic activities;Patient/family education;Balance training    OT Goals(Current goals can be found in the care plan section) Acute Rehab OT Goals Patient Stated Goal: return to fishing OT Goal Formulation: With patient Time For Goal Achievement: 02/26/14 Potential to Achieve Goals: Good ADL Goals Pt Will Perform Lower Body Bathing: with min assist;sit to/from stand;with adaptive equipment Pt Will Perform Lower Body Dressing: with min assist;sit to/from stand;with adaptive equipment Pt Will Transfer to Toilet: with supervision;ambulating;bedside commode Pt Will Perform Toileting - Clothing Manipulation and  hygiene: with supervision;sit to/from stand  OT Frequency: Min 2X/week   Barriers to D/C: Inaccessible home environment          Co-evaluation              End of Session Equipment Utilized During Treatment: Rolling walker  Activity Tolerance: Patient limited by fatigue Patient left: in bed;with call bell/phone within reach;with family/visitor present;with nursing/sitter in room   Time: 1227-1250 OT Time Calculation (min): 23 min Charges:  OT General Charges $OT Visit: 1 Procedure OT Evaluation $Initial OT Evaluation Tier I: 1 Procedure OT Treatments $Self Care/Home Management : 8-22 mins G-Codes:    Malka So 02/12/2014, 3:49 PM  [phone]

## 2014-02-12 NOTE — Progress Notes (Addendum)
I received a call from PT. that pt did not do as well as he had wished mobilizing to d/c directly home. I will contact [employer] insurance to begin an inpt rehab admission authorization possibly for Monday if approved. [phone] I await OT eval also.

## 2014-02-12 NOTE — Progress Notes (Signed)
Physical Therapy Treatment Patient Details Name: Dean Bowers MRN: [id number] DOB: 1970/06/03 Today's Date: 02/12/2014    History of Present Illness 44yo morbidly obese male active smoker with hx HTN, polysubstance abuse, R BKA presented 1/3 with decreased UOP, fluid retention and drainage from R BKA stump. Admitted by [facility] at [facility] with acute renal failure , cellulitis of R stump and sepsis. Pt  had progressive AMS and ABG revealed respiratory acidosis VDRF 1/4-1/6. Pt s/p I&D right BKA    PT Comments    Pt progressing slowly towards physical therapy goals. Was able to ambulate 8 feet with RW, however O2 dropped to 81% with supplemental O2 donned. He reports that once home, he will have to be able to walk 25-30 feet at a time. Pt will have to negotiate 1 step at multiple sites in his home. Attempted stair training, however pt was not able to clear foot from floor high enough to safely land on the step. There is an option for family/friends to build small ramps, however unsure how safe pt will be on an incline as well.   Overall, pt's tolerance for functional activity is decreased, and could benefit from further skilled PT in the CIR setting to increase strength, distance able to be walked at one time, and overall independence with functional mobility. At this time I do not feel that pt is safe to return home, and pt is open to CIR for continued rehab.  If pt is to return home, will need pt/family education regarding safety modifications of the home, transportation options (ambulance transfer if pt cannot negotiate step), energy conservation, and proper 1:12 ratio for ramp incline as one step is reportedly 8-10" high.   Follow Up Recommendations  CIR     Equipment Recommendations  None recommended by PT    Recommendations for Other Services Rehab consult     Precautions / Restrictions Precautions Precautions: Fall Precaution Comments: watch vitals, R BKA Restrictions Weight Bearing  Restrictions: Yes Other Position/Activity Restrictions: Cannot use prosthesis until ortho clears    Mobility  Bed Mobility Overal bed mobility: Needs Assistance Bed Mobility: Supine to Sit     Supine to sit: Min guard     General bed mobility comments: Pt was able to transition to EOB with VC's for sequencing and technique. Noted heavy use of bed rails for support and HOB was elevated. Close guard for safety as pt scooted.   Transfers Overall transfer level: Needs assistance Equipment used: Rolling walker (2 wheeled) Transfers: Sit to/from Stand Sit to Stand: Min assist;+2 safety/equipment         General transfer comment: VC's for hand placement on seated surface for safety. Pt continued to pull up to stand from walker, and used rocking momentum to get weight over his L foot.   Ambulation/Gait Ambulation/Gait assistance: Min assist;+2 safety/equipment Ambulation Distance (Feet): 8 Feet Assistive device: Rolling walker (2 wheeled) Gait Pattern/deviations: Decreased stride length;Trunk flexed;Step-to pattern Gait velocity: Decreased Gait velocity interpretation: Below normal speed for age/gender General Gait Details: Pt was able to ambulate ~8 feet with RW and steadying assist. Close chair follow utilized for safety. Pt on supplemental O2 throughout gait training and sats dropped from 91% to 81%.    Stairs Stairs: Yes       General stair comments: Pt attempted hopping straight up and down before trying to hop up backwards onto the step. Pt was unable to clear foot off ground high enough to safely land on the step present in  room - and pt states steps at home are higher. Multiple attempts to gain height on hop however pt was just not able to complete at this time.   Wheelchair Mobility    Modified Rankin (Stroke Patients Only)       Balance Overall balance assessment: Needs assistance Sitting-balance support: Feet supported;No upper extremity supported Sitting  balance-Leahy Scale: Good     Standing balance support: Bilateral upper extremity supported;During functional activity Standing balance-Leahy Scale: Poor Standing balance comment: Pt requires UE support to maintain standing balance.                     Cognition Arousal/Alertness: Awake/alert Behavior During Therapy: WFL for tasks assessed/performed Overall Cognitive Status: Within Functional Limits for tasks assessed                      Exercises      General Comments        Pertinent Vitals/Pain Pain Assessment: Faces Faces Pain Scale: Hurts little more    Home Living Family/patient expects to be discharged to:: Private residence Living Arrangements: Spouse/significant other                  Prior Function            PT Goals (current goals can now be found in the care plan section) Acute Rehab PT Goals Patient Stated Goal: return to fishing PT Goal Formulation: With patient/family Time For Goal Achievement: 02/25/14 Potential to Achieve Goals: Good Progress towards PT goals: Progressing toward goals    Frequency  Min 3X/week    PT Plan Current plan remains appropriate    Co-evaluation             End of Session Equipment Utilized During Treatment: Gait belt Activity Tolerance: Patient tolerated treatment well Patient left: in chair;with call bell/phone within reach     Time: 1313-1355 PT Time Calculation (min) (ACUTE ONLY): 42 min  Charges:  $Gait Training: 23-37 mins $Therapeutic Activity: 8-22 mins                    G Codes:      Rolinda Roan February 27, 2014, 2:17 PM   Rolinda Roan, PT, DPT Acute Rehabilitation Services Pager: [phone]

## 2014-02-12 NOTE — Progress Notes (Signed)
I met with pt, his wife, and Dad at bedside. We discussed his needs for mobilizing to be ready for d/c home. He is living in a rental until the end of February. His handicapped home will then be available. His current home is a level entry but with one step into bathroom and bedroom . PTA pt was ambulating within home with his prosthesis. Uses [place]over round and drives in the community. He needs to be able to stand pivot and use RW to go to bathroom and bedroom. We discussed the need for small temporary ramps to increase accessibility into the home. He has friends that can construct these. I have asked P.T. To assist with practicing one step entry to areas of his home so that d/c home is feasible. Pt not in need of intense inpt rehab at this time and pt prefers direct admit home. [phone]

## 2014-02-12 NOTE — Progress Notes (Signed)
[facility] Progress Note  Subjective  Dong well No specific complaints  No pain in R stump   Review of Systems  Constitutional: Negative for fever and chills.  Cardiovascular: Negative for chest pain and palpitations.  Genitourinary:       Foley      Objective   BP 108/51 mmHg  Pulse 93  Temp(Src) 97.7 F (36.5 C) (Oral)  Resp 18  Ht 5\' 10"  (1.778 m)  Wt 168.284 kg (371 lb)  BMI 53.23 kg/m2  SpO2 98%  Intake/Output      01/07 0701 - 01/08 0700 01/08 0701 - 01/09 0700   P.O. 300 600   I.V. (mL/kg) 80 (0.5)    NG/GT     IV Piggyback 50    Total Intake(mL/kg) 430 (2.6) 600 (3.6)   Urine (mL/kg/hr) 3900 (1) 600 (0.4)   Total Output 3900 600   Net -3470 0        Stool Occurrence 1 x       Exam  Gen: sitting in bedside chair, NAD. Generalized swelling markedly decreased  Ext:       Right Lower Extremity   Dressing removed  Surgical wound stable  Scant serous drainage  Penrose removed  Swelling markedly decreased in stump    Assessment and Plan   POD/HD#: 91  44 y/o male admitted for Respiratory failure, AKI, abscess R BKA stump  1. R BKA abscess               No growth to date             Stump looks good              Dressing changes as needed  Stump shrinker can be applied by pt              Unlikely source for pts medical problems   2. Medical issues             per IM   3. Dispo             Per primary service             follow up in 10-14 days with ortho                 Jari Pigg, PA-C [facility] [phone] (O) 02/12/2014 3:31 PM

## 2014-02-12 NOTE — Progress Notes (Signed)
PROGRESS NOTE  Dean Bowers SAY:[id number] DOB: Jun 16, 1970 DOA: 02/07/2014 PCP: Wyatt Haste, MD  HPI/Recap of past 10 hours: 44 year old morbidly obese male with past oral history of hypertension, polysubstance abuse, and right BKA presented on 1/3 with drainage from right stump, acute renal failure and decreased urine output and treatment of sepsis. Following admission, patient had worsening encephalopathy and respiratory acidosis and patient was transferred to ICU. Patient was intubated on ventilator from 1/4-1/6.  Workup in ICU noted bilateral lower sternal cellulitis with fluid and air surrounding right BKA stump consistent with abscess. Seen by orthopedic surgery and underwent debridement which grew out minimal bacteria and not felt to be source. Patient's urine grew out greater than 100,000 colonies of Escherichia coli which was treated as source of infection. Patient is able to be transferred to hospitalist service and out of ICU by 1/7. His renal function has improved somewhat, but renal failure felt to be secondary to sepsis/hypotension the setting of ACE inhibitor possibly evolving into acute tubular necrosis. Has been responding to Lasix.  Patient seen today, feeling better. Having some numbness and would like his Neurontin restarted. His noted to have mild oxygen desaturation with exertion with PT. Denies any shortness of breath at rest  Assessment/Plan: Active Problems:   Polysubstance abuse: Stable, counseled    Sepsis secondary to UTI   Transaminasemia: Secondary to shock    Acute respiratory failure with hypercapnia: Secondary to sepsis. Status post ventilator and recovered. Currently volume overloaded.   Cellulitis of leg with Abscess: Status post debridement, not felt to be source of sepsis   Acute renal failure: As above. Felt to be in part from prerenal from sepsis and then on ARB needing to some acute tubular necrosis: Slow improvement on Lasix, creatinine down  to 2   Morbid obesity: Patient meets criteria with BMI greater than 40 Volume overload: Echocardiogram done 1/6 noting no acute evidence of systolic or diastolic dysfunction. On Lasix  Code Status: Full code  Family Communication: Left message with family  Disposition Plan: Inpatient rehabilitation, hopefully Monday. Need to further diurese   Consultants:  Nephrology  Critical care  Physical medicine and rehabilitation  Procedures:  Echocardiogram: Poor study, no evidence of systolic or diastolic dysfunction  Status post debridement of stump wound by orthopedic surgery done 1/5  Placement of IJ line 1/4  Antibiotics:  Vancomycin 1/4-present  Zosyn 1/4-present   Objective: BP 108/51 mmHg  Pulse 93  Temp(Src) 97.7 F (36.5 C) (Oral)  Resp 18  Ht 5\' 10"  (1.778 m)  Wt 168.284 kg (371 lb)  BMI 53.23 kg/m2  SpO2 98%  Intake/Output Summary (Last 24 hours) at 02/12/14 1803 Last data filed at 02/12/14 1747  Gross per 24 hour  Intake    840 ml  Output   1250 ml  Net   -410 ml   Filed Weights   02/10/14 0500 02/11/14 0600 02/11/14 2145  Weight: 185.521 kg (409 lb) 180.078 kg (397 lb) 168.284 kg (371 lb)    Exam:   General:  Alert and oriented 3, no acute distress  Cardiovascular: Regular rate and rhythm, S1-S2  Respiratory: Bilateral end expiratory wheeze  Abdomen: Soft, obese, nontender, positive bowel sounds  Musculoskeletal: Status post right BKA   Data Reviewed: Basic Metabolic Panel:  Recent Labs Lab 02/09/14 0445  02/10/14 0428 02/10/14 1730 02/11/14 0400 02/11/14 1725 02/12/14 0819  NA 137  < > 140 142 140 138 139  K 4.2  < > 4.3 4.3 3.4* 4.2  4.1  CL 93*  --  94* 94* 91* 90* 93*  CO2 29  --  34* 37* 39* 37* 35*  GLUCOSE 125*  --  154* 93 103* 132* 103*  BUN 58*  --  58* 62* 54* 45* 40*  CREATININE 6.13*  --  4.58* 3.80* 3.09* 2.56* 2.01*  CALCIUM 7.7*  --  8.2* 8.5 8.3* 8.5 8.7  MG  --   --   --  2.0 1.9 2.1 1.9  PHOS 8.7*  --    --  6.4* 6.2* 4.4 3.5  < > = values in this interval not displayed. Liver Function Tests:  Recent Labs Lab 02/07/14 1813 02/08/14 0620 02/09/14 0445 02/10/14 0428  AST 525* 243* 111* 63*  ALT 500* 365* 277* 184*  ALKPHOS 70 61 65 56  BILITOT 0.7 0.8 0.8 1.3*  PROT 7.4 6.7 6.5 6.5  ALBUMIN 3.4* 3.0* 2.8* 2.7*   No results for input(s): LIPASE, AMYLASE in the last 168 hours. No results for input(s): AMMONIA in the last 168 hours. CBC:  Recent Labs Lab 02/07/14 1930 02/08/14 0620 02/09/14 0445 02/09/14 1241 02/10/14 0428 02/11/14 0400 02/12/14 0600  WBC 14.2* 13.9* 13.6*  --  12.2* 12.1* 9.0  NEUTROABS 11.1*  --   --   --  10.5* 7.9*  --   HGB 10.8* 9.9* 10.3* 13.3 10.3* 9.8* 10.2*  HCT 37.7* 36.8* 35.3* 39.0 34.2* 34.0* 36.3*  MCV 75.4* 78.1 71.9*  --  72.3* 75.2* 75.9*  PLT 266 282 255  --  245 232 295   Cardiac Enzymes:    Recent Labs Lab 02/08/14 0620 02/08/14 1000 02/08/14 2048 02/09/14 1830 02/09/14 2228 02/10/14 0428  CKTOTAL  --   --  114  --   --   --   TROPONINI 1.20* 1.11*  --  1.42* 1.36* 1.34*   BNP (last 3 results) No results for input(s): PROBNP in the last 8760 hours. CBG:  Recent Labs Lab 02/12/14 0007 02/12/14 0019 02/12/14 0816 02/12/14 1134 02/12/14 1559  GLUCAP 127* 108* 108* 165* 119*    Recent Results (from the past 240 hour(s))  Blood culture (routine x 2)     Status: None (Preliminary result)   Collection Time: 02/07/14  6:14 PM  Result Value Ref Range Status   Specimen Description BLOOD RIGHT HAND  Final   Special Requests BOTTLES DRAWN AEROBIC AND ANAEROBIC 3 CC EACH  Final   Culture   Final           BLOOD CULTURE RECEIVED NO GROWTH TO DATE CULTURE WILL BE HELD FOR 5 DAYS BEFORE ISSUING A FINAL NEGATIVE REPORT Performed at [facility]    Report Status PENDING  Incomplete  Blood culture (routine x 2)     Status: None (Preliminary result)   Collection Time: 02/07/14  6:15 PM  Result Value Ref Range Status    Specimen Description BLOOD RIGHT ANTECUBITAL  Final   Special Requests BOTTLES DRAWN AEROBIC ONLY 3 CC   Final   Culture   Final           BLOOD CULTURE RECEIVED NO GROWTH TO DATE CULTURE WILL BE HELD FOR 5 DAYS BEFORE ISSUING A FINAL NEGATIVE REPORT Note: Culture results may be compromised due to an inadequate volume of blood received in culture bottles. Performed at [facility]    Report Status PENDING  Incomplete  Wound culture     Status: None   Collection Time: 02/07/14  6:52 PM  Result Value Ref Range Status   Specimen Description STUMP  Final   Special Requests NONE  Final   Gram Stain   Final    FEW WBC PRESENT, PREDOMINANTLY PMN NO SQUAMOUS EPITHELIAL CELLS SEEN NO ORGANISMS SEEN Performed at [facility]    Culture   Final    MULTIPLE ORGANISMS PRESENT, NONE PREDOMINANT Note: NO STAPHYLOCOCCUS AUREUS ISOLATED NO GROUP A STREP (S.PYOGENES) ISOLATED Performed at [facility]    Report Status 02/10/2014 FINAL  Final  Urine culture     Status: None   Collection Time: 02/07/14  8:44 PM  Result Value Ref Range Status   Specimen Description URINE, RANDOM  Final   Special Requests NONE  Final   Colony Count   Final    >=100,000 COLONIES/ML Performed at [facility]    Culture   Final    ESCHERICHIA COLI Performed at [facility]    Report Status 02/10/2014 FINAL  Final   Organism ID, Bacteria ESCHERICHIA COLI  Final      Susceptibility   Escherichia coli - MIC*    AMPICILLIN 4 SENSITIVE Sensitive     CEFAZOLIN <=4 SENSITIVE Sensitive     CEFTRIAXONE <=1 SENSITIVE Sensitive     CIPROFLOXACIN <=0.25 SENSITIVE Sensitive     GENTAMICIN <=1 SENSITIVE Sensitive     LEVOFLOXACIN <=0.12 SENSITIVE Sensitive     NITROFURANTOIN <=16 SENSITIVE Sensitive     TOBRAMYCIN <=1 SENSITIVE Sensitive     TRIMETH/SULFA <=20 SENSITIVE Sensitive     PIP/TAZO <=4 SENSITIVE Sensitive     * ESCHERICHIA COLI  MRSA PCR Screening     Status: None    Collection Time: 02/08/14  2:43 AM  Result Value Ref Range Status   MRSA by PCR NEGATIVE NEGATIVE Final    Comment:        The GeneXpert MRSA Assay (FDA approved for NASAL specimens only), is one component of a comprehensive MRSA colonization surveillance program. It is not intended to diagnose MRSA infection nor to guide or monitor treatment for MRSA infections. Performed at [facility]   Gram stain     Status: None   Collection Time: 02/09/14 12:44 PM  Result Value Ref Range Status   Specimen Description ABSCESS KNEE RIGHT  Final   Special Requests PT ON ZOSYN  Final   Gram Stain RARE WBC SEEN NO ORGANISMS SEEN   Final   Report Status 02/09/2014 FINAL  Final  Culture, routine-abscess     Status: None (Preliminary result)   Collection Time: 02/09/14 12:44 PM  Result Value Ref Range Status   Specimen Description ABSCESS KNEE RIGHT  Final   Special Requests PT ON ZOSYN  Final   Gram Stain   Final    RARE WBC NO ORGANISMS SEEN Performed at [facility] Performed at [facility]    Culture   Final    NO GROWTH 2 DAYS Performed at [facility]    Report Status PENDING  Incomplete  Anaerobic culture     Status: None (Preliminary result)   Collection Time: 02/09/14 12:44 PM  Result Value Ref Range Status   Specimen Description ABSCESS KNEE RIGHT  Final   Special Requests PT ON ZOSYN  Final   Gram Stain   Final    NO WBC SEEN NO SQUAMOUS EPITHELIAL CELLS SEEN NO ORGANISMS SEEN Performed at [facility]    Culture   Final    NO ANAEROBES ISOLATED; CULTURE IN  PROGRESS FOR 5 DAYS Performed at [facility]    Report Status PENDING  Incomplete  Tissue culture     Status: None   Collection Time: 02/09/14 12:46 PM  Result Value Ref Range Status   Specimen Description TISSUE KNEE RIGHT  Final   Special Requests BELOW KNEE AMPUTATION RIGHT   Final   Gram Stain   Final    RARE WBC PRESENT, PREDOMINANTLY PMN NO ORGANISMS  SEEN Performed at [facility]    Culture   Final    NO GROWTH 3 DAYS Performed at [facility]    Report Status 02/12/2014 FINAL  Final  Anaerobic culture     Status: None (Preliminary result)   Collection Time: 02/09/14 12:46 PM  Result Value Ref Range Status   Specimen Description TISSUE KNEE RIGHT  Final   Special Requests BELOW KNEE AMPUTATION RIGHT  Final   Gram Stain   Final    RARE WBC PRESENT, PREDOMINANTLY PMN NO ORGANISMS SEEN Performed at [facility]    Culture   Final    NO ANAEROBES ISOLATED; CULTURE IN PROGRESS FOR 5 DAYS Performed at [facility]    Report Status PENDING  Incomplete     Studies: No results found.  Scheduled Meds: . antiseptic oral rinse  7 mL Mouth Rinse QID  . aspirin EC  81 mg Oral Daily  . budesonide  0.25 mg Nebulization 4 times per day  . cefTRIAXone (ROCEPHIN)  IV  1 g Intravenous Q24H  . chlorhexidine  15 mL Mouth Rinse BID  . gabapentin  600 mg Oral BID  . heparin subcutaneous  5,000 Units Subcutaneous 3 times per day  . insulin aspart  0-9 Units Subcutaneous TID WC  . ipratropium-albuterol  3 mL Nebulization Q6H  . multivitamin with minerals  1 tablet Oral Daily  . sodium chloride  3 mL Intravenous Q12H    Continuous Infusions: . sodium chloride 75 mL/hr at 02/12/14 1245     Time spent: 25 minutes  [place] [facility] Pager [phone]. If 7PM-7AM, please contact night-coverage at [email or web address], password [facility] 02/12/2014, 6:03 PM  LOS: 5 days

## 2014-02-13 LAB — BLOOD GAS, ARTERIAL
Acid-Base Excess: 9.7 mmol/L — ABNORMAL HIGH (ref 0.0–2.0)
BICARBONATE: 35.5 meq/L — AB (ref 20.0–24.0)
Drawn by: 422461
O2 CONTENT: 2 L/min
O2 Saturation: 94.1 %
PATIENT TEMPERATURE: 98.9
PCO2 ART: 67.4 mmHg — AB (ref 35.0–45.0)
PH ART: 7.342 — AB (ref 7.350–7.450)
PO2 ART: 81.1 mmHg (ref 80.0–100.0)
TCO2: 37.5 mmol/L (ref 0–100)

## 2014-02-13 LAB — CULTURE, ROUTINE-ABSCESS: Culture: NO GROWTH

## 2014-02-13 LAB — CBC
HEMATOCRIT: 36.6 % — AB (ref 39.0–52.0)
Hemoglobin: 10 g/dL — ABNORMAL LOW (ref 13.0–17.0)
MCH: 20.7 pg — AB (ref 26.0–34.0)
MCHC: 27.3 g/dL — ABNORMAL LOW (ref 30.0–36.0)
MCV: 75.6 fL — AB (ref 78.0–100.0)
PLATELETS: 276 10*3/uL (ref 150–400)
RBC: 4.84 MIL/uL (ref 4.22–5.81)
RDW: 18.2 % — AB (ref 11.5–15.5)
WBC: 7.8 10*3/uL (ref 4.0–10.5)

## 2014-02-13 LAB — BASIC METABOLIC PANEL
ANION GAP: 7 (ref 5–15)
BUN: 27 mg/dL — AB (ref 6–23)
CHLORIDE: 98 meq/L (ref 96–112)
CO2: 35 mmol/L — ABNORMAL HIGH (ref 19–32)
Calcium: 8.8 mg/dL (ref 8.4–10.5)
Creatinine, Ser: 1.62 mg/dL — ABNORMAL HIGH (ref 0.50–1.35)
GFR, EST AFRICAN AMERICAN: 58 mL/min — AB (ref >90)
GFR, EST NON AFRICAN AMERICAN: 50 mL/min — AB (ref >90)
Glucose, Bld: 105 mg/dL — ABNORMAL HIGH (ref 70–99)
POTASSIUM: 3.7 mmol/L (ref 3.5–5.1)
Sodium: 140 mmol/L (ref 135–145)

## 2014-02-13 LAB — GLUCOSE, CAPILLARY
Glucose-Capillary: 134 mg/dL — ABNORMAL HIGH (ref 70–99)
Glucose-Capillary: 115 mg/dL — ABNORMAL HIGH (ref 70–99)

## 2014-02-13 MED ORDER — CIPROFLOXACIN HCL 500 MG PO TABS
500.0000 mg | ORAL_TABLET | Freq: Two times a day (BID) | ORAL | Status: DC
  Administered 2014-02-13 – 2014-02-15 (×4): 500 mg via ORAL
  Filled 2014-02-13 (×7): qty 1

## 2014-02-13 NOTE — Progress Notes (Signed)
Physical Therapy Treatment Patient Details Name: Dean Bowers MRN: [id number] DOB: 1970-10-26 Today's Date: 02/13/2014    History of Present Illness 44yo morbidly obese male active smoker with hx HTN, polysubstance abuse, R BKA presented 1/3 with decreased UOP, fluid retention and drainage from R BKA stump. Admitted by [facility] with acute renal failure , cellulitis of R stump and sepsis. Pt  had progressive AMS and ABG revealed respiratory acidosis VDRF 1/4-1/6. Pt s/p I&D right BKA    PT Comments    Pt continues to be limited with ambulation distance and unable to hop for step clearance.  Pt currently will benefit from CIR to prepare for d/c home.   Follow Up Recommendations  CIR     Equipment Recommendations  None recommended by PT    Recommendations for Other Services Rehab consult     Precautions / Restrictions Precautions Precautions: Fall Precaution Comments: watch vitals, R BKA Restrictions Weight Bearing Restrictions: Yes Other Position/Activity Restrictions: Cannot use prosthesis until ortho clears    Mobility  Bed Mobility                  Transfers Overall transfer level: Needs assistance Equipment used: Rolling walker (2 wheeled) Transfers: Sit to/from Stand Sit to Stand: Min assist;+2 safety/equipment Stand pivot transfers: Min assist;+2 safety/equipment       General transfer comment: Pt heavy use of UEs & required 2 trials to stand using walker to pull up  Ambulation/Gait Ambulation/Gait assistance: Min assist;+2 safety/equipment Ambulation Distance (Feet): 15 Feet Assistive device: Rolling walker (2 wheeled) Gait Pattern/deviations: Step-to pattern;Decreased stride length;Trunk flexed Gait velocity: Decreased Gait velocity interpretation: Below normal speed for age/gender General Gait Details: Pt use UE heavily to ambulate within RW with w/c following for safety.    Stairs         General stair comments: Pt continues to have  difficulty clearing floor when hopping to prepare for steps.  Pt reports having 10 inch step in home to get to restroom and only able to hop 1 inch at this time.    Wheelchair Mobility    Modified Rankin (Stroke Patients Only)       Balance Overall balance assessment: Needs assistance Sitting-balance support: Feet supported Sitting balance-Leahy Scale: Good       Standing balance-Leahy Scale: Poor Standing balance comment: Pt unable to release walker for UE                    Cognition Arousal/Alertness: Awake/alert Behavior During Therapy: WFL for tasks assessed/performed Overall Cognitive Status: Within Functional Limits for tasks assessed                      Exercises      General Comments        Pertinent Vitals/Pain Pain Assessment: 0-10 Faces Pain Scale: Hurts little more Pain Location: right LE Pain Descriptors / Indicators: Aching Pain Intervention(s): Monitored during session    Home Living                      Prior Function            PT Goals (current goals can now be found in the care plan section) Acute Rehab PT Goals Patient Stated Goal: return to fishing PT Goal Formulation: With patient/family Time For Goal Achievement: 02/25/14 Potential to Achieve Goals: Good Progress towards PT goals: Progressing toward goals    Frequency  Min 3X/week  PT Plan Current plan remains appropriate    Co-evaluation             End of Session Equipment Utilized During Treatment: Gait belt Activity Tolerance: Patient tolerated treatment well Patient left: in chair;with call bell/phone within reach     Time: 1149-1210 PT Time Calculation (min) (ACUTE ONLY): 21 min  Charges:  $Gait Training: 8-22 mins                    G Codes:      Morgen Ritacco 25-Feb-2014, 1:12 PM  Antoine Poche, [place] DPT [phone]

## 2014-02-13 NOTE — Progress Notes (Signed)
PROGRESS NOTE  Dean Bowers IWL:[id number] DOB: January 11, 1971 DOA: 02/07/2014 PCP: Wyatt Haste, MD  HPI/Recap of past 23 hours: 44 year old morbidly obese male with past oral history of hypertension, polysubstance abuse, and right BKA presented on 1/3 with drainage from right stump, acute renal failure and decreased urine output and treatment of sepsis. Following admission, patient had worsening encephalopathy and respiratory acidosis and patient was transferred to ICU. Patient was intubated on ventilator from 1/4-1/6.  Workup in ICU noted bilateral lower sternal cellulitis with fluid and air surrounding right BKA stump consistent with abscess. Seen by orthopedic surgery and underwent debridement which grew out minimal bacteria and not felt to be source. Patient's urine grew out greater than 100,000 colonies of Escherichia coli which was treated as source of infection. Patient is able to be transferred to hospitalist service and out of ICU by 1/7. His renal function has improved somewhat, but renal failure felt to be secondary to sepsis/hypotension the setting of ACE inhibitor possibly evolving into acute tubular necrosis. Has been responding to Lasix.  Patient continues to improve. Creatinine continues to trend down. No shortness of breath off of oxygen. Urinating even with Foley removed  Assessment/Plan: Active Problems:   Polysubstance abuse: Stable, counseled    Sepsis secondary to UTI: Sepsis resolved, urine grew out pansensitive Escherichia coli.  We'll change IV antibiotics to by mouth    Transaminasemia: Secondary to shock    Acute respiratory failure with hypercapnia: Secondary to sepsis. Status post ventilator and recovered. Currently volume overloaded.   Cellulitis of leg with Abscess: Status post debridement, not felt to be source of sepsis   Acute renal failure: As above. Felt to be in part from prerenal from sepsis and then on ARB needing to some acute tubular necrosis:  Slow improvement on Lasix, Creatinine hopefully resolved by tomorrow   Morbid obesity: Patient meets criteria with BMI greater than 40 Volume overload: Echocardiogram done 1/6 noting no acute evidence of systolic or diastolic dysfunction. On Lasix  Code Status: Full code  Family Communication: Left message with family  Disposition Plan: Inpatient rehabilitation, hopefully Monday. Awaiting normalization of renal function   Consultants:  Nephrology  Critical care  Physical medicine and rehabilitation  Procedures:  Echocardiogram: Poor study, no evidence of systolic or diastolic dysfunction  Status post debridement of stump wound by orthopedic surgery done 1/5  Placement of IJ line 1/4  Antibiotics:  Vancomycin 1/4-1/7  Zosyn 1/4-1/7  Rocephin 1/4-1/9  By mouth Cipro 1/9-present   Objective: BP 138/70 mmHg  Pulse 109  Temp(Src) 97.9 F (36.6 C) (Oral)  Resp 19  Ht 5\' 10"  (1.778 m)  Wt 168.284 kg (371 lb)  BMI 53.23 kg/m2  SpO2 95%  Intake/Output Summary (Last 24 hours) at 02/13/14 1719 Last data filed at 02/13/14 1400  Gross per 24 hour  Intake    720 ml  Output   1600 ml  Net   -880 ml   Filed Weights   02/10/14 0500 02/11/14 0600 02/11/14 2145  Weight: 185.521 kg (409 lb) 180.078 kg (397 lb) 168.284 kg (371 lb)    Exam:   General:  Alert and oriented 3, no acute distress  Cardiovascular: Regular rate and rhythm, S1-S2  Respiratory: Bilateral end expiratory wheeze  Abdomen: Soft, obese, nontender, positive bowel sounds  Musculoskeletal: Status post right BKA   Data Reviewed: Basic Metabolic Panel:  Recent Labs Lab 02/09/14 0445  02/10/14 1730 02/11/14 0400 02/11/14 1725 02/12/14 0819 02/13/14 0510  NA 137  < >  142 140 138 139 140  K 4.2  < > 4.3 3.4* 4.2 4.1 3.7  CL 93*  < > 94* 91* 90* 93* 98  CO2 29  < > 37* 39* 37* 35* 35*  GLUCOSE 125*  < > 93 103* 132* 103* 105*  BUN 58*  < > 62* 54* 45* 40* 27*  CREATININE 6.13*  < >  3.80* 3.09* 2.56* 2.01* 1.62*  CALCIUM 7.7*  < > 8.5 8.3* 8.5 8.7 8.8  MG  --   --  2.0 1.9 2.1 1.9  --   PHOS 8.7*  --  6.4* 6.2* 4.4 3.5  --   < > = values in this interval not displayed. Liver Function Tests:  Recent Labs Lab 02/07/14 1813 02/08/14 0620 02/09/14 0445 02/10/14 0428  AST 525* 243* 111* 63*  ALT 500* 365* 277* 184*  ALKPHOS 70 61 65 56  BILITOT 0.7 0.8 0.8 1.3*  PROT 7.4 6.7 6.5 6.5  ALBUMIN 3.4* 3.0* 2.8* 2.7*   No results for input(s): LIPASE, AMYLASE in the last 168 hours. No results for input(s): AMMONIA in the last 168 hours. CBC:  Recent Labs Lab 02/07/14 1930  02/09/14 0445 02/09/14 1241 02/10/14 0428 02/11/14 0400 02/12/14 0600 02/13/14 0510  WBC 14.2*  < > 13.6*  --  12.2* 12.1* 9.0 7.8  NEUTROABS 11.1*  --   --   --  10.5* 7.9*  --   --   HGB 10.8*  < > 10.3* 13.3 10.3* 9.8* 10.2* 10.0*  HCT 37.7*  < > 35.3* 39.0 34.2* 34.0* 36.3* 36.6*  MCV 75.4*  < > 71.9*  --  72.3* 75.2* 75.9* 75.6*  PLT 266  < > 255  --  245 232 295 276  < > = values in this interval not displayed. Cardiac Enzymes:    Recent Labs Lab 02/08/14 0620 02/08/14 1000 02/08/14 2048 02/09/14 1830 02/09/14 2228 02/10/14 0428  CKTOTAL  --   --  114  --   --   --   TROPONINI 1.20* 1.11*  --  1.42* 1.36* 1.34*   BNP (last 3 results) No results for input(s): PROBNP in the last 8760 hours. CBG:  Recent Labs Lab 02/12/14 0019 02/12/14 0816 02/12/14 1134 02/12/14 1559 02/12/14 2056  GLUCAP 108* 108* 165* 119* 121*    Recent Results (from the past 240 hour(s))  Blood culture (routine x 2)     Status: None (Preliminary result)   Collection Time: 02/07/14  6:14 PM  Result Value Ref Range Status   Specimen Description BLOOD RIGHT HAND  Final   Special Requests BOTTLES DRAWN AEROBIC AND ANAEROBIC 3 CC EACH  Final   Culture   Final           BLOOD CULTURE RECEIVED NO GROWTH TO DATE CULTURE WILL BE HELD FOR 5 DAYS BEFORE ISSUING A FINAL NEGATIVE REPORT Performed at  [facility]    Report Status PENDING  Incomplete  Blood culture (routine x 2)     Status: None (Preliminary result)   Collection Time: 02/07/14  6:15 PM  Result Value Ref Range Status   Specimen Description BLOOD RIGHT ANTECUBITAL  Final   Special Requests BOTTLES DRAWN AEROBIC ONLY 3 CC   Final   Culture   Final           BLOOD CULTURE RECEIVED NO GROWTH TO DATE CULTURE WILL BE HELD FOR 5 DAYS BEFORE ISSUING A FINAL NEGATIVE REPORT Note: Culture results may be compromised  due to an inadequate volume of blood received in culture bottles. Performed at [facility]    Report Status PENDING  Incomplete  Wound culture     Status: None   Collection Time: 02/07/14  6:52 PM  Result Value Ref Range Status   Specimen Description STUMP  Final   Special Requests NONE  Final   Gram Stain   Final    FEW WBC PRESENT, PREDOMINANTLY PMN NO SQUAMOUS EPITHELIAL CELLS SEEN NO ORGANISMS SEEN Performed at [facility]    Culture   Final    MULTIPLE ORGANISMS PRESENT, NONE PREDOMINANT Note: NO STAPHYLOCOCCUS AUREUS ISOLATED NO GROUP A STREP (S.PYOGENES) ISOLATED Performed at [facility]    Report Status 02/10/2014 FINAL  Final  Urine culture     Status: None   Collection Time: 02/07/14  8:44 PM  Result Value Ref Range Status   Specimen Description URINE, RANDOM  Final   Special Requests NONE  Final   Colony Count   Final    >=100,000 COLONIES/ML Performed at [facility]    Culture   Final    ESCHERICHIA COLI Performed at [facility]    Report Status 02/10/2014 FINAL  Final   Organism ID, Bacteria ESCHERICHIA COLI  Final      Susceptibility   Escherichia coli - MIC*    AMPICILLIN 4 SENSITIVE Sensitive     CEFAZOLIN <=4 SENSITIVE Sensitive     CEFTRIAXONE <=1 SENSITIVE Sensitive     CIPROFLOXACIN <=0.25 SENSITIVE Sensitive     GENTAMICIN <=1 SENSITIVE Sensitive     LEVOFLOXACIN <=0.12 SENSITIVE Sensitive     NITROFURANTOIN <=16  SENSITIVE Sensitive     TOBRAMYCIN <=1 SENSITIVE Sensitive     TRIMETH/SULFA <=20 SENSITIVE Sensitive     PIP/TAZO <=4 SENSITIVE Sensitive     * ESCHERICHIA COLI  MRSA PCR Screening     Status: None   Collection Time: 02/08/14  2:43 AM  Result Value Ref Range Status   MRSA by PCR NEGATIVE NEGATIVE Final    Comment:        The GeneXpert MRSA Assay (FDA approved for NASAL specimens only), is one component of a comprehensive MRSA colonization surveillance program. It is not intended to diagnose MRSA infection nor to guide or monitor treatment for MRSA infections. Performed at [facility]   Gram stain     Status: None   Collection Time: 02/09/14 12:44 PM  Result Value Ref Range Status   Specimen Description ABSCESS KNEE RIGHT  Final   Special Requests PT ON ZOSYN  Final   Gram Stain RARE WBC SEEN NO ORGANISMS SEEN   Final   Report Status 02/09/2014 FINAL  Final  Culture, routine-abscess     Status: None   Collection Time: 02/09/14 12:44 PM  Result Value Ref Range Status   Specimen Description ABSCESS KNEE RIGHT  Final   Special Requests PT ON ZOSYN  Final   Gram Stain   Final    RARE WBC NO ORGANISMS SEEN Performed at [facility] Performed at [facility]    Culture   Final    NO GROWTH 3 DAYS Performed at [facility]    Report Status 02/13/2014 FINAL  Final  Anaerobic culture     Status: None (Preliminary result)   Collection Time: 02/09/14 12:44 PM  Result Value Ref Range Status   Specimen Description ABSCESS KNEE RIGHT  Final   Special Requests PT ON ZOSYN  Final  Gram Stain   Final    NO WBC SEEN NO SQUAMOUS EPITHELIAL CELLS SEEN NO ORGANISMS SEEN Performed at [facility]    Culture   Final    NO ANAEROBES ISOLATED; CULTURE IN PROGRESS FOR 5 DAYS Performed at [facility]    Report Status PENDING  Incomplete  Tissue culture     Status: None   Collection Time: 02/09/14 12:46 PM  Result Value Ref Range  Status   Specimen Description TISSUE KNEE RIGHT  Final   Special Requests BELOW KNEE AMPUTATION RIGHT   Final   Gram Stain   Final    RARE WBC PRESENT, PREDOMINANTLY PMN NO ORGANISMS SEEN Performed at [facility]    Culture   Final    NO GROWTH 3 DAYS Performed at [facility]    Report Status 02/12/2014 FINAL  Final  Anaerobic culture     Status: None (Preliminary result)   Collection Time: 02/09/14 12:46 PM  Result Value Ref Range Status   Specimen Description TISSUE KNEE RIGHT  Final   Special Requests BELOW KNEE AMPUTATION RIGHT  Final   Gram Stain   Final    RARE WBC PRESENT, PREDOMINANTLY PMN NO ORGANISMS SEEN Performed at [facility]    Culture   Final    NO ANAEROBES ISOLATED; CULTURE IN PROGRESS FOR 5 DAYS Performed at [facility]    Report Status PENDING  Incomplete     Studies: No results found.  Scheduled Meds: . antiseptic oral rinse  7 mL Mouth Rinse QID  . aspirin EC  81 mg Oral Daily  . budesonide  0.25 mg Nebulization 4 times per day  . cefTRIAXone (ROCEPHIN)  IV  1 g Intravenous Q24H  . chlorhexidine  15 mL Mouth Rinse BID  . gabapentin  600 mg Oral BID  . heparin subcutaneous  5,000 Units Subcutaneous 3 times per day  . insulin aspart  0-9 Units Subcutaneous TID WC  . ipratropium-albuterol  3 mL Nebulization Q6H  . multivitamin with minerals  1 tablet Oral Daily  . sodium chloride  3 mL Intravenous Q12H    Continuous Infusions: . sodium chloride 75 mL/hr at 02/13/14 0500     Time spent: 25 minutes  [facility] Pager [phone]. If 7PM-7AM, please contact night-coverage at [email or web address], password [facility] 02/13/2014, 5:19 PM  LOS: 6 days

## 2014-02-14 DIAGNOSIS — R740 Nonspecific elevation of levels of transaminase and lactic acid dehydrogenase [LDH]: Secondary | ICD-10-CM

## 2014-02-14 DIAGNOSIS — E119 Type 2 diabetes mellitus without complications: Secondary | ICD-10-CM

## 2014-02-14 LAB — ANAEROBIC CULTURE: GRAM STAIN: NONE SEEN

## 2014-02-14 LAB — BASIC METABOLIC PANEL
Anion gap: 7 (ref 5–15)
BUN: 24 mg/dL — ABNORMAL HIGH (ref 6–23)
CO2: 30 mmol/L (ref 19–32)
Calcium: 9 mg/dL (ref 8.4–10.5)
Chloride: 103 mEq/L (ref 96–112)
Creatinine, Ser: 1.48 mg/dL — ABNORMAL HIGH (ref 0.50–1.35)
GFR calc Af Amer: 65 mL/min — ABNORMAL LOW (ref >90)
GFR calc non Af Amer: 56 mL/min — ABNORMAL LOW (ref >90)
Glucose, Bld: 97 mg/dL (ref 70–99)
POTASSIUM: 3.6 mmol/L (ref 3.5–5.1)
Sodium: 140 mmol/L (ref 135–145)

## 2014-02-14 LAB — GLUCOSE, CAPILLARY
GLUCOSE-CAPILLARY: 108 mg/dL — AB (ref 70–99)
GLUCOSE-CAPILLARY: 126 mg/dL — AB (ref 70–99)
GLUCOSE-CAPILLARY: 133 mg/dL — AB (ref 70–99)
Glucose-Capillary: 103 mg/dL — ABNORMAL HIGH (ref 70–99)
Glucose-Capillary: 104 mg/dL — ABNORMAL HIGH (ref 70–99)
Glucose-Capillary: 106 mg/dL — ABNORMAL HIGH (ref 70–99)
Glucose-Capillary: 107 mg/dL — ABNORMAL HIGH (ref 70–99)
Glucose-Capillary: 128 mg/dL — ABNORMAL HIGH (ref 70–99)
Glucose-Capillary: 141 mg/dL — ABNORMAL HIGH (ref 70–99)
Glucose-Capillary: 93 mg/dL (ref 70–99)
Glucose-Capillary: 96 mg/dL (ref 70–99)
Glucose-Capillary: 98 mg/dL (ref 70–99)

## 2014-02-14 LAB — CULTURE, BLOOD (ROUTINE X 2)
Culture: NO GROWTH
Culture: NO GROWTH

## 2014-02-14 MED ORDER — ALPRAZOLAM 0.25 MG PO TABS
0.2500 mg | ORAL_TABLET | Freq: Every day | ORAL | Status: DC
  Administered 2014-02-14: 0.25 mg via ORAL
  Filled 2014-02-14: qty 1

## 2014-02-14 NOTE — Progress Notes (Signed)
PROGRESS NOTE  Dean Bowers EGB:[id number] DOB: October 05, 1970 DOA: 02/07/2014 PCP: Wyatt Haste, MD  HPI/Recap of past 59 hours: 44 year old morbidly obese male with past oral history of hypertension, polysubstance abuse, and right BKA presented on 1/3 with drainage from right stump, acute renal failure and decreased urine output and treatment of sepsis. Following admission, patient had worsening encephalopathy and respiratory acidosis and patient was transferred to ICU. Patient was intubated on ventilator from 1/4-1/6.  Workup in ICU noted bilateral lower sternal cellulitis with fluid and air surrounding right BKA stump consistent with abscess. Seen by orthopedic surgery and underwent debridement which grew out minimal bacteria and not felt to be source. Patient's urine grew out greater than 100,000 colonies of Escherichia coli which was treated as source of infection. Patient is able to be transferred to hospitalist service and out of ICU by 1/7. His renal function has improved somewhat, but renal failure felt to be secondary to sepsis/hypotension the setting of ACE inhibitor possibly evolving into acute tubular necrosis. Has been responding to Lasix.  Patient continues to do well. Creatinine continues to come down hydration. Able to closely wean off oxygen, but had some brief desaturation. Has rough time at night with CPap. He himself has no complaints.  Assessment/Plan: Active Problems:   Polysubstance abuse: Stable, counseled  Diabetes mellitus: A1c at 6.5. New diagnosis, diet controlled    Sepsis secondary to UTI: Sepsis resolved, urine grew out pansensitive Escherichia coli.  Antibiotics changed to by mouth    Transaminasemia: Secondary to shock    Acute respiratory failure with hypercapnia: Secondary to sepsis. Status post ventilator and recovered. Currently volume overloaded.    Cellulitis of leg with Abscess: Status post debridement, not felt to be source of sepsis   Acute renal failure: As above. Felt to be in part from prerenal from sepsis and then on ARB needing to some acute tubular necrosis: Continue gentle IV fluids and hopefully resolved by tomorrow  Sleep apnea: We'll try very low-dose Xanax to keep patient, and see if he can tolerate mask tonight   Morbid obesity: Patient meets criteria with BMI greater than 40 Volume overload: Echocardiogram done 1/6 noting no acute evidence of systolic or diastolic dysfunction. On Lasix  Code Status: Full code  Family Communication: Left message with family  Disposition Plan: Inpatient rehabilitation, hopefully Monday. Awaiting normalization of renal function   Consultants:  Nephrology  Critical care  Physical medicine and rehabilitation  Procedures:  Echocardiogram: Poor study, no evidence of systolic or diastolic dysfunction  Status post debridement of stump wound by orthopedic surgery done 1/5  Placement of IJ line 1/4  Antibiotics:  Vancomycin 1/4-1/7  Zosyn 1/4-1/7  Rocephin 1/4-1/9  By mouth Cipro 1/9-1/13   Objective: BP 126/64 mmHg  Pulse 102  Temp(Src) 98.9 F (37.2 C) (Oral)  Resp 20  Ht 5\' 10"  (1.778 m)  Wt 168.284 kg (371 lb)  BMI 53.23 kg/m2  SpO2 92%  Intake/Output Summary (Last 24 hours) at 02/14/14 0944 Last data filed at 02/14/14 0657  Gross per 24 hour  Intake    960 ml  Output   2950 ml  Net  -1990 ml   Filed Weights   02/10/14 0500 02/11/14 0600 02/11/14 2145  Weight: 185.521 kg (409 lb) 180.078 kg (397 lb) 168.284 kg (371 lb)    Exam:   General:  Alert and oriented 3, no acute distress  Cardiovascular: Regular rate and rhythm, S1-S2  Respiratory: Clear to auscultation bilaterally  Abdomen: Soft, obese,  nontender, positive bowel sounds  Musculoskeletal: Status post right BKA   Data Reviewed: Basic Metabolic Panel:  Recent Labs Lab 02/09/14 0445  02/10/14 1730 02/11/14 0400 02/11/14 1725 02/12/14 0819 02/13/14 0510 02/14/14 0539   NA 137  < > 142 140 138 139 140 140  K 4.2  < > 4.3 3.4* 4.2 4.1 3.7 3.6  CL 93*  < > 94* 91* 90* 93* 98 103  CO2 29  < > 37* 39* 37* 35* 35* 30  GLUCOSE 125*  < > 93 103* 132* 103* 105* 97  BUN 58*  < > 62* 54* 45* 40* 27* 24*  CREATININE 6.13*  < > 3.80* 3.09* 2.56* 2.01* 1.62* 1.48*  CALCIUM 7.7*  < > 8.5 8.3* 8.5 8.7 8.8 9.0  MG  --   --  2.0 1.9 2.1 1.9  --   --   PHOS 8.7*  --  6.4* 6.2* 4.4 3.5  --   --   < > = values in this interval not displayed. Liver Function Tests:  Recent Labs Lab 02/07/14 1813 02/08/14 0620 02/09/14 0445 02/10/14 0428  AST 525* 243* 111* 63*  ALT 500* 365* 277* 184*  ALKPHOS 70 61 65 56  BILITOT 0.7 0.8 0.8 1.3*  PROT 7.4 6.7 6.5 6.5  ALBUMIN 3.4* 3.0* 2.8* 2.7*   No results for input(s): LIPASE, AMYLASE in the last 168 hours. No results for input(s): AMMONIA in the last 168 hours. CBC:  Recent Labs Lab 02/07/14 1930  02/09/14 0445 02/09/14 1241 02/10/14 0428 02/11/14 0400 02/12/14 0600 02/13/14 0510  WBC 14.2*  < > 13.6*  --  12.2* 12.1* 9.0 7.8  NEUTROABS 11.1*  --   --   --  10.5* 7.9*  --   --   HGB 10.8*  < > 10.3* 13.3 10.3* 9.8* 10.2* 10.0*  HCT 37.7*  < > 35.3* 39.0 34.2* 34.0* 36.3* 36.6*  MCV 75.4*  < > 71.9*  --  72.3* 75.2* 75.9* 75.6*  PLT 266  < > 255  --  245 232 295 276  < > = values in this interval not displayed. Cardiac Enzymes:    Recent Labs Lab 02/08/14 0620 02/08/14 1000 02/08/14 2048 02/09/14 1830 02/09/14 2228 02/10/14 0428  CKTOTAL  --   --  114  --   --   --   TROPONINI 1.20* 1.11*  --  1.42* 1.36* 1.34*   BNP (last 3 results) No results for input(s): PROBNP in the last 8760 hours. CBG:  Recent Labs Lab 02/13/14 1839 02/13/14 2049 02/14/14 02/14/14 0412 02/14/14 0900  GLUCAP 134* 115* 98 93 141*    Recent Results (from the past 240 hour(s))  Blood culture (routine x 2)     Status: None (Preliminary result)   Collection Time: 02/07/14  6:14 PM  Result Value Ref Range Status    Specimen Description BLOOD RIGHT HAND  Final   Special Requests BOTTLES DRAWN AEROBIC AND ANAEROBIC 3 CC EACH  Final   Culture   Final           BLOOD CULTURE RECEIVED NO GROWTH TO DATE CULTURE WILL BE HELD FOR 5 DAYS BEFORE ISSUING A FINAL NEGATIVE REPORT Performed at [facility]    Report Status PENDING  Incomplete  Blood culture (routine x 2)     Status: None (Preliminary result)   Collection Time: 02/07/14  6:15 PM  Result Value Ref Range Status   Specimen Description BLOOD RIGHT ANTECUBITAL  Final  Special Requests BOTTLES DRAWN AEROBIC ONLY 3 CC   Final   Culture   Final           BLOOD CULTURE RECEIVED NO GROWTH TO DATE CULTURE WILL BE HELD FOR 5 DAYS BEFORE ISSUING A FINAL NEGATIVE REPORT Note: Culture results may be compromised due to an inadequate volume of blood received in culture bottles. Performed at [facility]    Report Status PENDING  Incomplete  Wound culture     Status: None   Collection Time: 02/07/14  6:52 PM  Result Value Ref Range Status   Specimen Description STUMP  Final   Special Requests NONE  Final   Gram Stain   Final    FEW WBC PRESENT, PREDOMINANTLY PMN NO SQUAMOUS EPITHELIAL CELLS SEEN NO ORGANISMS SEEN Performed at [facility]    Culture   Final    MULTIPLE ORGANISMS PRESENT, NONE PREDOMINANT Note: NO STAPHYLOCOCCUS AUREUS ISOLATED NO GROUP A STREP (S.PYOGENES) ISOLATED Performed at [facility]    Report Status 02/10/2014 FINAL  Final  Urine culture     Status: None   Collection Time: 02/07/14  8:44 PM  Result Value Ref Range Status   Specimen Description URINE, RANDOM  Final   Special Requests NONE  Final   Colony Count   Final    >=100,000 COLONIES/ML Performed at [facility]    Culture   Final    ESCHERICHIA COLI Performed at [facility]    Report Status 02/10/2014 FINAL  Final   Organism ID, Bacteria ESCHERICHIA COLI  Final      Susceptibility   Escherichia coli - MIC*     AMPICILLIN 4 SENSITIVE Sensitive     CEFAZOLIN <=4 SENSITIVE Sensitive     CEFTRIAXONE <=1 SENSITIVE Sensitive     CIPROFLOXACIN <=0.25 SENSITIVE Sensitive     GENTAMICIN <=1 SENSITIVE Sensitive     LEVOFLOXACIN <=0.12 SENSITIVE Sensitive     NITROFURANTOIN <=16 SENSITIVE Sensitive     TOBRAMYCIN <=1 SENSITIVE Sensitive     TRIMETH/SULFA <=20 SENSITIVE Sensitive     PIP/TAZO <=4 SENSITIVE Sensitive     * ESCHERICHIA COLI  MRSA PCR Screening     Status: None   Collection Time: 02/08/14  2:43 AM  Result Value Ref Range Status   MRSA by PCR NEGATIVE NEGATIVE Final    Comment:        The GeneXpert MRSA Assay (FDA approved for NASAL specimens only), is one component of a comprehensive MRSA colonization surveillance program. It is not intended to diagnose MRSA infection nor to guide or monitor treatment for MRSA infections. Performed at [facility]   Gram stain     Status: None   Collection Time: 02/09/14 12:44 PM  Result Value Ref Range Status   Specimen Description ABSCESS KNEE RIGHT  Final   Special Requests PT ON ZOSYN  Final   Gram Stain RARE WBC SEEN NO ORGANISMS SEEN   Final   Report Status 02/09/2014 FINAL  Final  Culture, routine-abscess     Status: None   Collection Time: 02/09/14 12:44 PM  Result Value Ref Range Status   Specimen Description ABSCESS KNEE RIGHT  Final   Special Requests PT ON ZOSYN  Final   Gram Stain   Final    RARE WBC NO ORGANISMS SEEN Performed at [facility] Performed at [facility]   Final    NO GROWTH 3 DAYS Performed at [facility]    Report Status 02/13/2014 FINAL  Final  Anaerobic culture     Status: None (Preliminary result)   Collection Time: 02/09/14 12:44 PM  Result Value Ref Range Status   Specimen Description ABSCESS KNEE RIGHT  Final   Special Requests PT ON ZOSYN  Final   Gram Stain   Final    NO WBC SEEN NO SQUAMOUS EPITHELIAL CELLS SEEN NO ORGANISMS SEEN Performed at  [facility]    Culture   Final    NO ANAEROBES ISOLATED; CULTURE IN PROGRESS FOR 5 DAYS Performed at [facility]    Report Status PENDING  Incomplete  Tissue culture     Status: None   Collection Time: 02/09/14 12:46 PM  Result Value Ref Range Status   Specimen Description TISSUE KNEE RIGHT  Final   Special Requests BELOW KNEE AMPUTATION RIGHT   Final   Gram Stain   Final    RARE WBC PRESENT, PREDOMINANTLY PMN NO ORGANISMS SEEN Performed at [facility]    Culture   Final    NO GROWTH 3 DAYS Performed at [facility]    Report Status 02/12/2014 FINAL  Final  Anaerobic culture     Status: None (Preliminary result)   Collection Time: 02/09/14 12:46 PM  Result Value Ref Range Status   Specimen Description TISSUE KNEE RIGHT  Final   Special Requests BELOW KNEE AMPUTATION RIGHT  Final   Gram Stain   Final    RARE WBC PRESENT, PREDOMINANTLY PMN NO ORGANISMS SEEN Performed at [facility]    Culture   Final    NO ANAEROBES ISOLATED; CULTURE IN PROGRESS FOR 5 DAYS Performed at [facility]    Report Status PENDING  Incomplete     Studies: No results found.  Scheduled Meds: . ALPRAZolam  0.25 mg Oral QHS  . antiseptic oral rinse  7 mL Mouth Rinse QID  . aspirin EC  81 mg Oral Daily  . budesonide  0.25 mg Nebulization 4 times per day  . chlorhexidine  15 mL Mouth Rinse BID  . ciprofloxacin  500 mg Oral BID  . gabapentin  600 mg Oral BID  . heparin subcutaneous  5,000 Units Subcutaneous 3 times per day  . insulin aspart  0-9 Units Subcutaneous TID WC  . ipratropium-albuterol  3 mL Nebulization Q6H  . multivitamin with minerals  1 tablet Oral Daily  . sodium chloride  3 mL Intravenous Q12H    Continuous Infusions: . sodium chloride 75 mL/hr at 02/13/14 0500     Time spent: 15 minutes  [facility] Pager [phone]. If 7PM-7AM, please contact night-coverage at [email or web address], password  [facility] 02/14/2014, 9:44 AM  LOS: 7 days

## 2014-02-14 NOTE — Progress Notes (Signed)
Placed patient on CPAP 10cmH20 with 5L 02 bleed in.  Patient is tolerating well at this time.

## 2014-02-15 ENCOUNTER — Other Ambulatory Visit: Payer: Self-pay | Admitting: Family Medicine

## 2014-02-15 LAB — GLUCOSE, CAPILLARY
GLUCOSE-CAPILLARY: 103 mg/dL — AB (ref 70–99)
GLUCOSE-CAPILLARY: 103 mg/dL — AB (ref 70–99)
Glucose-Capillary: 104 mg/dL — ABNORMAL HIGH (ref 70–99)
Glucose-Capillary: 97 mg/dL (ref 70–99)

## 2014-02-15 LAB — COMPREHENSIVE METABOLIC PANEL
ALBUMIN: 2.8 g/dL — AB (ref 3.5–5.2)
ALT: 86 U/L — AB (ref 0–53)
ANION GAP: 7 (ref 5–15)
AST: 55 U/L — AB (ref 0–37)
Alkaline Phosphatase: 52 U/L (ref 39–117)
BILIRUBIN TOTAL: 0.4 mg/dL (ref 0.3–1.2)
BUN: 22 mg/dL (ref 6–23)
CHLORIDE: 105 meq/L (ref 96–112)
CO2: 29 mmol/L (ref 19–32)
Calcium: 8.8 mg/dL (ref 8.4–10.5)
Creatinine, Ser: 1.55 mg/dL — ABNORMAL HIGH (ref 0.50–1.35)
GFR calc non Af Amer: 53 mL/min — ABNORMAL LOW (ref >90)
GFR, EST AFRICAN AMERICAN: 61 mL/min — AB (ref >90)
Glucose, Bld: 99 mg/dL (ref 70–99)
Potassium: 3.5 mmol/L (ref 3.5–5.1)
SODIUM: 141 mmol/L (ref 135–145)
Total Protein: 6.4 g/dL (ref 6.0–8.3)

## 2014-02-15 LAB — CBC
HCT: 34.8 % — ABNORMAL LOW (ref 39.0–52.0)
Hemoglobin: 9.6 g/dL — ABNORMAL LOW (ref 13.0–17.0)
MCH: 20.6 pg — ABNORMAL LOW (ref 26.0–34.0)
MCHC: 27.6 g/dL — ABNORMAL LOW (ref 30.0–36.0)
MCV: 74.7 fL — ABNORMAL LOW (ref 78.0–100.0)
Platelets: 313 10*3/uL (ref 150–400)
RBC: 4.66 MIL/uL (ref 4.22–5.81)
RDW: 18.7 % — AB (ref 11.5–15.5)
WBC: 8.4 10*3/uL (ref 4.0–10.5)

## 2014-02-15 MED ORDER — FUROSEMIDE 20 MG PO TABS: 20.0000 mg | ORAL_TABLET | Freq: Two times a day (BID) | ORAL | Status: DC

## 2014-02-15 MED ORDER — IPRATROPIUM-ALBUTEROL 0.5-2.5 (3) MG/3ML IN SOLN: 3.0000 mL | Freq: Two times a day (BID) | RESPIRATORY_TRACT | Status: DC

## 2014-02-15 MED ORDER — ASPIRIN 81 MG PO TBEC: 81.0000 mg | DELAYED_RELEASE_TABLET | Freq: Every day | ORAL | Status: AC

## 2014-02-15 MED ORDER — DILTIAZEM HCL ER COATED BEADS 120 MG PO CP24: 120.0000 mg | ORAL_CAPSULE | Freq: Every day | ORAL | Status: DC

## 2014-02-15 MED ORDER — ALBUTEROL SULFATE (2.5 MG/3ML) 0.083% IN NEBU: 2.5000 mg | INHALATION_SOLUTION | RESPIRATORY_TRACT | Status: DC | PRN

## 2014-02-15 MED ORDER — VITAMIN D (ERGOCALCIFEROL) 1.25 MG (50000 UNIT) PO CAPS
50000.0000 [IU] | ORAL_CAPSULE | ORAL | Status: DC
  Administered 2014-02-15: 50000 [IU] via ORAL
  Filled 2014-02-15: qty 1

## 2014-02-15 MED ORDER — CIPROFLOXACIN HCL 500 MG PO TABS: 500.0000 mg | ORAL_TABLET | Freq: Two times a day (BID) | ORAL | Status: AC

## 2014-02-15 NOTE — Progress Notes (Signed)
I met with pt and his wife in the hospital lobby as pt is using his power chair to mobilize outside. He is doing functionally well for flat surfaces to stand pivot. He feels with a bariatric walker he can stand and hop steps needed at home. I can not justify an inpt rehab admission. Recommend Home with oupt follow up for he will not be home bound. I will update RN CM. [phone]

## 2014-02-15 NOTE — Progress Notes (Signed)
Occupational Therapy Treatment Patient Details Name: Dean Bowers MRN: [id number] DOB: Nov 23, 1970 Today's Date: 02/15/2014    History of present illness 44yo morbidly obese male active smoker with hx HTN, polysubstance abuse, R BKA presented 1/3 with decreased UOP, fluid retention and drainage from R BKA stump. Admitted by [facility] at [facility] with acute renal failure , cellulitis of R stump and sepsis. Pt  had progressive AMS and ABG revealed respiratory acidosis VDRF 1/4-1/6. Pt s/p I&D right BKA   OT comments  Session focused on being able to access the step into his home.  Pt and wife comfortable with pt's ability to get into the house using his walker and to remain on the main level of the home predominantly rather than attempting to routinely access other levels.  Pt educated in safety and pacing.  02 noted to drop to mid 80s with activity.    Follow Up Recommendations  Home health OT;Supervision/Assistance - 24 hour    Equipment Recommendations       Recommendations for Other Services      Precautions / Restrictions Precautions Precautions: Fall Precaution Comments: watch vitals, R BKA Restrictions Weight Bearing Restrictions: Yes Other Position/Activity Restrictions: Cannot use prosthesis until ortho clears       Mobility Bed Mobility Overal bed mobility: Modified Independent Bed Mobility: Supine to Sit;Sit to Supine           General bed mobility comments: Pt able to transition to/from EOB with no assistance and no use of bed rails. Increased time required to complete full transfers.   Transfers Overall transfer level: Modified independent Equipment used: Rolling walker (2 wheeled) Transfers: Sit to/from [facility] Sit to Stand: Supervision;+2 safety/equipment Stand pivot transfers: Supervision;+2 safety/equipment       General transfer comment: Pt able to power-up to full standing position without assistance. +2 available for safety, however did  not require hands-on assist.    Balance Overall balance assessment: Needs assistance Sitting-balance support: Feet supported;No upper extremity supported Sitting balance-Leahy Scale: Good     Standing balance support: Bilateral upper extremity supported;During functional activity Standing balance-Leahy Scale: Poor Standing balance comment: Pt requires UE support to maintain standing balance at this time.                    ADL Overall ADL's : Needs assistance/impaired                         Toilet Transfer: Stand-pivot;BSC;Requires wide/bariatric;Supervision/safety             General ADL Comments: Pt with plans to stay on one level of his home and sponge bathe and use urinal or BSC as needed.  Pt demonstrated the ability to hop up one step similar to that of the home. Plans to scoot up step to access bathroom for showering on tub transfer bench. Instructed pt in pacing for energy conservation and safety.  Wife reports pt with tendency to rush.      Vision                     Perception     Praxis      Cognition   Behavior During Therapy: [facility] for tasks assessed/performed Overall Cognitive Status: Within Functional Limits for tasks assessed                       Extremity/Trunk Assessment  Exercises     Shoulder Instructions       General Comments      Pertinent Vitals/ Pain       Pain Assessment: No/denies pain  Home Living                                          Prior Functioning/Environment              Frequency Min 2X/week     Progress Toward Goals  OT Goals(current goals can now be found in the care plan section)  Progress towards OT goals: Progressing toward goals  Acute Rehab OT Goals Patient Stated Goal: return to fishing  Plan Discharge plan needs to be updated    Co-evaluation    PT/OT/SLP Co-Evaluation/Treatment: Yes Reason for Co-Treatment: For  patient/therapist safety PT goals addressed during session: Mobility/safety with mobility;Balance;Proper use of DME OT goals addressed during session: Proper use of [employer] equipment and DME      End of Session Equipment Utilized During Treatment: Rolling walker   Activity Tolerance Patient tolerated treatment well   Patient Left in bed;with call bell/phone within reach;with family/visitor present   Nurse Communication  Able to get residual limb wet in shower at home        Time: 1315-1340 OT Time Calculation (min): 25 min  Charges: OT General Charges $OT Visit: 1 Procedure OT Treatments $Therapeutic Activity: 8-22 mins  Malka So 02/15/2014, 3:33 PM  [phone]

## 2014-02-15 NOTE — Progress Notes (Signed)
NCM spoke to pt and offered choice for [facility]. Pt requested [facility] for HH. Contacted [facility] for neb machine for home and [facility] for scheduled dc today. Jonnie Finner RN CCM Case Mgmt phone [phone]

## 2014-02-15 NOTE — Progress Notes (Signed)
Physical Therapy Treatment Patient Details Name: Dean Bowers MRN: [id number] DOB: 15-Dec-1970 Today's Date: 02/15/2014    History of Present Illness 44yo morbidly obese male active smoker with hx HTN, polysubstance abuse, R BKA presented 1/3 with decreased UOP, fluid retention and drainage from R BKA stump. Admitted by [facility] at [facility] with acute renal failure , cellulitis of R stump and sepsis. Pt  had progressive AMS and ABG revealed respiratory acidosis VDRF 1/4-1/6. Pt s/p I&D right BKA    PT Comments    Pt progressing towards physical therapy goals. Was able to negotiate 1 step x2 during session. Pt reports feeling more secure with bariatric RW vs. standard size. Pt's wife present during session and neither she nor patient had concerns with returning home and negotiating the layout of their home. A good portion of the session was focused around pt education, discussing safety at home and with steps, energy conservation, and the transport home. Pt will have his handicap accessible truck with a chair lift, and power chair for transition out of hospital to home.   Pt continued to demonstrate decreased O2 sats to 85% on RA during mobility.   Follow Up Recommendations  CIR     Equipment Recommendations  None recommended by PT    Recommendations for Other Services Rehab consult     Precautions / Restrictions Precautions Precautions: Fall Precaution Comments: watch vitals, R BKA Restrictions Weight Bearing Restrictions: Yes Other Position/Activity Restrictions: Cannot use prosthesis until ortho clears    Mobility  Bed Mobility Overal bed mobility: Modified Independent Bed Mobility: Supine to Sit;Sit to Supine           General bed mobility comments: Pt able to transition to/from EOB with no assistance and no use of bed rails. Increased time required to complete full transfers.   Transfers Overall transfer level: Modified independent Equipment used: Rolling walker (2 wheeled)  (Bariatric) Transfers: Sit to/from [facility] Sit to Stand: Supervision;+2 safety/equipment Stand pivot transfers: Supervision;+2 safety/equipment       General transfer comment: Pt able to power-up to full standing position without assistance. +2 available for safety, however did not require hands-on assist.  Ambulation/Gait Ambulation/Gait assistance: Min guard Ambulation Distance (Feet): 3 Feet Assistive device: Rolling walker (2 wheeled) (Bariatric) Gait Pattern/deviations: Step-to pattern;Decreased stride length Gait velocity: Decreased Gait velocity interpretation: Below normal speed for age/gender General Gait Details: Pt with hop-to gait pattern from bed<>step. Pt with bariatric RW and reports feeling more comfortable bearing weight through this vs. standard RW.    Stairs Stairs: Yes Stairs assistance: Min guard Stair Management: With walker;Forwards Number of Stairs: 2 General stair comments: Pt was able to clear the step forwards with RW, and then return to floor with backwards hop. Pt completed this x2. Reports confidence in entering home upon d/c.   Wheelchair Mobility    Modified Rankin (Stroke Patients Only)       Balance Overall balance assessment: Needs assistance Sitting-balance support: Feet supported;No upper extremity supported Sitting balance-Leahy Scale: Good     Standing balance support: Bilateral upper extremity supported;During functional activity Standing balance-Leahy Scale: Poor Standing balance comment: Pt requires UE support to maintain standing balance at this time.                     Cognition Arousal/Alertness: Awake/alert Behavior During Therapy: WFL for tasks assessed/performed Overall Cognitive Status: Within Functional Limits for tasks assessed  Exercises      General Comments        Pertinent Vitals/Pain Pain Assessment: No/denies pain    Home Living                       Prior Function            PT Goals (current goals can now be found in the care plan section) Acute Rehab PT Goals Patient Stated Goal: return to fishing PT Goal Formulation: With patient/family Time For Goal Achievement: 02/25/14 Potential to Achieve Goals: Good Progress towards PT goals: Progressing toward goals    Frequency  Min 3X/week    PT Plan Current plan remains appropriate    Co-evaluation PT/OT/SLP Co-Evaluation/Treatment: Yes Reason for Co-Treatment: For patient/therapist safety PT goals addressed during session: Mobility/safety with mobility;Balance;Proper use of DME       End of Session Equipment Utilized During Treatment: Gait belt Activity Tolerance: Patient tolerated treatment well Patient left: in bed;with call bell/phone within reach;with family/visitor present     Time: 1317-1340 PT Time Calculation (min) (ACUTE ONLY): 23 min  Charges:  $Gait Training: 8-22 mins                    G Codes:      Rolinda Roan 02-23-14, 2:02 PM  Rolinda Roan, PT, DPT Acute Rehabilitation Services Pager: [phone]

## 2014-02-15 NOTE — Progress Notes (Signed)
Patient stated he did not want to wear CPAP tonight.  Patient is aware to have RN call RT if he changes his mind.

## 2014-02-15 NOTE — Discharge Summary (Addendum)
Discharge Summary  Dean Bowers WNU:[id number] DOB: 02/21/1970  PCP: Wyatt Haste, MD  Admit date: 02/07/2014 Discharge date: 02/15/2014  Time spent: 25 minutes  Recommendations for Outpatient Follow-up:  1. New medication: Cipro 500 mg by mouth twice a day 2 more days 2. Medication change: Losartan/HCTZ being discontinued 3. Medication change: Patient's Lasix being decreased to 20 mg by mouth daily 4. Medication change: Patient advised to discontinue qsymia given concerns for metabolic acidosis  Discharge Diagnoses:  Active Hospital Problems   Diagnosis Date Noted  . Diabetes mellitus without complication 03/47/4259  . Abdominal pain   . Abscess   . Acute renal failure   . Acute respiratory failure   . Acute respiratory failure with hypercapnia 02/08/2014  . AKI (acute kidney injury) 02/07/2014  . Sepsis 02/07/2014  . Transaminasemia 02/07/2014  . Cellulitis, leg 02/07/2014  . Polysubstance abuse 05/30/2012    Resolved Hospital Problems   Diagnosis Date Noted Date Resolved  No resolved problems to display.    Discharge Condition: Improved, discharged home with home health PT  Diet recommendation: Carb modified, heart healthy  Filed Weights   02/11/14 2145 02/14/14 2025 02/15/14 0152  Weight: 168.284 kg (371 lb) 170.2 kg (375 lb 3.6 oz) 170.2 kg (375 lb 3.6 oz)    History of present illness:  44 year old morbidly obese male with past oral history of hypertension, polysubstance abuse, and right BKA presented on 1/3 with drainage from right stump, acute renal failure and decreased urine output and treatment of sepsis. Following admission, patient had worsening encephalopathy and respiratory acidosis and patient was transferred to ICU. Patient was intubated on ventilator from 1/4-1/6.  Hospital Course:  Workup in ICU noted bilateral lower sternal cellulitis with fluid and air surrounding right BKA stump consistent with abscess. Seen by orthopedic surgery and  underwent debridement which grew out minimal bacteria and not felt to be source. Patient's urine grew out greater than 100,000 colonies of Escherichia coli which was treated as source of infection. Patient is able to be transferred to hospitalist service and out of ICU by 1/7. His renal function has improved somewhat, but renal failure felt to be secondary to sepsis/hypotension the setting of ACE inhibitor possibly evolving into acute tubular necrosis. Has been responding to Lasix.  Polysubstance abuse: Stable, counseled  Diabetes mellitus: A1c at 6.5. New diagnosis, diet controlled   Sepsis secondary to UTI: Sepsis resolved, urine grew out pansensitive Escherichia coli. Antibiotics changed to by mouth and patient discharged with Cipro 2 more days for total of 10 days of therapy   Transaminasemia: Secondary to shock   Acute respiratory failure with hypercapnia: Secondary to sepsis. Status post ventilator and recovered.   Cellulitis of leg with Abscess: Status post debridement, not felt to be source of sepsis   Acute renal failure: As above. Felt to be in part from prerenal from sepsis and then on ARB needing to some acute tubular necrosis: IV fluids were continued and by day of discharge, creatinine down to 1.55, close to patient's baseline  Sleep apnea: Patient has issues with toleration of C Pap mask at times,  Morbid obesity: Patient meets criteria with BMI greater than 40  Volume overload: Echocardiogram done 1/6 noting no acute evidence of systolic or diastolic dysfunction. He will resume Lasix at 20 g twice a day on discharge   Procedures:  Placement of IJ line 1/4-1/9  Status post excisional debridement of stump wound by orthopedic surgery done 1/5  Echocardiogram noting no evidence of systolic or  diastolic dysfunction, although poor study  Consultations:  Nephrology  Critical care  Physical medicine and rehabilitation  Discharge Exam: BP 159/77 mmHg  Pulse 80   Temp(Src) 98.3 F (36.8 C) (Oral)  Resp 24  Ht 5\' 10"  (1.778 m)  Wt 170.2 kg (375 lb 3.6 oz)  BMI 53.84 kg/m2  SpO2 96%  General: Alert and oriented 3, no acute distress Cardiovascular: Regular rate and rhythm, S1-S2 Respiratory: Clear to auscultation bilaterally  Discharge Instructions You were cared for by a hospitalist during your hospital stay. If you have any questions about your discharge medications or the care you received while you were in the hospital after you are discharged, you can call the unit and asked to speak with the hospitalist on call if the hospitalist that took care of you is not available. Once you are discharged, your primary care physician will handle any further medical issues. Please note that NO REFILLS for any discharge medications will be authorized once you are discharged, as it is imperative that you return to your primary care physician (or establish a relationship with a primary care physician if you do not have one) for your aftercare needs so that they can reassess your need for medications and monitor your lab values.  Discharge Instructions    Ambulatory referral to Nutrition and Diabetic Education    Complete by:  As directed   New onset- A1C 6.5%     Diet - low sodium heart healthy    Complete by:  As directed      Increase activity slowly    Complete by:  As directed             Medication List    STOP taking these medications        lisinopril-hydrochlorothiazide 20-12.5 MG per tablet  Commonly known as:  PRINZIDE,ZESTORETIC     Phentermine-Topiramate 7.5-46 MG Cp24  Commonly known as:  QSYMIA      TAKE these medications        ALPRAZolam 0.5 MG tablet  Commonly known as:  XANAX  TAKE 1 TABLET BY MOUTH THREE TIMES DAILY AS NEEDED FOR ANXIETY     aspirin 81 MG EC tablet  Take 1 tablet (81 mg total) by mouth daily.     ciprofloxacin 500 MG tablet  Commonly known as:  CIPRO  Take 1 tablet (500 mg total) by mouth 2 (two) times  daily.     cyclobenzaprine 10 MG tablet  Commonly known as:  FLEXERIL  TAKE 1 TABLET BY MOUTH THREE TIMES DAILY AS NEEDED FOR MUSCLE SPASMS     diltiazem 120 MG 24 hr capsule  Commonly known as:  CARDIZEM CD  Take 1 capsule (120 mg total) by mouth daily.     Fluticasone Furoate-Vilanterol 100-25 MCG/INH Aepb  Commonly known as:  BREO ELLIPTA  Inhale 1 puff into the lungs 2 (two) times daily.     furosemide 20 MG tablet  Commonly known as:  LASIX  Take 1 tablet (20 mg total) by mouth 2 (two) times daily.     gabapentin 600 MG tablet  Commonly known as:  NEURONTIN  TAKE 1 TABLET BY MOUTH TWICE DAILY     ipratropium-albuterol 0.5-2.5 (3) MG/3ML Soln  Commonly known as:  DUONEB  Take 3 mLs by nebulization 2 (two) times daily.     multivitamin tablet  Take 1 tablet by mouth daily.     Oxycodone HCl 10 MG Tabs  Take 1-2 tablets (10-20 mg total)  by mouth every 4 (four) hours as needed.     PROVENTIL HFA 108 (90 BASE) MCG/ACT inhaler  Generic drug:  albuterol  INHALE 2 PUFFS INTO THE LUNGS EVERY 6 HOURS AS NEEDED FOR WHEEZING OR SHORTNESS OF BREATH     albuterol (2.5 MG/3ML) 0.083% nebulizer solution  Commonly known as:  PROVENTIL  Take 3 mLs (2.5 mg total) by nebulization every 3 (three) hours as needed for wheezing.       Allergies  Allergen Reactions  . Lyrica [Pregabalin] Other (See Comments)    Hallucinations        Follow-up Information    Follow up with HANDY,MICHAEL H, MD. Schedule an appointment as soon as possible for a visit in 10 days.   Specialty:  Orthopedic Surgery   Why:  For suture removal, For wound re-check   Contact information:   [place] [address] [phone]       Follow up with Wyatt Haste, MD In 2 weeks.   Specialty:  Family Medicine   Contact information:   [place] [address] [phone]       Follow up with [facility].   Why:  Home Health  Physical Therapy   Contact information:   [address] [place] [address] [phone]        The results of significant diagnostics from this hospitalization (including imaging, microbiology, ancillary and laboratory) are listed below for reference.    Significant Diagnostic Studies: Dg Chest 2 View  02/07/2014   CLINICAL DATA:  Shortness of breath.  Weakness.  Falls.  EXAM: CHEST  2 VIEW  COMPARISON:  01/28/2014  FINDINGS: Moderate enlargement of the cardiopericardial silhouette. Indistinct pulmonary vasculature. No pleural effusion. Posterolateral rod and pedicle screw fixation in the lumbar spine.  IMPRESSION: 1. Cardiomegaly and pulmonary venous hypertension.  No overt edema.   Electronically Signed   By: Sherryl Barters M.D.   On: 02/07/2014 18:04   Dg Chest 2 View  01/28/2014   CLINICAL DATA:  Cough, recent treatment for bronchitis, shortness of breath  EXAM: CHEST  2 VIEW  COMPARISON:  06/11/2013  FINDINGS: Borderline cardiomegaly. Central mild bronchitic changes. Mild interstitial prominence bilaterally. Mild interstitial edema or pneumonitis cannot be excluded. No segmental infiltrate or pulmonary edema.  IMPRESSION: Central mild bronchitic changes. Mild interstitial prominence bilaterally. Mild interstitial edema or pneumonitis cannot be excluded. No segmental infiltrate or pulmonary edema.   Electronically Signed   By: Lahoma Crocker M.D.   On: 01/28/2014 10:40   Ct Head Wo Contrast  02/07/2014   CLINICAL DATA:  Two day history of dizziness, blurred vision and lethargy. Multiple recent falls without head injury.  EXAM: CT HEAD WITHOUT CONTRAST  TECHNIQUE: Contiguous axial images were obtained from the base of the skull through the vertex without intravenous contrast.  COMPARISON:  05/25/2012.  FINDINGS: Ventricular system normal in size and appearance for age. No mass lesion. No midline shift. No acute hemorrhage or hematoma. No extra-axial fluid collections. No evidence of  acute infarction. No focal brain parenchymal abnormalities.  No focal osseous abnormalities involving the skull. Visualized paranasal sinuses, bilateral mastoid air cells, and bilateral middle ear cavities well-aerated.  IMPRESSION: Normal examination.   Electronically Signed   By: Evangeline Dakin M.D.   On: 02/07/2014 17:52   US Abdomen Port  02/08/2014   CLINICAL DATA:  Transaminitis.  Acute renal failure.  EXAM: ULTRASOUND PORTABLE ABDOMEN  COMPARISON:  CT scan  abdomen dated 06/17/2013  FINDINGS: Gallbladder: No gallstones or wall thickening visualized. No sonographic Murphy sign noted.  Common bile duct: Diameter: 6.2 mm, normal.  Liver: No focal lesion identified. Within normal limits in parenchymal echogenicity.  IVC: No abnormality visualized.  Pancreas: Visualized portion unremarkable.  Spleen: Normal.  10.5 cm in length.  Right Kidney: Length: 13.9 cm. Echogenicity within normal limits. No mass or hydronephrosis visualized.  Left Kidney: Length: 12.3 cm. 7 mm stone in the mid left kidney. Echogenicity within normal limits. No mass or hydronephrosis visualized.  Abdominal aorta: 2.4 cm maximal diameter.  Other findings: None.  IMPRESSION: No acute abnormalities. 7 mm stone in the mid left kidney. No hydronephrosis.   Electronically Signed   By: Rozetta Nunnery M.D.   On: 02/08/2014 09:39   Dg Chest Port 1 View  02/10/2014   CLINICAL DATA:  Acute respiratory failure  EXAM: PORTABLE CHEST - 1 VIEW  COMPARISON:  02/09/2014  FINDINGS: Endotracheal tube is again noted 4 cm above the carina. A nasogastric catheter courses into the stomach. A left jugular central line is again noted in the left innominate vein. Cardiac shadow remains enlarged. Vascular congestion parenchymal edema is again identified bilaterally. Given some technical variations in the film the overall appearance is stable. No new focal infiltrates are seen.  IMPRESSION: Stable pulmonary edema.  No new focal abnormality is noted.   Electronically  Signed   By: Inez Catalina M.D.   On: 02/10/2014 07:28   Dg Chest Port 1 View  02/09/2014   CLINICAL DATA:  Ventilator dependent respiratory failure. Follow-up pulmonary edema.  EXAM: PORTABLE CHEST - 1 VIEW  COMPARISON:  Portable chest x-rays yesterday. Two-view chest x-ray 02/07/2014, 01/28/2014, 07/14/2012.  FINDINGS: Endotracheal tube tip in satisfactory position projecting approximately 6 cm above the carina. Left jugular central venous catheter tip projects over the left innominate vein. Nasogastric tube courses below the diaphragm into the stomach though its tip is not visible. Cardiac silhouette moderately enlarged but stable. Interstitial pulmonary edema improved since the examination is yesterday, though pulmonary venous hypertension and minimal edema persists. Interval dense consolidation in the left lower lobe. No new pulmonary parenchymal abnormalities elsewhere.  IMPRESSION: Support apparatus satisfactory. Improved pulmonary edema, though minimal edema persists. New dense left lower lobe atelectasis.   Electronically Signed   By: Evangeline Dakin M.D.   On: 02/09/2014 11:07   Portable Chest Xray  02/08/2014   CLINICAL DATA:  Intubation.  Decreased blood pressure.  EXAM: PORTABLE CHEST - 1 VIEW  COMPARISON:  02/08/2014  FINDINGS: Endotracheal tube tip measures 5.4 cm above the carinal. Enteric tube tip is below the left hemidiaphragm off the field of view. Cardiac enlargement. Left central venous catheter with tip over the central mediastinum, likely in the brachiocephalic vein. Pulmonary vascular congestion. No edema or consolidation. No blunting of costophrenic angles. No pneumothorax.  IMPRESSION: Right central venous catheter tip overlies the brachiocephalic vein. Endotracheal tube tip measures 5.4 cm above the carina. Persistent cardiac enlargement and pulmonary vascular congestion.   Electronically Signed   By: Lucienne Capers M.D.   On: 02/08/2014 05:10   Dg Chest Port 1 View  02/08/2014    CLINICAL DATA:  Elevated troponin I level. Unresponsive. Initial encounter.  EXAM: PORTABLE CHEST - 1 VIEW  COMPARISON:  Chest radiograph from 02/07/2014  FINDINGS: The lungs are mildly hypoexpanded. Vascular congestion is noted, with increased interstitial markings, raising concern for mild pulmonary edema. No definite pleural effusion or pneumothorax is seen.  The cardiomediastinal silhouette is enlarged. No acute osseous abnormalities are identified.  IMPRESSION: Lungs mildly hypoexpanded. Vascular congestion and cardiomegaly, with increased interstitial markings, raising concern for mild new pulmonary edema.   Electronically Signed   By: Garald Balding M.D.   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  EXAM: RIGHT KNEE - COMPLETE 4+ VIEW  COMPARISON:  05/26/2012.  FINDINGS: Gas within the soft tissues of the anterolateral aspect of the stump, extending deep to the level of the tibia. Mild irregularity involving the distal tibia. Osseous demineralization.  IMPRESSION: Gas within the soft tissues of the anterolateral aspect of the stump, extending deep to the level of the tibia where there may be early changes of osteomyelitis.   Electronically Signed   By: Evangeline Dakin M.D.   On: 02/07/2014 18:06   Dg Abd Portable 1v  02/09/2014   CLINICAL DATA:  Nasogastric tube placement.  EXAM: PORTABLE ABDOMEN - 1 VIEW  COMPARISON:  02/08/2013.  FINDINGS: Exam is technically degraded by underpenetration (portable technique and obese body habitus). There is an enteric tube that follows the contour the stomach and the tip is in the duodenum at the junction of the second and third parts of the duodenum. Spinal fixation hardware noted.  IMPRESSION: Enteric tube tip at the junction of the second and third parts of the duodenum, advanced compared to prior.   Electronically Signed   By: Dereck Ligas M.D.   On: 02/09/2014  10:15   Dg Abd Portable 1v  02/08/2014   CLINICAL DATA:  Gastric tube placement.  EXAM: PORTABLE ABDOMEN - 1 VIEW  COMPARISON:  None.  FINDINGS: AP portable 5 and low upper abdominal radiographs demonstrate nasogastric tube with its tip along the greater curvature of the stomach. The heart is enlarged. The patient is intubated. BILATERAL pleural effusions and basilar atelectasis are suspected.  IMPRESSION: Nasogastric tube tip lies in the stomach along the greater curvature.   Electronically Signed   By: Rolla Flatten M.D.   On: 02/08/2014 08:03   Ct Extrem Lower Wo Cm Bil  02/08/2014   CLINICAL DATA:  Draining wound from a right BKA stump. Diffuse cellulitis and both lower extremities.  EXAM: CT OF THE LOWER BILATERAL EXTREMITY WITHOUT CONTRAST  TECHNIQUE: Multidetector CT imaging of the bilateral lower extremities was performed according to the standard protocol.  COMPARISON:  Radiographs 02/07/2014  FINDINGS: There is diffuse subcutaneous soft tissue swelling/ edema and skin thickening involving both lower extremities suggesting cellulitis. No definite findings for myofasciitis. There is an intra medullary rod in the right femur with reactive changes but no definite findings for osteomyelitis. There is an open wound at the right BKA stump with fluid and air noted in the subcutaneous fat surrounding the bony structures. This is consistent with an abscess. I do not see any definite destructive bony changes but the air and fluid both right to the bone and osteomyelitis is likely.  The left lower extremity does not demonstrate any definite changes of septic arthritis or osteomyelitis.  IMPRESSION: Bilateral lower extremity cellulitis.  No findings for myofasciitis.  Fluid and air surrounding the right BKA stump consistent with an abscess. No definite destructive bony changes but osteomyelitis is likely.   Electronically Signed   By: Kalman Jewels M.D.   On: 02/08/2014 12:54    Microbiology: Recent Results  (from the past 240 hour(s))  Blood culture (routine x 2)     Status: None   Collection Time: 02/07/14  6:14 PM  Result Value Ref Range Status   Specimen Description BLOOD RIGHT HAND  Final   Special Requests BOTTLES DRAWN AEROBIC AND ANAEROBIC 3 CC EACH  Final   Culture   Final    NO GROWTH 5 DAYS Performed at [facility]    Report Status 02/14/2014 FINAL  Final  Blood culture (routine x 2)     Status: None   Collection Time: 02/07/14  6:15 PM  Result Value Ref Range Status   Specimen Description BLOOD RIGHT ANTECUBITAL  Final   Special Requests BOTTLES DRAWN AEROBIC ONLY 3 CC   Final   Culture   Final    NO GROWTH 5 DAYS Note: Culture results may be compromised due to an inadequate volume of blood received in culture bottles. Performed at [facility]    Report Status 02/14/2014 FINAL  Final  Wound culture     Status: None   Collection Time: 02/07/14  6:52 PM  Result Value Ref Range Status   Specimen Description STUMP  Final   Special Requests NONE  Final   Gram Stain   Final    FEW WBC PRESENT, PREDOMINANTLY PMN NO SQUAMOUS EPITHELIAL CELLS SEEN NO ORGANISMS SEEN Performed at [facility]    Culture   Final    MULTIPLE ORGANISMS PRESENT, NONE PREDOMINANT Note: NO STAPHYLOCOCCUS AUREUS ISOLATED NO GROUP A STREP (S.PYOGENES) ISOLATED Performed at [facility]    Report Status 02/10/2014 FINAL  Final  Urine culture     Status: None   Collection Time: 02/07/14  8:44 PM  Result Value Ref Range Status   Specimen Description URINE, RANDOM  Final   Special Requests NONE  Final   Colony Count   Final    >=100,000 COLONIES/ML Performed at [facility]    Culture   Final    ESCHERICHIA COLI Performed at [facility]    Report Status 02/10/2014 FINAL  Final   Organism ID, Bacteria ESCHERICHIA COLI  Final      Susceptibility   Escherichia coli - MIC*    AMPICILLIN 4 SENSITIVE Sensitive     CEFAZOLIN <=4 SENSITIVE Sensitive      CEFTRIAXONE <=1 SENSITIVE Sensitive     CIPROFLOXACIN <=0.25 SENSITIVE Sensitive     GENTAMICIN <=1 SENSITIVE Sensitive     LEVOFLOXACIN <=0.12 SENSITIVE Sensitive     NITROFURANTOIN <=16 SENSITIVE Sensitive     TOBRAMYCIN <=1 SENSITIVE Sensitive     TRIMETH/SULFA <=20 SENSITIVE Sensitive     PIP/TAZO <=4 SENSITIVE Sensitive     * ESCHERICHIA COLI  MRSA PCR Screening     Status: None   Collection Time: 02/08/14  2:43 AM  Result Value Ref Range Status   MRSA by PCR NEGATIVE NEGATIVE Final    Comment:        The GeneXpert MRSA Assay (FDA approved for NASAL specimens only), is one component of a comprehensive MRSA colonization surveillance program. It is not intended to diagnose MRSA infection nor to guide or monitor treatment for MRSA infections. Performed at [facility]   Gram stain     Status: None   Collection Time: 02/09/14 12:44 PM  Result Value Ref Range Status   Specimen Description ABSCESS KNEE RIGHT  Final   Special Requests PT ON ZOSYN  Final   Gram Stain RARE WBC SEEN NO ORGANISMS SEEN   Final  Report Status 02/09/2014 FINAL  Final  Culture, routine-abscess     Status: None   Collection Time: 02/09/14 12:44 PM  Result Value Ref Range Status   Specimen Description ABSCESS KNEE RIGHT  Final   Special Requests PT ON ZOSYN  Final   Gram Stain   Final    RARE WBC NO ORGANISMS SEEN Performed at [facility] Performed at [facility]    Culture   Final    NO GROWTH 3 DAYS Performed at [facility]    Report Status 02/13/2014 FINAL  Final  Anaerobic culture     Status: None   Collection Time: 02/09/14 12:44 PM  Result Value Ref Range Status   Specimen Description ABSCESS KNEE RIGHT  Final   Special Requests PT ON ZOSYN  Final   Gram Stain   Final    NO WBC SEEN NO SQUAMOUS EPITHELIAL CELLS SEEN NO ORGANISMS SEEN Performed at [facility]    Culture   Final    NO ANAEROBES ISOLATED Performed at [facility]    Report Status 02/14/2014 FINAL  Final  Tissue culture     Status: None   Collection Time: 02/09/14 12:46 PM  Result Value Ref Range Status   Specimen Description TISSUE KNEE RIGHT  Final   Special Requests BELOW KNEE AMPUTATION RIGHT   Final   Gram Stain   Final    RARE WBC PRESENT, PREDOMINANTLY PMN NO ORGANISMS SEEN Performed at [facility]    Culture   Final    NO GROWTH 3 DAYS Performed at [facility]    Report Status 02/12/2014 FINAL  Final  Anaerobic culture     Status: None   Collection Time: 02/09/14 12:46 PM  Result Value Ref Range Status   Specimen Description TISSUE KNEE RIGHT  Final   Special Requests BELOW KNEE AMPUTATION RIGHT  Final   Gram Stain   Final    RARE WBC PRESENT, PREDOMINANTLY PMN NO ORGANISMS SEEN Performed at [facility]    Culture   Final    NO ANAEROBES ISOLATED Performed at [facility]    Report Status 02/14/2014 FINAL  Final     Labs: Basic Metabolic Panel:  Recent Labs Lab 02/09/14 0445  02/10/14 1730 02/11/14 0400 02/11/14 1725 02/12/14 0819 02/13/14 0510 02/14/14 0539 02/15/14 0448  NA 137  < > 142 140 138 139 140 140 141  K 4.2  < > 4.3 3.4* 4.2 4.1 3.7 3.6 3.5  CL 93*  < > 94* 91* 90* 93* 98 103 105  CO2 29  < > 37* 39* 37* 35* 35* 30 29  GLUCOSE 125*  < > 93 103* 132* 103* 105* 97 99  BUN 58*  < > 62* 54* 45* 40* 27* 24* 22  CREATININE 6.13*  < > 3.80* 3.09* 2.56* 2.01* 1.62* 1.48* 1.55*  CALCIUM 7.7*  < > 8.5 8.3* 8.5 8.7 8.8 9.0 8.8  MG  --   --  2.0 1.9 2.1 1.9  --   --   --   PHOS 8.7*  --  6.4* 6.2* 4.4 3.5  --   --   --   < > = values in this interval not displayed. Liver Function Tests:  Recent Labs Lab 02/09/14 0445 02/10/14 0428 02/15/14 0448  AST 111* 63* 55*  ALT 277* 184* 86*  ALKPHOS 65 56 52  BILITOT 0.8 1.3* 0.4  PROT 6.5 6.5 6.4  ALBUMIN  2.8* 2.7* 2.8*   No results for input(s): LIPASE, AMYLASE in the last 168 hours. No results for input(s):  AMMONIA in the last 168 hours. CBC:  Recent Labs Lab 02/10/14 0428 02/11/14 0400 02/12/14 0600 02/13/14 0510 02/15/14 0448  WBC 12.2* 12.1* 9.0 7.8 8.4  NEUTROABS 10.5* 7.9*  --   --   --   HGB 10.3* 9.8* 10.2* 10.0* 9.6*  HCT 34.2* 34.0* 36.3* 36.6* 34.8*  MCV 72.3* 75.2* 75.9* 75.6* 74.7*  PLT 245 232 295 276 313   Cardiac Enzymes:  Recent Labs Lab 02/08/14 2048 02/09/14 1830 02/09/14 2228 02/10/14 0428  CKTOTAL 114  --   --   --   TROPONINI  --  1.42* 1.36* 1.34*   BNP: BNP (last 3 results) No results for input(s): PROBNP in the last 8760 hours. CBG:  Recent Labs Lab 02/14/14 2013 02/15/14 0010 02/15/14 0432 02/15/14 0813 02/15/14 1214  GLUCAP 107* 104* 103* 103* 97       Signed:  Magaline Steinberg K  [facility] 02/15/2014, 7:54 PM

## 2014-02-15 NOTE — Progress Notes (Signed)
Patient discharge completed at this time, all education along with discharge instructions given to patient. Patient has no further questions at this time.

## 2014-02-17 NOTE — Telephone Encounter (Signed)
Called pharmacy and they state Rx Qsymia went thru 02/04/14 for the 3.75 & 7.5 went thru insurance

## 2014-02-18 ENCOUNTER — Telehealth: Payer: Self-pay | Admitting: Family Medicine

## 2014-02-18 NOTE — Telephone Encounter (Signed)
Darnell with [facility] went out last night for physical therapy with our patient.  The patient declined the therapy stating he was getting around well with his wheelchair and his walker.  Mariella Saa can be reached at [phone]

## 2014-02-25 ENCOUNTER — Inpatient Hospital Stay: Payer: 59 | Admitting: Family Medicine

## 2014-02-25 ENCOUNTER — Encounter: Payer: Self-pay | Admitting: Family Medicine

## 2014-02-25 ENCOUNTER — Ambulatory Visit (INDEPENDENT_AMBULATORY_CARE_PROVIDER_SITE_OTHER): Payer: 59 | Admitting: Family Medicine

## 2014-02-25 VITALS — BP 120/80 | HR 102

## 2014-02-25 DIAGNOSIS — E119 Type 2 diabetes mellitus without complications: Secondary | ICD-10-CM | POA: Diagnosis not present

## 2014-02-25 DIAGNOSIS — Z8709 Personal history of other diseases of the respiratory system: Secondary | ICD-10-CM | POA: Diagnosis not present

## 2014-02-25 DIAGNOSIS — Z87448 Personal history of other diseases of urinary system: Secondary | ICD-10-CM

## 2014-02-25 DIAGNOSIS — Z8619 Personal history of other infectious and parasitic diseases: Secondary | ICD-10-CM

## 2014-02-25 DIAGNOSIS — Z79899 Other long term (current) drug therapy: Secondary | ICD-10-CM

## 2014-02-25 LAB — COMPREHENSIVE METABOLIC PANEL
ALBUMIN: 3.5 g/dL (ref 3.5–5.2)
ALK PHOS: 62 U/L (ref 39–117)
ALT: 24 U/L (ref 0–53)
AST: 18 U/L (ref 0–37)
BUN: 18 mg/dL (ref 6–23)
CHLORIDE: 102 meq/L (ref 96–112)
CO2: 34 mEq/L — ABNORMAL HIGH (ref 19–32)
CREATININE: 1.1 mg/dL (ref 0.50–1.35)
Calcium: 9.4 mg/dL (ref 8.4–10.5)
Glucose, Bld: 168 mg/dL — ABNORMAL HIGH (ref 70–99)
Potassium: 4 mEq/L (ref 3.5–5.3)
SODIUM: 144 meq/L (ref 135–145)
Total Bilirubin: 0.3 mg/dL (ref 0.2–1.2)
Total Protein: 7.5 g/dL (ref 6.0–8.3)

## 2014-02-25 LAB — POCT URINALYSIS DIPSTICK
Bilirubin, UA: NEGATIVE
Blood, UA: NEGATIVE
Glucose, UA: NEGATIVE
Ketones, UA: NEGATIVE
LEUKOCYTES UA: NEGATIVE
Nitrite, UA: NEGATIVE
PH UA: 6
Protein, UA: NEGATIVE
Spec Grav, UA: 1.015
Urobilinogen, UA: NEGATIVE

## 2014-02-25 LAB — CBC WITH DIFFERENTIAL/PLATELET
BASOS ABS: 0 10*3/uL (ref 0.0–0.1)
Basophils Relative: 0 % (ref 0–1)
Eosinophils Absolute: 0.2 10*3/uL (ref 0.0–0.7)
Eosinophils Relative: 2 % (ref 0–5)
HEMATOCRIT: 38.4 % — AB (ref 39.0–52.0)
Hemoglobin: 11.3 g/dL — ABNORMAL LOW (ref 13.0–17.0)
LYMPHS PCT: 15 % (ref 12–46)
Lymphs Abs: 1.3 10*3/uL (ref 0.7–4.0)
MCH: 21.5 pg — AB (ref 26.0–34.0)
MCHC: 29.4 g/dL — ABNORMAL LOW (ref 30.0–36.0)
MCV: 73 fL — AB (ref 78.0–100.0)
MONOS PCT: 5 % (ref 3–12)
MPV: 9.2 fL (ref 8.6–12.4)
Monocytes Absolute: 0.4 10*3/uL (ref 0.1–1.0)
NEUTROS ABS: 6.6 10*3/uL (ref 1.7–7.7)
Neutrophils Relative %: 78 % — ABNORMAL HIGH (ref 43–77)
PLATELETS: 387 10*3/uL (ref 150–400)
RBC: 5.26 MIL/uL (ref 4.22–5.81)
RDW: 18.5 % — ABNORMAL HIGH (ref 11.5–15.5)
WBC: 8.5 10*3/uL (ref 4.0–10.5)

## 2014-02-26 ENCOUNTER — Encounter: Payer: Self-pay | Admitting: Family Medicine

## 2014-02-26 NOTE — Progress Notes (Signed)
   Subjective:    Patient ID: Dean Bowers, male    DOB: February 07, 1970, 44 y.o.   MRN: [id number]  HPI he is here for posthospitalization follow-up. He was hospitalized and treated for acute renal failure. He was found to be diabetic with hemoglobin A1c of 6.5. The hospital he also had difficulty with respiratory failure and gram-negative sepsis. His urine culture did grow out Escherichia coli. He also had difficulty with an infection in his stump on the right. His medications were readjusted. Is now been out of the hospital for approximately 10 days. He states his urine output has been good. He finished the antibiotic one week ago. He is not using any of his pulmonary medications on a regular basis. He has used a nebulizer twice this week. He was seen recently by orthopedics and the sutures were kept in place in his stump due to slight increase in his edema. He notes some swelling in the stump and in his lower extremity. Otherwise he seems to be doing well.  Review of Systems     Objective:   Physical Exam alert and in no distress. Cardiac exam shows regular rhythm without murmurs or gallops. Lungs are clear to auscultation. Exam of the stump does show a dressing in place. The area does not feel warm or tender.      Assessment & Plan:  History of acute renal failure - Plan: POCT Urinalysis Dipstick, CBC with Differential, Comprehensive metabolic panel  New onset type 2 diabetes mellitus - Plan: POCT Urinalysis Dipstick, CBC with Differential, Comprehensive metabolic panel  History of acute respiratory failure - Plan: POCT Urinalysis Dipstick, CBC with Differential, Comprehensive metabolic panel  History of gram negative sepsis - Plan: POCT Urinalysis Dipstick, CBC with Differential, Comprehensive metabolic panel  Encounter for long-term (current) use of medications - Plan: POCT Urinalysis Dipstick, CBC with Differential, Comprehensive metabolic panel  he seems to be doing fairly well after  his discharge although the increase in edema is bothersome. Further intervention and evaluation pending at work.

## 2014-03-01 ENCOUNTER — Telehealth: Payer: Self-pay | Admitting: Family Medicine

## 2014-03-01 ENCOUNTER — Other Ambulatory Visit: Payer: Self-pay

## 2014-03-01 DIAGNOSIS — E119 Type 2 diabetes mellitus without complications: Secondary | ICD-10-CM

## 2014-03-01 MED ORDER — FUROSEMIDE 20 MG PO TABS: 20.0000 mg | ORAL_TABLET | Freq: Two times a day (BID) | ORAL | Status: DC

## 2014-03-01 NOTE — Telephone Encounter (Signed)
Pt called and stated that he needed a refill on Lasix. Pt states he was given this by an urgent care but she states you wanted hum to stay on it. Pt uses [facility] in [place] and can be reached at [phone].

## 2014-03-01 NOTE — Telephone Encounter (Signed)
He states that he is holding his own in terms of the swelling. He does have a follow-up appoint with orthopedics in a few days and also is scheduled to see cardiology on February 9.

## 2014-03-02 ENCOUNTER — Encounter: Payer: Self-pay | Admitting: Family Medicine

## 2014-03-02 ENCOUNTER — Ambulatory Visit: Payer: 59 | Admitting: Family

## 2014-03-04 ENCOUNTER — Other Ambulatory Visit: Payer: Self-pay | Admitting: Orthopedic Surgery

## 2014-03-04 DIAGNOSIS — M86451 Chronic osteomyelitis with draining sinus, right femur: Secondary | ICD-10-CM

## 2014-03-08 ENCOUNTER — Ambulatory Visit (INDEPENDENT_AMBULATORY_CARE_PROVIDER_SITE_OTHER): Payer: 59 | Admitting: Cardiovascular Disease

## 2014-03-08 ENCOUNTER — Encounter: Payer: Self-pay | Admitting: Cardiovascular Disease

## 2014-03-08 VITALS — BP 114/68 | HR 84 | Ht 69.0 in

## 2014-03-08 DIAGNOSIS — R600 Localized edema: Secondary | ICD-10-CM | POA: Insufficient documentation

## 2014-03-08 DIAGNOSIS — I10 Essential (primary) hypertension: Secondary | ICD-10-CM | POA: Insufficient documentation

## 2014-03-08 NOTE — Patient Instructions (Signed)
Please follow up with Dr. Gwenlyn Found as needed.
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
## 2014-03-08 NOTE — Assessment & Plan Note (Signed)
History of hypertension with blood pressure measured today at 114/68. He is on diltiazem 120 mg a day. Continued current meds at current dosing

## 2014-03-10 ENCOUNTER — Ambulatory Visit (INDEPENDENT_AMBULATORY_CARE_PROVIDER_SITE_OTHER): Payer: 59 | Admitting: Family Medicine

## 2014-03-10 ENCOUNTER — Encounter: Payer: Self-pay | Admitting: Family Medicine

## 2014-03-10 VITALS — BP 122/70

## 2014-03-10 DIAGNOSIS — E291 Testicular hypofunction: Secondary | ICD-10-CM

## 2014-03-10 DIAGNOSIS — F17200 Nicotine dependence, unspecified, uncomplicated: Secondary | ICD-10-CM

## 2014-03-10 DIAGNOSIS — R7989 Other specified abnormal findings of blood chemistry: Secondary | ICD-10-CM

## 2014-03-10 DIAGNOSIS — L0291 Cutaneous abscess, unspecified: Secondary | ICD-10-CM

## 2014-03-10 DIAGNOSIS — Z720 Tobacco use: Secondary | ICD-10-CM

## 2014-03-10 DIAGNOSIS — E119 Type 2 diabetes mellitus without complications: Secondary | ICD-10-CM

## 2014-03-10 LAB — POCT URINALYSIS DIPSTICK
BILIRUBIN UA: NEGATIVE
GLUCOSE UA: NEGATIVE
Ketones, UA: NEGATIVE
LEUKOCYTES UA: NEGATIVE
Nitrite, UA: NEGATIVE
PH UA: 6
Protein, UA: 0.15
RBC UA: NEGATIVE
SPEC GRAV UA: 1.025
Urobilinogen, UA: NEGATIVE

## 2014-03-10 NOTE — Addendum Note (Signed)
Addended by: Randel Books on: 03/10/2014 04:38 PM   Modules accepted: Orders

## 2014-03-10 NOTE — Progress Notes (Signed)
   Subjective:    Patient ID: Dean Bowers, male    DOB: 27-Jul-1970, 44 y.o.   MRN: [id number]  HPI He is here for a recheck. He is recently seen by cardiology and no cardiac issues were identified. He continues to be followed by orthopedics for further evaluation of possible infection in his stump. He is having differing around in his house due to not being a will to wear his prosthesis. The wheelchair is very unruly in the house and he would like a knee walker. He has noted an improvement in his edema. He is also been checking his blood sugars and they are running in the low 100s. He was seen by urology in the past and apparently did have a low testosterone. He will need follow-up testosterone level. He continues have difficulty with sleep issues and would like medication. He is scheduled for sleep study March 18.   Review of Systems     Objective:   Physical Exam Alert and in no distress otherwise not examined       Assessment & Plan:  Diabetes mellitus without complication  Obesity, Class III, BMI 40-49.9 (morbid obesity)  Smoker  Low testosterone - Plan: Testosterone  Abscess  I encouraged him to go to the diabetes nutrition meeting. He has been reluctant to do this. Encouraged him to continue checking his blood sugars. I will write for him to get a knee walker and see if insurance will cover. We will check a testosterone level in the morning in the near future. Explained that I could not give him a sleep meds since he probably does have sleep apnea and this could make it much worse. He will continue to be followed by orthopedics.

## 2014-03-14 ENCOUNTER — Other Ambulatory Visit: Payer: Self-pay | Admitting: Medical

## 2014-03-15 NOTE — Telephone Encounter (Signed)
Is this okay to refill? 

## 2014-03-16 ENCOUNTER — Ambulatory Visit: Payer: 59 | Admitting: Cardiovascular Disease

## 2014-03-17 ENCOUNTER — Other Ambulatory Visit: Payer: Self-pay | Admitting: Family Medicine

## 2014-03-17 ENCOUNTER — Other Ambulatory Visit: Payer: Self-pay

## 2014-03-17 NOTE — Telephone Encounter (Signed)
Okay to renew

## 2014-03-17 NOTE — Telephone Encounter (Signed)
Is this okay?

## 2014-03-18 ENCOUNTER — Ambulatory Visit
Admission: RE | Admit: 2014-03-18 | Discharge: 2014-03-18 | Disposition: A | Discharge status: Home / Self Care | Payer: 59 | Source: Ambulatory Visit | Attending: Orthopedic Surgery | Admitting: Orthopedic Surgery

## 2014-03-18 DIAGNOSIS — M86451 Chronic osteomyelitis with draining sinus, right femur: Secondary | ICD-10-CM

## 2014-03-18 MED ORDER — GADOBENATE DIMEGLUMINE 529 MG/ML IV SOLN
20.0000 mL | Freq: Once | INTRAVENOUS | Status: AC | PRN
  Administered 2014-03-18: 20 mL via INTRAVENOUS

## 2014-03-19 ENCOUNTER — Other Ambulatory Visit: Payer: 59

## 2014-03-23 ENCOUNTER — Other Ambulatory Visit (HOSPITAL_COMMUNITY): Payer: Self-pay | Admitting: Orthopedic Surgery

## 2014-03-25 ENCOUNTER — Encounter (HOSPITAL_COMMUNITY): Payer: Self-pay | Admitting: *Deleted

## 2014-03-25 MED ORDER — DEXTROSE 5 % IV SOLN
3.0000 g | INTRAVENOUS | Status: AC
  Administered 2014-03-26: 3 g via INTRAVENOUS
  Filled 2014-03-25: qty 3000

## 2014-03-26 ENCOUNTER — Inpatient Hospital Stay (HOSPITAL_COMMUNITY)
Admission: RE | Admit: 2014-03-26 | Discharge: 2014-03-30 | DRG: 981 | Disposition: A | Discharge status: Home / Self Care | Payer: 59 | Source: Ambulatory Visit | Attending: Pulmonary Disease | Admitting: Pulmonary Disease

## 2014-03-26 ENCOUNTER — Encounter (HOSPITAL_COMMUNITY): Payer: Self-pay | Admitting: *Deleted

## 2014-03-26 ENCOUNTER — Ambulatory Visit (HOSPITAL_COMMUNITY): Payer: 59 | Admitting: Certified Registered"

## 2014-03-26 ENCOUNTER — Inpatient Hospital Stay (HOSPITAL_COMMUNITY): Payer: 59

## 2014-03-26 ENCOUNTER — Encounter (HOSPITAL_COMMUNITY)
Admission: RE | Disposition: A | Discharge status: Home / Self Care | Payer: Self-pay | Source: Ambulatory Visit | Attending: Internal Medicine

## 2014-03-26 DIAGNOSIS — E119 Type 2 diabetes mellitus without complications: Secondary | ICD-10-CM | POA: Diagnosis present

## 2014-03-26 DIAGNOSIS — Y835 Amputation of limb(s) as the cause of abnormal reaction of the patient, or of later complication, without mention of misadventure at the time of the procedure: Secondary | ICD-10-CM | POA: Diagnosis present

## 2014-03-26 DIAGNOSIS — E118 Type 2 diabetes mellitus with unspecified complications: Secondary | ICD-10-CM | POA: Diagnosis not present

## 2014-03-26 DIAGNOSIS — E876 Hypokalemia: Secondary | ICD-10-CM | POA: Diagnosis present

## 2014-03-26 DIAGNOSIS — T8743 Infection of amputation stump, right lower extremity: Secondary | ICD-10-CM | POA: Diagnosis present

## 2014-03-26 DIAGNOSIS — J9620 Acute and chronic respiratory failure, unspecified whether with hypoxia or hypercapnia: Secondary | ICD-10-CM | POA: Diagnosis not present

## 2014-03-26 DIAGNOSIS — G8929 Other chronic pain: Secondary | ICD-10-CM | POA: Diagnosis present

## 2014-03-26 DIAGNOSIS — I5031 Acute diastolic (congestive) heart failure: Secondary | ICD-10-CM | POA: Diagnosis present

## 2014-03-26 DIAGNOSIS — E662 Morbid (severe) obesity with alveolar hypoventilation: Secondary | ICD-10-CM | POA: Diagnosis present

## 2014-03-26 DIAGNOSIS — Z825 Family history of asthma and other chronic lower respiratory diseases: Secondary | ICD-10-CM

## 2014-03-26 DIAGNOSIS — F17210 Nicotine dependence, cigarettes, uncomplicated: Secondary | ICD-10-CM | POA: Diagnosis present

## 2014-03-26 DIAGNOSIS — I10 Essential (primary) hypertension: Secondary | ICD-10-CM

## 2014-03-26 DIAGNOSIS — F419 Anxiety disorder, unspecified: Secondary | ICD-10-CM | POA: Diagnosis present

## 2014-03-26 DIAGNOSIS — R0902 Hypoxemia: Secondary | ICD-10-CM | POA: Diagnosis present

## 2014-03-26 DIAGNOSIS — T8740 Infection of amputation stump, unspecified extremity: Secondary | ICD-10-CM | POA: Diagnosis present

## 2014-03-26 DIAGNOSIS — J9601 Acute respiratory failure with hypoxia: Secondary | ICD-10-CM | POA: Diagnosis not present

## 2014-03-26 DIAGNOSIS — I959 Hypotension, unspecified: Secondary | ICD-10-CM | POA: Diagnosis present

## 2014-03-26 DIAGNOSIS — T8130XA Disruption of wound, unspecified, initial encounter: Secondary | ICD-10-CM | POA: Diagnosis present

## 2014-03-26 DIAGNOSIS — Z6841 Body Mass Index (BMI) 40.0 and over, adult: Secondary | ICD-10-CM

## 2014-03-26 DIAGNOSIS — J449 Chronic obstructive pulmonary disease, unspecified: Secondary | ICD-10-CM | POA: Diagnosis present

## 2014-03-26 DIAGNOSIS — Z833 Family history of diabetes mellitus: Secondary | ICD-10-CM | POA: Diagnosis not present

## 2014-03-26 DIAGNOSIS — J9621 Acute and chronic respiratory failure with hypoxia: Secondary | ICD-10-CM | POA: Insufficient documentation

## 2014-03-26 DIAGNOSIS — Z9289 Personal history of other medical treatment: Secondary | ICD-10-CM

## 2014-03-26 DIAGNOSIS — D649 Anemia, unspecified: Secondary | ICD-10-CM | POA: Diagnosis present

## 2014-03-26 DIAGNOSIS — J95821 Acute postprocedural respiratory failure: Principal | ICD-10-CM

## 2014-03-26 DIAGNOSIS — T879 Unspecified complications of amputation stump: Secondary | ICD-10-CM | POA: Diagnosis present

## 2014-03-26 DIAGNOSIS — Z8249 Family history of ischemic heart disease and other diseases of the circulatory system: Secondary | ICD-10-CM

## 2014-03-26 DIAGNOSIS — Z978 Presence of other specified devices: Secondary | ICD-10-CM

## 2014-03-26 HISTORY — DX: Personal history of other diseases of urinary system: Z87.448

## 2014-03-26 HISTORY — DX: Anxiety disorder, unspecified: F41.9

## 2014-03-26 HISTORY — DX: Calculus of kidney: N20.0

## 2014-03-26 HISTORY — DX: Personal history of other medical treatment: Z92.89

## 2014-03-26 HISTORY — DX: Headache, unspecified: R51.9

## 2014-03-26 HISTORY — DX: Headache: R51

## 2014-03-26 HISTORY — PX: STUMP REVISION: SHX6102

## 2014-03-26 HISTORY — DX: Personal history of other diseases of the respiratory system: Z87.09

## 2014-03-26 HISTORY — DX: Chronic obstructive pulmonary disease, unspecified: J44.9

## 2014-03-26 HISTORY — DX: Peripheral vascular disease, unspecified: I73.9

## 2014-03-26 LAB — BLOOD GAS, ARTERIAL
Acid-Base Excess: 7.2 mmol/L — ABNORMAL HIGH (ref 0.0–2.0)
Bicarbonate: 35.2 mEq/L — ABNORMAL HIGH (ref 20.0–24.0)
DRAWN BY: 27022
Delivery systems: POSITIVE
Expiratory PAP: 8
FIO2: 0.6 %
INSPIRATORY PAP: 12
LHR: 12 {breaths}/min
Mode: POSITIVE
O2 Saturation: 78.8 %
PCO2 ART: 97.4 mmHg — AB (ref 35.0–45.0)
PO2 ART: 53.2 mmHg — AB (ref 80.0–100.0)
Patient temperature: 98.6
TCO2: 38.2 mmol/L (ref 0–100)
pH, Arterial: 7.184 — CL (ref 7.350–7.450)

## 2014-03-26 LAB — COMPREHENSIVE METABOLIC PANEL
ALK PHOS: 80 U/L (ref 39–117)
ALT: 21 U/L (ref 0–53)
AST: 32 U/L (ref 0–37)
Albumin: 3 g/dL — ABNORMAL LOW (ref 3.5–5.2)
Anion gap: 7 (ref 5–15)
BUN: 12 mg/dL (ref 6–23)
CO2: 30 mmol/L (ref 19–32)
CREATININE: 0.75 mg/dL (ref 0.50–1.35)
Calcium: 8.7 mg/dL (ref 8.4–10.5)
Chloride: 101 mmol/L (ref 96–112)
GFR calc Af Amer: >90 mL/min (ref >90)
GLUCOSE: 93 mg/dL (ref 70–99)
Potassium: 4.1 mmol/L (ref 3.5–5.1)
Sodium: 138 mmol/L (ref 135–145)
Total Bilirubin: 0.5 mg/dL (ref 0.3–1.2)
Total Protein: 7.6 g/dL (ref 6.0–8.3)

## 2014-03-26 LAB — POCT I-STAT 3, ART BLOOD GAS (G3+)
ACID-BASE EXCESS: 9 mmol/L — AB (ref 0.0–2.0)
Acid-Base Excess: 7 mmol/L — ABNORMAL HIGH (ref 0.0–2.0)
Acid-Base Excess: 8 mmol/L — ABNORMAL HIGH (ref 0.0–2.0)
BICARBONATE: 36.1 meq/L — AB (ref 20.0–24.0)
BICARBONATE: 36.7 meq/L — AB (ref 20.0–24.0)
Bicarbonate: 37.2 mEq/L — ABNORMAL HIGH (ref 20.0–24.0)
O2 SAT: 84 %
O2 SAT: 99 %
O2 Saturation: 97 %
PCO2 ART: 89.8 mmHg — AB (ref 35.0–45.0)
PCO2 ART: 63.5 mmHg — AB (ref 35.0–45.0)
PH ART: 7.225 — AB (ref 7.350–7.450)
PH ART: 7.383 (ref 7.350–7.450)
PO2 ART: 52 mmHg — AB (ref 80.0–100.0)
PO2 ART: 158 mmHg — AB (ref 80.0–100.0)
Patient temperature: 98.6
TCO2: 40 mmol/L (ref 0–100)
TCO2: 38 mmol/L (ref 0–100)
TCO2: 39 mmol/L (ref 0–100)
pCO2 arterial: 61.6 mmHg (ref 35.0–45.0)
pH, Arterial: 7.363 (ref 7.350–7.450)
pO2, Arterial: 109 mmHg — ABNORMAL HIGH (ref 80.0–100.0)

## 2014-03-26 LAB — CBC
HCT: 39.4 % (ref 39.0–52.0)
Hemoglobin: 11.1 g/dL — ABNORMAL LOW (ref 13.0–17.0)
MCH: 20.7 pg — ABNORMAL LOW (ref 26.0–34.0)
MCHC: 28.2 g/dL — AB (ref 30.0–36.0)
MCV: 73.5 fL — ABNORMAL LOW (ref 78.0–100.0)
PLATELETS: 258 10*3/uL (ref 150–400)
RBC: 5.36 MIL/uL (ref 4.22–5.81)
RDW: 20.3 % — ABNORMAL HIGH (ref 11.5–15.5)
WBC: 10.8 10*3/uL — ABNORMAL HIGH (ref 4.0–10.5)

## 2014-03-26 LAB — PROTIME-INR
INR: 1.16 (ref 0.00–1.49)
Prothrombin Time: 14.9 seconds (ref 11.6–15.2)

## 2014-03-26 LAB — GLUCOSE, CAPILLARY
Glucose-Capillary: 106 mg/dL — ABNORMAL HIGH (ref 70–99)
Glucose-Capillary: 123 mg/dL — ABNORMAL HIGH (ref 70–99)

## 2014-03-26 LAB — APTT: aPTT: 32 seconds (ref 24–37)

## 2014-03-26 SURGERY — REVISION, AMPUTATION SITE
Anesthesia: General | Site: Leg Lower | Laterality: Right

## 2014-03-26 MED ORDER — ASPIRIN EC 81 MG PO TBEC
81.0000 mg | DELAYED_RELEASE_TABLET | Freq: Every day | ORAL | Status: DC
  Administered 2014-03-29 – 2014-03-30 (×2): 81 mg via ORAL
  Filled 2014-03-26 (×5): qty 1

## 2014-03-26 MED ORDER — HYDROMORPHONE HCL 1 MG/ML IJ SOLN
0.5000 mg | INTRAMUSCULAR | Status: DC | PRN
  Administered 2014-03-26: 1 mg via INTRAVENOUS
  Administered 2014-03-28: 0.5 mg via INTRAVENOUS
  Administered 2014-03-28: 1 mg via INTRAVENOUS
  Administered 2014-03-29: 0.5 mg via INTRAVENOUS
  Filled 2014-03-26: qty 1

## 2014-03-26 MED ORDER — VANCOMYCIN HCL 500 MG IV SOLR
INTRAVENOUS | Status: DC | PRN
  Administered 2014-03-26: 500 mg

## 2014-03-26 MED ORDER — METOCLOPRAMIDE HCL 5 MG/ML IJ SOLN: 5.0000 mg | Freq: Three times a day (TID) | INTRAMUSCULAR | Status: DC | PRN

## 2014-03-26 MED ORDER — NALOXONE HCL 0.4 MG/ML IJ SOLN
INTRAMUSCULAR | Status: DC | PRN
  Administered 2014-03-26 (×2): 100 ug via INTRAVENOUS

## 2014-03-26 MED ORDER — SODIUM CHLORIDE 0.9 % IV SOLN: INTRAVENOUS | Status: DC

## 2014-03-26 MED ORDER — GABAPENTIN 600 MG PO TABS
600.0000 mg | ORAL_TABLET | Freq: Two times a day (BID) | ORAL | Status: DC
  Administered 2014-03-27 – 2014-03-30 (×7): 600 mg via ORAL
  Filled 2014-03-26 (×9): qty 1

## 2014-03-26 MED ORDER — LACTATED RINGERS IV SOLN
INTRAVENOUS | Status: DC
  Administered 2014-03-26: 12:00:00 via INTRAVENOUS

## 2014-03-26 MED ORDER — CEFAZOLIN SODIUM-DEXTROSE 2-3 GM-% IV SOLR: 2.0000 g | Freq: Four times a day (QID) | INTRAVENOUS | Status: DC

## 2014-03-26 MED ORDER — ONDANSETRON HCL 4 MG PO TABS: 4.0000 mg | ORAL_TABLET | Freq: Four times a day (QID) | ORAL | Status: DC | PRN

## 2014-03-26 MED ORDER — METHOCARBAMOL 500 MG PO TABS
500.0000 mg | ORAL_TABLET | Freq: Four times a day (QID) | ORAL | Status: DC | PRN
  Filled 2014-03-26: qty 1

## 2014-03-26 MED ORDER — ONDANSETRON HCL 4 MG/2ML IJ SOLN: 4.0000 mg | Freq: Four times a day (QID) | INTRAMUSCULAR | Status: DC | PRN

## 2014-03-26 MED ORDER — PROPOFOL 10 MG/ML IV BOLUS
INTRAVENOUS | Status: AC
  Filled 2014-03-26: qty 20

## 2014-03-26 MED ORDER — DILTIAZEM HCL ER COATED BEADS 120 MG PO CP24
120.0000 mg | ORAL_CAPSULE | Freq: Every day | ORAL | Status: DC
  Administered 2014-03-27 – 2014-03-30 (×4): 120 mg via ORAL
  Filled 2014-03-26 (×4): qty 1

## 2014-03-26 MED ORDER — HYDROMORPHONE HCL 1 MG/ML IJ SOLN
0.5000 mg | INTRAMUSCULAR | Status: DC | PRN
  Filled 2014-03-26 (×3): qty 1

## 2014-03-26 MED ORDER — DEXMEDETOMIDINE HCL IN NACL 200 MCG/50ML IV SOLN
INTRAVENOUS | Status: AC
  Filled 2014-03-26: qty 50

## 2014-03-26 MED ORDER — FLUTICASONE FUROATE-VILANTEROL 100-25 MCG/INH IN AEPB: 1.0000 | INHALATION_SPRAY | Freq: Two times a day (BID) | RESPIRATORY_TRACT | Status: DC

## 2014-03-26 MED ORDER — DEXMEDETOMIDINE HCL IN NACL 200 MCG/50ML IV SOLN
0.4000 ug/kg/h | INTRAVENOUS | Status: DC
  Filled 2014-03-26: qty 50

## 2014-03-26 MED ORDER — IPRATROPIUM BROMIDE HFA 17 MCG/ACT IN AERS
INHALATION_SPRAY | RESPIRATORY_TRACT | Status: DC | PRN
  Administered 2014-03-26 (×5): 6 via RESPIRATORY_TRACT

## 2014-03-26 MED ORDER — SODIUM CHLORIDE 0.9 % IV SOLN
INTRAVENOUS | Status: DC
Start: 2014-03-26 — End: 2014-03-29

## 2014-03-26 MED ORDER — ALBUTEROL SULFATE (2.5 MG/3ML) 0.083% IN NEBU
2.5000 mg | INHALATION_SOLUTION | Freq: Once | RESPIRATORY_TRACT | Status: AC
  Administered 2014-03-26: 2.5 mg via RESPIRATORY_TRACT

## 2014-03-26 MED ORDER — NALOXONE HCL 0.4 MG/ML IJ SOLN
0.4000 mg | INTRAMUSCULAR | Status: DC | PRN
  Administered 2014-03-26: 0.4 mg via INTRAVENOUS

## 2014-03-26 MED ORDER — METHOCARBAMOL 1000 MG/10ML IJ SOLN
500.0000 mg | Freq: Four times a day (QID) | INTRAVENOUS | Status: DC | PRN
  Filled 2014-03-26: qty 5

## 2014-03-26 MED ORDER — ALBUTEROL SULFATE (2.5 MG/3ML) 0.083% IN NEBU
INHALATION_SOLUTION | RESPIRATORY_TRACT | Status: AC
  Filled 2014-03-26: qty 3

## 2014-03-26 MED ORDER — 0.9 % SODIUM CHLORIDE (POUR BTL) OPTIME
TOPICAL | Status: DC | PRN
  Administered 2014-03-26: 1000 mL

## 2014-03-26 MED ORDER — OXYCODONE-ACETAMINOPHEN 5-325 MG PO TABS: 1.0000 | ORAL_TABLET | ORAL | Status: DC | PRN

## 2014-03-26 MED ORDER — MIDAZOLAM HCL 2 MG/2ML IJ SOLN
INTRAMUSCULAR | Status: AC
  Filled 2014-03-26: qty 2

## 2014-03-26 MED ORDER — DOCUSATE SODIUM 100 MG PO CAPS: 100.0000 mg | ORAL_CAPSULE | Freq: Two times a day (BID) | ORAL | Status: DC

## 2014-03-26 MED ORDER — OXYCODONE HCL 5 MG PO TABS: 10.0000 mg | ORAL_TABLET | ORAL | Status: DC | PRN

## 2014-03-26 MED ORDER — METOCLOPRAMIDE HCL 5 MG PO TABS
5.0000 mg | ORAL_TABLET | Freq: Three times a day (TID) | ORAL | Status: DC | PRN
  Filled 2014-03-26: qty 2

## 2014-03-26 MED ORDER — FUROSEMIDE 20 MG PO TABS
20.0000 mg | ORAL_TABLET | Freq: Two times a day (BID) | ORAL | Status: DC
  Filled 2014-03-26 (×2): qty 1

## 2014-03-26 MED ORDER — CEFAZOLIN SODIUM-DEXTROSE 2-3 GM-% IV SOLR
2.0000 g | Freq: Four times a day (QID) | INTRAVENOUS | Status: AC
  Administered 2014-03-26 – 2014-03-27 (×3): 2 g via INTRAVENOUS
  Filled 2014-03-26 (×4): qty 50

## 2014-03-26 MED ORDER — HYDROMORPHONE HCL 1 MG/ML IJ SOLN: 0.5000 mg | INTRAMUSCULAR | Status: DC | PRN

## 2014-03-26 MED ORDER — LACTATED RINGERS IV SOLN
INTRAVENOUS | Status: DC | PRN
  Administered 2014-03-26: 13:00:00 via INTRAVENOUS

## 2014-03-26 MED ORDER — FENTANYL CITRATE 0.05 MG/ML IJ SOLN
INTRAMUSCULAR | Status: AC
  Filled 2014-03-26: qty 5

## 2014-03-26 MED ORDER — METOCLOPRAMIDE HCL 5 MG PO TABS: 5.0000 mg | ORAL_TABLET | Freq: Three times a day (TID) | ORAL | Status: DC | PRN

## 2014-03-26 MED ORDER — METHOCARBAMOL 500 MG PO TABS: 500.0000 mg | ORAL_TABLET | Freq: Four times a day (QID) | ORAL | Status: DC | PRN

## 2014-03-26 MED ORDER — BISACODYL 5 MG PO TBEC
5.0000 mg | DELAYED_RELEASE_TABLET | Freq: Every day | ORAL | Status: DC | PRN
  Filled 2014-03-26: qty 1

## 2014-03-26 MED ORDER — METHOCARBAMOL 1000 MG/10ML IJ SOLN: 500.0000 mg | Freq: Four times a day (QID) | INTRAVENOUS | Status: DC | PRN

## 2014-03-26 MED ORDER — SODIUM CHLORIDE 0.9 % IV SOLN
INTRAVENOUS | Status: DC
  Administered 2014-03-26: 10 mL/h via INTRAVENOUS

## 2014-03-26 MED ORDER — PHENYLEPHRINE HCL 10 MG/ML IJ SOLN
30.0000 ug/min | INTRAVENOUS | Status: DC
  Administered 2014-03-26: 10 ug/min via INTRAVENOUS
  Administered 2014-03-27 (×2): 40 ug/min via INTRAVENOUS
  Administered 2014-03-27: 50 ug/min via INTRAVENOUS
  Administered 2014-03-27: 35 ug/min via INTRAVENOUS
  Administered 2014-03-28: 50 ug/min via INTRAVENOUS
  Administered 2014-03-28: 60 ug/min via INTRAVENOUS
  Administered 2014-03-28: 20 ug/min via INTRAVENOUS
  Administered 2014-03-28: 50 ug/min via INTRAVENOUS
  Administered 2014-03-28: 55 ug/min via INTRAVENOUS
  Administered 2014-03-28: 50 ug/min via INTRAVENOUS
  Filled 2014-03-26 (×15): qty 1

## 2014-03-26 MED ORDER — ALBUTEROL SULFATE HFA 108 (90 BASE) MCG/ACT IN AERS: 1.0000 | INHALATION_SPRAY | RESPIRATORY_TRACT | Status: DC | PRN

## 2014-03-26 MED ORDER — ALBUTEROL SULFATE (2.5 MG/3ML) 0.083% IN NEBU: 2.5000 mg | INHALATION_SOLUTION | RESPIRATORY_TRACT | Status: DC | PRN

## 2014-03-26 MED ORDER — ALPRAZOLAM 0.5 MG PO TABS
0.5000 mg | ORAL_TABLET | Freq: Three times a day (TID) | ORAL | Status: DC | PRN
  Administered 2014-03-29 (×2): 0.5 mg via ORAL
  Filled 2014-03-26 (×3): qty 1

## 2014-03-26 MED ORDER — DEXMEDETOMIDINE HCL IN NACL 400 MCG/100ML IV SOLN
0.4000 ug/kg/h | INTRAVENOUS | Status: DC
  Administered 2014-03-26 – 2014-03-27 (×3): 0.7 ug/kg/h via INTRAVENOUS
  Administered 2014-03-27: 0.3 ug/kg/h via INTRAVENOUS
  Administered 2014-03-27: 0.7 ug/kg/h via INTRAVENOUS
  Administered 2014-03-27 – 2014-03-28 (×2): 0.3 ug/kg/h via INTRAVENOUS
  Filled 2014-03-26 (×8): qty 100

## 2014-03-26 MED ORDER — FENTANYL CITRATE 0.05 MG/ML IJ SOLN
INTRAMUSCULAR | Status: DC | PRN
  Administered 2014-03-26: 100 ug via INTRAVENOUS

## 2014-03-26 MED ORDER — PROPOFOL 10 MG/ML IV BOLUS
INTRAVENOUS | Status: DC | PRN
  Administered 2014-03-26: 220 mg via INTRAVENOUS

## 2014-03-26 MED ORDER — LIDOCAINE HCL (CARDIAC) 20 MG/ML IV SOLN
INTRAVENOUS | Status: DC | PRN
  Administered 2014-03-26: 50 mg via INTRAVENOUS

## 2014-03-26 MED ORDER — MAGNESIUM CITRATE PO SOLN
1.0000 | Freq: Once | ORAL | Status: AC | PRN
  Filled 2014-03-26: qty 296

## 2014-03-26 MED ORDER — INSULIN ASPART 100 UNIT/ML ~~LOC~~ SOLN
2.0000 [IU] | SUBCUTANEOUS | Status: DC
  Administered 2014-03-26 – 2014-03-28 (×6): 2 [IU] via SUBCUTANEOUS

## 2014-03-26 MED ORDER — DEXTROSE 5 % IV SOLN: 500.0000 mg | Freq: Four times a day (QID) | INTRAVENOUS | Status: DC | PRN

## 2014-03-26 MED ORDER — VANCOMYCIN HCL 500 MG IV SOLR
INTRAVENOUS | Status: AC
  Filled 2014-03-26: qty 500

## 2014-03-26 MED ORDER — GENTAMICIN SULFATE 40 MG/ML IJ SOLN
INTRAMUSCULAR | Status: DC | PRN
  Administered 2014-03-26: 160 mg

## 2014-03-26 MED ORDER — DEXTROSE 5 % IV SOLN
500.0000 mg | INTRAVENOUS | Status: DC
  Filled 2014-03-26: qty 5

## 2014-03-26 MED ORDER — ONDANSETRON HCL 4 MG/2ML IJ SOLN
INTRAMUSCULAR | Status: DC | PRN
Start: 2014-03-26 — End: 2014-03-26
  Administered 2014-03-26: 4 mg via INTRAVENOUS

## 2014-03-26 MED ORDER — SENNOSIDES-DOCUSATE SODIUM 8.6-50 MG PO TABS
1.0000 | ORAL_TABLET | Freq: Every evening | ORAL | Status: DC | PRN
  Filled 2014-03-26: qty 1

## 2014-03-26 MED ORDER — GENTAMICIN SULFATE 40 MG/ML IJ SOLN
INTRAMUSCULAR | Status: AC
  Filled 2014-03-26: qty 4

## 2014-03-26 MED ORDER — IPRATROPIUM-ALBUTEROL 0.5-2.5 (3) MG/3ML IN SOLN
3.0000 mL | Freq: Two times a day (BID) | RESPIRATORY_TRACT | Status: DC
  Administered 2014-03-26 – 2014-03-29 (×6): 3 mL via RESPIRATORY_TRACT
  Filled 2014-03-26 (×6): qty 3

## 2014-03-26 MED ORDER — NALOXONE HCL 0.4 MG/ML IJ SOLN
INTRAMUSCULAR | Status: AC
  Filled 2014-03-26: qty 1

## 2014-03-26 MED ORDER — FENTANYL CITRATE 0.05 MG/ML IJ SOLN
100.0000 ug | INTRAMUSCULAR | Status: DC | PRN
  Administered 2014-03-27 – 2014-03-28 (×4): 100 ug via INTRAVENOUS
  Filled 2014-03-26 (×5): qty 2

## 2014-03-26 MED ORDER — FUROSEMIDE 10 MG/ML IJ SOLN
80.0000 mg | Freq: Two times a day (BID) | INTRAMUSCULAR | Status: DC
  Administered 2014-03-26 – 2014-03-27 (×2): 80 mg via INTRAVENOUS
  Filled 2014-03-26 (×4): qty 8

## 2014-03-26 SURGICAL SUPPLY — 61 items
BANDAGE ESMARK 6X9 LF (GAUZE/BANDAGES/DRESSINGS) ×1 IMPLANT
BLADE SAW RECIP 87.9 MT (BLADE) ×2 IMPLANT
BNDG CMPR 9X6 STRL LF SNTH (GAUZE/BANDAGES/DRESSINGS)
BNDG COHESIVE 6X5 TAN STRL LF (GAUZE/BANDAGES/DRESSINGS) ×4 IMPLANT
BNDG ESMARK 6X9 LF (GAUZE/BANDAGES/DRESSINGS)
BNDG GAUZE ELAST 4 BULKY (GAUZE/BANDAGES/DRESSINGS) ×2 IMPLANT
BNDG GAUZE STRTCH 6 (GAUZE/BANDAGES/DRESSINGS) IMPLANT
COVER SURGICAL LIGHT HANDLE (MISCELLANEOUS) ×3 IMPLANT
CUFF TOURNIQUET SINGLE 34IN LL (TOURNIQUET CUFF) IMPLANT
DRAIN PENROSE 1/2X12 LTX STRL (WOUND CARE) IMPLANT
DRAPE EXTREMITY T 121X128X90 (DRAPE) ×3 IMPLANT
DRAPE PROXIMA HALF (DRAPES) ×6 IMPLANT
DRAPE U-SHAPE 47X51 STRL (DRAPES) ×4 IMPLANT
DRSG ADAPTIC 3X8 NADH LF (GAUZE/BANDAGES/DRESSINGS) ×3 IMPLANT
DURAPREP 26ML APPLICATOR (WOUND CARE) ×3 IMPLANT
ELECT CAUTERY BLADE 6.4 (BLADE) ×2 IMPLANT
ELECT REM PT RETURN 9FT ADLT (ELECTROSURGICAL) ×3
ELECTRODE REM PT RTRN 9FT ADLT (ELECTROSURGICAL) ×1 IMPLANT
EVACUATOR 1/8 PVC DRAIN (DRAIN) IMPLANT
GAUZE SPONGE 4X4 12PLY STRL (GAUZE/BANDAGES/DRESSINGS) ×3 IMPLANT
GLOVE BIOGEL PI IND STRL 7.0 (GLOVE) IMPLANT
GLOVE BIOGEL PI IND STRL 7.5 (GLOVE) IMPLANT
GLOVE BIOGEL PI IND STRL 9 (GLOVE) ×1 IMPLANT
GLOVE BIOGEL PI INDICATOR 7.0 (GLOVE) ×2
GLOVE BIOGEL PI INDICATOR 7.5 (GLOVE) ×2
GLOVE BIOGEL PI INDICATOR 9 (GLOVE) ×2
GLOVE ECLIPSE 7.0 STRL STRAW (GLOVE) ×2 IMPLANT
GLOVE SURG ORTHO 9.0 STRL STRW (GLOVE) ×5 IMPLANT
GLOVE SURG SS PI 7.0 STRL IVOR (GLOVE) ×2 IMPLANT
GOWN STRL REUS W/ TWL LRG LVL3 (GOWN DISPOSABLE) IMPLANT
GOWN STRL REUS W/ TWL XL LVL3 (GOWN DISPOSABLE) ×2 IMPLANT
GOWN STRL REUS W/TWL LRG LVL3 (GOWN DISPOSABLE) ×6
GOWN STRL REUS W/TWL XL LVL3 (GOWN DISPOSABLE) ×3
KIT BASIN OR (CUSTOM PROCEDURE TRAY) ×3 IMPLANT
KIT ROOM TURNOVER OR (KITS) ×3 IMPLANT
KIT STIMULAN RAPID CURE 5CC (Orthopedic Implant) ×2 IMPLANT
MANIFOLD NEPTUNE II (INSTRUMENTS) ×1 IMPLANT
NDL 18GX1X1/2 (RX/OR ONLY) (NEEDLE) IMPLANT
NEEDLE 18GX1X1/2 (RX/OR ONLY) (NEEDLE) ×3 IMPLANT
NS IRRIG 1000ML POUR BTL (IV SOLUTION) ×3 IMPLANT
PACK GENERAL/GYN (CUSTOM PROCEDURE TRAY) ×3 IMPLANT
PAD ABD 8X10 STRL (GAUZE/BANDAGES/DRESSINGS) ×4 IMPLANT
PAD ARMBOARD 7.5X6 YLW CONV (MISCELLANEOUS) ×6 IMPLANT
PAD CAST 4YDX4 CTTN HI CHSV (CAST SUPPLIES) ×1 IMPLANT
PADDING CAST COTTON 4X4 STRL (CAST SUPPLIES)
PADDING CAST COTTON 6X4 STRL (CAST SUPPLIES) ×1 IMPLANT
SPONGE LAP 18X18 X RAY DECT (DISPOSABLE) IMPLANT
STAPLER VISISTAT 35W (STAPLE) ×2 IMPLANT
STOCKINETTE IMPERVIOUS LG (DRAPES) IMPLANT
SUT ETHILON 2 0 PSLX (SUTURE) ×6 IMPLANT
SUT PDS AB 1 CT  36 (SUTURE)
SUT PDS AB 1 CT 36 (SUTURE) IMPLANT
SUT SILK 2 0 (SUTURE)
SUT SILK 2-0 18XBRD TIE 12 (SUTURE) ×1 IMPLANT
SUT VIC AB 1 CTX 36 (SUTURE)
SUT VIC AB 1 CTX36XBRD ANBCTR (SUTURE) IMPLANT
SYRINGE 10CC LL (SYRINGE) ×2 IMPLANT
TOWEL OR 17X24 6PK STRL BLUE (TOWEL DISPOSABLE) ×1 IMPLANT
TOWEL OR 17X26 10 PK STRL BLUE (TOWEL DISPOSABLE) ×3 IMPLANT
TUBE ANAEROBIC SPECIMEN COL (MISCELLANEOUS) IMPLANT
WATER STERILE IRR 1000ML POUR (IV SOLUTION) ×1 IMPLANT

## 2014-03-26 NOTE — Anesthesia Preprocedure Evaluation (Signed)
Anesthesia Evaluation  Patient identified by MRN, date of birth, ID band Patient awake    Reviewed: Allergy & Precautions, NPO status , Patient's Chart, lab work & pertinent test results, Unable to perform ROS - Chart review only  Airway Mallampati: III  TM Distance: >3 FB Neck ROM: Full    Dental  (+) Teeth Intact, Dental Advisory Given   Pulmonary Current Smoker,  breath sounds clear to auscultation        Cardiovascular hypertension, Rhythm:Regular Rate:Normal     Neuro/Psych    GI/Hepatic   Endo/Other  diabetes  Renal/GU      Musculoskeletal   Abdominal (+) + obese,   Peds  Hematology   Anesthesia Other Findings   Reproductive/Obstetrics                             Anesthesia Physical Anesthesia Plan  ASA: III  Anesthesia Plan: General   Post-op Pain Management:    Induction: Intravenous  Airway Management Planned: Oral ETT  Additional Equipment:   Intra-op Plan:   Post-operative Plan: Extubation in OR  Informed Consent: I have reviewed the patients History and Physical, chart, labs and discussed the procedure including the risks, benefits and alternatives for the proposed anesthesia with the patient or authorized representative who has indicated his/her understanding and acceptance.   Dental advisory given  Plan Discussed with: CRNA and Anesthesiologist  Anesthesia Plan Comments: (Severe Obesity Hypertension Smoker Borderline DM  Plan GA with oral ETT  [name])        Anesthesia Quick Evaluation

## 2014-03-26 NOTE — Progress Notes (Signed)
Plan for discharge to home Saturday morning after overnight observation. Prescription was written for advanced prosthetic for a stump shrinker as per the patient request. Plan to follow-up in the office in 2 weeks.

## 2014-03-26 NOTE — Progress Notes (Signed)
abg results called to Dr Linna Caprice. He will consult CCM.  He has also updated Dr Sharol Given of decrease in LOC, and need for BiPap

## 2014-03-26 NOTE — Progress Notes (Signed)
Pt intubated per DR Linna Caprice and Tawni Levy CRNA.  Using glide scope.  ett 7.5, 100% Fio2, peep 5 rate 16.
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
## 2014-03-26 NOTE — Progress Notes (Signed)
abgsent

## 2014-03-26 NOTE — Progress Notes (Signed)
Orthopedic Tech Progress Note Patient Details:  Dean Bowers 1970/12/29 [id number] Advanced contacted for brace order Patient ID: DODGE ATOR, male   DOB: 03/13/1970, 44 y.o.   MRN: [id number]   Fenton Foy 03/26/2014, 4:45 PM

## 2014-03-26 NOTE — Progress Notes (Signed)
precedex started per Md order

## 2014-03-26 NOTE — Progress Notes (Signed)
Anesthesiology Note:  44 year old male with morbid obesity chronic CO2 retention and probable sleep apnea underwent I and D and  revision of R. below knee amputation. He underwent the procedure under general endotracheal anesthesia and was slow on emergence but was extubated in the OR. In the PACU following surgery, he was initially somnolent but followed commands.Over the next several hours, he became increasingly somnolent and was given 0.4 mg narcan and started on BIPAP with minimal improvement.  ABG in PACU on 60% BIPAP PH 7.18 PCO2 97.4 PO2 53.2  The decision was then made to re-intubate and consult CCM for ventilator management.  He was given 200 mg propofol and 180 mg succinyl choline. He was then re-intubated using the video laryngoscope with good visualization of the vocal cords on the first attempt. A 7.5 glottic suction ETT passed without difficulty.   CXR: ETT tube tip at mid-trachea, with bilateral opacification of lung filds partially due to under penetration.  Impression: Post-op hypercarbic respiratory failure in 44 year old male with severe morbid obesity and chronic CO2 retention.  Plan:  1. Full ventilatory support with precedex for sedation. 2. Transfer to 3S 3. Consult CCM 4. Family apprised of patient's condition. All questions answered.  [name]

## 2014-03-26 NOTE — Progress Notes (Signed)
Pt less responsive.  Will not answer orientation questions, FC's, take deep breaths.  Dr Linna Caprice notified.

## 2014-03-26 NOTE — Progress Notes (Signed)
Hypotensive due to precedex Needs lasix   Plan Dc fluids at 100cc Start neo gtt  Dr. Brand Males, M.D., [facility].C.P Pulmonary and Critical Care Medicine Staff Physician [place] [facility] Pager: [phone], If no answer or between  15:00h - 7:00h: call [phone]  03/26/2014 8:09 PM

## 2014-03-26 NOTE — Op Note (Signed)
03/26/2014  5:15 PM  PATIENT:  Dean Bowers    PRE-OPERATIVE DIAGNOSIS:  Abscess Right Below Knee Amputation  POST-OPERATIVE DIAGNOSIS:  Same  PROCEDURE:  Revision Right Below Knee Amputation Placement of antibiotic beads with 500 mg vancomycin and 160 mg gentamicin  SURGEON:  Newt Minion, MD  PHYSICIAN ASSISTANT:None ANESTHESIA:   General  PREOPERATIVE INDICATIONS:  PRAVIN PEREZPEREZ is a  44 y.o. male with a diagnosis of Abscess Right Below Knee Amputation who failed conservative measures and elected for surgical management.    The risks benefits and alternatives were discussed with the patient preoperatively including but not limited to the risks of infection, bleeding, nerve injury, cardiopulmonary complications, the need for revision surgery, among others, and the patient was willing to proceed.  OPERATIVE IMPLANTS: Antibiotic beads with vancomycin and gentamicin  OPERATIVE FINDINGS: No deep abscess  OPERATIVE PROCEDURE: Patient was brought to the operating room and underwent a general anesthetic. After adequate levels of anesthesia were obtained patient's right lower extremity was prepped using DuraPrep draped into a sterile field. A timeout was called. A fishmouth incision was made over the indurated ulcerative skin this was resected in 1 block of tissue and this included the distal 2 cm of the distal tibia and fibula. This was all resected in 1 block of tissue. All tissue that remained was healthy viable and contractile. The wound was irrigated and hemostasis was obtained. Interbody beads were placed deep in the wound including in the tibial shaft. The incision was closed using 2-0 nylon. A sterile compressive dressing was applied. Patient was extubated taken to the PACU in stable condition.

## 2014-03-26 NOTE — Progress Notes (Signed)
Critical ABG result reported to Dr. Tamala Julian with [facility]. RT will monitor.

## 2014-03-26 NOTE — Progress Notes (Signed)
Critical ABG results reported to Dr. Tamala Julian with [facility]. Order put in for vent adjustments. Repeat ABG AT 2300.

## 2014-03-26 NOTE — Progress Notes (Signed)
eLink Physician-Brief Progress Note Patient Name: Dean Bowers DOB: 1970-04-30 MRN: [id number]   Date of Service  03/26/2014  HPI/Events of Note  Patient with acute on chronic resp acidosis and hypoxemia on ABG.  Patient on 100% FIO2 and PEEP 10.  7.36/63/52/36.  eICU Interventions  Decrease TV from 8cc/kg to 7 cc/kg Increase PEEP to 14 Repeat abg at 2300     Intervention Category Major Interventions: Hypoxemia - evaluation and management  Mauri Brooklyn, P 03/26/2014, 9:35 PM

## 2014-03-26 NOTE — Transfer of Care (Signed)
Immediate Anesthesia Transfer of Care Note  Patient: Dean Bowers  Procedure(s) Performed: Procedure(s): Revision Right Below Knee Amputation (Right)  Patient Location: PACU  Anesthesia Type:General  Level of Consciousness: awake, alert , oriented and patient cooperative  Airway & Oxygen Therapy: Patient Spontanous Breathing and Patient connected to face mask oxygen  Post-op Assessment: Report given to RN and Post -op Vital signs reviewed and stable  Post vital signs: Reviewed and stable  Last Vitals:  Filed Vitals:   03/26/14 1430  BP:   Pulse:   Temp: 36.4 C  Resp:     Complications: No apparent anesthesia complications

## 2014-03-26 NOTE — Progress Notes (Signed)
Rn calling elink  1. Xtra PIV order placed  2. CXR reiewed -- looks wet - high dose lasi bid oprdered , patient also needing high peep  Dr. Brand Males, M.D., [facility].C.P Pulmonary and Critical Care Medicine Staff Physician [place] [facility] Pager: [phone], If no answer or between  15:00h - 7:00h: call [phone]  03/26/2014 6:58 PM

## 2014-03-26 NOTE — Progress Notes (Signed)
Dr Linna Caprice at bedside. Order received for Bipap.

## 2014-03-27 ENCOUNTER — Encounter (HOSPITAL_COMMUNITY): Payer: Self-pay | Admitting: Orthopedic Surgery

## 2014-03-27 ENCOUNTER — Encounter: Payer: Self-pay | Admitting: Anesthesiology

## 2014-03-27 ENCOUNTER — Inpatient Hospital Stay (HOSPITAL_COMMUNITY): Payer: 59

## 2014-03-27 DIAGNOSIS — J9621 Acute and chronic respiratory failure with hypoxia: Secondary | ICD-10-CM | POA: Insufficient documentation

## 2014-03-27 DIAGNOSIS — J95821 Acute postprocedural respiratory failure: Secondary | ICD-10-CM

## 2014-03-27 LAB — MRSA PCR SCREENING: MRSA by PCR: NEGATIVE

## 2014-03-27 LAB — CBC
HEMATOCRIT: 43.4 % (ref 39.0–52.0)
HEMOGLOBIN: 12 g/dL — AB (ref 13.0–17.0)
MCH: 20.6 pg — ABNORMAL LOW (ref 26.0–34.0)
MCHC: 27.6 g/dL — ABNORMAL LOW (ref 30.0–36.0)
MCV: 74.6 fL — ABNORMAL LOW (ref 78.0–100.0)
PLATELETS: 145 10*3/uL — AB (ref 150–400)
RBC: 5.82 MIL/uL — ABNORMAL HIGH (ref 4.22–5.81)
RDW: 20.6 % — AB (ref 11.5–15.5)
WBC: 9.8 10*3/uL (ref 4.0–10.5)

## 2014-03-27 LAB — POCT I-STAT 3, ART BLOOD GAS (G3+)
Acid-Base Excess: 8 mmol/L — ABNORMAL HIGH (ref 0.0–2.0)
Bicarbonate: 36.9 mEq/L — ABNORMAL HIGH (ref 20.0–24.0)
O2 Saturation: 99 %
Patient temperature: 98.6
TCO2: 39 mmol/L (ref 0–100)
pCO2 arterial: 69.5 mmHg (ref 35.0–45.0)
pH, Arterial: 7.333 — ABNORMAL LOW (ref 7.350–7.450)
pO2, Arterial: 166 mmHg — ABNORMAL HIGH (ref 80.0–100.0)

## 2014-03-27 LAB — GLUCOSE, CAPILLARY
GLUCOSE-CAPILLARY: 105 mg/dL — AB (ref 70–99)
Glucose-Capillary: 116 mg/dL — ABNORMAL HIGH (ref 70–99)
Glucose-Capillary: 121 mg/dL — ABNORMAL HIGH (ref 70–99)
Glucose-Capillary: 124 mg/dL — ABNORMAL HIGH (ref 70–99)
Glucose-Capillary: 101 mg/dL — ABNORMAL HIGH (ref 70–99)
Glucose-Capillary: 127 mg/dL — ABNORMAL HIGH (ref 70–99)

## 2014-03-27 LAB — BASIC METABOLIC PANEL
ANION GAP: 12 (ref 5–15)
BUN: 12 mg/dL (ref 6–23)
CALCIUM: 8.8 mg/dL (ref 8.4–10.5)
CO2: 27 mmol/L (ref 19–32)
CREATININE: 0.94 mg/dL (ref 0.50–1.35)
Chloride: 101 mmol/L (ref 96–112)
GFR calc Af Amer: >90 mL/min (ref >90)
GFR calc non Af Amer: >90 mL/min (ref >90)
Glucose, Bld: 114 mg/dL — ABNORMAL HIGH (ref 70–99)
Potassium: 5.2 mmol/L — ABNORMAL HIGH (ref 3.5–5.1)
Sodium: 140 mmol/L (ref 135–145)

## 2014-03-27 LAB — TROPONIN I: Troponin I: 0.03 ng/mL (ref <0.031)

## 2014-03-27 LAB — PHOSPHORUS: Phosphorus: 4.6 mg/dL (ref 2.3–4.6)

## 2014-03-27 LAB — MAGNESIUM: Magnesium: 2 mg/dL (ref 1.5–2.5)

## 2014-03-27 MED ORDER — CETYLPYRIDINIUM CHLORIDE 0.05 % MT LIQD
7.0000 mL | Freq: Four times a day (QID) | OROMUCOSAL | Status: DC
  Administered 2014-03-27 – 2014-03-29 (×9): 7 mL via OROMUCOSAL

## 2014-03-27 MED ORDER — DOCUSATE SODIUM 50 MG/5ML PO LIQD
100.0000 mg | Freq: Two times a day (BID) | ORAL | Status: DC
  Administered 2014-03-27 – 2014-03-28 (×3): 100 mg via ORAL
  Filled 2014-03-27 (×5): qty 10

## 2014-03-27 MED ORDER — CHLORHEXIDINE GLUCONATE 0.12 % MT SOLN
15.0000 mL | Freq: Two times a day (BID) | OROMUCOSAL | Status: DC
  Administered 2014-03-27 – 2014-03-29 (×6): 15 mL via OROMUCOSAL
  Filled 2014-03-27 (×6): qty 15

## 2014-03-27 MED ORDER — FUROSEMIDE 10 MG/ML IJ SOLN
80.0000 mg | Freq: Three times a day (TID) | INTRAMUSCULAR | Status: DC
  Administered 2014-03-27 (×2): 80 mg via INTRAVENOUS
  Filled 2014-03-27 (×3): qty 8

## 2014-03-27 MED ORDER — PANTOPRAZOLE SODIUM 40 MG PO PACK
40.0000 mg | PACK | Freq: Every day | ORAL | Status: DC
  Administered 2014-03-27: 40 mg
  Filled 2014-03-27 (×3): qty 20

## 2014-03-27 MED ORDER — ENOXAPARIN SODIUM 40 MG/0.4ML ~~LOC~~ SOLN
40.0000 mg | SUBCUTANEOUS | Status: DC
  Administered 2014-03-27 – 2014-03-30 (×4): 40 mg via SUBCUTANEOUS
  Filled 2014-03-27 (×4): qty 0.4

## 2014-03-27 MED ORDER — VITAL HIGH PROTEIN PO LIQD
1000.0000 mL | ORAL | Status: DC
  Administered 2014-03-27: 1000 mL
  Filled 2014-03-27 (×6): qty 1000

## 2014-03-27 NOTE — Progress Notes (Signed)
Critical ABG results reported to [facility]. RT will make changes as ordered.

## 2014-03-27 NOTE — Anesthesia Postprocedure Evaluation (Signed)
  Anesthesia Post-op Note  Patient: Dean Bowers  Procedure(s) Performed: Procedure(s): Revision Right Below Knee Amputation (Right)  Patient Location: SICU  Anesthesia Type:General  Level of Consciousness: awake and sedated  Airway and Oxygen Therapy: Patient remains intubated per anesthesia plan and Patient placed on Ventilator (see vital sign flow sheet for setting)  Post-op Pain: none  Post-op Assessment: Post-op Vital signs reviewed, Patient's Cardiovascular Status Stable, Respiratory Function Stable, Patent Airway and No signs of Nausea or vomiting  Post-op Vital Signs: stable  Last Vitals:  Filed Vitals:   03/27/14 1700  BP:   Pulse: 74  Temp:   Resp: 15    Complications: No apparent anesthesia complications

## 2014-03-27 NOTE — Progress Notes (Signed)
Dressings c/d/i.  Pt remains intubated in critical condition.  NWB RLE.  Up with PT when able.  Appreciate critical care.  Azucena Cecil, MD [facility] [phone] 8:06 AM
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
## 2014-03-28 ENCOUNTER — Inpatient Hospital Stay (HOSPITAL_COMMUNITY): Payer: 59

## 2014-03-28 ENCOUNTER — Other Ambulatory Visit: Payer: Self-pay

## 2014-03-28 LAB — CBC WITH DIFFERENTIAL/PLATELET
Basophils Absolute: 0 10*3/uL (ref 0.0–0.1)
Basophils Relative: 0 % (ref 0–1)
Eosinophils Absolute: 0 10*3/uL (ref 0.0–0.7)
Eosinophils Relative: 0 % (ref 0–5)
HEMATOCRIT: 38.3 % — AB (ref 39.0–52.0)
HEMOGLOBIN: 10.5 g/dL — AB (ref 13.0–17.0)
Lymphocytes Relative: 14 % (ref 12–46)
Lymphs Abs: 1.5 10*3/uL (ref 0.7–4.0)
MCH: 19.7 pg — ABNORMAL LOW (ref 26.0–34.0)
MCHC: 27.4 g/dL — AB (ref 30.0–36.0)
MCV: 71.9 fL — AB (ref 78.0–100.0)
Monocytes Absolute: 1.1 10*3/uL — ABNORMAL HIGH (ref 0.1–1.0)
Monocytes Relative: 11 % (ref 3–12)
NEUTROS PCT: 75 % (ref 43–77)
Neutro Abs: 7.8 10*3/uL — ABNORMAL HIGH (ref 1.7–7.7)
PLATELETS: 266 10*3/uL (ref 150–400)
RBC: 5.33 MIL/uL (ref 4.22–5.81)
RDW: 20.4 % — AB (ref 11.5–15.5)
WBC: 10.4 10*3/uL (ref 4.0–10.5)

## 2014-03-28 LAB — GLUCOSE, CAPILLARY
GLUCOSE-CAPILLARY: 93 mg/dL (ref 70–99)
Glucose-Capillary: 101 mg/dL — ABNORMAL HIGH (ref 70–99)
Glucose-Capillary: 104 mg/dL — ABNORMAL HIGH (ref 70–99)
Glucose-Capillary: 113 mg/dL — ABNORMAL HIGH (ref 70–99)
Glucose-Capillary: 115 mg/dL — ABNORMAL HIGH (ref 70–99)
Glucose-Capillary: 128 mg/dL — ABNORMAL HIGH (ref 70–99)
Glucose-Capillary: 131 mg/dL — ABNORMAL HIGH (ref 70–99)

## 2014-03-28 LAB — LACTIC ACID, PLASMA: Lactic Acid, Venous: 1 mmol/L (ref 0.5–2.0)

## 2014-03-28 LAB — BASIC METABOLIC PANEL
Anion gap: 8 (ref 5–15)
BUN: 11 mg/dL (ref 6–23)
CO2: 40 mmol/L — AB (ref 19–32)
Calcium: 9.4 mg/dL (ref 8.4–10.5)
Chloride: 94 mmol/L — ABNORMAL LOW (ref 96–112)
Creatinine, Ser: 0.8 mg/dL (ref 0.50–1.35)
GFR calc Af Amer: >90 mL/min (ref >90)
GFR calc non Af Amer: >90 mL/min (ref >90)
GLUCOSE: 115 mg/dL — AB (ref 70–99)
Potassium: 3 mmol/L — ABNORMAL LOW (ref 3.5–5.1)
Sodium: 142 mmol/L (ref 135–145)

## 2014-03-28 LAB — URINALYSIS, ROUTINE W REFLEX MICROSCOPIC
Bilirubin Urine: NEGATIVE
Glucose, UA: NEGATIVE mg/dL
Ketones, ur: NEGATIVE mg/dL
Nitrite: NEGATIVE
PH: 8.5 — AB (ref 5.0–8.0)
PROTEIN: NEGATIVE mg/dL
Specific Gravity, Urine: 1.017 (ref 1.005–1.030)
UROBILINOGEN UA: 1 mg/dL (ref 0.0–1.0)

## 2014-03-28 LAB — URINE MICROSCOPIC-ADD ON

## 2014-03-28 LAB — MAGNESIUM: MAGNESIUM: 1.8 mg/dL (ref 1.5–2.5)

## 2014-03-28 LAB — EXPECTORATED SPUTUM ASSESSMENT W GRAM STAIN, RFLX TO RESP C

## 2014-03-28 LAB — PHOSPHORUS: Phosphorus: 3.7 mg/dL (ref 2.3–4.6)

## 2014-03-28 LAB — EXPECTORATED SPUTUM ASSESSMENT W REFEX TO RESP CULTURE

## 2014-03-28 LAB — BRAIN NATRIURETIC PEPTIDE: B Natriuretic Peptide: 116.1 pg/mL — ABNORMAL HIGH (ref 0.0–100.0)

## 2014-03-28 LAB — TROPONIN I: Troponin I: <0.03 ng/mL (ref <0.031)

## 2014-03-28 MED ORDER — FUROSEMIDE 10 MG/ML IJ SOLN
40.0000 mg | Freq: Once | INTRAMUSCULAR | Status: AC
  Administered 2014-03-28: 40 mg via INTRAVENOUS

## 2014-03-28 MED ORDER — POTASSIUM CHLORIDE 10 MEQ/50ML IV SOLN: 10.0000 meq | INTRAVENOUS | Status: DC

## 2014-03-28 MED ORDER — FUROSEMIDE 10 MG/ML IJ SOLN
80.0000 mg | Freq: Two times a day (BID) | INTRAMUSCULAR | Status: DC
  Administered 2014-03-28 – 2014-03-29 (×3): 80 mg via INTRAVENOUS
  Filled 2014-03-28 (×3): qty 8

## 2014-03-28 MED ORDER — MAGNESIUM SULFATE 2 GM/50ML IV SOLN
2.0000 g | Freq: Once | INTRAVENOUS | Status: AC
  Administered 2014-03-28: 2 g via INTRAVENOUS
  Filled 2014-03-28: qty 50

## 2014-03-28 MED ORDER — CETYLPYRIDINIUM CHLORIDE 0.05 % MT LIQD
7.0000 mL | Freq: Two times a day (BID) | OROMUCOSAL | Status: DC
Start: 2014-03-28 — End: 2014-03-28

## 2014-03-28 MED ORDER — PANTOPRAZOLE SODIUM 40 MG IV SOLR
40.0000 mg | Freq: Every morning | INTRAVENOUS | Status: DC
  Administered 2014-03-28 – 2014-03-29 (×2): 40 mg via INTRAVENOUS
  Filled 2014-03-28 (×3): qty 40

## 2014-03-28 MED ORDER — POTASSIUM CHLORIDE 20 MEQ/15ML (10%) PO SOLN
20.0000 meq | Freq: Three times a day (TID) | ORAL | Status: AC
  Administered 2014-03-28 (×3): 20 meq via ORAL
  Filled 2014-03-28 (×3): qty 15

## 2014-03-28 MED ORDER — CHLORHEXIDINE GLUCONATE 0.12 % MT SOLN: 15.0000 mL | Freq: Two times a day (BID) | OROMUCOSAL | Status: DC

## 2014-03-28 NOTE — Progress Notes (Signed)
[place] Progress Note Patient Name: Dean Bowers DOB: September 27, 1970 MRN: [id number]   Date of Service  03/28/2014  HPI/Events of Note  Potassium 3.0  eICU Interventions  replaced     Intervention Category Minor Interventions: Electrolytes abnormality - evaluation and management  Mauri Brooklyn, P 03/28/2014, 2:20 AM

## 2014-03-28 NOTE — Progress Notes (Signed)
CRITICAL VALUE ALERT  Critical value received:  CO2 40  Date of notification:  03/28/2014  Time of notification:  8502  Critical value read back:Yes.    Nurse who received alert:  Glenford Peers RN  MD notified (1st page):  Dr Tamala Julian   Time of first page:  0314  MD notified (2nd page):  Time of second page:  Responding MD:  Dr Tamala Julian  Time MD responded:  [phone]

## 2014-03-28 NOTE — Consult Note (Signed)
Anesthesiology Follow-up:  He remains on ventilator sedated with precedex.   Vs: T- BP-115/53 HR 73 (SR) T-38.4  Vent settings: FiO2 50% TV-520 RR-20 PEEP-8 PIP-39 O2 Sat 95%  K-3.0 H/H: 10.5/38.5 BUN/Cr. 11/0.80   CXR: shows persistent blateral pulm opacities.   44 year old male with morbid obesity and chronic CO2 retention, suffered post-op respiratory failure following R.  BKA  and D and revision 2/18. On less PEEP, has been diuresed.  I suspect he can be extubated today.  [name]

## 2014-03-28 NOTE — Progress Notes (Signed)
Utilization Review Completed.Dean Bowers T2/21/2016  
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
## 2014-03-28 NOTE — Progress Notes (Signed)
Temp 101.1, MD notified , orders received

## 2014-03-29 LAB — BASIC METABOLIC PANEL
ANION GAP: 10 (ref 5–15)
BUN: 12 mg/dL (ref 6–23)
CALCIUM: 8.8 mg/dL (ref 8.4–10.5)
CHLORIDE: 90 mmol/L — AB (ref 96–112)
CO2: 39 mmol/L — AB (ref 19–32)
Creatinine, Ser: 0.64 mg/dL (ref 0.50–1.35)
GFR calc Af Amer: >90 mL/min (ref >90)
GFR calc non Af Amer: >90 mL/min (ref >90)
Glucose, Bld: 82 mg/dL (ref 70–99)
Potassium: 3 mmol/L — ABNORMAL LOW (ref 3.5–5.1)
Sodium: 139 mmol/L (ref 135–145)

## 2014-03-29 LAB — CBC WITH DIFFERENTIAL/PLATELET
BASOS ABS: 0 10*3/uL (ref 0.0–0.1)
Basophils Relative: 0 % (ref 0–1)
EOS ABS: 0.2 10*3/uL (ref 0.0–0.7)
Eosinophils Relative: 2 % (ref 0–5)
HEMATOCRIT: 37.5 % — AB (ref 39.0–52.0)
HEMOGLOBIN: 10.2 g/dL — AB (ref 13.0–17.0)
LYMPHS PCT: 18 % (ref 12–46)
Lymphs Abs: 1.8 10*3/uL (ref 0.7–4.0)
MCH: 19.6 pg — AB (ref 26.0–34.0)
MCHC: 27.2 g/dL — ABNORMAL LOW (ref 30.0–36.0)
MCV: 72 fL — AB (ref 78.0–100.0)
MONO ABS: 1 10*3/uL (ref 0.1–1.0)
MONOS PCT: 10 % (ref 3–12)
NEUTROS ABS: 6.8 10*3/uL (ref 1.7–7.7)
Neutrophils Relative %: 70 % (ref 43–77)
Platelets: 240 10*3/uL (ref 150–400)
RBC: 5.21 MIL/uL (ref 4.22–5.81)
RDW: 20.5 % — ABNORMAL HIGH (ref 11.5–15.5)
WBC: 9.8 10*3/uL (ref 4.0–10.5)

## 2014-03-29 LAB — GLUCOSE, CAPILLARY
GLUCOSE-CAPILLARY: 84 mg/dL (ref 70–99)
GLUCOSE-CAPILLARY: 125 mg/dL — AB (ref 70–99)
Glucose-Capillary: 96 mg/dL (ref 70–99)
Glucose-Capillary: 131 mg/dL — ABNORMAL HIGH (ref 70–99)
Glucose-Capillary: 147 mg/dL — ABNORMAL HIGH (ref 70–99)

## 2014-03-29 LAB — URINE CULTURE
CULTURE: NO GROWTH
Colony Count: NO GROWTH
Colony Count: NO GROWTH
Culture: NO GROWTH

## 2014-03-29 LAB — PHOSPHORUS: Phosphorus: 4.9 mg/dL — ABNORMAL HIGH (ref 2.3–4.6)

## 2014-03-29 LAB — MAGNESIUM: MAGNESIUM: 1.8 mg/dL (ref 1.5–2.5)

## 2014-03-29 MED ORDER — DOCUSATE SODIUM 100 MG PO CAPS
100.0000 mg | ORAL_CAPSULE | Freq: Two times a day (BID) | ORAL | Status: DC
  Administered 2014-03-29 – 2014-03-30 (×3): 100 mg via ORAL
  Filled 2014-03-29 (×3): qty 1

## 2014-03-29 MED ORDER — POTASSIUM CHLORIDE CRYS ER 10 MEQ PO TBCR
30.0000 meq | EXTENDED_RELEASE_TABLET | ORAL | Status: AC
  Administered 2014-03-29 (×2): 30 meq via ORAL
  Filled 2014-03-29 (×4): qty 1

## 2014-03-29 MED ORDER — FUROSEMIDE 40 MG PO TABS: 40.0000 mg | ORAL_TABLET | Freq: Once | ORAL | Status: DC

## 2014-03-29 MED ORDER — OXYCODONE-ACETAMINOPHEN 5-325 MG PO TABS
1.0000 | ORAL_TABLET | Freq: Four times a day (QID) | ORAL | Status: DC | PRN
  Administered 2014-03-29 – 2014-03-30 (×5): 2 via ORAL
  Filled 2014-03-29 (×5): qty 2

## 2014-03-29 MED ORDER — HYDROMORPHONE HCL 1 MG/ML IJ SOLN: 0.5000 mg | INTRAMUSCULAR | Status: DC | PRN

## 2014-03-29 MED ORDER — IPRATROPIUM-ALBUTEROL 0.5-2.5 (3) MG/3ML IN SOLN: 3.0000 mL | RESPIRATORY_TRACT | Status: DC | PRN

## 2014-03-29 MED ORDER — INSULIN ASPART 100 UNIT/ML ~~LOC~~ SOLN: 0.0000 [IU] | Freq: Every day | SUBCUTANEOUS | Status: DC

## 2014-03-29 MED ORDER — CETYLPYRIDINIUM CHLORIDE 0.05 % MT LIQD
7.0000 mL | Freq: Two times a day (BID) | OROMUCOSAL | Status: DC
  Administered 2014-03-29: 7 mL via OROMUCOSAL

## 2014-03-29 MED ORDER — INSULIN ASPART 100 UNIT/ML ~~LOC~~ SOLN
0.0000 [IU] | Freq: Three times a day (TID) | SUBCUTANEOUS | Status: DC
  Administered 2014-03-29 – 2014-03-30 (×3): 3 [IU] via SUBCUTANEOUS

## 2014-03-29 NOTE — Progress Notes (Signed)
Pt transferred via wheelchair with all belongings and his family to [id number].  Pt placed in bed with call bell.  Receiving RN aware of pt arrival.  Vista Lawman, RN

## 2014-03-29 NOTE — Progress Notes (Signed)
Report called to receiving RN on 5N.
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
## 2014-03-29 NOTE — Clinical Social Work Note (Signed)
CSW received referral for SNF.  Pt does not recommend any follow up from [place].  CSW to sign off please re-consult if social work needs arise.  Jones Broom. [place], MSW, [place]

## 2014-03-29 NOTE — Progress Notes (Signed)
[facility] ADULT ICU REPLACEMENT PROTOCOL FOR AM LAB REPLACEMENT ONLY  The patient does apply for the [facility] Adult ICU Electrolyte Replacment Protocol based on the criteria listed below:   1. Is GFR >/= 40 ml/min? Yes.    Patient's GFR today is >90 2. Is urine output >/= 0.5 ml/kg/hr for the last 6 hours? Yes.   Patient's UOP is 1.6 ml/kg/hr 3. Is BUN < 60 mg/dL? Yes.    Patient's BUN today is 12 4. Abnormal electrolyte(s): Potassium 5. Ordered repletion with: Potassium per Protocol   [name] P 03/29/2014 4:06 AM
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
## 2014-03-30 DIAGNOSIS — J9620 Acute and chronic respiratory failure, unspecified whether with hypoxia or hypercapnia: Secondary | ICD-10-CM

## 2014-03-30 LAB — BASIC METABOLIC PANEL
ANION GAP: 5 (ref 5–15)
BUN: 14 mg/dL (ref 6–23)
CO2: 42 mmol/L (ref 19–32)
CREATININE: 0.82 mg/dL (ref 0.50–1.35)
Calcium: 8.9 mg/dL (ref 8.4–10.5)
Chloride: 89 mmol/L — ABNORMAL LOW (ref 96–112)
GFR calc Af Amer: >90 mL/min (ref >90)
Glucose, Bld: 123 mg/dL — ABNORMAL HIGH (ref 70–99)
Potassium: 3.4 mmol/L — ABNORMAL LOW (ref 3.5–5.1)
Sodium: 136 mmol/L (ref 135–145)

## 2014-03-30 LAB — GLUCOSE, CAPILLARY
Glucose-Capillary: 136 mg/dL — ABNORMAL HIGH (ref 70–99)
Glucose-Capillary: 111 mg/dL — ABNORMAL HIGH (ref 70–99)
Glucose-Capillary: 129 mg/dL — ABNORMAL HIGH (ref 70–99)

## 2014-03-30 MED ORDER — OXYCODONE HCL 10 MG PO TABS: 10.0000 mg | ORAL_TABLET | ORAL | Status: DC | PRN

## 2014-03-30 NOTE — Progress Notes (Signed)
Pt has a critical lab this morning, blood CO2 is 42. Dr. Halford Chessman notified. MD is aware on this lab.
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
## 2014-03-30 NOTE — Progress Notes (Signed)
SATURATION QUALIFICATIONS: (This note is used to comply with regulatory documentation for home oxygen)  Patient Saturations on Room Air at Rest = 85%  Patient Saturations on Room Air while Ambulating = 83%  Patient Saturations on 3 Liters of oxygen while Ambulating = 94%  Please briefly explain why patient needs home oxygen: I tried to wine off oxygen for pt. Patient desaturated to 80's without activity. Patient also need Bi pap to go home when he is medically ready.

## 2014-03-30 NOTE — Care Management Note (Signed)
    Page 1 of 2   03/30/2014     2:37:39 PM CARE MANAGEMENT NOTE 03/30/2014  Patient:  Dean Bowers, Dean Bowers   Account Number:  [id number]  Date Initiated:  03/29/2014  Documentation initiated by:  MAYO,HENRIETTA  Subjective/Objective Assessment:   dx resp failure s/p revision (R) BKA; lives with family    PCP  Jill Alexanders     Action/Plan:   Will follow for discharge needs.   Anticipated DC Date:  03/30/2014   Anticipated DC Plan:  [place]  CM consult      Choice offered to / List presented to:     DME arranged  BIPAP  OXYGEN      DME agency  [facility]        Status of service:  In process, will continue to follow Medicare Important Message given?   (If response is "NO", the following Medicare IM given date fields will be blank) Date Medicare IM given:   Medicare IM given by:   Date Additional Medicare IM given:   Additional Medicare IM given by:    Discharge Disposition:  HOME/SELF CARE  Per UR Regulation:  Reviewed for med. necessity/level of care/duration of stay  If discussed at [place] of Stay Meetings, dates discussed:    Comments:  03/30/14 [place], MSN, CM- Home oxygen orders received. [facility] notiified of planned discharge home today. Bedside RN updated. Patient has a previously scheduled sleep study on 04/23/14  03/30/14 [place], MSN, CM- [facility] D[facility] notified of home bipap order and settings.  CM spoke with bedside RN to notify that patient's O2 saturations need to be checked and charted to qualify him for home 02.  [facility] DME awaiting results and home oxygen orders.    03/29/14 [place] MSN BSN CCM PT recommending no f/u as pt's main limitation is BiPAP. Spouse present in room and states she will be able to provide 24/7 assistance when pt medically stable for discharge.

## 2014-03-31 LAB — CULTURE, RESPIRATORY W GRAM STAIN: Culture: NORMAL

## 2014-03-31 LAB — CULTURE, RESPIRATORY: GRAM STAIN: NONE SEEN

## 2014-04-03 LAB — CULTURE, BLOOD (ROUTINE X 2)
CULTURE: NO GROWTH
Culture: NO GROWTH

## 2014-04-07 ENCOUNTER — Encounter: Payer: Self-pay | Admitting: Family Medicine

## 2014-04-08 ENCOUNTER — Encounter: Payer: Self-pay | Admitting: Internal Medicine

## 2014-04-08 ENCOUNTER — Encounter: Payer: Self-pay | Admitting: Family Medicine

## 2014-04-09 ENCOUNTER — Telehealth: Payer: Self-pay | Admitting: Family Medicine

## 2014-04-09 MED ORDER — OXYCODONE HCL 10 MG PO TABS: 10.0000 mg | ORAL_TABLET | ORAL | Status: DC | PRN

## 2014-04-09 NOTE — Telephone Encounter (Signed)
Pt's wife Constance Holster called for refills. Pt needs oxycodone. Please call [phone] when ready.

## 2014-04-12 ENCOUNTER — Telehealth: Payer: Self-pay | Admitting: Family Medicine

## 2014-04-12 NOTE — Telephone Encounter (Signed)
Tina with [employer] asked how much the knee walker is and Noram Sindelar advised $400.00.  Otila Kluver advised it is approved and does not need a prior authorization.  Tina aware.

## 2014-04-12 NOTE — Telephone Encounter (Signed)
Check the state drug registry on this one for me please

## 2014-04-12 NOTE — Telephone Encounter (Signed)
Dean Bowers called and states that pt normally gets #90 of Oxycodone as insurance only pays 1 time monthly for this and pt has been reassessed and takes them more often.  Pt still has script and can bring in the morning.  Please advise pt [phone]

## 2014-04-13 ENCOUNTER — Other Ambulatory Visit (HOSPITAL_COMMUNITY): Payer: Self-pay | Admitting: Orthopedic Surgery

## 2014-04-13 ENCOUNTER — Encounter: Payer: Self-pay | Admitting: Family Medicine

## 2014-04-13 ENCOUNTER — Ambulatory Visit (INDEPENDENT_AMBULATORY_CARE_PROVIDER_SITE_OTHER): Payer: PRIVATE HEALTH INSURANCE | Admitting: Family Medicine

## 2014-04-13 ENCOUNTER — Encounter (HOSPITAL_COMMUNITY): Payer: Self-pay | Admitting: *Deleted

## 2014-04-13 VITALS — BP 150/94 | HR 119

## 2014-04-13 DIAGNOSIS — R609 Edema, unspecified: Secondary | ICD-10-CM

## 2014-04-13 DIAGNOSIS — L0291 Cutaneous abscess, unspecified: Secondary | ICD-10-CM

## 2014-04-13 DIAGNOSIS — J9621 Acute and chronic respiratory failure with hypoxia: Secondary | ICD-10-CM

## 2014-04-13 DIAGNOSIS — R531 Weakness: Secondary | ICD-10-CM | POA: Diagnosis not present

## 2014-04-13 DIAGNOSIS — J9601 Acute respiratory failure with hypoxia: Secondary | ICD-10-CM | POA: Diagnosis not present

## 2014-04-13 DIAGNOSIS — T8740 Infection of amputation stump, unspecified extremity: Secondary | ICD-10-CM

## 2014-04-13 LAB — COMPREHENSIVE METABOLIC PANEL
ALK PHOS: 69 U/L (ref 39–117)
ALT: 28 U/L (ref 0–53)
AST: 25 U/L (ref 0–37)
Albumin: 3.3 g/dL — ABNORMAL LOW (ref 3.5–5.2)
BUN: 15 mg/dL (ref 6–23)
CO2: 34 mEq/L — ABNORMAL HIGH (ref 19–32)
Calcium: 8.5 mg/dL (ref 8.4–10.5)
Chloride: 96 mEq/L (ref 96–112)
Creat: 0.78 mg/dL (ref 0.50–1.35)
GLUCOSE: 159 mg/dL — AB (ref 70–99)
Potassium: 3.7 mEq/L (ref 3.5–5.3)
Sodium: 139 mEq/L (ref 135–145)
TOTAL PROTEIN: 7.1 g/dL (ref 6.0–8.3)
Total Bilirubin: 0.5 mg/dL (ref 0.2–1.2)

## 2014-04-13 MED ORDER — OXYCODONE-ACETAMINOPHEN 10-325 MG PO TABS: 1.0000 | ORAL_TABLET | Freq: Three times a day (TID) | ORAL | Status: DC | PRN

## 2014-04-13 MED ORDER — FUROSEMIDE 40 MG PO TABS: 40.0000 mg | ORAL_TABLET | Freq: Two times a day (BID) | ORAL | Status: DC

## 2014-04-13 NOTE — Progress Notes (Signed)
Pt was sent home on bipap but he is unable to use it due to the mask not fitting. Uses 3L of O2 at night. Has sleep study scheduled in the near future.

## 2014-04-13 NOTE — Progress Notes (Signed)
   Subjective:    Patient ID: Dean Bowers, male    DOB: 1970-02-18, 44 y.o.   MRN: [id number]  HPI He is here for follow-up visit after recent hospitalization. He did have an abscess and infection in the right stump with revision. He went into acute respiratory failure and was admitted to the ICU. Those notes were reviewed. Since leaving the hospital he has continued to have increasing difficulty with swallowing and now complains of swelling to the mid abdominal area. He continues on oxygen. In the hospital he was placed on BiPAP but has not tolerated it. He is trying use it at home and having difficulty with it. He is scheduled for a sleep evaluation shortly. He did have an echocardiogram done while in the hospital which showed no evidence of right heart issues. Presently he is on 20 twice a day of Lasix. He is using pain medications due to the stump revision and abscess. He would like a refill on this. He does complain mainly of weakness.   Review of Systems     Objective:   Physical Exam Alert and slightly tachypnea with a pulse ox of 92. Abdominal exam does show skin pitting edema from the umbilicus distally. Stump was not evaluated. Cardiac exam does show a tachycardia.       Assessment & Plan:  Obesity, Class III, BMI 40-49.9 (morbid obesity)  Acute on chronic respiratory failure with hypoxia - Plan: CANCELED: 2D Echocardiogram without contrast  Weakness  Pitting edema - Plan: furosemide (LASIX) 40 MG tablet, CBC with Differential/Platelet, Comprehensive metabolic panel  Infection of below knee amputation stump  Acute respiratory failure with hypoxemia  Abscess  I will increase his Lasix to 40 mg. He is scheduled for the sleep study. He will follow-up with his orthopedic surgeon later today. Blood work was drawn. No good reason for the swelling other than dietary indiscretion as I do not think this is right heart failure although that is certainly a possibility but with a  negative echo very unlikely.

## 2014-04-14 ENCOUNTER — Ambulatory Visit (HOSPITAL_COMMUNITY)
Admission: RE | Admit: 2014-04-14 | Discharge: 2014-04-16 | Disposition: A | Discharge status: Home / Self Care | Payer: 59 | Source: Ambulatory Visit | Attending: Internal Medicine | Admitting: Internal Medicine

## 2014-04-14 ENCOUNTER — Encounter (HOSPITAL_COMMUNITY): Payer: Self-pay | Admitting: *Deleted

## 2014-04-14 ENCOUNTER — Ambulatory Visit (HOSPITAL_COMMUNITY): Payer: 59 | Admitting: Anesthesiology

## 2014-04-14 ENCOUNTER — Encounter (HOSPITAL_COMMUNITY)
Admission: RE | Disposition: A | Discharge status: Home / Self Care | Payer: Self-pay | Source: Ambulatory Visit | Attending: Orthopedic Surgery

## 2014-04-14 DIAGNOSIS — T8781 Dehiscence of amputation stump: Secondary | ICD-10-CM | POA: Diagnosis present

## 2014-04-14 DIAGNOSIS — E119 Type 2 diabetes mellitus without complications: Secondary | ICD-10-CM | POA: Diagnosis not present

## 2014-04-14 DIAGNOSIS — E876 Hypokalemia: Secondary | ICD-10-CM | POA: Insufficient documentation

## 2014-04-14 DIAGNOSIS — Z6841 Body Mass Index (BMI) 40.0 and over, adult: Secondary | ICD-10-CM | POA: Insufficient documentation

## 2014-04-14 DIAGNOSIS — J9601 Acute respiratory failure with hypoxia: Secondary | ICD-10-CM | POA: Insufficient documentation

## 2014-04-14 DIAGNOSIS — Z89511 Acquired absence of right leg below knee: Secondary | ICD-10-CM | POA: Diagnosis not present

## 2014-04-14 DIAGNOSIS — I10 Essential (primary) hypertension: Secondary | ICD-10-CM | POA: Diagnosis not present

## 2014-04-14 DIAGNOSIS — J449 Chronic obstructive pulmonary disease, unspecified: Secondary | ICD-10-CM | POA: Insufficient documentation

## 2014-04-14 DIAGNOSIS — Y835 Amputation of limb(s) as the cause of abnormal reaction of the patient, or of later complication, without mention of misadventure at the time of the procedure: Secondary | ICD-10-CM | POA: Diagnosis not present

## 2014-04-14 DIAGNOSIS — T879 Unspecified complications of amputation stump: Secondary | ICD-10-CM | POA: Diagnosis present

## 2014-04-14 DIAGNOSIS — R0902 Hypoxemia: Secondary | ICD-10-CM

## 2014-04-14 DIAGNOSIS — E785 Hyperlipidemia, unspecified: Secondary | ICD-10-CM | POA: Insufficient documentation

## 2014-04-14 HISTORY — PX: STUMP REVISION: SHX6102

## 2014-04-14 LAB — CBC
HEMATOCRIT: 36.5 % — AB (ref 39.0–52.0)
Hemoglobin: 9.9 g/dL — ABNORMAL LOW (ref 13.0–17.0)
MCH: 20 pg — ABNORMAL LOW (ref 26.0–34.0)
MCHC: 27.1 g/dL — ABNORMAL LOW (ref 30.0–36.0)
MCV: 73.6 fL — ABNORMAL LOW (ref 78.0–100.0)
Platelets: 362 10*3/uL (ref 150–400)
RBC: 4.96 MIL/uL (ref 4.22–5.81)
RDW: 21.4 % — ABNORMAL HIGH (ref 11.5–15.5)
WBC: 11 10*3/uL — ABNORMAL HIGH (ref 4.0–10.5)

## 2014-04-14 LAB — COMPREHENSIVE METABOLIC PANEL
ALBUMIN: 2.8 g/dL — AB (ref 3.5–5.2)
ALT: 31 U/L (ref 0–53)
AST: 36 U/L (ref 0–37)
Alkaline Phosphatase: 72 U/L (ref 39–117)
Anion gap: 11 (ref 5–15)
BILIRUBIN TOTAL: 0.8 mg/dL (ref 0.3–1.2)
BUN: 16 mg/dL (ref 6–23)
CO2: 33 mmol/L — ABNORMAL HIGH (ref 19–32)
Calcium: 8.6 mg/dL (ref 8.4–10.5)
Chloride: 94 mmol/L — ABNORMAL LOW (ref 96–112)
Creatinine, Ser: 0.85 mg/dL (ref 0.50–1.35)
GFR calc Af Amer: >90 mL/min (ref >90)
GFR calc non Af Amer: >90 mL/min (ref >90)
Glucose, Bld: 98 mg/dL (ref 70–99)
Potassium: 3.3 mmol/L — ABNORMAL LOW (ref 3.5–5.1)
Sodium: 138 mmol/L (ref 135–145)
Total Protein: 7.2 g/dL (ref 6.0–8.3)

## 2014-04-14 LAB — GLUCOSE, CAPILLARY
Glucose-Capillary: 105 mg/dL — ABNORMAL HIGH (ref 70–99)
Glucose-Capillary: 138 mg/dL — ABNORMAL HIGH (ref 70–99)

## 2014-04-14 LAB — APTT: aPTT: 39 seconds — ABNORMAL HIGH (ref 24–37)

## 2014-04-14 LAB — CBC WITH DIFFERENTIAL/PLATELET
BASOS ABS: 0 10*3/uL (ref 0.0–0.1)
BASOS PCT: 0 % (ref 0–1)
EOS ABS: 0.1 10*3/uL (ref 0.0–0.7)
EOS PCT: 1 % (ref 0–5)
HCT: 34.4 % — ABNORMAL LOW (ref 39.0–52.0)
Hemoglobin: 9.2 g/dL — ABNORMAL LOW (ref 13.0–17.0)
Lymphocytes Relative: 19 % (ref 12–46)
Lymphs Abs: 2 10*3/uL (ref 0.7–4.0)
MCH: 19.2 pg — ABNORMAL LOW (ref 26.0–34.0)
MCHC: 26.7 g/dL — ABNORMAL LOW (ref 30.0–36.0)
MCV: 71.8 fL — ABNORMAL LOW (ref 78.0–100.0)
MPV: 9 fL (ref 8.6–12.4)
Monocytes Absolute: 0.9 10*3/uL (ref 0.1–1.0)
Monocytes Relative: 8 % (ref 3–12)
Neutro Abs: 7.7 10*3/uL (ref 1.7–7.7)
Neutrophils Relative %: 72 % (ref 43–77)
Platelets: 401 10*3/uL — ABNORMAL HIGH (ref 150–400)
RBC: 4.79 MIL/uL (ref 4.22–5.81)
RDW: 19.9 % — ABNORMAL HIGH (ref 11.5–15.5)
WBC: 10.7 10*3/uL — ABNORMAL HIGH (ref 4.0–10.5)

## 2014-04-14 LAB — PROTIME-INR
INR: 1.2 (ref 0.00–1.49)
Prothrombin Time: 15.3 seconds — ABNORMAL HIGH (ref 11.6–15.2)

## 2014-04-14 SURGERY — REVISION, AMPUTATION SITE
Anesthesia: General | Site: Leg Lower | Laterality: Right

## 2014-04-14 MED ORDER — FENTANYL CITRATE 0.05 MG/ML IJ SOLN
INTRAMUSCULAR | Status: AC
  Administered 2014-04-14: 25 ug via INTRAVENOUS
  Filled 2014-04-14: qty 2

## 2014-04-14 MED ORDER — PROMETHAZINE HCL 25 MG/ML IJ SOLN
6.2500 mg | INTRAMUSCULAR | Status: DC | PRN
Start: 2014-04-14 — End: 2014-04-14

## 2014-04-14 MED ORDER — MEPERIDINE HCL 25 MG/ML IJ SOLN: 6.2500 mg | INTRAMUSCULAR | Status: DC | PRN

## 2014-04-14 MED ORDER — ALBUTEROL SULFATE HFA 108 (90 BASE) MCG/ACT IN AERS
INHALATION_SPRAY | RESPIRATORY_TRACT | Status: AC
  Filled 2014-04-14: qty 6.7

## 2014-04-14 MED ORDER — LACTATED RINGERS IV SOLN
INTRAVENOUS | Status: DC | PRN
  Administered 2014-04-14: 11:00:00 via INTRAVENOUS

## 2014-04-14 MED ORDER — 0.9 % SODIUM CHLORIDE (POUR BTL) OPTIME
TOPICAL | Status: DC | PRN
  Administered 2014-04-14: 1000 mL

## 2014-04-14 MED ORDER — ALPRAZOLAM 0.5 MG PO TABS: 0.5000 mg | ORAL_TABLET | Freq: Three times a day (TID) | ORAL | Status: DC | PRN

## 2014-04-14 MED ORDER — SUCCINYLCHOLINE CHLORIDE 20 MG/ML IJ SOLN
INTRAMUSCULAR | Status: DC | PRN
  Administered 2014-04-14: 200 mg via INTRAVENOUS

## 2014-04-14 MED ORDER — METOCLOPRAMIDE HCL 10 MG PO TABS
5.0000 mg | ORAL_TABLET | Freq: Three times a day (TID) | ORAL | Status: DC | PRN
  Filled 2014-04-14: qty 1

## 2014-04-14 MED ORDER — METOCLOPRAMIDE HCL 5 MG/ML IJ SOLN
5.0000 mg | Freq: Three times a day (TID) | INTRAMUSCULAR | Status: DC | PRN
  Filled 2014-04-14: qty 2

## 2014-04-14 MED ORDER — OXYCODONE-ACETAMINOPHEN 5-325 MG PO TABS
1.0000 | ORAL_TABLET | ORAL | Status: DC | PRN
  Administered 2014-04-14 – 2014-04-15 (×4): 2 via ORAL
  Filled 2014-04-14 (×3): qty 2

## 2014-04-14 MED ORDER — OXYCODONE-ACETAMINOPHEN 5-325 MG PO TABS
ORAL_TABLET | ORAL | Status: AC
  Filled 2014-04-14: qty 2

## 2014-04-14 MED ORDER — FENTANYL CITRATE 0.05 MG/ML IJ SOLN
25.0000 ug | INTRAMUSCULAR | Status: DC | PRN
  Administered 2014-04-14: 50 ug via INTRAVENOUS
  Administered 2014-04-14 (×3): 25 ug via INTRAVENOUS
  Administered 2014-04-14: 50 ug via INTRAVENOUS

## 2014-04-14 MED ORDER — GABAPENTIN 600 MG PO TABS
600.0000 mg | ORAL_TABLET | Freq: Two times a day (BID) | ORAL | Status: DC
  Administered 2014-04-14 – 2014-04-16 (×4): 600 mg via ORAL
  Filled 2014-04-14 (×5): qty 1

## 2014-04-14 MED ORDER — ONDANSETRON HCL 4 MG PO TABS: 4.0000 mg | ORAL_TABLET | Freq: Four times a day (QID) | ORAL | Status: DC | PRN

## 2014-04-14 MED ORDER — LACTATED RINGERS IV SOLN
INTRAVENOUS | Status: DC
  Administered 2014-04-14: 10:00:00 via INTRAVENOUS

## 2014-04-14 MED ORDER — HYDROMORPHONE HCL 1 MG/ML IJ SOLN
0.5000 mg | INTRAMUSCULAR | Status: DC | PRN
  Administered 2014-04-14 – 2014-04-15 (×3): 1 mg via INTRAVENOUS
  Filled 2014-04-14 (×3): qty 1

## 2014-04-14 MED ORDER — ETOMIDATE 2 MG/ML IV SOLN
INTRAVENOUS | Status: DC | PRN
  Administered 2014-04-14: 20 mg via INTRAVENOUS

## 2014-04-14 MED ORDER — DILTIAZEM HCL ER COATED BEADS 120 MG PO CP24
120.0000 mg | ORAL_CAPSULE | Freq: Every day | ORAL | Status: DC
  Administered 2014-04-15 – 2014-04-16 (×2): 120 mg via ORAL
  Filled 2014-04-14 (×2): qty 1

## 2014-04-14 MED ORDER — PROPOFOL 10 MG/ML IV BOLUS
INTRAVENOUS | Status: AC
  Filled 2014-04-14: qty 20

## 2014-04-14 MED ORDER — MIDAZOLAM HCL 2 MG/2ML IJ SOLN
INTRAMUSCULAR | Status: AC
  Filled 2014-04-14: qty 2

## 2014-04-14 MED ORDER — FENTANYL CITRATE 0.05 MG/ML IJ SOLN
INTRAMUSCULAR | Status: AC
  Filled 2014-04-14: qty 5

## 2014-04-14 MED ORDER — ASPIRIN EC 81 MG PO TBEC
81.0000 mg | DELAYED_RELEASE_TABLET | Freq: Every day | ORAL | Status: DC
  Administered 2014-04-15 – 2014-04-16 (×2): 81 mg via ORAL
  Filled 2014-04-14 (×2): qty 1

## 2014-04-14 MED ORDER — ONDANSETRON HCL 4 MG/2ML IJ SOLN: 4.0000 mg | Freq: Four times a day (QID) | INTRAMUSCULAR | Status: DC | PRN

## 2014-04-14 MED ORDER — DEXTROSE 5 % IV SOLN: 500.0000 mg | Freq: Four times a day (QID) | INTRAVENOUS | Status: DC | PRN

## 2014-04-14 MED ORDER — SODIUM CHLORIDE 0.9 % IV SOLN
INTRAVENOUS | Status: DC
  Administered 2014-04-14: 17:00:00 via INTRAVENOUS

## 2014-04-14 MED ORDER — CEFAZOLIN SODIUM-DEXTROSE 2-3 GM-% IV SOLR
2.0000 g | Freq: Four times a day (QID) | INTRAVENOUS | Status: AC
Start: 2014-04-14 — End: 2014-04-15
  Administered 2014-04-14 – 2014-04-15 (×3): 2 g via INTRAVENOUS
  Filled 2014-04-14 (×3): qty 50

## 2014-04-14 MED ORDER — MIDAZOLAM HCL 2 MG/2ML IJ SOLN: 0.5000 mg | Freq: Once | INTRAMUSCULAR | Status: DC | PRN

## 2014-04-14 MED ORDER — LIDOCAINE HCL (CARDIAC) 20 MG/ML IV SOLN
INTRAVENOUS | Status: AC
  Filled 2014-04-14: qty 5

## 2014-04-14 MED ORDER — PROPOFOL 10 MG/ML IV BOLUS
INTRAVENOUS | Status: DC | PRN
  Administered 2014-04-14: 50 mg via INTRAVENOUS

## 2014-04-14 MED ORDER — METHOCARBAMOL 500 MG PO TABS
ORAL_TABLET | ORAL | Status: AC
  Filled 2014-04-14: qty 1

## 2014-04-14 MED ORDER — ALBUTEROL SULFATE (2.5 MG/3ML) 0.083% IN NEBU: 3.0000 mL | INHALATION_SOLUTION | RESPIRATORY_TRACT | Status: DC | PRN

## 2014-04-14 MED ORDER — CEFAZOLIN SODIUM 10 G IJ SOLR
3.0000 g | INTRAMUSCULAR | Status: DC
  Filled 2014-04-14: qty 3000

## 2014-04-14 MED ORDER — FUROSEMIDE 40 MG PO TABS
40.0000 mg | ORAL_TABLET | Freq: Two times a day (BID) | ORAL | Status: DC
  Administered 2014-04-14 – 2014-04-16 (×4): 40 mg via ORAL
  Filled 2014-04-14 (×7): qty 1

## 2014-04-14 MED ORDER — ACETAMINOPHEN 10 MG/ML IV SOLN
1000.0000 mg | INTRAVENOUS | Status: AC
  Administered 2014-04-14: 1000 mg via INTRAVENOUS

## 2014-04-14 MED ORDER — DOCUSATE SODIUM 100 MG PO CAPS
100.0000 mg | ORAL_CAPSULE | Freq: Two times a day (BID) | ORAL | Status: DC
  Administered 2014-04-14 – 2014-04-16 (×4): 100 mg via ORAL
  Filled 2014-04-14 (×5): qty 1

## 2014-04-14 MED ORDER — METHOCARBAMOL 500 MG PO TABS
500.0000 mg | ORAL_TABLET | Freq: Four times a day (QID) | ORAL | Status: DC | PRN
Start: 2014-04-14 — End: 2014-04-16
  Administered 2014-04-14: 500 mg via ORAL
  Filled 2014-04-14: qty 1

## 2014-04-14 SURGICAL SUPPLY — 52 items
BANDAGE ESMARK 6X9 LF (GAUZE/BANDAGES/DRESSINGS) ×1 IMPLANT
BANDAGE GAUZE 4  KLING STR (GAUZE/BANDAGES/DRESSINGS) ×2 IMPLANT
BLADE SAW RECIP 87.9 MT (BLADE) IMPLANT
BLADE SURG 21 STRL SS (BLADE) ×2 IMPLANT
BNDG CMPR 9X6 STRL LF SNTH (GAUZE/BANDAGES/DRESSINGS)
BNDG COHESIVE 6X5 TAN STRL LF (GAUZE/BANDAGES/DRESSINGS) ×4 IMPLANT
BNDG ESMARK 6X9 LF (GAUZE/BANDAGES/DRESSINGS)
BNDG GAUZE ELAST 4 BULKY (GAUZE/BANDAGES/DRESSINGS) ×2 IMPLANT
BNDG GAUZE STRTCH 6 (GAUZE/BANDAGES/DRESSINGS) ×2 IMPLANT
COVER SURGICAL LIGHT HANDLE (MISCELLANEOUS) ×3 IMPLANT
CUFF TOURNIQUET SINGLE 34IN LL (TOURNIQUET CUFF) IMPLANT
DRAIN PENROSE 1/2X12 LTX STRL (WOUND CARE) IMPLANT
DRAPE EXTREMITY T 121X128X90 (DRAPE) ×3 IMPLANT
DRAPE PROXIMA HALF (DRAPES) ×6 IMPLANT
DRAPE U-SHAPE 47X51 STRL (DRAPES) ×6 IMPLANT
DRSG ADAPTIC 3X8 NADH LF (GAUZE/BANDAGES/DRESSINGS) ×3 IMPLANT
DRSG PAD ABDOMINAL 8X10 ST (GAUZE/BANDAGES/DRESSINGS) ×2 IMPLANT
DURAPREP 26ML APPLICATOR (WOUND CARE) ×3 IMPLANT
ELECT CAUTERY BLADE 6.4 (BLADE) IMPLANT
ELECT REM PT RETURN 9FT ADLT (ELECTROSURGICAL) ×3
ELECTRODE REM PT RTRN 9FT ADLT (ELECTROSURGICAL) ×1 IMPLANT
EVACUATOR 1/8 PVC DRAIN (DRAIN) IMPLANT
GAUZE SPONGE 4X4 12PLY STRL (GAUZE/BANDAGES/DRESSINGS) ×3 IMPLANT
GLOVE BIOGEL PI IND STRL 9 (GLOVE) ×1 IMPLANT
GLOVE BIOGEL PI INDICATOR 9 (GLOVE) ×2
GLOVE SURG ORTHO 9.0 STRL STRW (GLOVE) ×3 IMPLANT
GOWN STRL REUS W/ TWL XL LVL3 (GOWN DISPOSABLE) ×2 IMPLANT
GOWN STRL REUS W/TWL XL LVL3 (GOWN DISPOSABLE) ×6
KIT BASIN OR (CUSTOM PROCEDURE TRAY) ×3 IMPLANT
KIT ROOM TURNOVER OR (KITS) ×3 IMPLANT
MANIFOLD NEPTUNE II (INSTRUMENTS) ×3 IMPLANT
NS IRRIG 1000ML POUR BTL (IV SOLUTION) ×3 IMPLANT
PACK GENERAL/GYN (CUSTOM PROCEDURE TRAY) ×3 IMPLANT
PAD ARMBOARD 7.5X6 YLW CONV (MISCELLANEOUS) ×6 IMPLANT
PAD CAST 4YDX4 CTTN HI CHSV (CAST SUPPLIES) ×1 IMPLANT
PADDING CAST COTTON 4X4 STRL (CAST SUPPLIES) ×3
PADDING CAST COTTON 6X4 STRL (CAST SUPPLIES) ×3 IMPLANT
SPONGE GAUZE 4X4 12PLY STER LF (GAUZE/BANDAGES/DRESSINGS) ×2 IMPLANT
SPONGE LAP 18X18 X RAY DECT (DISPOSABLE) ×6 IMPLANT
STAPLER VISISTAT 35W (STAPLE) IMPLANT
STOCKINETTE IMPERVIOUS LG (DRAPES) IMPLANT
SUT ETHILON 2 0 PSLX (SUTURE) ×6 IMPLANT
SUT PDS AB 1 CT  36 (SUTURE)
SUT PDS AB 1 CT 36 (SUTURE) IMPLANT
SUT SILK 2 0 (SUTURE) ×3
SUT SILK 2-0 18XBRD TIE 12 (SUTURE) ×1 IMPLANT
SUT VIC AB 1 CTX 36 (SUTURE)
SUT VIC AB 1 CTX36XBRD ANBCTR (SUTURE) IMPLANT
TOWEL OR 17X24 6PK STRL BLUE (TOWEL DISPOSABLE) ×3 IMPLANT
TOWEL OR 17X26 10 PK STRL BLUE (TOWEL DISPOSABLE) ×3 IMPLANT
TUBE ANAEROBIC SPECIMEN COL (MISCELLANEOUS) IMPLANT
WATER STERILE IRR 1000ML POUR (IV SOLUTION) ×3 IMPLANT
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
## 2014-04-14 NOTE — Op Note (Signed)
04/14/2014  11:52 AM  PATIENT:  Dean Bowers    PRE-OPERATIVE DIAGNOSIS:  Dehiscence Right Below Knee Amputation  POST-OPERATIVE DIAGNOSIS:  Same  PROCEDURE:  Right Below Knee Amputation Revision  SURGEON:  Newt Minion, MD  PHYSICIAN ASSISTANT:None ANESTHESIA:   General  PREOPERATIVE INDICATIONS:  Dean Bowers is a  44 y.o. male with a diagnosis of Dehiscence Right Below Knee Amputation who failed conservative measures and elected for surgical management.    The risks benefits and alternatives were discussed with the patient preoperatively including but not limited to the risks of infection, bleeding, nerve injury, cardiopulmonary complications, the need for revision surgery, among others, and the patient was willing to proceed.  OPERATIVE IMPLANTS: None  OPERATIVE FINDINGS: No abscess no ischemic muscle  OPERATIVE PROCEDURE: Patient was brought to the operating room and underwent a general anesthetic. After adequate levels of anesthesia were obtained patient's right lower extremity was prepped using DuraPrep draped into a sterile field a timeout was called. A fishmouth incision was made around the ulcerative tissue. This was carried sharply down to bone. The distal centimeter of the tibia and fibula were resected. Electrocautery was used for hemostasis. The wound was irrigated. There was   no ischemic muscle there was significant amount of edema with clear drainage. The incision was closed using 2-0 nylon and staples. A sterile compressive dressing was applied. Patient was extubated taken to the PACU in stable condition.

## 2014-04-14 NOTE — Transfer of Care (Signed)
Immediate Anesthesia Transfer of Care Note  Patient: Dean Bowers  Procedure(s) Performed: Procedure(s): Right Below Knee Amputation Revision (Right)  Patient Location: PACU  Anesthesia Type:General  Level of Consciousness: awake, alert , oriented and patient cooperative  Airway & Oxygen Therapy: Patient Spontanous Breathing and Patient connected to face mask oxygen  Post-op Assessment: Report given to RN, Post -op Vital signs reviewed and stable and Patient moving all extremities  Post vital signs: Reviewed and stable  Last Vitals:  Filed Vitals:   04/14/14 1219  BP:   Pulse: 98  Temp: 36.4 C  Resp: 14    Complications: No apparent anesthesia complications

## 2014-04-14 NOTE — Anesthesia Postprocedure Evaluation (Signed)
  Anesthesia Post-op Note  Patient: Dean Bowers  Procedure(s) Performed: Procedure(s): Right Below Knee Amputation Revision (Right)  Patient Location: PACU  Anesthesia Type:General  Level of Consciousness: awake, alert , oriented and patient cooperative  Airway and Oxygen Therapy: Patient Spontanous Breathing and Patient connected to nasal cannula oxygen  Post-op Pain: mild  Post-op Assessment: Post-op Vital signs reviewed, Patient's Cardiovascular Status Stable, Respiratory Function Stable, Patent Airway, No signs of Nausea or vomiting and Pain level controlled  Post-op Vital Signs: Reviewed and stable  Last Vitals:  Filed Vitals:   04/14/14 1245  BP: 180/78  Pulse: 94  Temp:   Resp: 14    Complications: No apparent anesthesia complications

## 2014-04-14 NOTE — Anesthesia Preprocedure Evaluation (Addendum)
Anesthesia Evaluation  Patient identified by MRN, date of birth, ID band Patient awake    Reviewed: Allergy & Precautions, NPO status , Patient's Chart, lab work & pertinent test results  History of Anesthesia Complications (+) history of anesthetic complications (has required re-intubation postop)  Airway Mallampati: I  TM Distance: >3 FB Neck ROM: Full    Dental  (+) Dental Advisory Given   Pulmonary asthma , sleep apnea, Continuous Positive Airway Pressure Ventilation and Oxygen sleep apnea , COPD COPD inhaler and oxygen dependent, Current Smoker,  H/o respiratory failure 1/16 breath sounds clear to auscultation        Cardiovascular hypertension, Pt. on medications - angina+ Peripheral Vascular Disease Rhythm:Regular Rate:Normal     Neuro/Psych Anxiety Chronic back pain: narcotics    GI/Hepatic GERD- (GERDz/nausea last night)  Medicated,(+)     substance abuse  cocaine use and marijuana use,   Endo/Other  diabetes (glu 98)Morbid obesity  Renal/GU negative Renal ROS     Musculoskeletal   Abdominal (+) + obese,   Peds  Hematology  (+) Blood dyscrasia (Hb 9.2), ,   Anesthesia Other Findings   Reproductive/Obstetrics                            Anesthesia Physical Anesthesia Plan  ASA: IV  Anesthesia Plan:    Post-op Pain Management:    Induction: Intravenous  Airway Management Planned: Oral ETT  Additional Equipment:   Intra-op Plan:   Post-operative Plan: Possible Post-op intubation/ventilation  Informed Consent: I have reviewed the patients History and Physical, chart, labs and discussed the procedure including the risks, benefits and alternatives for the proposed anesthesia with the patient or authorized representative who has indicated his/her understanding and acceptance.   Dental advisory given  Plan Discussed with: CRNA and Surgeon  Anesthesia Plan Comments: (Plan  routine monitors, GETA Patient and family understand risk post op ventilation)        Anesthesia Quick Evaluation

## 2014-04-14 NOTE — Anesthesia Procedure Notes (Signed)
Procedure Name: Intubation Date/Time: 04/14/2014 11:21 AM Performed by: Izora Gala Pre-anesthesia Checklist: Patient identified, Emergency Drugs available, Suction available and Patient being monitored Patient Re-evaluated:Patient Re-evaluated prior to inductionOxygen Delivery Method: Circle system utilized Preoxygenation: Pre-oxygenation with 100% oxygen Intubation Type: IV induction Ventilation: Oral airway inserted - appropriate to patient size Laryngoscope Size: Glidescope Grade View: Grade I Tube type: Oral Tube size: 8.0 mm Number of attempts: 1 Airway Equipment and Method: Stylet Placement Confirmation: ETT inserted through vocal cords under direct vision,  positive ETCO2 and breath sounds checked- equal and bilateral Secured at: 22 cm Tube secured with: Tape Dental Injury: Teeth and Oropharynx as per pre-operative assessment

## 2014-04-15 ENCOUNTER — Encounter (HOSPITAL_COMMUNITY): Payer: Self-pay | Admitting: Orthopedic Surgery

## 2014-04-15 ENCOUNTER — Telehealth: Payer: Self-pay | Admitting: Family Medicine

## 2014-04-15 ENCOUNTER — Ambulatory Visit (HOSPITAL_COMMUNITY): Payer: 59

## 2014-04-15 DIAGNOSIS — T8781 Dehiscence of amputation stump: Secondary | ICD-10-CM | POA: Diagnosis not present

## 2014-04-15 DIAGNOSIS — E876 Hypokalemia: Secondary | ICD-10-CM

## 2014-04-15 DIAGNOSIS — J9601 Acute respiratory failure with hypoxia: Secondary | ICD-10-CM | POA: Diagnosis not present

## 2014-04-15 DIAGNOSIS — T879 Unspecified complications of amputation stump: Secondary | ICD-10-CM

## 2014-04-15 LAB — BLOOD GAS, ARTERIAL
ACID-BASE EXCESS: 9.6 mmol/L — AB (ref 0.0–2.0)
BICARBONATE: 37.2 meq/L — AB (ref 20.0–24.0)
DRAWN BY: 23588
O2 CONTENT: 3 L/min
O2 Saturation: 90.4 %
PH ART: 7.218 — AB (ref 7.350–7.450)
PO2 ART: 69.1 mmHg — AB (ref 80.0–100.0)
Patient temperature: 98.6
TCO2: 40.2 mmol/L (ref 0–100)
pCO2 arterial: 95 mmHg (ref 35.0–45.0)

## 2014-04-15 MED ORDER — FUROSEMIDE 10 MG/ML IJ SOLN
20.0000 mg | Freq: Once | INTRAMUSCULAR | Status: AC
  Administered 2014-04-15: 20 mg via INTRAVENOUS
  Filled 2014-04-15: qty 2

## 2014-04-15 MED ORDER — IPRATROPIUM BROMIDE 0.02 % IN SOLN: 0.5000 mg | Freq: Four times a day (QID) | RESPIRATORY_TRACT | Status: DC | PRN

## 2014-04-15 MED ORDER — LEVALBUTEROL HCL 0.63 MG/3ML IN NEBU
0.6300 mg | INHALATION_SOLUTION | Freq: Four times a day (QID) | RESPIRATORY_TRACT | Status: DC
  Administered 2014-04-15 – 2014-04-16 (×4): 0.63 mg via RESPIRATORY_TRACT
  Filled 2014-04-15 (×11): qty 3

## 2014-04-15 MED ORDER — IPRATROPIUM BROMIDE 0.02 % IN SOLN
0.5000 mg | Freq: Four times a day (QID) | RESPIRATORY_TRACT | Status: DC
Start: 2014-04-15 — End: 2014-04-16
  Administered 2014-04-15 – 2014-04-16 (×4): 0.5 mg via RESPIRATORY_TRACT
  Filled 2014-04-15 (×4): qty 2.5

## 2014-04-15 MED ORDER — POTASSIUM CHLORIDE 10 MEQ/100ML IV SOLN
10.0000 meq | INTRAVENOUS | Status: AC
  Administered 2014-04-15 (×3): 10 meq via INTRAVENOUS
  Filled 2014-04-15 (×5): qty 100

## 2014-04-15 MED ORDER — LEVALBUTEROL HCL 0.63 MG/3ML IN NEBU
0.6300 mg | INHALATION_SOLUTION | Freq: Four times a day (QID) | RESPIRATORY_TRACT | Status: DC | PRN
  Filled 2014-04-15: qty 3

## 2014-04-15 NOTE — Progress Notes (Signed)
Pt transported to 2c17 via bipap. Tolerated well.

## 2014-04-15 NOTE — Significant Event (Signed)
Rapid Response Event Note  Overview: Time Called: 1411 Arrival Time: 1415 Event Type: Respiratory  Initial Focused Assessment:  Called by primary Rn for a "second set of eyes", to assess patient who is lethargic.  Upon my arrival to patients room, Rn and family at bedside.  AS per RN, patient lethargic and has been throughout the day.  Patient in bed on 3 LPM [place], sats 88%, 139/79, HR 115.   Patient arouses to voice but drifts off during conversation.  Patient has soft distended belly.  Patient unable to work with PT today due to lethargy.     Interventions:  ABG done as per protocol, Results called to Dr. Sharol Given.   Event Summary:  RN to call if assistance needed   at      at          [facility], [place]

## 2014-04-15 NOTE — Progress Notes (Signed)
CRITICAL VALUE ALERT  Critical value received:  Ph 7.28, pCO2 95, pO2 69.1, Bicarb 37.2, Base excess 9.6   Date of notification:  04/15/14  Time of notification:  1500  Critical value read back:Yes.    Nurse who received alert:  Renato Shin  MD notified (1st page):  Sharol Given  Time of first page:  1510  MD notified (2nd page):  Time of second page:  Responding MD:  Sharol Given   Time MD responded:  [phone]

## 2014-04-15 NOTE — Progress Notes (Signed)
Oxygen was removed and pt finally woke up around 11 pm. Pt is more alert and Oxy saturation ranges from 72-85%.

## 2014-04-15 NOTE — Telephone Encounter (Signed)
Spoke with Constance Holster, pt finally woke up @ 11am this morning, they took his oxgyen off and its at 70% and will not put it back on, they will not get blood or a blood gas on him and wanting to send him home and he is retaining fluid. Per Dr. Redmond School, I advised norma go to the nurse and A Doctor to assess him and his needs

## 2014-04-15 NOTE — Telephone Encounter (Signed)
Wife,Norma, requesting to speak to Dr Redmond School regarding pt. Pt is in hospital because he had surgery due to his incision coming open again. Pt was supposed to be released to go home today but doctors will not release him because pt will not wake up and he is retaining fluid and not urinating much. Constance Holster says this is all normal for the pt and Dr Redmond School is aware of this. Pt has been hard to wake up since January and was retaining fluid when he was here at his lat appointment. Pt has been put on double his Lasix dosage and fluid issue has not changed much. Constance Holster wants to speak to Dr Redmond School to see if he can give insight on pt's normal health to hospital staff so that he may be released sooner.

## 2014-04-15 NOTE — Telephone Encounter (Signed)
Check with them and see how he is doing today

## 2014-04-15 NOTE — Progress Notes (Signed)
Pt just came to floor from [place] [facility]. Pt is lethargic and was brought on Bipap with respiratory and nurse. Pt awakens with sternal chest rub and follows commands.  Central telemetry aware pt in on this floor. Will monitor. Pt given a CHG bath and is resting.

## 2014-04-15 NOTE — Discharge Summary (Signed)
  Discharge to home in stable condition. Final diagnosis dehiscence right transtibial amputation. Surgical procedure revision transtibial amputation. Patient's hospital course was essentially unremarkable and he was discharged to home in stable condition. Follow-up in the office in 1 week.

## 2014-04-15 NOTE — Progress Notes (Signed)
Pt is drowsy and very sleepy. Hard to arouse this morning. Oxygen Saturation between 81-91%. Pt sounds very congested. No narcotics given on my shift. Called MD to let him know of pt status. MD states that this is pt baseline and no need for any blood gas labs.  Pt to be discharged today.

## 2014-04-15 NOTE — Progress Notes (Signed)
Called MD wit the critical lab values for the blood gas. MD stated that he would consult another MD to take one case. Pt lethargic back on O2 3L, oxygen saturation 88-91. Pt sleeping.
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
## 2014-04-15 NOTE — Progress Notes (Signed)
Patient lethargic and diaphoretic responsive to sternal rub. Hospitalist called to alert to change in condition and order entered to move to stepdown and apply bipap. Discharge cancelled. Family in room

## 2014-04-15 NOTE — Progress Notes (Signed)
PT Cancellation Note  Patient Details Name: Dean Bowers MRN: [id number] DOB: [birth year]   Cancelled Treatment:    Reason Eval/Treat Not Completed: Patient's level of consciousness;Fatigue/lethargy limiting ability to participate.will re-attempt this afternoon    Gustavus Bryant [phone] 04/15/2014, 11:20 AM

## 2014-04-15 NOTE — Significant Event (Signed)
Rapid Response Event Note5 on BIPAP  Overview: Time Called: 1615 Arrival Time: 1620 Event Type: Respiratory  Initial Focused Assessment:  Was on way to check on pt and received a call from Elmyra Ricks, Therapist, sports that patient was getting worse, more lethargic, requiring sternal rub to to arouse and was diaphoretic.  Upon my arrival to patients room, family present.  RT at bedside, setting up BIPAP machine.  Placed on BIPAP.     Interventions:  PCXR obtained, sats 95% on biapap 40%, 139/70 HR 107.  Patient tolerating BIPAP   Event Summary:  Patient to transfer to SDU.   at      at          [facility]

## 2014-04-15 NOTE — Evaluation (Signed)
Physical Therapy Evaluation Patient Details Name: MASAJI BILLUPS MRN: [id number] DOB: 02/18/1970 Today's Date: 04/15/2014   History of Present Illness  Pt is a morbidly obese male s/p Rt BKA revision. Pt with PMH of renal failure, COPD, respiratory failure. Pt was adm in january and feburary of this year for respiratory failure.  Clinical Impression  Patient is s/p above surgery resulting in functional limitations due to the deficits listed below (see PT Problem List). Pt was very lethargic this morning and was not able to be aroused for therapy. This afternoon, pt more alert but continued to be lethargic at times sitting EOB. Pt also giving incorrect answers to questions regarding home setup and PLOF with wife present. Pt requiring 2 person mod (A) to stand at EOB and unsafe to assess gt. Pt is at incr risk of falls at this time. Discussed concerns regarding lethargy and confusion with RN. Patient will benefit from skilled PT to increase their independence and safety with mobility to allow discharge to the venue listed below.      Follow Up Recommendations Supervision/Assistance - 24 hour;No PT follow up (pending rehab and cognition improvement)    Equipment Recommendations  None recommended by PT    Recommendations for Other Services OT consult     Precautions / Restrictions Precautions Precautions: Fall Precaution Comments: R BKA Restrictions Weight Bearing Restrictions: Yes RLE Weight Bearing: Non weight bearing      Mobility  Bed Mobility Overal bed mobility: Needs Assistance Bed Mobility: Supine to Sit     Supine to sit: Supervision;HOB elevated     General bed mobility comments: relying heavily on handrails; supervision for safety   Transfers Overall transfer level: Needs assistance Equipment used: Rolling walker (2 wheeled) (bariatric RW) Transfers: Sit to/from Stand Sit to Stand: +2 physical assistance;+2 safety/equipment;Mod assist         General transfer  comment: pt requiring 2 attempts to achieve standing; 2 person mod (A) to elevate to stand and steady RW; use of sheet around waist to serve as a gt belt and (A) with elevating trunk; pt with heavy lean to Lt and fatigued quickly; pt tolerated standing ~1 min   Ambulation/Gait             General Gait Details: unsafe to assess at this time   Stairs            Wheelchair Mobility    Modified Rankin (Stroke Patients Only)       Balance Overall balance assessment: Needs assistance;History of Falls Sitting-balance support: Feet supported;No upper extremity supported Sitting balance-Leahy Scale: Fair     Standing balance support: During functional activity;Bilateral upper extremity supported Standing balance-Leahy Scale: Zero Standing balance comment: RW and 2 person (A) for safety                              Pertinent Vitals/Pain Pain Assessment: No/denies pain    Home Living Family/patient expects to be discharged to:: Private residence Living Arrangements: Spouse/significant other;Children Available Help at Discharge: Family;Available PRN/intermittently Type of Home: House Home Access: Stairs to enter Entrance Stairs-Rails: None Entrance Stairs-Number of Steps: 1 Home Layout: Multi-level Home Equipment: Walker - 2 wheels;Bedside commode;Tub bench;Wheelchair - power;Wheelchair - manual Additional Comments: pt and wife in rental house; was previous able to WB through residual limb for transfers up/down steps; wife works     Prior Function Level of Independence: Independent with assistive device(s)  Comments: per wife and pt; pt was "hopping" up step at a time then would use electric wheelchair for mobility; ambulating household distances with crutches      Hand Dominance   Dominant Hand: Right    Extremity/Trunk Assessment   Upper Extremity Assessment: Defer to OT evaluation           Lower Extremity Assessment: RLE  deficits/detail RLE Deficits / Details: Rt BKA revision     Cervical / Trunk Assessment: Kyphotic  Communication   Communication: No difficulties  Cognition Arousal/Alertness: Lethargic Behavior During Therapy: Flat affect Overall Cognitive Status: Impaired/Different from baseline Area of Impairment: Attention;Memory;Following commands;Safety/judgement;Problem solving;Awareness   Current Attention Level: Sustained Memory: Decreased short-term memory Following Commands: Follows one step commands with increased time Safety/Judgement: Decreased awareness of safety;Decreased awareness of deficits   Problem Solving: Slow processing;Requires verbal cues;Requires tactile cues;Decreased initiation General Comments: per wife pt not at baseline from cognition standpoint; pt lethargic at EOB; requires max cueing and redirection for tasks; pt giving false information during assessment per wife    General Comments General comments (skin integrity, edema, etc.): wife very upset regarding D/C disposition; discussed goals of therapy with wife and pt    Exercises        Assessment/Plan    PT Assessment Patient needs continued PT services  PT Diagnosis Difficulty walking;Acute pain;Generalized weakness;Altered mental status   PT Problem List Decreased strength;Decreased activity tolerance;Decreased balance;Decreased mobility;Decreased safety awareness;Decreased knowledge of precautions;Impaired sensation;Obesity;Pain  PT Treatment Interventions Gait training;DME instruction;Functional mobility training;Therapeutic activities;Patient/family education;Stair training;Therapeutic exercise;Balance training;Neuromuscular re-education;Cognitive remediation;Wheelchair mobility training   PT Goals (Current goals can be found in the Care Plan section) Acute Rehab PT Goals Patient Stated Goal: to get back to where i was and be independent  PT Goal Formulation: With patient/family Time For Goal Achievement:  04/22/14 Potential to Achieve Goals: Good    Frequency Min 3X/week   Barriers to discharge Decreased caregiver support;Inaccessible home environment requiring 2 person (A) at this time; has multiple STE home and navigate through home     Co-evaluation               End of Session Equipment Utilized During Treatment: Gait belt Activity Tolerance: Patient limited by fatigue;Patient limited by lethargy Patient left: in bed;with family/visitor present Nurse Communication: Mobility status;Precautions;Weight bearing status (concerns regarding lethargy)    Functional Assessment Tool Used: clinical judgement  Functional Limitation: Mobility: Walking and moving around Mobility: Walking and Moving Around Current Status (O2423): At least 40 percent but less than 60 percent impaired, limited or restricted Mobility: Walking and Moving Around Goal Status [phone]): At least 1 percent but less than 20 percent impaired, limited or restricted    Time: 4315-4008 PT Time Calculation (min) (ACUTE ONLY): 15 min   Charges:   PT Evaluation $Initial PT Evaluation Tier I: 1 Procedure     PT G Codes:   PT G-Codes **NOT FOR INPATIENT CLASS** Functional Assessment Tool Used: clinical judgement  Functional Limitation: Mobility: Walking and moving around Mobility: Walking and Moving Around Current Status (Q7619): At least 40 percent but less than 60 percent impaired, limited or restricted Mobility: Walking and Moving Around Goal Status [phone]): At least 1 percent but less than 20 percent impaired, limited or restricted    Gustavus Bryant, [place] 04/15/2014, 2:39 PM

## 2014-04-16 DIAGNOSIS — T8781 Dehiscence of amputation stump: Secondary | ICD-10-CM | POA: Diagnosis not present

## 2014-04-16 DIAGNOSIS — R0689 Other abnormalities of breathing: Secondary | ICD-10-CM | POA: Diagnosis not present

## 2014-04-16 LAB — BLOOD GAS, ARTERIAL
ACID-BASE EXCESS: 18.4 mmol/L — AB (ref 0.0–2.0)
Bicarbonate: 44.2 mEq/L — ABNORMAL HIGH (ref 20.0–24.0)
Drawn by: 41308
O2 Content: 3 L/min
O2 Saturation: 96.7 %
PO2 ART: 76.8 mmHg — AB (ref 80.0–100.0)
Patient temperature: 98.6
TCO2: 46.4 mmol/L (ref 0–100)
pCO2 arterial: 70.4 mmHg (ref 35.0–45.0)
pH, Arterial: 7.414 (ref 7.350–7.450)

## 2014-04-16 MED ORDER — POTASSIUM CHLORIDE ER 10 MEQ PO TBCR: 10.0000 meq | EXTENDED_RELEASE_TABLET | Freq: Every day | ORAL | Status: DC

## 2014-04-16 NOTE — Progress Notes (Addendum)
PROGRESS NOTE  Dean Bowers KXF:[id number] DOB: 1970-11-20 DOA: 04/14/2014 PCP: Wyatt Haste, MD  HPI/Recap of past 24 hours: Feeling better, AAOx3, NAD, back to baseline  Assessment/Plan: Active Problems:   Complication of below knee amputation stump  Acute respiratory failure with hypoxia - Unclear etiology possibly OSA with OHS; he is morbidly obese and with limited mobility falls in to higher risk for development of DVT/PE. If hypoxia persists I would definitely obtain CT angio chest to rule out PE - Obtain CXR - Started neb treatment with xopenex and atrovent sch and PRN -- back to baseline, advised patient to use bipap at night and continue loose weight. --patient is stable from respiratory stand point, hospitalists service will sign off, please call if any questions.  Morbid obesity, BMI 56.98 - Nutritionist consulted  -weight loss  Hypokalemia - Due to lasix - supplemented  --prescribed potassium 81meq qd .  Code Status: full  Family Communication: patient and wife  Disposition plan: per ortho    Procedures: Right Below Knee Amputation Revision 3/9  Antibiotics:  perioperative   Objective: BP 123/62 mmHg  Pulse 92  Temp(Src) 98.2 F (36.8 C) (Oral)  Resp 18  Wt 175.088 kg (386 lb)  SpO2 95%  Intake/Output Summary (Last 24 hours) at 04/16/14 0917 Last data filed at 04/16/14 0900  Gross per 24 hour  Intake    680 ml  Output   4925 ml  Net  -4245 ml   Filed Weights   04/14/14 0906  Weight: 175.088 kg (386 lb)    Exam:   General:  AAox3  Cardiovascular: RRR  Respiratory: difficulty to auscultate due to body habitus, but no obvious wheezing/rales/rhonchi. Diminished at bases  Abdomen: obese, NT, positive bowel sounds  Musculoskeletal: R BKA, left foot pitting edema  Neuro: nonfocal  Data Reviewed: Basic Metabolic Panel:  Recent Labs Lab 04/13/14 1425 04/14/14 0928  NA 139 138  K 3.7 3.3*  CL 96 94*  CO2 34* 33*    GLUCOSE 159* 98  BUN 15 16  CREATININE 0.78 0.85  CALCIUM 8.5 8.6   Liver Function Tests:  Recent Labs Lab 04/13/14 1425 04/14/14 0928  AST 25 36  ALT 28 31  ALKPHOS 69 72  BILITOT 0.5 0.8  PROT 7.1 7.2  ALBUMIN 3.3* 2.8*   No results for input(s): LIPASE, AMYLASE in the last 168 hours. No results for input(s): AMMONIA in the last 168 hours. CBC:  Recent Labs Lab 04/13/14 1425 04/14/14 0928  WBC 10.7* 11.0*  NEUTROABS 7.7  --   HGB 9.2* 9.9*  HCT 34.4* 36.5*  MCV 71.8* 73.6*  PLT 401* 362   Cardiac Enzymes:   No results for input(s): CKTOTAL, CKMB, CKMBINDEX, TROPONINI in the last 168 hours. BNP (last 3 results)  Recent Labs  02/07/14 1816 03/28/14 0115  BNP 415.2* 116.1*    ProBNP (last 3 results) No results for input(s): PROBNP in the last 8760 hours.  CBG:  Recent Labs Lab 04/14/14 0917 04/14/14 1612  GLUCAP 105* 138*    No results found for this or any previous visit (from the past 240 hour(s)).   Studies: Dg Chest Port 1 View  04/15/2014   CLINICAL DATA:  Lethargy, diaphoresis, renal failure, COPD, respiratory failure, hypoxia, post BKA, diabetes, hypertension, smoker  EXAM: PORTABLE CHEST - 1 VIEW  COMPARISON:  Portable exam 1631 hours compared to 03/28/2014  FINDINGS: Enlargement of cardiac silhouette with pulmonary vascular congestion.  Improved perihilar opacity since previous exam question  improved edema or infiltrate.  No pleural effusion or pneumothorax.  Bones unremarkable.  IMPRESSION: Enlargement of cardiac silhouette with pulmonary vascular congestion.  Improved pulmonary infiltrates versus 03/28/2014.   Electronically Signed   By: Lavonia Dana M.D.   On: 04/15/2014 17:00    Scheduled Meds: . aspirin EC  81 mg Oral Daily  . diltiazem  120 mg Oral Daily  . docusate sodium  100 mg Oral BID  . furosemide  40 mg Oral BID  . gabapentin  600 mg Oral BID  . ipratropium  0.5 mg Nebulization Q6H  . levalbuterol  0.63 mg Nebulization Q6H     Continuous Infusions:       Latina Frank  [facility] Pager [phone]. If 7PM-7AM, please contact night-coverage at [email or web address], password [facility] 04/16/2014, 9:17 AM

## 2014-04-16 NOTE — Progress Notes (Signed)
Pt is being discharged to home per  md orders. Pt discussed discharge instructions with nurse. PT came and educated pt on crutches and walker and assessed pt and pt received all information he was asking about regarding this area. Respiratory therapy came and discussed pt's use of Bipap at home and answered questions regarding this area. Pt and pt's wife had no further questions for nurse. Pt alert and oriented today x 4. Pt eating without difficulty. Pt in no distress on departure. Iv removed, tip intact. Pt leaving in wheelchair to go home with wife.

## 2014-04-16 NOTE — Progress Notes (Signed)
Patient ID: Dean Bowers, male   DOB: 1970-06-15, 44 y.o.   MRN: [id number] Patient awake oriented, no SOB, off nasal canula FiO2 this AM.  Plan for D/C to home if safe from pulmonary standpoint.  I'll F/U in office next week.  Thank you for Hospitalist consult.

## 2014-04-16 NOTE — Progress Notes (Signed)
RT went to assess patient for midnight rounds and found pt off BIPAP and on 3L [place]. Pt is resting at this time and understands to call if he feels that he is in distress. RT/RN will monitor.

## 2014-04-16 NOTE — Progress Notes (Signed)
Physical Therapy Treatment Patient Details Name: Dean Bowers MRN: [id number] DOB: October 08, 1970 Today's Date: 04/16/2014    History of Present Illness Pt is a morbidly obese male s/p Rt BKA revision. Pt with PMH of renal failure, COPD, respiratory failure. Pt was adm in january and feburary of this year for respiratory failure.    PT Comments    Patient progressing significantly over previous session. Patient able to use RW for functional short distance and transfers to w/c.  Spoke with patient and spouse at length regarding energy conservation and safeyt with mobility. Patient plan for RW initially with w/c as primary source for mobility until energy and balance improvements, then will progress to use of crutches. Patient appears receptive.   Follow Up Recommendations  Supervision/Assistance - 24 hour;No PT follow up     Equipment Recommendations  None recommended by PT    Recommendations for Other Services       Precautions / Restrictions Precautions Precautions: Fall Precaution Comments: R BKA Restrictions Weight Bearing Restrictions: Yes RLE Weight Bearing: Non weight bearing    Mobility  Bed Mobility Overal bed mobility: Needs Assistance Bed Mobility: Supine to Sit     Supine to sit: Supervision;HOB elevated     General bed mobility comments: relying heavily on handrails; supervision for safety   Transfers Overall transfer level: Needs assistance Equipment used: Rolling walker (2 wheeled) (bariatric RW) Transfers: Sit to/from Stand Sit to Stand: Min guard         General transfer comment: Min guard with cues for hand placement upon standing.   Ambulation/Gait Ambulation/Gait assistance: Min guard Ambulation Distance (Feet): 16 Feet Assistive device: Rolling walker (2 wheeled) Gait Pattern/deviations: Step-to pattern Gait velocity: impulsive Gait velocity interpretation: at or above normal speed for age/gender General Gait Details: VCs for slower and  more controlled use of RW, patient very impulsive.    Stairs            Wheelchair Mobility    Modified Rankin (Stroke Patients Only)       Balance   Sitting-balance support: Feet supported Sitting balance-Leahy Scale: Good     Standing balance support: During functional activity Standing balance-Leahy Scale: Fair                      Cognition Arousal/Alertness: Awake/alert Behavior During Therapy: WFL for tasks assessed/performed Overall Cognitive Status: Within Functional Limits for tasks assessed                 General Comments: significantly improved over previous session    Exercises      General Comments General comments (skin integrity, edema, etc.): spoke with patient and wife at length regarding mobility and safety. Educated patient regarding energy conservation in addition to use of assistive device. patient verablizes understanding. Discussed at length safety concerns between RW use and crutches. Answered questions regarding use.        Pertinent Vitals/Pain Pain Assessment: No/denies pain    Home Living                      Prior Function            PT Goals (current goals can now be found in the care plan section) Acute Rehab PT Goals Patient Stated Goal: to get back to where i was and be independent  PT Goal Formulation: With patient/family Time For Goal Achievement: 04/22/14 Potential to Achieve Goals: Good Progress towards PT goals: Progressing toward goals  Frequency  Min 3X/week    PT Plan Current plan remains appropriate    Co-evaluation             End of Session Equipment Utilized During Treatment: Gait belt Activity Tolerance: Patient limited by fatigue;Patient limited by lethargy Patient left: in bed;with family/visitor present     Time: 6599-3570 PT Time Calculation (min) (ACUTE ONLY): 25 min  Charges:  $Gait Training: 8-22 mins $Self Care/Home Management: 8-22                    G  Codes:  Functional Assessment Tool Used: clinical judgement  Functional Limitation: Mobility: Walking and moving around Mobility: Walking and Moving Around Current Status [phone]): At least 1 percent but less than 20 percent impaired, limited or restricted Mobility: Walking and Moving Around Goal Status [phone]): At least 1 percent but less than 20 percent impaired, limited or restricted   Duncan Dull 04/16/2014, 11:21 AM Alben Deeds, PT DPT  [phone]

## 2014-04-20 ENCOUNTER — Inpatient Hospital Stay: Payer: 59 | Admitting: Adult Health

## 2014-04-20 ENCOUNTER — Other Ambulatory Visit: Payer: Self-pay | Admitting: Family Medicine

## 2014-04-20 ENCOUNTER — Telehealth: Payer: Self-pay | Admitting: Internal Medicine

## 2014-04-20 ENCOUNTER — Other Ambulatory Visit: Payer: Self-pay

## 2014-04-20 DIAGNOSIS — J9621 Acute and chronic respiratory failure with hypoxia: Secondary | ICD-10-CM

## 2014-04-20 NOTE — Telephone Encounter (Signed)
Dean Bowers put the order in for me

## 2014-04-20 NOTE — Telephone Encounter (Signed)
Lynnae Sandhoff from [facility] called stating that pt is coming in tonight for a baseline sleep study and wanting to know if he can be switched to a split night study due to his O2 being low in hospital. If this can be switched. Please put new order in to epic. If any questions you can call him @ [phone]

## 2014-04-20 NOTE — Telephone Encounter (Signed)
Go ahead and do this 

## 2014-04-22 ENCOUNTER — Other Ambulatory Visit: Payer: Self-pay

## 2014-04-22 ENCOUNTER — Ambulatory Visit (INDEPENDENT_AMBULATORY_CARE_PROVIDER_SITE_OTHER): Payer: 59 | Admitting: Adult Health

## 2014-04-22 ENCOUNTER — Telehealth: Payer: Self-pay | Admitting: Internal Medicine

## 2014-04-22 ENCOUNTER — Encounter: Payer: Self-pay | Admitting: Adult Health

## 2014-04-22 VITALS — BP 124/66 | HR 98 | Temp 98.6°F | Ht 69.0 in | Wt 390.0 lb

## 2014-04-22 DIAGNOSIS — Z720 Tobacco use: Secondary | ICD-10-CM

## 2014-04-22 DIAGNOSIS — F17200 Nicotine dependence, unspecified, uncomplicated: Secondary | ICD-10-CM

## 2014-04-22 DIAGNOSIS — G4733 Obstructive sleep apnea (adult) (pediatric): Secondary | ICD-10-CM | POA: Diagnosis not present

## 2014-04-22 DIAGNOSIS — J9601 Acute respiratory failure with hypoxia: Secondary | ICD-10-CM | POA: Diagnosis not present

## 2014-04-22 MED ORDER — DILTIAZEM HCL ER COATED BEADS 120 MG PO CP24: 120.0000 mg | ORAL_CAPSULE | Freq: Every day | ORAL | Status: DC

## 2014-04-22 NOTE — Assessment & Plan Note (Signed)
[facility] /[facility]  On nocturnal BIPAP  follow up for sleep study tomorrow as planned.  Wt loss  Download in 1 month

## 2014-04-22 NOTE — Telephone Encounter (Signed)
Dr.Lalonde is this okay patient gets from cardio

## 2014-04-22 NOTE — Telephone Encounter (Signed)
Refill request for diliazem 120mg  to [facility]
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
## 2014-04-22 NOTE — Assessment & Plan Note (Signed)
Follow up for sleep study as planned  Cont on BIPAP

## 2014-04-22 NOTE — Patient Instructions (Signed)
Follow up for sleep study tomorrow as planned .  Quit smoking .  Weight loss.  Continue on Oxygen  Follow up Dr.Sood in 4-6 weeks with download.

## 2014-04-22 NOTE — Assessment & Plan Note (Signed)
Smoking cessation  

## 2014-04-23 ENCOUNTER — Ambulatory Visit (HOSPITAL_BASED_OUTPATIENT_CLINIC_OR_DEPARTMENT_OTHER): Payer: 59 | Attending: Family Medicine

## 2014-04-23 ENCOUNTER — Encounter (HOSPITAL_BASED_OUTPATIENT_CLINIC_OR_DEPARTMENT_OTHER): Payer: 59

## 2014-04-23 VITALS — Ht 68.0 in | Wt >= 6400 oz

## 2014-04-23 DIAGNOSIS — Z6841 Body Mass Index (BMI) 40.0 and over, adult: Secondary | ICD-10-CM | POA: Diagnosis not present

## 2014-04-23 DIAGNOSIS — J9621 Acute and chronic respiratory failure with hypoxia: Secondary | ICD-10-CM | POA: Diagnosis not present

## 2014-04-23 DIAGNOSIS — G4730 Sleep apnea, unspecified: Secondary | ICD-10-CM | POA: Insufficient documentation

## 2014-04-23 NOTE — Progress Notes (Signed)
Reviewed and agree with assessment/plan. 

## 2014-04-24 DIAGNOSIS — G4730 Sleep apnea, unspecified: Secondary | ICD-10-CM | POA: Diagnosis not present

## 2014-04-24 DIAGNOSIS — J9621 Acute and chronic respiratory failure with hypoxia: Secondary | ICD-10-CM | POA: Diagnosis not present

## 2014-04-24 NOTE — Sleep Study (Signed)
   NAME: Dean Bowers DATE OF BIRTH:  06-Oct-1970 MEDICAL RECORD NUMBER [id number]  LOCATION: [facility]  PHYSICIAN: YOUNG,CLINTON D  DATE OF STUDY: 04/23/2014  SLEEP STUDY TYPE: Nocturnal Polysomnogram               REFERRING PHYSICIAN: Denita Lung, MD  INDICATION FOR STUDY: Hypersomnia with sleep apnea  EPWORTH SLEEPINESS SCORE:   20/24 HEIGHT: 5\' 8"  (172.7 cm)  WEIGHT: (!) 400 lb (181.439 kg)    Body mass index is 60.83 kg/(m^2).  NECK SIZE: 20 in.  MEDICATIONS: Charted for review  SLEEP ARCHITECTURE: Split study protocol. During the diagnostic phase, total sleep time 125 minutes with sleep efficiency 77.4%. Stage I was 2%, stage II 98%, stages 3 and REM were absent. Sleep latency 29.5 minutes, awake after sleep onset 7 minutes, arousal index 69.1, bedtime medication Xanax, gabapentin  RESPIRATORY DATA: Apnea hypopnea index (AHI) 139.7 per hour. 291 total events scored including 221 obstructive apneas and 70 hypopneas. All events were while supine. CPAP titration protocol. CPAP was titrated to 15 CWP with inadequate control. He was changed to bilevel (BiPAP) and titrated to inspiratory 25 and expiratory 21, still with residual AHI 130.9 per hour-mixed apneas and hypopneas. This was the highest pressure available.  OXYGEN DATA: Room air oxygen saturation on arrival was 70%. Snoring at the outset was later prevented at highest BiPAP pressures. He wore supplemental oxygen at 3 L throughout the study with oxygen desaturation to a nadir of 75% during the diagnostic phase and mean oxygen saturation of 71.9% on bilevel plus supplemental oxygen during the titration phase.  CARDIAC DATA: Sinus rhythm with PVCs  MOVEMENT/PARASOMNIA: A few incidental limb jerks were noted with little effect on sleep. No bathroom trips  IMPRESSION/ RECOMMENDATION:   1) Severe obstructive sleep apnea/hypopnea syndrome, AHI 139.7 per hour with supine events. Snoring with oxygen  desaturation to a nadir of 75% on 3 L nasal oxygen. 2) Incomplete control. CPAP was titrated to 15 CWP with residual AHI of 135 events per hour. He was then changed to bilevel and titrated to inspiratory 25 inspiratory 21 CWP, AHI 130.9 per hour. With continued supplemental oxygen at 3 L, mean oxygen saturation was 71.9%  3) Inability to control obstructive apnea with these bilevel pressures suggests possible upper airway fixed obstruction, as by tonsil hypertrophy etc. Consider if ENT examination would be clinically appropriate.  Deneise Lever Diplomate, American Board of Sleep Medicine  ELECTRONICALLY SIGNED ON:  04/24/2014, 2:00 PM [place] PH: [phone]   FX: [phone] [place]

## 2014-04-26 ENCOUNTER — Telehealth: Payer: Self-pay

## 2014-04-26 NOTE — Telephone Encounter (Signed)
Called and talked to [place] gave her all the information on sleep study I told her as soon as we hear back we will know what to do as far as cpap she verbalized understanding

## 2014-04-27 ENCOUNTER — Other Ambulatory Visit: Payer: Self-pay

## 2014-04-27 ENCOUNTER — Other Ambulatory Visit: Payer: Self-pay | Admitting: Family Medicine

## 2014-04-27 ENCOUNTER — Encounter: Payer: Self-pay | Admitting: Family Medicine

## 2014-04-27 DIAGNOSIS — Z0000 Encounter for general adult medical examination without abnormal findings: Secondary | ICD-10-CM

## 2014-04-27 MED ORDER — MEDICAL COMPRESSION SOCKS MISC: 2.0000 | Freq: Every day | Status: DC

## 2014-04-27 NOTE — Progress Notes (Signed)
   Subjective:    Patient ID: Dean Bowers, male    DOB: 1971-01-14, 44 y.o.   MRN: [id number]  HPI    Review of Systems     Objective:   Physical Exam        Assessment & Plan:  He recently had a sleep study done which showed severe sleep apnea with an AHI of 139.7. He was then tried on CPAP as well as BiPAP. Even the BiPAP did not improve this. He will need to be placed on NIV/Trilogy due to his chronic respiratory failure and COPD. Of note is the fact that his PCO2 while in the hospital was 95 with a PaO2 of 69.1.

## 2014-04-28 ENCOUNTER — Emergency Department (HOSPITAL_COMMUNITY): Payer: 59

## 2014-04-28 ENCOUNTER — Encounter (HOSPITAL_COMMUNITY): Payer: Self-pay

## 2014-04-28 ENCOUNTER — Ambulatory Visit (INDEPENDENT_AMBULATORY_CARE_PROVIDER_SITE_OTHER): Payer: PRIVATE HEALTH INSURANCE | Admitting: Family Medicine

## 2014-04-28 ENCOUNTER — Emergency Department (HOSPITAL_COMMUNITY)
Admission: EM | Admit: 2014-04-28 | Discharge: 2014-04-28 | Disposition: A | Discharge status: Home / Self Care | Payer: 59 | Attending: Emergency Medicine | Admitting: Emergency Medicine

## 2014-04-28 VITALS — BP 116/80 | HR 100

## 2014-04-28 DIAGNOSIS — M7989 Other specified soft tissue disorders: Secondary | ICD-10-CM

## 2014-04-28 DIAGNOSIS — E119 Type 2 diabetes mellitus without complications: Secondary | ICD-10-CM | POA: Diagnosis not present

## 2014-04-28 DIAGNOSIS — Z89511 Acquired absence of right leg below knee: Secondary | ICD-10-CM | POA: Diagnosis not present

## 2014-04-28 DIAGNOSIS — Z87828 Personal history of other (healed) physical injury and trauma: Secondary | ICD-10-CM | POA: Diagnosis not present

## 2014-04-28 DIAGNOSIS — Z87442 Personal history of urinary calculi: Secondary | ICD-10-CM | POA: Insufficient documentation

## 2014-04-28 DIAGNOSIS — F419 Anxiety disorder, unspecified: Secondary | ICD-10-CM | POA: Insufficient documentation

## 2014-04-28 DIAGNOSIS — Z79899 Other long term (current) drug therapy: Secondary | ICD-10-CM | POA: Insufficient documentation

## 2014-04-28 DIAGNOSIS — J449 Chronic obstructive pulmonary disease, unspecified: Secondary | ICD-10-CM | POA: Insufficient documentation

## 2014-04-28 DIAGNOSIS — I82622 Acute embolism and thrombosis of deep veins of left upper extremity: Secondary | ICD-10-CM

## 2014-04-28 DIAGNOSIS — Z720 Tobacco use: Secondary | ICD-10-CM | POA: Diagnosis not present

## 2014-04-28 DIAGNOSIS — I10 Essential (primary) hypertension: Secondary | ICD-10-CM | POA: Diagnosis not present

## 2014-04-28 DIAGNOSIS — G8929 Other chronic pain: Secondary | ICD-10-CM | POA: Diagnosis not present

## 2014-04-28 DIAGNOSIS — I2699 Other pulmonary embolism without acute cor pulmonale: Secondary | ICD-10-CM | POA: Insufficient documentation

## 2014-04-28 DIAGNOSIS — Z7982 Long term (current) use of aspirin: Secondary | ICD-10-CM | POA: Diagnosis not present

## 2014-04-28 DIAGNOSIS — Z792 Long term (current) use of antibiotics: Secondary | ICD-10-CM | POA: Insufficient documentation

## 2014-04-28 DIAGNOSIS — E669 Obesity, unspecified: Secondary | ICD-10-CM | POA: Insufficient documentation

## 2014-04-28 DIAGNOSIS — I82702 Chronic embolism and thrombosis of unspecified veins of left upper extremity: Secondary | ICD-10-CM | POA: Diagnosis not present

## 2014-04-28 DIAGNOSIS — R0600 Dyspnea, unspecified: Secondary | ICD-10-CM

## 2014-04-28 DIAGNOSIS — Z7901 Long term (current) use of anticoagulants: Secondary | ICD-10-CM | POA: Diagnosis not present

## 2014-04-28 DIAGNOSIS — R609 Edema, unspecified: Secondary | ICD-10-CM | POA: Diagnosis not present

## 2014-04-28 LAB — CBC WITH DIFFERENTIAL/PLATELET
BASOS ABS: 0 10*3/uL (ref 0.0–0.1)
Basophils Relative: 0 % (ref 0–1)
EOS PCT: 2 % (ref 0–5)
Eosinophils Absolute: 0.2 10*3/uL (ref 0.0–0.7)
HCT: 34.3 % — ABNORMAL LOW (ref 39.0–52.0)
Hemoglobin: 9.4 g/dL — ABNORMAL LOW (ref 13.0–17.0)
Lymphocytes Relative: 14 % (ref 12–46)
Lymphs Abs: 1.5 10*3/uL (ref 0.7–4.0)
MCH: 19.4 pg — ABNORMAL LOW (ref 26.0–34.0)
MCHC: 27.4 g/dL — ABNORMAL LOW (ref 30.0–36.0)
MCV: 70.7 fL — ABNORMAL LOW (ref 78.0–100.0)
MONOS PCT: 8 % (ref 3–12)
Monocytes Absolute: 0.8 10*3/uL (ref 0.1–1.0)
Neutro Abs: 8 10*3/uL — ABNORMAL HIGH (ref 1.7–7.7)
Neutrophils Relative %: 76 % (ref 43–77)
PLATELETS: 277 10*3/uL (ref 150–400)
RBC: 4.85 MIL/uL (ref 4.22–5.81)
RDW: 21.5 % — ABNORMAL HIGH (ref 11.5–15.5)
WBC: 10.5 10*3/uL (ref 4.0–10.5)

## 2014-04-28 LAB — COMPREHENSIVE METABOLIC PANEL
ALT: 36 U/L (ref 0–53)
ANION GAP: 7 (ref 5–15)
AST: 45 U/L — ABNORMAL HIGH (ref 0–37)
Albumin: 2.9 g/dL — ABNORMAL LOW (ref 3.5–5.2)
Alkaline Phosphatase: 76 U/L (ref 39–117)
BUN: 14 mg/dL (ref 6–23)
CO2: 38 mmol/L — ABNORMAL HIGH (ref 19–32)
CREATININE: 0.81 mg/dL (ref 0.50–1.35)
Calcium: 8.8 mg/dL (ref 8.4–10.5)
Chloride: 92 mmol/L — ABNORMAL LOW (ref 96–112)
GFR calc Af Amer: >90 mL/min (ref >90)
Glucose, Bld: 109 mg/dL — ABNORMAL HIGH (ref 70–99)
Potassium: 3.4 mmol/L — ABNORMAL LOW (ref 3.5–5.1)
SODIUM: 137 mmol/L (ref 135–145)
TOTAL PROTEIN: 7.7 g/dL (ref 6.0–8.3)
Total Bilirubin: 0.6 mg/dL (ref 0.3–1.2)

## 2014-04-28 LAB — PROTIME-INR
INR: 1.25 (ref 0.00–1.49)
Prothrombin Time: 15.9 seconds — ABNORMAL HIGH (ref 11.6–15.2)

## 2014-04-28 LAB — I-STAT TROPONIN, ED: Troponin i, poc: 0 ng/mL (ref 0.00–0.08)

## 2014-04-28 LAB — BRAIN NATRIURETIC PEPTIDE: B Natriuretic Peptide: 371.5 pg/mL — ABNORMAL HIGH (ref 0.0–100.0)

## 2014-04-28 MED ORDER — RIVAROXABAN (XARELTO) EDUCATION KIT FOR DVT/PE PATIENTS
PACK | Freq: Once | Status: AC
  Administered 2014-04-28: 22:00:00
  Filled 2014-04-28: qty 1

## 2014-04-28 MED ORDER — XARELTO VTE STARTER PACK 15 & 20 MG PO TBPK: 15.0000 mg | ORAL_TABLET | ORAL | Status: DC

## 2014-04-28 MED ORDER — OXYCODONE-ACETAMINOPHEN 5-325 MG PO TABS
1.0000 | ORAL_TABLET | Freq: Once | ORAL | Status: AC
  Administered 2014-04-28: 1 via ORAL
  Filled 2014-04-28: qty 1

## 2014-04-28 MED ORDER — OXYCODONE HCL 5 MG PO TABS
5.0000 mg | ORAL_TABLET | Freq: Once | ORAL | Status: AC
Start: 2014-04-28 — End: 2014-04-28
  Administered 2014-04-28: 5 mg via ORAL
  Filled 2014-04-28: qty 1

## 2014-04-28 MED ORDER — IOHEXOL 350 MG/ML SOLN
80.0000 mL | Freq: Once | INTRAVENOUS | Status: AC | PRN
  Administered 2014-04-28: 100 mL via INTRAVENOUS

## 2014-04-28 MED ORDER — RIVAROXABAN 15 MG PO TABS
15.0000 mg | ORAL_TABLET | Freq: Once | ORAL | Status: AC
  Administered 2014-04-28: 15 mg via ORAL
  Filled 2014-04-28: qty 1

## 2014-04-28 NOTE — ED Notes (Addendum)
Pt here for swelling to left arm and hand since yesterday. Was here for a revision to right stump two weeks ago and had IV in left hand. Had gone to his PCP and was sent over from there for the swelling to his left arm. Swelling goes up to his shoulder. Unable to palpate pulses in left wrist.

## 2014-04-28 NOTE — Discharge Instructions (Signed)
Pulmonary Embolism A pulmonary (lung) embolism (PE) is a blood clot that has traveled to the lung and results in a blockage of blood flow in the affected lung. Most clots come from deep veins in the legs or pelvis. PE is a dangerous and potentially life-threatening condition that can be treated if identified. CAUSES Blood clots form in a vein for different reasons. Usually several things cause blood clots. They include:  The flow of blood slows down.  The inside of the vein is damaged in some way.  The person has a condition that makes the blood clot more easily. RISK FACTORS Some people are more likely than others to develop PE. Risk factors include:   Smoking.  Being overweight (obese).  Sitting or lying still for a long time. This includes long-distance travel, paralysis, or recovery from an illness or surgery. Other factors that increase risk are:   Older age, especially over 72 years of age.  Having a family history of blood clots or if you have already had a blood clot.  Having major or lengthy surgery. This is especially true for surgery on the hip, knee, or belly (abdomen). Hip surgery is particularly high risk.  Having a long, thin tube (catheter) placed inside a vein during a medical procedure.  Breaking a hip or leg.  Having cancer or cancer treatment.  Medicines containing the male hormone estrogen. This includes birth control pills and hormone replacement therapy.  Other circulation or heart problems.  Pregnancy and childbirth.  Hormone changes make the blood clot more easily during pregnancy.  The fetus puts pressure on the veins of the pelvis.  There is a risk of injury to veins during delivery or a caesarean delivery. The risk is highest just after childbirth.  PREVENTION   Exercise the legs regularly. Take a brisk 30 minute walk every day.  Maintain a weight that is appropriate for your height.  Avoid sitting or lying in bed for long periods of  time without moving your legs.  Women, particularly those over the age of 28 years, should consider the risks and benefits of taking estrogen medicines, including birth control pills.  Do not smoke, especially if you take estrogen medicines.  Long-distance travel can increase your risk. You should exercise your legs by walking or pumping the muscles every hour.  Many of the risk factors above relate to situations that exist with hospitalization, either for illness, injury, or elective surgery. Prevention may include medical and nonmedical measures.   Your health care provider will assess you for the need for venous thromboembolism prevention when you are admitted to the hospital. If you are having surgery, your surgeon will assess you the day of or day after surgery.  SYMPTOMS  The symptoms of a PE usually start suddenly and include:  Shortness of breath.  Coughing.  Coughing up blood or blood-tinged mucus.  Chest pain. Pain is often worse with deep breaths.  Rapid heartbeat. DIAGNOSIS  If a PE is suspected, your health care provider will take a medical history and perform a physical exam. Other tests that may be required include:  Blood tests, such as studies of the clotting properties of your blood.  Imaging tests, such as ultrasound, CT, MRI, and other tests to see if you have clots in your legs or lungs.  An electrocardiogram. This can look for heart strain from blood clots in the lungs. TREATMENT   The most common treatment for a PE is blood thinning (anticoagulant) medicine, which reduces  the blood's tendency to clot. Anticoagulants can stop new blood clots from forming and old clots from growing. They cannot dissolve existing clots. Your body does this by itself over time. Anticoagulants can be given by mouth, through an intravenous (IV) tube, or by injection. Your health care provider will determine the best program for you.  Less commonly, clot-dissolving medicines  (thrombolytics) are used to dissolve a PE. They carry a high risk of bleeding, so they are used mainly in severe cases.  Very rarely, a blood clot in the leg needs to be removed surgically.  If you are unable to take anticoagulants, your health care provider may arrange for you to have a filter placed in a main vein in your abdomen. This filter prevents clots from traveling to your lungs. HOME CARE INSTRUCTIONS   Take all medicines as directed by your health care provider.  Learn as much as you can about DVT.  Wear a medical alert bracelet or carry a medical alert card.  Ask your health care provider how soon you can go back to normal activities. It is important to stay active to prevent blood clots. If you are on anticoagulant medicine, avoid contact sports.  It is very important to exercise. This is especially important while traveling, sitting, or standing for long periods of time. Exercise your legs by walking or by tightening and relaxing your leg muscles regularly. Take frequent walks.  You may need to wear compression stockings. These are tight elastic stockings that apply pressure to the lower legs. This pressure can help keep the blood in the legs from clotting. Taking Warfarin Warfarin is a daily medicine that is taken by mouth. Your health care provider will advise you on the length of treatment (usually 3-6 months, sometimes lifelong). If you take warfarin:  Understand how to take warfarin and foods that can affect how warfarin works in Veterinary surgeon.  Too much and too little warfarin are both dangerous. Too much warfarin increases the risk of bleeding. Too little warfarin continues to allow the risk for blood clots. Warfarin and Regular Blood Testing While taking warfarin, you will need to have regular blood tests to measure your blood clotting time. These blood tests usually include both the prothrombin time (PT) and international normalized ratio (INR) tests. The PT and INR  results allow your health care provider to adjust your dose of warfarin. It is very important that you have your PT and INR tested as often as directed by your health care provider.  Warfarin and Your Diet Avoid major changes in your diet, or notify your health care provider before changing your diet. Arrange a visit with a registered dietitian to answer your questions. Many foods, especially foods high in vitamin K, can interfere with warfarin and affect the PT and INR results. You should eat a consistent amount of foods high in vitamin K. Foods high in vitamin K include:   Spinach, kale, broccoli, cabbage, collard and turnip greens, Brussels sprouts, peas, cauliflower, seaweed, and parsley.  Beef and pork liver.  Green tea.  Soybean oil. Warfarin with Other Medicines Many medicines can interfere with warfarin and affect the PT and INR results. You must:  Tell your health care provider about any and all medicines, vitamins, and supplements you take, including aspirin and other over-the-counter anti-inflammatory medicines. Be especially cautious with aspirin and anti-inflammatory medicines. Ask your health care provider before taking these.  Do not take or discontinue any prescribed or over-the-counter medicine except on the advice  of your health care provider or pharmacist. Warfarin Side Effects Warfarin can have side effects, such as easy bruising and difficulty stopping bleeding. Ask your health care provider or pharmacist about other side effects of warfarin. You will need to:  Hold pressure over cuts for longer than usual.  Notify your dentist and other health care providers that you are taking warfarin before you undergo any procedures where bleeding may occur. Warfarin with Alcohol and Tobacco   Drinking alcohol frequently can increase the effect of warfarin, leading to excess bleeding. It is best to avoid alcoholic drinks or consume only very small amounts while taking warfarin.  Notify your health care provider if you change your alcohol intake.  Do not use any tobacco products including cigarettes, chewing tobacco, or electronic cigarettes. If you smoke, quit. Ask your health care provider for help with quitting smoking. Alternative Medicines to Warfarin: Factor Xa Inhibitor Medicines  These blood thinning medicines are taken by mouth, usually for several weeks or longer. It is important to take the medicine every single day, at the same time each day.  There are no regular blood tests required when using these medicines.  There are fewer food and drug interactions than with warfarin.  The side effects of this class of medicine is similar to that of warfarin, including excessive bruising or bleeding. Ask your health care provider or pharmacist about other potential side effects. SEEK MEDICAL CARE IF:   You notice a rapid heartbeat.  You feel weaker or more tired than usual.  You feel faint.  You notice increased bruising.  Your symptoms are not getting better in the time expected.  You are having side effects of medicine. SEEK IMMEDIATE MEDICAL CARE IF:   You have chest pain.  You have trouble breathing.  You have new or increased swelling or pain in one leg.  You cough up blood.  You notice blood in vomit, in a bowel movement, or in urine.  You have a fever. Symptoms of PE may represent a serious problem that is an emergency. Do not wait to see if the symptoms will go away. Get medical help right away. Call your local emergency services (911 in the [place]). Do not drive yourself to the hospital. Document Released: 01/20/2000 Document Revised: 06/08/2013 Document Reviewed: 02/02/2013 [employer] Patient Information 2015 [place], [place]. This information is not intended to replace advice given to you by your health care provider. Make sure you discuss any questions you have with your health care provider.   Deep Vein Thrombosis A deep vein  thrombosis (DVT) is a blood clot that develops in the deep, larger veins of the leg, arm, or pelvis. These are more dangerous than clots that might form in veins near the surface of the body. A DVT can lead to serious and even life-threatening complications if the clot breaks off and travels in the bloodstream to the lungs.  A DVT can damage the valves in your leg veins so that instead of flowing upward, the blood pools in the lower leg. This is called post-thrombotic syndrome, and it can result in pain, swelling, discoloration, and sores on the leg. CAUSES Usually, several things contribute to the formation of blood clots. Contributing factors include:  The flow of blood slows down.  The inside of the vein is damaged in some way.  You have a condition that makes blood clot more easily. RISK FACTORS Some people are more likely than others to develop blood clots. Risk factors include:  Smoking.  Being overweight (obese).  Sitting or lying still for a long time. This includes long-distance travel, paralysis, or recovery from an illness or surgery. Other factors that increase risk are:   Older age, especially over 20 years of age.  Having a family history of blood clots or if you have already had a blot clot.  Having major or lengthy surgery. This is especially true for surgery on the hip, knee, or belly (abdomen). Hip surgery is particularly high risk.  Having a long, thin tube (catheter) placed inside a vein during a medical procedure.  Breaking a hip or leg.  Having cancer or cancer treatment.  Pregnancy and childbirth.  Hormone changes make the blood clot more easily during pregnancy.  The fetus puts pressure on the veins of the pelvis.  There is a risk of injury to veins during delivery or a caesarean delivery. The risk is highest just after childbirth.  Medicines containing the male hormone estrogen. This includes birth control pills and hormone replacement  therapy.  Other circulation or heart problems.  SIGNS AND SYMPTOMS When a clot forms, it can either partially or totally block the blood flow in that vein. Symptoms of a DVT can include:  Swelling of the leg or arm, especially if one side is much worse.  Warmth and redness of the leg or arm, especially if one side is much worse.  Pain in an arm or leg. If the clot is in the leg, symptoms may be more noticeable or worse when standing or walking. The symptoms of a DVT that has traveled to the lungs (pulmonary embolism, PE) usually start suddenly and include:  Shortness of breath.  Coughing.  Coughing up blood or blood-tinged mucus.  Chest pain. The chest pain is often worse with deep breaths.  Rapid heartbeat. Anyone with these symptoms should get emergency medical treatment right away. Do not wait to see if the symptoms will go away. Call your local emergency services (911 in the [place] if you have these symptoms. Do not drive yourself to the hospital. DIAGNOSIS If a DVT is suspected, your health care provider will take a full medical history and perform a physical exam. Tests that also may be required include:  Blood tests, including studies of the clotting properties of the blood.  Ultrasound to see if you have clots in your legs or lungs.  X-rays to show the flow of blood when dye is injected into the veins (venogram).  Studies of your lungs if you have any chest symptoms. PREVENTION  Exercise the legs regularly. Take a brisk 30-minute walk every day.  Maintain a weight that is appropriate for your height.  Avoid sitting or lying in bed for long periods of time without moving your legs.  Women, particularly those over the age of 21 years, should consider the risks and benefits of taking estrogen medicines, including birth control pills.  Do not smoke, especially if you take estrogen medicines.  Long-distance travel can increase your risk of DVT. You should exercise your  legs by walking or pumping the muscles every hour.  Many of the risk factors above relate to situations that exist with hospitalization, either for illness, injury, or elective surgery. Prevention may include medical and nonmedical measures.  Your health care provider will assess you for the need for venous thromboembolism prevention when you are admitted to the hospital. If you are having surgery, your surgeon will assess you the day of or day after surgery. TREATMENT Once identified,  a DVT can be treated. It can also be prevented in some circumstances. Once you have had a DVT, you may be at increased risk for a DVT in the future. The most common treatment for DVT is blood-thinning (anticoagulant) medicine, which reduces the blood's tendency to clot. Anticoagulants can stop new blood clots from forming and stop old clots from growing. They cannot dissolve existing clots. Your body does this by itself over time. Anticoagulants can be given by mouth, through an IV tube, or by injection. Your health care provider will determine the best program for you. Other medicines or treatments that may be used are:  Heparin or related medicines (low molecular weight heparin) are often the first treatment for a blood clot. They act quickly. However, they cannot be taken orally and must be given either in shot form or by IV tube.  Heparin can cause a fall in a component of blood that stops bleeding and forms blood clots (platelets). You will be monitored with blood tests to be sure this does not occur.  Warfarin is an anticoagulant that can be swallowed. It takes a few days to start working, so usually heparin or related medicines are used in combination. Once warfarin is working, heparin is usually stopped.  Factor Xa inhibitor medicines, such as rivaroxaban and apixaban, also reduce blood clotting. These medicines are taken orally and can often be used without heparin or related medicines.  Less commonly, clot  dissolving drugs (thrombolytics) are used to dissolve a DVT. They carry a high risk of bleeding, so they are used mainly in severe cases where your life or a part of your body is threatened.  Very rarely, a blood clot in the leg needs to be removed surgically.  If you are unable to take anticoagulants, your health care provider may arrange for you to have a filter placed in a main vein in your abdomen. This filter prevents clots from traveling to your lungs. HOME CARE INSTRUCTIONS  Take all medicines as directed by your health care provider.  Learn as much as you can about DVT.  Wear a medical alert bracelet or carry a medical alert card.  Ask your health care provider how soon you can go back to normal activities. It is important to stay active to prevent blood clots. If you are on anticoagulant medicine, avoid contact sports.  It is very important to exercise. This is especially important while traveling, sitting, or standing for long periods of time. Exercise your legs by walking or by tightening and relaxing your leg muscles regularly. Take frequent walks.  You may need to wear compression stockings. These are tight elastic stockings that apply pressure to the lower legs. This pressure can help keep the blood in the legs from clotting. Taking Warfarin Warfarin is a daily medicine that is taken by mouth. Your health care provider will advise you on the length of treatment (usually 3-6 months, sometimes lifelong). If you take warfarin:  Understand how to take warfarin and foods that can affect how warfarin works in Veterinary surgeon.  Too much and too little warfarin are both dangerous. Too much warfarin increases the risk of bleeding. Too little warfarin continues to allow the risk for blood clots. Warfarin and Regular Blood Testing While taking warfarin, you will need to have regular blood tests to measure your blood clotting time. These blood tests usually include both the prothrombin time (PT)  and international normalized ratio (INR) tests. The PT and INR results allow your health  care provider to adjust your dose of warfarin. It is very important that you have your PT and INR tested as often as directed by your health care provider.  Warfarin and Your Diet Avoid major changes in your diet, or notify your health care provider before changing your diet. Arrange a visit with a registered dietitian to answer your questions. Many foods, especially foods high in vitamin K, can interfere with warfarin and affect the PT and INR results. You should eat a consistent amount of foods high in vitamin K. Foods high in vitamin K include:   Spinach, kale, broccoli, cabbage, collard and turnip greens, Brussels sprouts, peas, cauliflower, seaweed, and parsley.  Beef and pork liver.  Green tea.  Soybean oil. Warfarin with Other Medicines Many medicines can interfere with warfarin and affect the PT and INR results. You must:  Tell your health care provider about any and all medicines, vitamins, and supplements you take, including aspirin and other over-the-counter anti-inflammatory medicines. Be especially cautious with aspirin and anti-inflammatory medicines. Ask your health care provider before taking these.  Do not take or discontinue any prescribed or over-the-counter medicine except on the advice of your health care provider or pharmacist. Warfarin Side Effects Warfarin can have side effects, such as easy bruising and difficulty stopping bleeding. Ask your health care provider or pharmacist about other side effects of warfarin. You will need to:  Hold pressure over cuts for longer than usual.  Notify your dentist and other health care providers that you are taking warfarin before you undergo any procedures where bleeding may occur. Warfarin with Alcohol and Tobacco   Drinking alcohol frequently can increase the effect of warfarin, leading to excess bleeding. It is best to avoid alcoholic  drinks or to consume only very small amounts while taking warfarin. Notify your health care provider if you change your alcohol intake.   Do not use any tobacco products including cigarettes, chewing tobacco, or electronic cigarettes. If you smoke, quit. Ask your health care provider for help with quitting smoking. Alternative Medicines to Warfarin: Factor Xa Inhibitor Medicines  These blood-thinning medicines are taken by mouth, usually for several weeks or longer. It is important to take the medicine every single day at the same time each day.  There are no regular blood tests required when using these medicines.  There are fewer food and drug interactions than with warfarin.  The side effects of this class of medicine are similar to those of warfarin, including excessive bruising or bleeding. Ask your health care provider or pharmacist about other potential side effects. SEEK MEDICAL CARE IF:  You notice a rapid heartbeat.  You feel weaker or more tired than usual.  You feel faint.  You notice increased bruising.  You feel your symptoms are not getting better in the time expected.  You believe you are having side effects of medicine. SEEK IMMEDIATE MEDICAL CARE IF:  You have chest pain.  You have trouble breathing.  You have new or increased swelling or pain in one leg.  You cough up blood.  You notice blood in vomit, in a bowel movement, or in urine. MAKE SURE YOU:  Understand these instructions.  Will watch your condition.  Will get help right away if you are not doing well or get worse. Document Released: 01/22/2005 Document Revised: 06/08/2013 Document Reviewed: 09/29/2012 [facility] Patient Information 2015 [place], [place]. This information is not intended to replace advice given to you by your health care provider. Make sure you discuss  any questions you have with your health care provider. ° °
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
## 2014-04-28 NOTE — Progress Notes (Signed)
   Subjective:    Patient ID: Dean Bowers, male    DOB: May 24, 1970, 44 y.o.   MRN: [id number]  HPI He is here for evaluation of a two-day history of swelling of the left hand and arm. He has had no recent injury to his left arm.   Review of Systems     Objective:   Physical Exam. The entire left arm shows 2-3+ pitting edema.no tenderness to palpation, pulses were difficult to feel. The arm is warm to touch.       Assessment & Plan:  Pitting edema he is having some kind of vascular compromise. I will send him to the emergency room for further evaluation.

## 2014-05-03 ENCOUNTER — Ambulatory Visit (INDEPENDENT_AMBULATORY_CARE_PROVIDER_SITE_OTHER): Payer: PRIVATE HEALTH INSURANCE | Admitting: Family Medicine

## 2014-05-03 ENCOUNTER — Emergency Department (HOSPITAL_COMMUNITY): Payer: 59

## 2014-05-03 ENCOUNTER — Encounter (HOSPITAL_COMMUNITY): Payer: Self-pay | Admitting: Neurology

## 2014-05-03 ENCOUNTER — Encounter: Payer: Self-pay | Admitting: Family Medicine

## 2014-05-03 ENCOUNTER — Inpatient Hospital Stay (HOSPITAL_COMMUNITY)
Admission: EM | Admit: 2014-05-03 | Discharge: 2014-05-10 | DRG: 291 | Disposition: A | Discharge status: Home Health | Payer: 59 | Attending: Internal Medicine | Admitting: Internal Medicine

## 2014-05-03 VITALS — BP 140/80 | HR 70

## 2014-05-03 DIAGNOSIS — I50813 Acute on chronic right heart failure: Secondary | ICD-10-CM | POA: Diagnosis present

## 2014-05-03 DIAGNOSIS — Z833 Family history of diabetes mellitus: Secondary | ICD-10-CM | POA: Diagnosis not present

## 2014-05-03 DIAGNOSIS — I272 Other secondary pulmonary hypertension: Secondary | ICD-10-CM | POA: Diagnosis present

## 2014-05-03 DIAGNOSIS — R609 Edema, unspecified: Secondary | ICD-10-CM

## 2014-05-03 DIAGNOSIS — R946 Abnormal results of thyroid function studies: Secondary | ICD-10-CM | POA: Diagnosis not present

## 2014-05-03 DIAGNOSIS — E1151 Type 2 diabetes mellitus with diabetic peripheral angiopathy without gangrene: Secondary | ICD-10-CM | POA: Diagnosis present

## 2014-05-03 DIAGNOSIS — Z79899 Other long term (current) drug therapy: Secondary | ICD-10-CM

## 2014-05-03 DIAGNOSIS — I5033 Acute on chronic diastolic (congestive) heart failure: Secondary | ICD-10-CM | POA: Diagnosis present

## 2014-05-03 DIAGNOSIS — D509 Iron deficiency anemia, unspecified: Secondary | ICD-10-CM | POA: Diagnosis present

## 2014-05-03 DIAGNOSIS — Z89511 Acquired absence of right leg below knee: Secondary | ICD-10-CM | POA: Diagnosis not present

## 2014-05-03 DIAGNOSIS — Z7901 Long term (current) use of anticoagulants: Secondary | ICD-10-CM

## 2014-05-03 DIAGNOSIS — Z86718 Personal history of other venous thrombosis and embolism: Secondary | ICD-10-CM

## 2014-05-03 DIAGNOSIS — E119 Type 2 diabetes mellitus without complications: Secondary | ICD-10-CM | POA: Diagnosis not present

## 2014-05-03 DIAGNOSIS — Z7982 Long term (current) use of aspirin: Secondary | ICD-10-CM

## 2014-05-03 DIAGNOSIS — I5032 Chronic diastolic (congestive) heart failure: Secondary | ICD-10-CM | POA: Diagnosis present

## 2014-05-03 DIAGNOSIS — F1210 Cannabis abuse, uncomplicated: Secondary | ICD-10-CM | POA: Diagnosis present

## 2014-05-03 DIAGNOSIS — E876 Hypokalemia: Secondary | ICD-10-CM | POA: Diagnosis present

## 2014-05-03 DIAGNOSIS — I5023 Acute on chronic systolic (congestive) heart failure: Secondary | ICD-10-CM | POA: Diagnosis present

## 2014-05-03 DIAGNOSIS — R601 Generalized edema: Secondary | ICD-10-CM | POA: Diagnosis not present

## 2014-05-03 DIAGNOSIS — Z8249 Family history of ischemic heart disease and other diseases of the circulatory system: Secondary | ICD-10-CM

## 2014-05-03 DIAGNOSIS — F1410 Cocaine abuse, uncomplicated: Secondary | ICD-10-CM | POA: Diagnosis present

## 2014-05-03 DIAGNOSIS — R740 Nonspecific elevation of levels of transaminase and lactic acid dehydrogenase [LDH]: Secondary | ICD-10-CM | POA: Diagnosis present

## 2014-05-03 DIAGNOSIS — I2699 Other pulmonary embolism without acute cor pulmonale: Secondary | ICD-10-CM | POA: Diagnosis present

## 2014-05-03 DIAGNOSIS — J9621 Acute and chronic respiratory failure with hypoxia: Secondary | ICD-10-CM

## 2014-05-03 DIAGNOSIS — Z9981 Dependence on supplemental oxygen: Secondary | ICD-10-CM

## 2014-05-03 DIAGNOSIS — Z888 Allergy status to other drugs, medicaments and biological substances status: Secondary | ICD-10-CM | POA: Diagnosis not present

## 2014-05-03 DIAGNOSIS — F419 Anxiety disorder, unspecified: Secondary | ICD-10-CM | POA: Diagnosis present

## 2014-05-03 DIAGNOSIS — E785 Hyperlipidemia, unspecified: Secondary | ICD-10-CM | POA: Diagnosis present

## 2014-05-03 DIAGNOSIS — G8929 Other chronic pain: Secondary | ICD-10-CM | POA: Diagnosis present

## 2014-05-03 DIAGNOSIS — G4733 Obstructive sleep apnea (adult) (pediatric): Secondary | ICD-10-CM | POA: Diagnosis present

## 2014-05-03 DIAGNOSIS — Z91199 Patient's noncompliance with other medical treatment and regimen due to unspecified reason: Secondary | ICD-10-CM

## 2014-05-03 DIAGNOSIS — Z9119 Patient's noncompliance with other medical treatment and regimen: Secondary | ICD-10-CM

## 2014-05-03 DIAGNOSIS — I10 Essential (primary) hypertension: Secondary | ICD-10-CM | POA: Diagnosis present

## 2014-05-03 DIAGNOSIS — I82622 Acute embolism and thrombosis of deep veins of left upper extremity: Secondary | ICD-10-CM | POA: Diagnosis not present

## 2014-05-03 DIAGNOSIS — F17210 Nicotine dependence, cigarettes, uncomplicated: Secondary | ICD-10-CM | POA: Diagnosis present

## 2014-05-03 DIAGNOSIS — J449 Chronic obstructive pulmonary disease, unspecified: Secondary | ICD-10-CM | POA: Diagnosis present

## 2014-05-03 DIAGNOSIS — M549 Dorsalgia, unspecified: Secondary | ICD-10-CM | POA: Diagnosis present

## 2014-05-03 DIAGNOSIS — Z6841 Body Mass Index (BMI) 40.0 and over, adult: Secondary | ICD-10-CM

## 2014-05-03 DIAGNOSIS — I509 Heart failure, unspecified: Secondary | ICD-10-CM | POA: Diagnosis not present

## 2014-05-03 DIAGNOSIS — J811 Chronic pulmonary edema: Secondary | ICD-10-CM | POA: Diagnosis present

## 2014-05-03 DIAGNOSIS — E039 Hypothyroidism, unspecified: Secondary | ICD-10-CM | POA: Diagnosis present

## 2014-05-03 DIAGNOSIS — R7989 Other specified abnormal findings of blood chemistry: Secondary | ICD-10-CM | POA: Diagnosis present

## 2014-05-03 HISTORY — DX: Long term (current) use of anticoagulants: Z79.01

## 2014-05-03 HISTORY — DX: Anemia, unspecified: D64.9

## 2014-05-03 HISTORY — DX: Other pulmonary embolism without acute cor pulmonale: I26.99

## 2014-05-03 LAB — BASIC METABOLIC PANEL
ANION GAP: 12 (ref 5–15)
BUN: 16 mg/dL (ref 6–23)
CALCIUM: 8.6 mg/dL (ref 8.4–10.5)
CO2: 34 mmol/L — ABNORMAL HIGH (ref 19–32)
CREATININE: 0.98 mg/dL (ref 0.50–1.35)
Chloride: 89 mmol/L — ABNORMAL LOW (ref 96–112)
GFR calc Af Amer: >90 mL/min (ref >90)
GFR calc non Af Amer: >90 mL/min (ref >90)
Glucose, Bld: 112 mg/dL — ABNORMAL HIGH (ref 70–99)
Potassium: 3.2 mmol/L — ABNORMAL LOW (ref 3.5–5.1)
Sodium: 135 mmol/L (ref 135–145)

## 2014-05-03 LAB — CBC
HCT: 34.7 % — ABNORMAL LOW (ref 39.0–52.0)
Hemoglobin: 9.1 g/dL — ABNORMAL LOW (ref 13.0–17.0)
MCH: 18.7 pg — ABNORMAL LOW (ref 26.0–34.0)
MCHC: 26.2 g/dL — ABNORMAL LOW (ref 30.0–36.0)
MCV: 71.3 fL — ABNORMAL LOW (ref 78.0–100.0)
Platelets: 374 10*3/uL (ref 150–400)
RBC: 4.87 MIL/uL (ref 4.22–5.81)
RDW: 21.5 % — ABNORMAL HIGH (ref 11.5–15.5)
WBC: 10.4 10*3/uL (ref 4.0–10.5)

## 2014-05-03 LAB — BRAIN NATRIURETIC PEPTIDE: B NATRIURETIC PEPTIDE 5: 451.1 pg/mL — AB (ref 0.0–100.0)

## 2014-05-03 LAB — I-STAT TROPONIN, ED: Troponin i, poc: 0.01 ng/mL (ref 0.00–0.08)

## 2014-05-03 LAB — GLUCOSE, CAPILLARY: Glucose-Capillary: 108 mg/dL — ABNORMAL HIGH (ref 70–99)

## 2014-05-03 MED ORDER — FUROSEMIDE 10 MG/ML IJ SOLN
40.0000 mg | Freq: Two times a day (BID) | INTRAMUSCULAR | Status: DC
  Administered 2014-05-04: 40 mg via INTRAVENOUS
  Filled 2014-05-03 (×3): qty 4

## 2014-05-03 MED ORDER — ASPIRIN EC 81 MG PO TBEC
81.0000 mg | DELAYED_RELEASE_TABLET | Freq: Every day | ORAL | Status: DC
  Administered 2014-05-04 – 2014-05-10 (×7): 81 mg via ORAL
  Filled 2014-05-03 (×7): qty 1

## 2014-05-03 MED ORDER — DOXYCYCLINE HYCLATE 100 MG PO TABS
100.0000 mg | ORAL_TABLET | Freq: Two times a day (BID) | ORAL | Status: DC
  Administered 2014-05-03 – 2014-05-04 (×2): 100 mg via ORAL
  Filled 2014-05-03 (×3): qty 1

## 2014-05-03 MED ORDER — POTASSIUM CHLORIDE CRYS ER 20 MEQ PO TBCR
20.0000 meq | EXTENDED_RELEASE_TABLET | Freq: Once | ORAL | Status: AC
  Administered 2014-05-03: 20 meq via ORAL
  Filled 2014-05-03: qty 1

## 2014-05-03 MED ORDER — SODIUM CHLORIDE 0.9 % IV SOLN: 250.0000 mL | INTRAVENOUS | Status: DC | PRN

## 2014-05-03 MED ORDER — OXYCODONE-ACETAMINOPHEN 5-325 MG PO TABS
1.0000 | ORAL_TABLET | Freq: Three times a day (TID) | ORAL | Status: DC | PRN
  Administered 2014-05-03 – 2014-05-10 (×12): 1 via ORAL
  Filled 2014-05-03 (×12): qty 1

## 2014-05-03 MED ORDER — INSULIN ASPART 100 UNIT/ML ~~LOC~~ SOLN
0.0000 [IU] | Freq: Three times a day (TID) | SUBCUTANEOUS | Status: DC
Start: 2014-05-04 — End: 2014-05-10

## 2014-05-03 MED ORDER — SODIUM CHLORIDE 0.9 % IJ SOLN: 3.0000 mL | INTRAMUSCULAR | Status: DC | PRN

## 2014-05-03 MED ORDER — ACETAMINOPHEN 325 MG PO TABS: 650.0000 mg | ORAL_TABLET | Freq: Four times a day (QID) | ORAL | Status: DC | PRN

## 2014-05-03 MED ORDER — DOXYCYCLINE HYCLATE 100 MG PO CPEP: 100.0000 mg | ORAL_CAPSULE | Freq: Two times a day (BID) | ORAL | Status: DC

## 2014-05-03 MED ORDER — OXYCODONE-ACETAMINOPHEN 10-325 MG PO TABS: 1.0000 | ORAL_TABLET | Freq: Three times a day (TID) | ORAL | Status: DC | PRN

## 2014-05-03 MED ORDER — SODIUM CHLORIDE 0.9 % IJ SOLN
3.0000 mL | Freq: Two times a day (BID) | INTRAMUSCULAR | Status: DC
  Administered 2014-05-03 – 2014-05-07 (×5): 3 mL via INTRAVENOUS
  Administered 2014-05-08: 10 mL via INTRAVENOUS
  Administered 2014-05-09: 3 mL via INTRAVENOUS

## 2014-05-03 MED ORDER — INSULIN ASPART 100 UNIT/ML ~~LOC~~ SOLN: 0.0000 [IU] | Freq: Every day | SUBCUTANEOUS | Status: DC

## 2014-05-03 MED ORDER — DILTIAZEM HCL ER COATED BEADS 120 MG PO CP24
120.0000 mg | ORAL_CAPSULE | Freq: Every day | ORAL | Status: DC
  Administered 2014-05-04 – 2014-05-10 (×7): 120 mg via ORAL
  Filled 2014-05-03 (×7): qty 1

## 2014-05-03 MED ORDER — GABAPENTIN 600 MG PO TABS
600.0000 mg | ORAL_TABLET | Freq: Two times a day (BID) | ORAL | Status: DC
  Administered 2014-05-03 – 2014-05-10 (×14): 600 mg via ORAL
  Filled 2014-05-03 (×16): qty 1

## 2014-05-03 MED ORDER — OXYCODONE HCL 5 MG PO TABS
5.0000 mg | ORAL_TABLET | Freq: Three times a day (TID) | ORAL | Status: DC | PRN
  Administered 2014-05-03 – 2014-05-10 (×11): 5 mg via ORAL
  Filled 2014-05-03 (×11): qty 1

## 2014-05-03 MED ORDER — ACETAMINOPHEN 500 MG PO TABS: 1000.0000 mg | ORAL_TABLET | Freq: Every day | ORAL | Status: DC | PRN

## 2014-05-03 MED ORDER — POTASSIUM CHLORIDE CRYS ER 20 MEQ PO TBCR
40.0000 meq | EXTENDED_RELEASE_TABLET | Freq: Once | ORAL | Status: AC
  Administered 2014-05-03: 40 meq via ORAL
  Filled 2014-05-03: qty 2

## 2014-05-03 MED ORDER — FUROSEMIDE 10 MG/ML IJ SOLN
80.0000 mg | Freq: Once | INTRAMUSCULAR | Status: AC
  Administered 2014-05-03: 80 mg via INTRAVENOUS
  Filled 2014-05-03: qty 8

## 2014-05-03 MED ORDER — SODIUM CHLORIDE 0.9 % IJ SOLN
3.0000 mL | Freq: Two times a day (BID) | INTRAMUSCULAR | Status: DC
  Administered 2014-05-04 – 2014-05-07 (×5): 3 mL via INTRAVENOUS
  Administered 2014-05-08: 10 mL via INTRAVENOUS

## 2014-05-03 MED ORDER — ALBUTEROL SULFATE (2.5 MG/3ML) 0.083% IN NEBU: 2.5000 mg | INHALATION_SOLUTION | Freq: Four times a day (QID) | RESPIRATORY_TRACT | Status: DC | PRN

## 2014-05-03 MED ORDER — RIVAROXABAN 20 MG PO TABS: 20.0000 mg | ORAL_TABLET | Freq: Every day | ORAL | Status: DC

## 2014-05-03 MED ORDER — RIVAROXABAN 15 MG PO TABS
15.0000 mg | ORAL_TABLET | Freq: Two times a day (BID) | ORAL | Status: DC
  Administered 2014-05-04 – 2014-05-10 (×14): 15 mg via ORAL
  Filled 2014-05-03 (×19): qty 1

## 2014-05-03 MED ORDER — POTASSIUM CHLORIDE ER 10 MEQ PO TBCR
10.0000 meq | EXTENDED_RELEASE_TABLET | Freq: Every day | ORAL | Status: DC
  Filled 2014-05-03: qty 1

## 2014-05-03 MED ORDER — ALPRAZOLAM 0.5 MG PO TABS
0.5000 mg | ORAL_TABLET | Freq: Three times a day (TID) | ORAL | Status: DC | PRN
  Administered 2014-05-04 – 2014-05-07 (×5): 0.5 mg via ORAL
  Filled 2014-05-03 (×5): qty 1

## 2014-05-03 MED ORDER — ACETAMINOPHEN 650 MG RE SUPP: 650.0000 mg | Freq: Four times a day (QID) | RECTAL | Status: DC | PRN
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
## 2014-05-03 NOTE — Progress Notes (Signed)
   Subjective:    Patient ID: Dean Bowers, male    DOB: 03/31/1970, 44 y.o.   MRN: [id number]  HPI He is here for recheck. He was recently diagnosed with DVT of the upper extremity. He does state that the left arm is less swollen. He also notes increased swelling in his abdominal area. He is also noted decreased urinary output. He continues on into new is oxygen. He is supposed to get a new machine for his OSA. The BiPAP was only able to drop his AHI from 139 down to 130. The machine is supposed to arrive tomorrow.   Review of Systems     Objective:   Physical Exam Alert and pale/ashen appearing with slight tachypnea. Pulse ox was 90-91 on O2. Diffuse edema is noted in his extremities as well as several centimeters above the umbilicus. It is 3+ pitting. Exam of the left arm does show edema distal to the deltoid muscle. On the previous exam the edema was to his entire shoulder.       Assessment & Plan:  Personal history of noncompliance with medical treatment, presenting hazards to health  Pitting edema  Obesity, Class III, BMI 40-49.9 (morbid obesity)  Acute on chronic respiratory failure with hypoxia  DVT of upper extremity (deep vein thrombosis), left  Anasarca explained that he needs to go to the hospital for further evaluation and explained that if he does not he could end up dying from fluid retention and further pulmonary issues. He is very reluctant to go. I explained to his wife that if he gets worse tonight, to call the EMS. He did have an echo done in January but the study was inadequate. I still think that a lot of this is right heart failure.
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
## 2014-05-03 NOTE — ED Notes (Addendum)
Pt reports SOB and fluid retention since December. Pt in wheelchair. Denies pain. Pt is a x 4.
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
## 2014-05-04 DIAGNOSIS — J9621 Acute and chronic respiratory failure with hypoxia: Secondary | ICD-10-CM

## 2014-05-04 DIAGNOSIS — R7989 Other specified abnormal findings of blood chemistry: Secondary | ICD-10-CM | POA: Diagnosis present

## 2014-05-04 DIAGNOSIS — I5033 Acute on chronic diastolic (congestive) heart failure: Principal | ICD-10-CM

## 2014-05-04 DIAGNOSIS — G4733 Obstructive sleep apnea (adult) (pediatric): Secondary | ICD-10-CM

## 2014-05-04 LAB — CBC
HCT: 33.8 % — ABNORMAL LOW (ref 39.0–52.0)
Hemoglobin: 8.8 g/dL — ABNORMAL LOW (ref 13.0–17.0)
MCH: 19 pg — AB (ref 26.0–34.0)
MCHC: 26 g/dL — ABNORMAL LOW (ref 30.0–36.0)
MCV: 72.8 fL — ABNORMAL LOW (ref 78.0–100.0)
Platelets: 348 10*3/uL (ref 150–400)
RBC: 4.64 MIL/uL (ref 4.22–5.81)
RDW: 21.8 % — ABNORMAL HIGH (ref 11.5–15.5)
WBC: 12 10*3/uL — AB (ref 4.0–10.5)

## 2014-05-04 LAB — GLUCOSE, CAPILLARY
GLUCOSE-CAPILLARY: 132 mg/dL — AB (ref 70–99)
Glucose-Capillary: 117 mg/dL — ABNORMAL HIGH (ref 70–99)
Glucose-Capillary: 143 mg/dL — ABNORMAL HIGH (ref 70–99)

## 2014-05-04 LAB — COMPREHENSIVE METABOLIC PANEL
ALK PHOS: 69 U/L (ref 39–117)
ALT: 622 U/L — ABNORMAL HIGH (ref 0–53)
AST: 637 U/L — AB (ref 0–37)
Albumin: 2.6 g/dL — ABNORMAL LOW (ref 3.5–5.2)
Anion gap: 10 (ref 5–15)
BILIRUBIN TOTAL: 0.7 mg/dL (ref 0.3–1.2)
BUN: 14 mg/dL (ref 6–23)
CALCIUM: 8.5 mg/dL (ref 8.4–10.5)
CHLORIDE: 91 mmol/L — AB (ref 96–112)
CO2: 34 mmol/L — AB (ref 19–32)
CREATININE: 0.82 mg/dL (ref 0.50–1.35)
GFR calc Af Amer: >90 mL/min (ref >90)
GFR calc non Af Amer: >90 mL/min (ref >90)
Glucose, Bld: 129 mg/dL — ABNORMAL HIGH (ref 70–99)
Potassium: 3.4 mmol/L — ABNORMAL LOW (ref 3.5–5.1)
SODIUM: 135 mmol/L (ref 135–145)
Total Protein: 7.4 g/dL (ref 6.0–8.3)

## 2014-05-04 LAB — TROPONIN I
TROPONIN I: 0.03 ng/mL (ref <0.031)
Troponin I: <0.03 ng/mL (ref <0.031)
Troponin I: 0.04 ng/mL — ABNORMAL HIGH (ref <0.031)

## 2014-05-04 LAB — VITAMIN B12: VITAMIN B 12: 1012 pg/mL — AB (ref 211–911)

## 2014-05-04 LAB — FOLATE

## 2014-05-04 LAB — T4, FREE: Free T4: 0.71 ng/dL — ABNORMAL LOW (ref 0.80–1.80)

## 2014-05-04 LAB — TSH: TSH: 7.165 u[IU]/mL — ABNORMAL HIGH (ref 0.350–4.500)

## 2014-05-04 MED ORDER — LEVOTHYROXINE SODIUM 150 MCG PO TABS
150.0000 ug | ORAL_TABLET | Freq: Every day | ORAL | Status: DC
  Administered 2014-05-05 – 2014-05-10 (×6): 150 ug via ORAL
  Filled 2014-05-04 (×8): qty 1

## 2014-05-04 MED ORDER — FUROSEMIDE 10 MG/ML IJ SOLN
40.0000 mg | Freq: Four times a day (QID) | INTRAMUSCULAR | Status: DC
  Administered 2014-05-04 – 2014-05-05 (×3): 40 mg via INTRAVENOUS
  Filled 2014-05-04 (×6): qty 4

## 2014-05-04 MED ORDER — POTASSIUM CHLORIDE ER 10 MEQ PO TBCR
40.0000 meq | EXTENDED_RELEASE_TABLET | Freq: Two times a day (BID) | ORAL | Status: DC
  Administered 2014-05-04 – 2014-05-07 (×6): 40 meq via ORAL
  Filled 2014-05-04 (×7): qty 4

## 2014-05-04 MED ORDER — POTASSIUM CHLORIDE ER 10 MEQ PO TBCR
40.0000 meq | EXTENDED_RELEASE_TABLET | Freq: Every day | ORAL | Status: DC
  Filled 2014-05-04: qty 4

## 2014-05-04 MED ORDER — FERROUS SULFATE 325 (65 FE) MG PO TABS
325.0000 mg | ORAL_TABLET | Freq: Three times a day (TID) | ORAL | Status: DC
  Administered 2014-05-04 – 2014-05-10 (×17): 325 mg via ORAL
  Filled 2014-05-04 (×23): qty 1

## 2014-05-04 MED ORDER — FUROSEMIDE 10 MG/ML IJ SOLN: 40.0000 mg | Freq: Three times a day (TID) | INTRAMUSCULAR | Status: DC

## 2014-05-04 MED ORDER — SACCHAROMYCES BOULARDII 250 MG PO CAPS: 250.0000 mg | ORAL_CAPSULE | Freq: Two times a day (BID) | ORAL | Status: DC

## 2014-05-04 NOTE — Progress Notes (Addendum)
[facility] PROGRESS NOTE  Dean Bowers OMV:[id number] DOB: [birth year] DOA: 05/03/2014 PCP: Wyatt Haste, MD  Brief Summary  44 yo male with hx of hypertension, hyperlipidemia, Dm2, Copd (on home o2), severe OSA, on bipap, PE/DVT, polysubstance abuse-cocaine/THC apparently c/o increasing lower ext edema. Pt uncertain about weight gain. Denied fever, chills, cp, palp, n/v, diarrhea, brbpr, black stool but had sob with exertion. Slight orthopnea. Denies pnd. Pt presented to ED for evaluation and CXR showed pulmonary edema, with elevation in BNP , trop negative.  Started diuresis.  Per weights from previous admissions, his base weight is around 370-380 pounds. Currently he is up to 430.  Assessment/Plan  Acute on chronic respiratory failure due to probable acute on chronic diastolic heart failure, still wheezing considerably on exam.  He likely also has some right heart strain or failure secondary to his uncontrolled/in adequately treated severe obstructive sleep apnea.   -  Due to obesity, previous ECHO in January 2016 was of poor quality:  LV systolic function was probably normal, and diastolic function was deemed "probably normal."  Right heart could not be assessed and IVC looked "plethoric."  We will not attempt a repeat echocardiogram. -  1 troponin was elevated at 0.04, this was likely due to demand secondary to heart failure exacerbation or right heart strain -  Outpatient follow-up with cardiology -  ECHO not repeated  -  -1.8 L today -  Continue daily weights  -  Increase Lasix to 40 mg IV every 6 hours  -  Increase potassium to twice a day  -  Daily electrolytes including magnesium  -  Recommend transitioning to torsemide 40 mg by mouth twice a day prior to discharge and monitoring inpatient to determine whether he will maintain euvolemic and or continue to diuresis and this dose. He has been unsuccessful with Lasix 40 mg by mouth twice a day at home. -  Patient is  on 3L home O2 -  Tele:  NS[facility]  COPD on history but not on any medications  Lower extremity swelling, likely secondary to acute on chronic diastolic heart failure exacerbation although patient has known DVT and PE -  Patient states he has been compliant with his Xarelto -  Discontinue duplex lower extremity as this will not change management -  DVT and PE are felt to be secondary to postoperative bedrest and inactivity  Known PE/DVT -  Continue xarelto, started 3/23, continue BID through 4/12, then reduce to 20mg  once daily  Iron deficiency anemia may be due to recent procedures -  Iron level of 13 and iron saturation 3% in January of this year -  Occult stool  -  GI follow up   -  Repeat iron level to document baseline prior to starting therapy -  Started iron supplementation -  Folate, b12 were wnl  Hypokalemia due to diuresis -  Increase to BID potassium  Transaminitis, likely hepatic congestion -  Acute hepatitis panel -  Trend LFTs, anticipate they should improve with diuresis -  Discontinue tylenol  New diagnosis hypothyroidism -  TSH elevated and fT4 low -  Start levothyroxine 160mcg daily for now -  Repeat TSH in about 3 weeks for dose adjustment  Severe OSA  -  New bipap arriving today  Dm2,  -  A1c 6.5 - CBG stable -  Continue SSI   Right BKA s/p stump revisions -  Staples are supposed to be removed on 4/1 by Dr. Sharol Given -  D/c prophylactic  doxycycline - has been taking for 3 weeks now -  Monitor clinically for signs of infection  Obesity, BMI 64 -  Consider bariatric center referral  Diet:  Healthy heart  Access:  PIV IVF:  off Proph:  xarelto  Code Status: full Family Communication: pateint, his wife and daughter Disposition Plan: pending a lot of diuresis.  Weight is up 50-lbs from last month   Consultants:  none  Procedures:  CXR  Antibiotics:  doxycycline stopped 3/29  HPI/Subjective:  Still feeling SOB and asking to go off the  floor.  Wants me to push his diuresis as quickly as possible so he does not have to stay in the hospital as long this time.    Objective: Filed Vitals:   05/03/14 2134 05/04/14 0117 05/04/14 0503 05/04/14 1030  BP: 124/55  137/79 114/63  Pulse:  98 98 93  Temp: 98.2 F (36.8 C)  98.7 F (37.1 C)   TempSrc: Oral  Oral   Resp: 20 20 18 18   Height: 5\' 9"  (1.753 m)     Weight: 200.3 kg (441 lb 9.3 oz)  198.948 kg (438 lb 9.6 oz)   SpO2: 100% 94% 94% 84%    Intake/Output Summary (Last 24 hours) at 05/04/14 1337 Last data filed at 05/04/14 1201  Gross per 24 hour  Intake    720 ml  Output   3425 ml  Net  -2705 ml   Filed Weights   05/03/14 2134 05/04/14 0503  Weight: 200.3 kg (441 lb 9.3 oz) 198.948 kg (438 lb 9.6 oz)    Exam:   General:  Obese male, sitting upright, mild resp distress  HEENT:  NCAT, MMM  Cardiovascular:  RRR, nl S1, S2 no mrg, 2+ pulses, warm extremities  Respiratory:  Diminished high pitched full exp wheeze, no increased WOB  Abdomen:   NABS, soft, moderately distended, nontender  MSK:   Right stump wrapped, left leg 2+ slow pitting edema, left arm 2+ pitting edema  Neuro:  Grossly intact  Data Reviewed: Basic Metabolic Panel:  Recent Labs Lab 04/28/14 1642 05/03/14 1813 05/04/14 0420  NA 137 135 135  K 3.4* 3.2* 3.4*  CL 92* 89* 91*  CO2 38* 34* 34*  GLUCOSE 109* 112* 129*  BUN 14 16 14   CREATININE 0.81 0.98 0.82  CALCIUM 8.8 8.6 8.5   Liver Function Tests:  Recent Labs Lab 04/28/14 1642 05/04/14 0420  AST 45* 637*  ALT 36 622*  ALKPHOS 76 69  BILITOT 0.6 0.7  PROT 7.7 7.4  ALBUMIN 2.9* 2.6*   No results for input(s): LIPASE, AMYLASE in the last 168 hours. No results for input(s): AMMONIA in the last 168 hours. CBC:  Recent Labs Lab 04/28/14 1642 05/03/14 1813 05/04/14 0420  WBC 10.5 10.4 12.0*  NEUTROABS 8.0*  --   --   HGB 9.4* 9.1* 8.8*  HCT 34.3* 34.7* 33.8*  MCV 70.7* 71.3* 72.8*  PLT 277 374 348   Cardiac  Enzymes:  Recent Labs Lab 05/03/14 2324 05/04/14 0420 05/04/14 0952  TROPONINI <0.03 0.04* 0.03   BNP (last 3 results)  Recent Labs  03/28/14 0115 04/28/14 1919 05/03/14 1813  BNP 116.1* 371.5* 451.1*    ProBNP (last 3 results) No results for input(s): PROBNP in the last 8760 hours.  CBG:  Recent Labs Lab 05/03/14 2214 05/04/14 0650  GLUCAP 108* 117*    No results found for this or any previous visit (from the past 240 hour(s)).   Studies: Dg  Chest 2 View  05/03/2014   CLINICAL DATA:  44 year old male with acute shortness of breath.  EXAM: CHEST  2 VIEW  COMPARISON:  04/15/2014 and prior chest radiographs  FINDINGS: Cardiomegaly, pulmonary vascular congestion and interstitial pulmonary edema noted.  There is no evidence of pneumothorax.  A small to moderate right pleural effusion is noted.  No acute bony abnormalities are identified.  IMPRESSION: Cardiomegaly with interstitial pulmonary edema and small to moderate right pleural effusion.   Electronically Signed   By: Margarette Canada M.D.   On: 05/03/2014 20:26    Scheduled Meds: . aspirin EC  81 mg Oral Daily  . diltiazem  120 mg Oral Daily  . doxycycline  100 mg Oral Q12H  . ferrous sulfate  325 mg Oral TID WC  . furosemide  40 mg Intravenous BID  . gabapentin  600 mg Oral BID  . insulin aspart  0-5 Units Subcutaneous QHS  . insulin aspart  0-9 Units Subcutaneous TID WC  . potassium chloride  40 mEq Oral Daily  . Rivaroxaban  15 mg Oral BID WC  . [START ON 05/20/2014] rivaroxaban  20 mg Oral Q supper  . sodium chloride  3 mL Intravenous Q12H  . sodium chloride  3 mL Intravenous Q12H   Continuous Infusions:   Active Problems:   Diabetes mellitus without complication   Pulmonary edema   Hypokalemia   Anemia   CHF (congestive heart failure)    Time spent: 30 min    Alasia Enge, [facility] Pager [phone]. If 7PM-7AM, please contact night-coverage at [email or web address], password [facility] 05/04/2014,  1:37 PM  LOS: 1 day

## 2014-05-04 NOTE — Care Management Note (Signed)
    Page 1 of 1   05/10/2014     2:48:35 PM CARE MANAGEMENT NOTE 05/10/2014  Patient:  Dean Bowers, Dean Bowers   Account Number:  [id number]  Date Initiated:  05/04/2014  Documentation initiated by:  AMERSON,JULIE  Subjective/Objective Assessment:   Pt adm on 05/03/14 with pulmonary edema, CHF.  PTA, pt resided at home with family.     Action/Plan:   Will follow for dc needs as pt progresses.   Anticipated DC Date:  05/10/2014   Anticipated DC Plan:  [place]  CM consult      Choice offered to / List presented to:  C-1 Patient        [place] arranged  HH-1 RN  [place].   Status of service:  Completed, signed off Medicare Important Message given?  NO (If response is "NO", the following Medicare IM given date fields will be blank) Date Medicare IM given:   Medicare IM given by:   Date Additional Medicare IM given:   Additional Medicare IM given by:    Discharge Disposition:  [place]  Per UR Regulation:  Reviewed for med. necessity/level of care/duration of stay  If discussed at [place] of Stay Meetings, dates discussed:    Comments:  05/10/14 [place] RN MSN BSN CCM Pt will discharge home with home health RN.  Provided list of agencies, referral made to [facility] per pt's choice.  They will provide scale and enroll pt in their heart failure program.

## 2014-05-04 NOTE — Progress Notes (Signed)
Pt requested to be placed on Bipap at 2330. RT will return at that time.

## 2014-05-04 NOTE — Progress Notes (Signed)
1700 went out of unit via motorized wheelchair with wife and daughter.on cardiac monitor

## 2014-05-04 NOTE — Progress Notes (Signed)
*  Preliminary Results* Left lower extremity venous duplex completed. The study was technically limited due to patient body habitus, severe leg edema and limited acoustic penetration. Unable to visualize all of the left lower extremity veins with the exception of the left popliteal vein which appears to be patent. This study is essentially inconclusive.  05/04/2014 3:52 PM  Maudry Mayhew, RVT, RDCS, RDMS

## 2014-05-05 LAB — COMPREHENSIVE METABOLIC PANEL
ALT: 503 U/L — AB (ref 0–53)
AST: 376 U/L — ABNORMAL HIGH (ref 0–37)
Albumin: 2.8 g/dL — ABNORMAL LOW (ref 3.5–5.2)
Alkaline Phosphatase: 77 U/L (ref 39–117)
Anion gap: 6 (ref 5–15)
BUN: 15 mg/dL (ref 6–23)
CO2: 41 mmol/L (ref 19–32)
CREATININE: 0.78 mg/dL (ref 0.50–1.35)
Calcium: 8.4 mg/dL (ref 8.4–10.5)
Chloride: 91 mmol/L — ABNORMAL LOW (ref 96–112)
GLUCOSE: 111 mg/dL — AB (ref 70–99)
Potassium: 3.9 mmol/L (ref 3.5–5.1)
SODIUM: 138 mmol/L (ref 135–145)
Total Bilirubin: 0.8 mg/dL (ref 0.3–1.2)
Total Protein: 7.8 g/dL (ref 6.0–8.3)

## 2014-05-05 LAB — HEPATITIS PANEL, ACUTE
HCV Ab: NEGATIVE
HEP B C IGM: NONREACTIVE
HEP B S AG: NEGATIVE
Hep A IgM: NONREACTIVE

## 2014-05-05 LAB — GLUCOSE, CAPILLARY
Glucose-Capillary: 117 mg/dL — ABNORMAL HIGH (ref 70–99)
Glucose-Capillary: 113 mg/dL — ABNORMAL HIGH (ref 70–99)
Glucose-Capillary: 150 mg/dL — ABNORMAL HIGH (ref 70–99)
Glucose-Capillary: 158 mg/dL — ABNORMAL HIGH (ref 70–99)

## 2014-05-05 LAB — CBC
HCT: 32.7 % — ABNORMAL LOW (ref 39.0–52.0)
Hemoglobin: 8.2 g/dL — ABNORMAL LOW (ref 13.0–17.0)
MCH: 18.4 pg — AB (ref 26.0–34.0)
MCHC: 25.1 g/dL — AB (ref 30.0–36.0)
MCV: 73.3 fL — AB (ref 78.0–100.0)
PLATELETS: 354 10*3/uL (ref 150–400)
RBC: 4.46 MIL/uL (ref 4.22–5.81)
RDW: 21.6 % — ABNORMAL HIGH (ref 11.5–15.5)
WBC: 11.5 10*3/uL — ABNORMAL HIGH (ref 4.0–10.5)

## 2014-05-05 LAB — VITAMIN B12: Vitamin B-12: 802 pg/mL (ref 211–911)

## 2014-05-05 LAB — FOLATE: Folate: >20 ng/mL

## 2014-05-05 LAB — RETICULOCYTES
RBC.: 4.58 MIL/uL (ref 4.22–5.81)
Retic Count, Absolute: 105.3 10*3/uL (ref 19.0–186.0)
Retic Ct Pct: 2.3 % (ref 0.4–3.1)

## 2014-05-05 LAB — OCCULT BLOOD X 1 CARD TO LAB, STOOL: Fecal Occult Bld: POSITIVE — AB

## 2014-05-05 LAB — HEMOGLOBIN A1C
HEMOGLOBIN A1C: 6.3 % — AB (ref 4.8–5.6)
Mean Plasma Glucose: 134 mg/dL

## 2014-05-05 LAB — MAGNESIUM: Magnesium: 1.8 mg/dL (ref 1.5–2.5)

## 2014-05-05 LAB — FERRITIN: FERRITIN: 15 ng/mL — AB (ref 22–322)

## 2014-05-05 MED ORDER — CETYLPYRIDINIUM CHLORIDE 0.05 % MT LIQD
7.0000 mL | Freq: Two times a day (BID) | OROMUCOSAL | Status: DC
  Administered 2014-05-05 – 2014-05-10 (×10): 7 mL via OROMUCOSAL

## 2014-05-05 MED ORDER — SODIUM CHLORIDE 0.9 % IJ SOLN
10.0000 mL | INTRAMUSCULAR | Status: DC | PRN
  Administered 2014-05-06: 10 mL
  Filled 2014-05-05: qty 40

## 2014-05-05 MED ORDER — FUROSEMIDE 10 MG/ML IJ SOLN
4.0000 mg/h | INTRAVENOUS | Status: DC
  Administered 2014-05-05: 8 mg/h via INTRAVENOUS
  Administered 2014-05-08: 10 mg/h via INTRAVENOUS
  Administered 2014-05-08: 20 mg/h via INTRAVENOUS
  Filled 2014-05-05 (×9): qty 25

## 2014-05-05 NOTE — Progress Notes (Signed)
Patient asked RT to connect O2 (3Lpm) to his home BIPAP machine. RT connected and patient applied the Bipap with no other assistance. RT will continue to monitor.

## 2014-05-05 NOTE — Progress Notes (Signed)
[facility] PROGRESS NOTE Interim History: 44 yo male with hx of hypertension, hyperlipidemia, Dm2, Copd (on home o2), severe OSA, on bipap, PE/DVT, polysubstance abuse-cocaine/THC apparently c/o increasing lower ext edema. Pt uncertain about weight gain. Denied fever, chills, cp, palp, n/v, diarrhea, brbpr, black stool but had sob with exertion. Slight orthopnea. Denies pnd. Pt presented to ED for evaluation and CXR showed pulmonary edema, with elevation in BNP , trop negative. Started diuresis. Per weights from previous admissions, his base weight is around 370-380 pounds. Currently he is up to 430. Filed Weights   05/03/14 2134 05/04/14 0503 05/05/14 0605  Weight: 200.3 kg (441 lb 9.3 oz) 198.948 kg (438 lb 9.6 oz) 198.993 kg (438 lb 11.2 oz)        Intake/Output Summary (Last 24 hours) at 05/05/14 0830 Last data filed at 05/05/14 0600  Gross per 24 hour  Intake    480 ml  Output   5125 ml  Net  -4645 ml     Assessment/Plan: Acute on chronic respiratory failure with hypoxemia due to  Acute on chronic diastolic heart failure/  *Acute on chronic respiratory failure with hypoxemia: - Due to fluid none compliance. - Echocardiogram on 1 2016 poor quality showed LV systolic function is probably normal and probable diastolic dysfunction. - On admission there was a mild elevation in troponins likely secondary to heart failure exacerbation. - Estimated dry weight 170-175 kg. She is a 198.9 kg. His blood pressure stable. - Change to IV Lasix infusion. Monitor electrolytes. - Insert PICC line  Lower extremity swelling likely secondary to acute on chronic diastolic heart failure: - The patient has a known history of DVT and PE, is been compliant with his Xarelto - Lower extremity DVT was felt to be secondary to inactivity and bed rest after surgery.  Diabetes mellitus without complication - M0N is 6.3 crit control continue current therapy.  Obesity, Class III, BMI 40-49.9  (morbid obesity): - Counseling.  Iron deficiency anemia: - Follow-up with GI as an outpatient. Check an anemia panel.  Elevated hepatic function: - Due to acute onset or failure.  New diagnosis of hypothyroidism: - High TSH and low free T4 continue Synthroid recheck in 6 weeks.  Hypokalemia - Monitor and replete as needed.  Severe  OSA (obstructive sleep apnea) - Continue BiPAP.  History of right BKA status post stump revision: Continue to monitor.    Code Status: full Family Communication: pateint, his wife and daughter Disposition Plan: pending a lot of diuresis. Weight is up 50-lbs from last month   Consultants:  none  Procedures: ECHO: none CXR  Antibiotics:  Doxy 3.29.2016  HPI/Subjective: He relates his breathing is better.  Objective: Filed Vitals:   05/04/14 2025 05/05/14 0226 05/05/14 0605 05/05/14 0812  BP: 130/50 120/68 100/74 117/76  Pulse: 101 95 99 76  Temp: 98.7 F (37.1 C) 98.2 F (36.8 C) 98.1 F (36.7 C)   TempSrc: Oral Oral Oral   Resp: 20 20 20 18   Height:      Weight:   198.993 kg (438 lb 11.2 oz)   SpO2: 98% 95% 95% 96%     Exam:  General: Alert, awake, oriented x3, in no acute distress.  HEENT: No bruits, no goiter.  Heart: Regular rate and rhythm, no appreciated JVD 3+ edema in his lower extremities. Lungs: Good air movement, clear Abdomen: Soft, nontender, nondistended, positive bowel sounds.    Data Reviewed: Basic Metabolic Panel:  Recent Labs Lab 04/28/14 1642 05/03/14 1813  05/04/14 0420 05/05/14 0310  NA 137 135 135 138  K 3.4* 3.2* 3.4* 3.9  CL 92* 89* 91* 91*  CO2 38* 34* 34* 41*  GLUCOSE 109* 112* 129* 111*  BUN 14 16 14 15   CREATININE 0.81 0.98 0.82 0.78  CALCIUM 8.8 8.6 8.5 8.4  MG  --   --   --  1.8   Liver Function Tests:  Recent Labs Lab 04/28/14 1642 05/04/14 0420 05/05/14 0310  AST 45* 637* 376*  ALT 36 622* 503*  ALKPHOS 76 69 77  BILITOT 0.6 0.7 0.8  PROT 7.7 7.4 7.8  ALBUMIN  2.9* 2.6* 2.8*   No results for input(s): LIPASE, AMYLASE in the last 168 hours. No results for input(s): AMMONIA in the last 168 hours. CBC:  Recent Labs Lab 04/28/14 1642 05/03/14 1813 05/04/14 0420 05/05/14 0310  WBC 10.5 10.4 12.0* 11.5*  NEUTROABS 8.0*  --   --   --   HGB 9.4* 9.1* 8.8* 8.2*  HCT 34.3* 34.7* 33.8* 32.7*  MCV 70.7* 71.3* 72.8* 73.3*  PLT 277 374 348 354   Cardiac Enzymes:  Recent Labs Lab 05/03/14 2324 05/04/14 0420 05/04/14 0952  TROPONINI <0.03 0.04* 0.03   BNP (last 3 results)  Recent Labs  03/28/14 0115 04/28/14 1919 05/03/14 1813  BNP 116.1* 371.5* 451.1*    ProBNP (last 3 results) No results for input(s): PROBNP in the last 8760 hours.  CBG:  Recent Labs Lab 05/03/14 2214 05/04/14 0650 05/04/14 1633 05/04/14 2051 05/05/14 0643  GLUCAP 108* 117* 132* 143* 117*    No results found for this or any previous visit (from the past 240 hour(s)).   Studies: Dg Chest 2 View  05/03/2014   CLINICAL DATA:  44 year old male with acute shortness of breath.  EXAM: CHEST  2 VIEW  COMPARISON:  04/15/2014 and prior chest radiographs  FINDINGS: Cardiomegaly, pulmonary vascular congestion and interstitial pulmonary edema noted.  There is no evidence of pneumothorax.  A small to moderate right pleural effusion is noted.  No acute bony abnormalities are identified.  IMPRESSION: Cardiomegaly with interstitial pulmonary edema and small to moderate right pleural effusion.   Electronically Signed   By: Margarette Canada M.D.   On: 05/03/2014 20:26    Scheduled Meds: . aspirin EC  81 mg Oral Daily  . diltiazem  120 mg Oral Daily  . ferrous sulfate  325 mg Oral TID WC  . furosemide  40 mg Intravenous Q6H  . gabapentin  600 mg Oral BID  . insulin aspart  0-5 Units Subcutaneous QHS  . insulin aspart  0-9 Units Subcutaneous TID WC  . levothyroxine  150 mcg Oral QAC breakfast  . potassium chloride  40 mEq Oral BID  . Rivaroxaban  15 mg Oral BID WC  . [START  ON 05/20/2014] rivaroxaban  20 mg Oral Q supper  . sodium chloride  3 mL Intravenous Q12H  . sodium chloride  3 mL Intravenous Q12H   Continuous Infusions:    Charlynne Cousins  [facility] Pager [phone] If 7PM-7AM, please contact night-coverage at [email or web address], password [facility] 05/05/2014, 8:30 AM  LOS: 2 days

## 2014-05-05 NOTE — Progress Notes (Signed)
Home unit in room, did not see pt.

## 2014-05-05 NOTE — Progress Notes (Signed)
Peripherally Inserted Central Catheter/Midline Placement  The IV Nurse has discussed with the patient and/or persons authorized to consent for the patient, the purpose of this procedure and the potential benefits and risks involved with this procedure.  The benefits include less needle sticks, lab draws from the catheter and patient may be discharged home with the catheter.  Risks include, but not limited to, infection, bleeding, blood clot (thrombus formation), and puncture of an artery; nerve damage and irregular heat beat.  Alternatives to this procedure were also discussed.  PICC/Midline Placement Documentation        Dean Bowers 05/05/2014, 3:30 PM

## 2014-05-06 DIAGNOSIS — R946 Abnormal results of thyroid function studies: Secondary | ICD-10-CM

## 2014-05-06 LAB — IRON AND TIBC
IRON: 27 ug/dL — AB (ref 42–165)
SATURATION RATIOS: 7 % — AB (ref 20–55)
TIBC: 401 ug/dL (ref 215–435)
UIBC: 374 ug/dL (ref 125–400)

## 2014-05-06 LAB — CBC
HCT: 32.6 % — ABNORMAL LOW (ref 39.0–52.0)
HEMOGLOBIN: 8 g/dL — AB (ref 13.0–17.0)
MCH: 18 pg — AB (ref 26.0–34.0)
MCHC: 24.5 g/dL — AB (ref 30.0–36.0)
MCV: 73.4 fL — ABNORMAL LOW (ref 78.0–100.0)
PLATELETS: 340 10*3/uL (ref 150–400)
RBC: 4.44 MIL/uL (ref 4.22–5.81)
RDW: 21.8 % — AB (ref 11.5–15.5)
WBC: 9.8 10*3/uL (ref 4.0–10.5)

## 2014-05-06 LAB — COMPREHENSIVE METABOLIC PANEL
ALT: 353 U/L — AB (ref 0–53)
AST: 206 U/L — ABNORMAL HIGH (ref 0–37)
Albumin: 2.5 g/dL — ABNORMAL LOW (ref 3.5–5.2)
Alkaline Phosphatase: 72 U/L (ref 39–117)
Anion gap: 10 (ref 5–15)
BUN: 12 mg/dL (ref 6–23)
CO2: 38 mmol/L — ABNORMAL HIGH (ref 19–32)
CREATININE: 0.67 mg/dL (ref 0.50–1.35)
Calcium: 8.4 mg/dL (ref 8.4–10.5)
Chloride: 90 mmol/L — ABNORMAL LOW (ref 96–112)
Glucose, Bld: 114 mg/dL — ABNORMAL HIGH (ref 70–99)
Potassium: 3.3 mmol/L — ABNORMAL LOW (ref 3.5–5.1)
SODIUM: 138 mmol/L (ref 135–145)
Total Bilirubin: 0.7 mg/dL (ref 0.3–1.2)
Total Protein: 6.9 g/dL (ref 6.0–8.3)

## 2014-05-06 LAB — GLUCOSE, CAPILLARY
Glucose-Capillary: 130 mg/dL — ABNORMAL HIGH (ref 70–99)
Glucose-Capillary: 93 mg/dL (ref 70–99)
Glucose-Capillary: 104 mg/dL — ABNORMAL HIGH (ref 70–99)
Glucose-Capillary: 91 mg/dL (ref 70–99)

## 2014-05-06 LAB — MAGNESIUM: Magnesium: 1.8 mg/dL (ref 1.5–2.5)

## 2014-05-06 MED ORDER — POTASSIUM CHLORIDE CRYS ER 20 MEQ PO TBCR
40.0000 meq | EXTENDED_RELEASE_TABLET | Freq: Two times a day (BID) | ORAL | Status: AC
  Administered 2014-05-06 (×2): 40 meq via ORAL
  Filled 2014-05-06 (×2): qty 2

## 2014-05-06 NOTE — Progress Notes (Signed)
PT Cancellation Note  Patient Details Name: Dean Bowers MRN: [id number] DOB: December 18, 1970   Cancelled Treatment:    Reason Eval/Treat Not Completed: PT screened, no needs identified, will sign off. Pt is independent with transfers to power scooter. Feels confident with return to new home which is much more w/c accessible than previous home.   Elenna Spratling 05/06/2014, 9:34 AM  [place]

## 2014-05-06 NOTE — Progress Notes (Signed)
Tech offered Pt a bath. Pt stated he is waiting on his wife to bring some items and will do bath when she arrives.

## 2014-05-06 NOTE — Progress Notes (Signed)
[facility] PROGRESS NOTE Interim History: 44 yo male with hx of hypertension, hyperlipidemia, Dm2, Copd (on home o2), severe OSA, on bipap, PE/DVT, polysubstance abuse-cocaine/THC apparently c/o increasing lower ext edema. Pt uncertain about weight gain. Denied fever, chills, cp, palp, n/v, diarrhea, brbpr, black stool but had sob with exertion. Slight orthopnea. Denies pnd. Pt presented to ED for evaluation and CXR showed pulmonary edema, with elevation in BNP , trop negative. Started diuresis. Per weights from previous admissions, his base weight is around 370-380 pounds. Currently he is up to 430. Filed Weights   05/04/14 0503 05/05/14 0605 05/06/14 0553  Weight: 198.948 kg (438 lb 9.6 oz) 198.993 kg (438 lb 11.2 oz) 198.449 kg (437 lb 8 oz)        Intake/Output Summary (Last 24 hours) at 05/06/14 9735 Last data filed at 05/06/14 3299  Gross per 24 hour  Intake    960 ml  Output   2650 ml  Net  -1690 ml     Assessment/Plan: Acute on chronic respiratory failure with hypoxemia due to  Acute on chronic diastolic heart failure/  *Acute on chronic respiratory failure with hypoxemia: - Due to fluid none compliance. - Estimated dry weight 170-175 kg. She is a 198.9 kg. His blood pressure stable. - Increase IV Lasix he's had moderate diuresis, monitor electrode lites and replete as needed. Discontinue telemetry.  Lower extremity swelling likely secondary to acute on chronic diastolic heart failure: - The patient has a known history of DVT and PE, is been compliant with his Xarelto - Lower extremity DVT was felt to be secondary to inactivity and bed rest after surgery.  Diabetes mellitus without complication - M4Q is 6.3 crit control continue current therapy.  Obesity, Class III, BMI 40-49.9 (morbid obesity): - Counseling.  Iron deficiency anemia: - Follow-up with GI as an outpatient.  - Ferritin of 15, will start him on oral iron therapy.  Elevated hepatic  function: - Due to acute onset or failure.  New diagnosis of hypothyroidism: - High TSH and low free T4 continue Synthroid recheck in 6 weeks.  Hypokalemia - Monitor and replete as needed.  Severe  OSA (obstructive sleep apnea) - Continue BiPAP.  History of right BKA status post stump revision: Continue to monitor.    Code Status: full Family Communication: pateint, his wife and daughter Disposition Plan: pending a lot of diuresis. Weight is up 50-lbs from last month   Consultants:  none  Procedures: - Echocardiogram on 1 2016 poor quality showed LV systolic function is probably normal and probable diastolic dysfunction. CXR  Antibiotics:  Doxy 3.29.2016  HPI/Subjective: No complaints was secured home.  Objective: Filed Vitals:   05/05/14 2012 05/06/14 0217 05/06/14 0553 05/06/14 0745  BP: 150/87 128/67 101/75 137/87  Pulse: 112 62 101 84  Temp: 97.5 F (36.4 C) 97.9 F (36.6 C) 97.9 F (36.6 C) 98.6 F (37 C)  TempSrc: Oral Oral Oral Oral  Resp: 16 20 20 16   Height:      Weight:   198.449 kg (437 lb 8 oz)   SpO2: 92%  96% 100%     Exam:  General: Alert, awake, oriented x3, in no acute distress.  HEENT: No bruits, no goiter.  Heart: Regular rate and rhythm, no appreciated JVD 3+ edema in his lower extremities. Lungs: Good air movement, clear Abdomen: Soft, nontender, nondistended, positive bowel sounds.    Data Reviewed: Basic Metabolic Panel:  Recent Labs Lab 05/03/14 1813 05/04/14 0420 05/05/14 0310 05/06/14  0512  NA 135 135 138 138  K 3.2* 3.4* 3.9 3.3*  CL 89* 91* 91* 90*  CO2 34* 34* 41* 38*  GLUCOSE 112* 129* 111* 114*  BUN 16 14 15 12   CREATININE 0.98 0.82 0.78 0.67  CALCIUM 8.6 8.5 8.4 8.4  MG  --   --  1.8 1.8   Liver Function Tests:  Recent Labs Lab 05/04/14 0420 05/05/14 0310 05/06/14 0512  AST 637* 376* 206*  ALT 622* 503* 353*  ALKPHOS 69 77 72  BILITOT 0.7 0.8 0.7  PROT 7.4 7.8 6.9  ALBUMIN 2.6* 2.8* 2.5*    No results for input(s): LIPASE, AMYLASE in the last 168 hours. No results for input(s): AMMONIA in the last 168 hours. CBC:  Recent Labs Lab 05/03/14 1813 05/04/14 0420 05/05/14 0310 05/06/14 0512  WBC 10.4 12.0* 11.5* 9.8  HGB 9.1* 8.8* 8.2* 8.0*  HCT 34.7* 33.8* 32.7* 32.6*  MCV 71.3* 72.8* 73.3* 73.4*  PLT 374 348 354 340   Cardiac Enzymes:  Recent Labs Lab 05/03/14 2324 05/04/14 0420 05/04/14 0952  TROPONINI <0.03 0.04* 0.03   BNP (last 3 results)  Recent Labs  03/28/14 0115 04/28/14 1919 05/03/14 1813  BNP 116.1* 371.5* 451.1*    ProBNP (last 3 results) No results for input(s): PROBNP in the last 8760 hours.  CBG:  Recent Labs Lab 05/05/14 0643 05/05/14 1325 05/05/14 1618 05/05/14 2125 05/06/14 0604  GLUCAP 117* 113* 150* 158* 130*    No results found for this or any previous visit (from the past 240 hour(s)).   Studies: No results found.  Scheduled Meds: . antiseptic oral rinse  7 mL Mouth Rinse BID  . aspirin EC  81 mg Oral Daily  . diltiazem  120 mg Oral Daily  . ferrous sulfate  325 mg Oral TID WC  . gabapentin  600 mg Oral BID  . insulin aspart  0-5 Units Subcutaneous QHS  . insulin aspart  0-9 Units Subcutaneous TID WC  . levothyroxine  150 mcg Oral QAC breakfast  . potassium chloride  40 mEq Oral BID  . potassium chloride  40 mEq Oral BID  . Rivaroxaban  15 mg Oral BID WC  . [START ON 05/20/2014] rivaroxaban  20 mg Oral Q supper  . sodium chloride  3 mL Intravenous Q12H  . sodium chloride  3 mL Intravenous Q12H   Continuous Infusions: . furosemide (LASIX) infusion 8 mg/hr (05/05/14 1605)     Charlynne Cousins  [facility] Pager [phone] If 7PM-7AM, please contact night-coverage at [email or web address], password [facility] 05/06/2014, 8:26 AM  LOS: 3 days

## 2014-05-07 ENCOUNTER — Encounter (HOSPITAL_COMMUNITY): Payer: Self-pay | Admitting: Cardiology

## 2014-05-07 DIAGNOSIS — I2699 Other pulmonary embolism without acute cor pulmonale: Secondary | ICD-10-CM | POA: Diagnosis present

## 2014-05-07 DIAGNOSIS — Z7901 Long term (current) use of anticoagulants: Secondary | ICD-10-CM

## 2014-05-07 HISTORY — DX: Long term (current) use of anticoagulants: Z79.01

## 2014-05-07 LAB — CBC
HEMATOCRIT: 32.5 % — AB (ref 39.0–52.0)
Hemoglobin: 8.1 g/dL — ABNORMAL LOW (ref 13.0–17.0)
MCH: 18.2 pg — ABNORMAL LOW (ref 26.0–34.0)
MCHC: 24.9 g/dL — ABNORMAL LOW (ref 30.0–36.0)
MCV: 72.9 fL — AB (ref 78.0–100.0)
Platelets: 292 10*3/uL (ref 150–400)
RBC: 4.46 MIL/uL (ref 4.22–5.81)
RDW: 22.2 % — ABNORMAL HIGH (ref 11.5–15.5)
WBC: 9.8 10*3/uL (ref 4.0–10.5)

## 2014-05-07 LAB — COMPREHENSIVE METABOLIC PANEL
ALBUMIN: 2.7 g/dL — AB (ref 3.5–5.2)
ALT: 255 U/L — ABNORMAL HIGH (ref 0–53)
ANION GAP: 5 (ref 5–15)
AST: 117 U/L — AB (ref 0–37)
Alkaline Phosphatase: 71 U/L (ref 39–117)
BUN: 10 mg/dL (ref 6–23)
CALCIUM: 8.2 mg/dL — AB (ref 8.4–10.5)
CHLORIDE: 91 mmol/L — AB (ref 96–112)
CO2: 42 mmol/L (ref 19–32)
CREATININE: 0.67 mg/dL (ref 0.50–1.35)
GFR calc Af Amer: >90 mL/min (ref >90)
GFR calc non Af Amer: >90 mL/min (ref >90)
Glucose, Bld: 100 mg/dL — ABNORMAL HIGH (ref 70–99)
Potassium: 3.6 mmol/L (ref 3.5–5.1)
Sodium: 138 mmol/L (ref 135–145)
TOTAL PROTEIN: 7.6 g/dL (ref 6.0–8.3)
Total Bilirubin: 0.6 mg/dL (ref 0.3–1.2)

## 2014-05-07 LAB — GLUCOSE, CAPILLARY
Glucose-Capillary: 101 mg/dL — ABNORMAL HIGH (ref 70–99)
Glucose-Capillary: 104 mg/dL — ABNORMAL HIGH (ref 70–99)
Glucose-Capillary: 185 mg/dL — ABNORMAL HIGH (ref 70–99)
Glucose-Capillary: 121 mg/dL — ABNORMAL HIGH (ref 70–99)

## 2014-05-07 LAB — RAPID URINE DRUG SCREEN, HOSP PERFORMED
Amphetamines: NOT DETECTED
Barbiturates: NOT DETECTED
Benzodiazepines: NOT DETECTED
Cocaine: NOT DETECTED
Opiates: NOT DETECTED
TETRAHYDROCANNABINOL: NOT DETECTED

## 2014-05-07 LAB — MAGNESIUM: Magnesium: 1.8 mg/dL (ref 1.5–2.5)

## 2014-05-07 LAB — CARBOXYHEMOGLOBIN
CARBOXYHEMOGLOBIN: 7.2 % — AB (ref 0.5–1.5)
Methemoglobin: 1 % (ref 0.0–1.5)
O2 Saturation: 74.4 %
Total hemoglobin: 8.6 g/dL — ABNORMAL LOW (ref 13.5–18.0)

## 2014-05-07 LAB — MRSA PCR SCREENING: MRSA BY PCR: NEGATIVE

## 2014-05-07 MED ORDER — POTASSIUM CHLORIDE ER 10 MEQ PO TBCR
40.0000 meq | EXTENDED_RELEASE_TABLET | Freq: Three times a day (TID) | ORAL | Status: DC
  Administered 2014-05-07 (×2): 40 meq via ORAL
  Filled 2014-05-07 (×6): qty 4

## 2014-05-07 MED ORDER — POTASSIUM CHLORIDE CRYS ER 20 MEQ PO TBCR
40.0000 meq | EXTENDED_RELEASE_TABLET | Freq: Two times a day (BID) | ORAL | Status: DC
  Administered 2014-05-07: 40 meq via ORAL

## 2014-05-07 MED ORDER — METOLAZONE 2.5 MG PO TABS: 2.5000 mg | ORAL_TABLET | Freq: Every day | ORAL | Status: DC

## 2014-05-07 MED ORDER — METOLAZONE 5 MG PO TABS
5.0000 mg | ORAL_TABLET | Freq: Every day | ORAL | Status: DC
  Administered 2014-05-07: 5 mg via ORAL
  Filled 2014-05-07: qty 1

## 2014-05-07 MED ORDER — METOLAZONE 5 MG PO TABS
5.0000 mg | ORAL_TABLET | Freq: Two times a day (BID) | ORAL | Status: DC
  Administered 2014-05-07 – 2014-05-08 (×3): 5 mg via ORAL
  Filled 2014-05-07 (×7): qty 1

## 2014-05-07 NOTE — Progress Notes (Signed)
Repot given to [facility] 2h 20

## 2014-05-07 NOTE — Progress Notes (Signed)
[facility] PROGRESS NOTE Interim History: 44 yo male with hx of hypertension, hyperlipidemia, Dm2, Copd (on home o2), severe OSA, on bipap, PE/DVT, polysubstance abuse-cocaine/THC apparently c/o increasing lower ext edema. Pt uncertain about weight gain. Denied fever, chills, cp, palp, n/v, diarrhea, brbpr, black stool but had sob with exertion. Slight orthopnea. Denies pnd. Pt presented to ED for evaluation and CXR showed pulmonary edema, with elevation in BNP , trop negative. Started diuresis. Per weights from previous admissions, his base weight is around 370-380 pounds. Currently he is up to 430. Filed Weights   05/05/14 0605 05/06/14 0553 05/07/14 0526  Weight: 198.993 kg (438 lb 11.2 oz) 198.449 kg (437 lb 8 oz) 199.4 kg (439 lb 9.6 oz)        Intake/Output Summary (Last 24 hours) at 05/07/14 6433 Last data filed at 05/07/14 2951  Gross per 24 hour  Intake   1230 ml  Output   4625 ml  Net  -3395 ml     Assessment/Plan: Acute on chronic respiratory failure with hypoxemia due to  Acute on chronic diastolic heart failure/  *Acute on chronic respiratory failure with hypoxemia: - Due to fluid none compliance. - Estimated dry weight 170-175 kg. She is a 198.9 kg. His blood pressure stable. - On  IV Lasix ggt and metolazone, monitor electrode lites and replete as needed. - Diuresis slows down his weight has not improve will consult cardiology. - Probably has some degree of pulmonary hypertension, does not were C-paphome.  Lower extremity swelling likely secondary to acute on chronic diastolic heart failure: - The patient has a known history of DVT and PE, is been compliant with his Xarelto - Lower extremity DVT was felt to be secondary to inactivity and bed rest after surgery.  Diabetes mellitus without complication - O8C is 6.3 cont. control continue current therapy.  Transaminatis - due to passive congestion, improving with diuresis.  Obesity, Class III, BMI  40-49.9 (morbid obesity): - Counseling.  Iron deficiency anemia: - Follow-up with GI as an outpatient.  - Ferritin of 15, will start him on IV iron therapy.  Elevated hepatic function: - Due to acute onset or failure.  New diagnosis of hypothyroidism: - High TSH and low free T4 continue Synthroid recheck in 6 weeks.  Hypokalemia - Monitor and replete as needed.  Severe  OSA (obstructive sleep apnea) - Continue BiPAP.  History of right BKA status post stump revision: Continue to monitor.    Code Status: full Family Communication: pateint, his wife and daughter Disposition Plan: pending a lot of diuresis. Weight is up 50-lbs from last month   Consultants:  none  Procedures: - Echocardiogram on 1 2016 poor quality showed LV systolic function is probably normal and probable diastolic dysfunction. CXR  Antibiotics:  Doxy 3.29.2016  HPI/Subjective: No complaints was to go home.  Objective: Filed Vitals:   05/06/14 1300 05/06/14 2014 05/07/14 0526 05/07/14 0933  BP: 139/56 125/66 147/73 131/55  Pulse: 97 91 99 95  Temp:  97.7 F (36.5 C) 98.1 F (36.7 C) 98.4 F (36.9 C)  TempSrc:  Oral Oral Oral  Resp: 16 18 18 20   Height:      Weight:   199.4 kg (439 lb 9.6 oz)   SpO2: 100% 99% 100% 100%     Exam:  General: Alert, awake, oriented x3, in no acute distress.  HEENT: No bruits, no goiter.  Heart: Regular rate and rhythm, no appreciated JVD 3+ edema in his lower extremities. Lungs: Good  air movement, clear Abdomen: Soft, nontender, nondistended, positive bowel sounds.    Data Reviewed: Basic Metabolic Panel:  Recent Labs Lab 05/03/14 1813 05/04/14 0420 05/05/14 0310 05/06/14 0512 05/07/14 0550  NA 135 135 138 138 138  K 3.2* 3.4* 3.9 3.3* 3.6  CL 89* 91* 91* 90* 91*  CO2 34* 34* 41* 38* 42*  GLUCOSE 112* 129* 111* 114* 100*  BUN 16 14 15 12 10   CREATININE 0.98 0.82 0.78 0.67 0.67  CALCIUM 8.6 8.5 8.4 8.4 8.2*  MG  --   --  1.8 1.8 1.8    Liver Function Tests:  Recent Labs Lab 05/04/14 0420 05/05/14 0310 05/06/14 0512 05/07/14 0550  AST 637* 376* 206* 117*  ALT 622* 503* 353* 255*  ALKPHOS 69 77 72 71  BILITOT 0.7 0.8 0.7 0.6  PROT 7.4 7.8 6.9 7.6  ALBUMIN 2.6* 2.8* 2.5* 2.7*   No results for input(s): LIPASE, AMYLASE in the last 168 hours. No results for input(s): AMMONIA in the last 168 hours. CBC:  Recent Labs Lab 05/03/14 1813 05/04/14 0420 05/05/14 0310 05/06/14 0512 05/07/14 0550  WBC 10.4 12.0* 11.5* 9.8 9.8  HGB 9.1* 8.8* 8.2* 8.0* 8.1*  HCT 34.7* 33.8* 32.7* 32.6* 32.5*  MCV 71.3* 72.8* 73.3* 73.4* 72.9*  PLT 374 348 354 340 292   Cardiac Enzymes:  Recent Labs Lab 05/03/14 2324 05/04/14 0420 05/04/14 0952  TROPONINI <0.03 0.04* 0.03   BNP (last 3 results)  Recent Labs  03/28/14 0115 04/28/14 1919 05/03/14 1813  BNP 116.1* 371.5* 451.1*    ProBNP (last 3 results) No results for input(s): PROBNP in the last 8760 hours.  CBG:  Recent Labs Lab 05/06/14 0604 05/06/14 1222 05/06/14 1613 05/06/14 2129 05/07/14 0614  GLUCAP 130* 93 104* 91 101*    No results found for this or any previous visit (from the past 240 hour(s)).   Studies: No results found.  Scheduled Meds: . antiseptic oral rinse  7 mL Mouth Rinse BID  . aspirin EC  81 mg Oral Daily  . diltiazem  120 mg Oral Daily  . ferrous sulfate  325 mg Oral TID WC  . gabapentin  600 mg Oral BID  . insulin aspart  0-5 Units Subcutaneous QHS  . insulin aspart  0-9 Units Subcutaneous TID WC  . levothyroxine  150 mcg Oral QAC breakfast  . metolazone  5 mg Oral Daily  . potassium chloride  40 mEq Oral BID  . potassium chloride  40 mEq Oral BID  . Rivaroxaban  15 mg Oral BID WC  . [START ON 05/20/2014] rivaroxaban  20 mg Oral Q supper  . sodium chloride  3 mL Intravenous Q12H  . sodium chloride  3 mL Intravenous Q12H   Continuous Infusions: . furosemide (LASIX) infusion 10 mg/hr (05/06/14 0841)     Charlynne Cousins  [facility] Pager [phone] If 7PM-7AM, please contact night-coverage at [email or web address], password [facility] 05/07/2014, 9:39 AM  LOS: 4 days
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
## 2014-05-07 NOTE — Progress Notes (Signed)
CRITICAL VALUE ALERT  Critical value received:  Co-ox 7.2  Date of notification:  05/07/2014  Time of notification:  2100  Critical value read back:Yes.    Nurse who received alert:  Blase Mess, RN  MD notified (1st page):  Rogue Bussing, MD  Time of first page:  2105  MD notified (2nd page):  Time of second page:  Responding MD:  Rogue Bussing, MD  Time MD responded:  2106

## 2014-05-08 DIAGNOSIS — D509 Iron deficiency anemia, unspecified: Secondary | ICD-10-CM

## 2014-05-08 DIAGNOSIS — I50810 Right heart failure, unspecified: Secondary | ICD-10-CM | POA: Insufficient documentation

## 2014-05-08 LAB — POCT I-STAT 3, ART BLOOD GAS (G3+)
Acid-Base Excess: 24 mmol/L — ABNORMAL HIGH (ref 0.0–2.0)
Bicarbonate: 54.5 mEq/L — ABNORMAL HIGH (ref 20.0–24.0)
O2 SAT: 93 %
PO2 ART: 76 mmHg — AB (ref 80.0–100.0)
pCO2 arterial: 98.9 mmHg (ref 35.0–45.0)
pH, Arterial: 7.35 (ref 7.350–7.450)

## 2014-05-08 LAB — URINE MICROSCOPIC-ADD ON

## 2014-05-08 LAB — GLUCOSE, CAPILLARY
GLUCOSE-CAPILLARY: 91 mg/dL (ref 70–99)
Glucose-Capillary: 108 mg/dL — ABNORMAL HIGH (ref 70–99)
Glucose-Capillary: 108 mg/dL — ABNORMAL HIGH (ref 70–99)
Glucose-Capillary: 113 mg/dL — ABNORMAL HIGH (ref 70–99)
Glucose-Capillary: 114 mg/dL — ABNORMAL HIGH (ref 70–99)

## 2014-05-08 LAB — COMPREHENSIVE METABOLIC PANEL
ALK PHOS: 73 U/L (ref 39–117)
ALT: 201 U/L — ABNORMAL HIGH (ref 0–53)
AST: 80 U/L — AB (ref 0–37)
Albumin: 2.7 g/dL — ABNORMAL LOW (ref 3.5–5.2)
BUN: 8 mg/dL (ref 6–23)
CHLORIDE: 80 mmol/L — AB (ref 96–112)
Calcium: 8.7 mg/dL (ref 8.4–10.5)
Creatinine, Ser: 0.75 mg/dL (ref 0.50–1.35)
GFR calc Af Amer: >90 mL/min (ref >90)
GLUCOSE: 174 mg/dL — AB (ref 70–99)
POTASSIUM: 3 mmol/L — AB (ref 3.5–5.1)
Sodium: 138 mmol/L (ref 135–145)
Total Bilirubin: 0.9 mg/dL (ref 0.3–1.2)
Total Protein: 7.5 g/dL (ref 6.0–8.3)

## 2014-05-08 LAB — URINALYSIS, ROUTINE W REFLEX MICROSCOPIC
Bilirubin Urine: NEGATIVE
Glucose, UA: NEGATIVE mg/dL
Ketones, ur: NEGATIVE mg/dL
Leukocytes, UA: NEGATIVE
NITRITE: NEGATIVE
Protein, ur: NEGATIVE mg/dL
Specific Gravity, Urine: 1.005 (ref 1.005–1.030)
UROBILINOGEN UA: 0.2 mg/dL (ref 0.0–1.0)
pH: 7.5 (ref 5.0–8.0)

## 2014-05-08 LAB — CARBOXYHEMOGLOBIN
Carboxyhemoglobin: 5.8 % (ref 0.5–1.5)
METHEMOGLOBIN: 0.6 % (ref 0.0–1.5)
O2 SAT: 95.6 %
Total hemoglobin: 8.4 g/dL — ABNORMAL LOW (ref 13.5–18.0)

## 2014-05-08 LAB — CBC
HCT: 31.8 % — ABNORMAL LOW (ref 39.0–52.0)
Hemoglobin: 8.1 g/dL — ABNORMAL LOW (ref 13.0–17.0)
MCH: 18.6 pg — AB (ref 26.0–34.0)
MCHC: 25.5 g/dL — ABNORMAL LOW (ref 30.0–36.0)
MCV: 72.9 fL — ABNORMAL LOW (ref 78.0–100.0)
Platelets: 267 10*3/uL (ref 150–400)
RBC: 4.36 MIL/uL (ref 4.22–5.81)
RDW: 22.6 % — ABNORMAL HIGH (ref 11.5–15.5)
WBC: 7.9 10*3/uL (ref 4.0–10.5)

## 2014-05-08 LAB — MAGNESIUM: MAGNESIUM: 1.7 mg/dL (ref 1.5–2.5)

## 2014-05-08 MED ORDER — POTASSIUM CHLORIDE CRYS ER 20 MEQ PO TBCR
40.0000 meq | EXTENDED_RELEASE_TABLET | Freq: Three times a day (TID) | ORAL | Status: AC
  Administered 2014-05-08 (×3): 40 meq via ORAL
  Filled 2014-05-08: qty 2

## 2014-05-08 MED ORDER — POTASSIUM CHLORIDE 10 MEQ/50ML IV SOLN
10.0000 meq | INTRAVENOUS | Status: AC
  Administered 2014-05-08 (×6): 10 meq via INTRAVENOUS
  Filled 2014-05-08 (×6): qty 50

## 2014-05-08 MED ORDER — NICOTINE 21 MG/24HR TD PT24
21.0000 mg | MEDICATED_PATCH | Freq: Every day | TRANSDERMAL | Status: DC
  Administered 2014-05-08 – 2014-05-10 (×3): 21 mg via TRANSDERMAL
  Filled 2014-05-08 (×3): qty 1

## 2014-05-08 NOTE — Progress Notes (Signed)
Overnight Dean Bowers was very lethargic.  Dean Bowers was minimally responsive to sternal rub or pinprick.  Glucose was >100.  An ABG was obtained which showed pH 7.35, CO2 98.6, O2 75, HCO3 54.5.  Given his near normal pH, Dean Bowers likely has chronic hypercarbia.  Dean Bowers has not been compliant with CPAP and Dean Bowers received both lorazepam and percocet.  Neuro exam is non-focal.  CPAP was replaced and a repeat ABG will be drawn.  Central venous O2 sats were 74.4 and 95.6 (verified that this was from the PICC).  Therefore, inotropic support was not started.  Will send u/a to assess for UTI.

## 2014-05-08 NOTE — Progress Notes (Signed)
DAILY PROGRESS NOTE  Subjective:  Somnolence noted overnight-  Work-up has been unrevealing - ABG shows chronic retention of CO2. MVO2 was very high - seems to be aggressively diuresing, including another 15L overnight. ? If weight is accurate, but shows a decrease from 439 lbs to 403 lbs overnight. Liver enzymes improving, likely from decongestion. Potassium is low this am - being repleted.  Objective:  Temp:  [97.6 F (36.4 C)-98.7 F (37.1 C)] 98.3 F (36.8 C) (04/02 0724) Pulse Rate:  [83-108] 101 (04/02 0900) Resp:  [14-26] 23 (04/02 0900) BP: (133-162)/(44-77) 133/62 mmHg (04/02 0900) SpO2:  [89 %-97 %] 94 % (04/02 0900) Weight:  [403 lb 3.5 oz (182.9 kg)] 403 lb 3.5 oz (182.9 kg) (04/02 0435) Weight change: -36 lb 6 oz (-16.5 kg)  Intake/Output from previous day: 04/01 0701 - 04/02 0700 In: 1390.5 [P.O.:910; I.V.:380.5; IV Piggyback:100] Out: 16562 [Urine:16560; Stool:2]  Intake/Output from this shift: Total I/O In: 400 [P.O.:400] Out: 1800 [Urine:1800]  Medications: Current Facility-Administered Medications  Medication Dose Route Frequency Provider Last Rate Last Dose  . 0.9 %  sodium chloride infusion  250 mL Intravenous PRN Jani Gravel, MD      . albuterol (PROVENTIL) (2.5 MG/3ML) 0.083% nebulizer solution 2.5 mg  2.5 mg Inhalation Q6H PRN Jani Gravel, MD      . ALPRAZolam Duanne Moron) tablet 0.5 mg  0.5 mg Oral TID PRN Jani Gravel, MD   0.5 mg at 05/07/14 2153  . antiseptic oral rinse (CPC / CETYLPYRIDINIUM CHLORIDE 0.05%) solution 7 mL  7 mL Mouth Rinse BID Charlynne Cousins, MD   7 mL at 05/07/14 2200  . aspirin EC tablet 81 mg  81 mg Oral Daily Jani Gravel, MD   81 mg at 05/07/14 1128  . diltiazem (CARDIZEM CD) 24 hr capsule 120 mg  120 mg Oral Daily Jani Gravel, MD   120 mg at 05/07/14 1128  . ferrous sulfate tablet 325 mg  325 mg Oral TID WC Janece Canterbury, MD   325 mg at 05/08/14 0846  . furosemide (LASIX) 250 mg in dextrose 5 % 250 mL (1 mg/mL) infusion  20 mg/hr  Intravenous Continuous Isaiah Serge, NP 20 mL/hr at 05/08/14 0620 20 mg/hr at 05/08/14 0620  . gabapentin (NEURONTIN) tablet 600 mg  600 mg Oral BID Jani Gravel, MD   600 mg at 05/07/14 2140  . insulin aspart (novoLOG) injection 0-5 Units  0-5 Units Subcutaneous QHS Jani Gravel, MD   0 Units at 05/03/14 2336  . insulin aspart (novoLOG) injection 0-9 Units  0-9 Units Subcutaneous TID WC Jani Gravel, MD   0 Units at 05/04/14 0700  . levothyroxine (SYNTHROID, LEVOTHROID) tablet 150 mcg  150 mcg Oral QAC breakfast Janece Canterbury, MD   150 mcg at 05/08/14 0845  . metolazone (ZAROXOLYN) tablet 5 mg  5 mg Oral BID Isaiah Serge, NP   5 mg at 05/07/14 2153  . oxyCODONE-acetaminophen (PERCOCET/ROXICET) 5-325 MG per tablet 1 tablet  1 tablet Oral Q8H PRN Jani Gravel, MD   1 tablet at 05/07/14 2140   And  . oxyCODONE (Oxy IR/ROXICODONE) immediate release tablet 5 mg  5 mg Oral Q8H PRN Jani Gravel, MD   5 mg at 05/07/14 1159  . potassium chloride 10 mEq in 50 mL *CENTRAL LINE* IVPB  10 mEq Intravenous Q1 Hr x 6 Skeet Latch, MD   10 mEq at 05/08/14 0809  . potassium chloride SA (K-DUR,KLOR-CON) CR tablet 40 mEq  40  mEq Oral TID Charlynne Cousins, MD      . Rivaroxaban Alveda Reasons) tablet 15 mg  15 mg Oral BID WC Darnell Level Mancheril, RPH   15 mg at 05/08/14 0848  . [START ON 05/20/2014] rivaroxaban (XARELTO) tablet 20 mg  20 mg Oral Q supper Darnell Level Mancheril, RPH      . sodium chloride 0.9 % injection 10-40 mL  10-40 mL Intracatheter PRN Charlynne Cousins, MD   10 mL at 05/06/14 0511  . sodium chloride 0.9 % injection 3 mL  3 mL Intravenous Q12H Jani Gravel, MD   3 mL at 05/07/14 2141  . sodium chloride 0.9 % injection 3 mL  3 mL Intravenous Q12H Jani Gravel, MD   3 mL at 05/07/14 2142  . sodium chloride 0.9 % injection 3 mL  3 mL Intravenous PRN Jani Gravel, MD       Facility-Administered Medications Ordered in Other Encounters  Medication Dose Route Frequency Provider Last Rate Last Dose  . phenylephrine  (NEO-SYNEPHRINE) injection    Anesthesia Intra-op Nolon Nations, MD   40 mcg at 03/26/14 1315  . propofol (DIPRIVAN) 10 mg/mL bolus/IV push    Anesthesia Intra-op Nolon Nations, MD   200 mg at 02/08/14 0414  . succinylcholine (ANECTINE) injection    Anesthesia Intra-op Nolon Nations, MD   180 mg at 03/26/14 1302    Physical Exam: General appearance: alert and morbidly obese Neck: no carotid bruit and neck thick Lungs: diminished breath sounds bilaterally Heart: regular rate and rhythm Abdomen: morbidly obese Extremities: right BKA (recent), 3+ edema Pulses: Left Dorsalis Pedis present 1+ Skin: LLE erythma -multiple tattoos on all extremities Neurologic: Mental status: Alert, oriented, thought content appropriate, follows commands Psych: Frustruated, wants to go home  Lab Results: Results for orders placed or performed during the hospital encounter of 05/03/14 (from the past 48 hour(s))  Glucose, capillary     Status: None   Collection Time: 05/06/14 12:22 PM  Result Value Ref Range   Glucose-Capillary 93 70 - 99 mg/dL  Glucose, capillary     Status: Abnormal   Collection Time: 05/06/14  4:13 PM  Result Value Ref Range   Glucose-Capillary 104 (H) 70 - 99 mg/dL   Comment 1 Notify RN   Glucose, capillary     Status: None   Collection Time: 05/06/14  9:29 PM  Result Value Ref Range   Glucose-Capillary 91 70 - 99 mg/dL  CBC     Status: Abnormal   Collection Time: 05/07/14  5:50 AM  Result Value Ref Range   WBC 9.8 4.0 - 10.5 K/uL   RBC 4.46 4.22 - 5.81 MIL/uL   Hemoglobin 8.1 (L) 13.0 - 17.0 g/dL   HCT 32.5 (L) 39.0 - 52.0 %   MCV 72.9 (L) 78.0 - 100.0 fL   MCH 18.2 (L) 26.0 - 34.0 pg   MCHC 24.9 (L) 30.0 - 36.0 g/dL   RDW 22.2 (H) 11.5 - 15.5 %   Platelets 292 150 - 400 K/uL  Magnesium     Status: None   Collection Time: 05/07/14  5:50 AM  Result Value Ref Range   Magnesium 1.8 1.5 - 2.5 mg/dL  Comprehensive metabolic panel     Status: Abnormal   Collection Time:  05/07/14  5:50 AM  Result Value Ref Range   Sodium 138 135 - 145 mmol/L   Potassium 3.6 3.5 - 5.1 mmol/L   Chloride 91 (L) 96 - 112 mmol/L   CO2 42 (  HH) 19 - 32 mmol/L    Comment: CRITICAL RESULT CALLED TO, READ BACK BY AND VERIFIED WITH: FUTRELL M RN 05/07/14 0653 COSTELLO B REPEATED TO VERIFY    Glucose, Bld 100 (H) 70 - 99 mg/dL   BUN 10 6 - 23 mg/dL   Creatinine, Ser 4.11 0.50 - 1.35 mg/dL   Calcium 8.2 (L) 8.4 - 10.5 mg/dL   Total Protein 7.6 6.0 - 8.3 g/dL   Albumin 2.7 (L) 3.5 - 5.2 g/dL   AST 527 (H) 0 - 37 U/L   ALT 255 (H) 0 - 53 U/L   Alkaline Phosphatase 71 39 - 117 U/L   Total Bilirubin 0.6 0.3 - 1.2 mg/dL   GFR calc non Af Amer >90 >90 mL/min   GFR calc Af Amer >90 >90 mL/min    Comment: (NOTE) The eGFR has been calculated using the CKD EPI equation. This calculation has not been validated in all clinical situations. eGFR's persistently <90 mL/min signify possible Chronic Kidney Disease.    Anion gap 5 5 - 15  Glucose, capillary     Status: Abnormal   Collection Time: 05/07/14  6:14 AM  Result Value Ref Range   Glucose-Capillary 101 (H) 70 - 99 mg/dL   Comment 1 Notify RN    Comment 2 Document in Chart   Glucose, capillary     Status: Abnormal   Collection Time: 05/07/14 11:46 AM  Result Value Ref Range   Glucose-Capillary 104 (H) 70 - 99 mg/dL   Comment 1 Notify RN   Urine rapid drug screen (hosp performed)     Status: None   Collection Time: 05/07/14 12:40 PM  Result Value Ref Range   Opiates NONE DETECTED NONE DETECTED   Cocaine NONE DETECTED NONE DETECTED   Benzodiazepines NONE DETECTED NONE DETECTED   Amphetamines NONE DETECTED NONE DETECTED   Tetrahydrocannabinol NONE DETECTED NONE DETECTED   Barbiturates NONE DETECTED NONE DETECTED    Comment:        DRUG SCREEN FOR MEDICAL PURPOSES ONLY.  IF CONFIRMATION IS NEEDED FOR ANY PURPOSE, NOTIFY LAB WITHIN 5 DAYS.        LOWEST DETECTABLE LIMITS FOR URINE DRUG SCREEN Drug Class       Cutoff  (ng/mL) Amphetamine      1000 Barbiturate      200 Benzodiazepine   200 Tricyclics       300 Opiates          300 Cocaine          300 THC              50   Glucose, capillary     Status: Abnormal   Collection Time: 05/07/14  4:19 PM  Result Value Ref Range   Glucose-Capillary 185 (H) 70 - 99 mg/dL  MRSA PCR Screening     Status: None   Collection Time: 05/07/14  6:57 PM  Result Value Ref Range   MRSA by PCR NEGATIVE NEGATIVE    Comment:        The GeneXpert MRSA Assay (FDA approved for NASAL specimens only), is one component of a comprehensive MRSA colonization surveillance program. It is not intended to diagnose MRSA infection nor to guide or monitor treatment for MRSA infections.   Carboxyhemoglobin     Status: Abnormal   Collection Time: 05/07/14  8:49 PM  Result Value Ref Range   Total hemoglobin 8.6 (L) 13.5 - 18.0 g/dL   O2 Saturation 66.4 %  Carboxyhemoglobin 7.2 (HH) 0.5 - 1.5 %    Comment: CRITICAL RESULT CALLED TO, READ BACK BY AND VERIFIED WITH:  T POTTER RN AT 2058 BY FRANCIS ALMONOR RRT,RCP ON 05/07/2014    Methemoglobin 1.0 0.0 - 1.5 %  Glucose, capillary     Status: Abnormal   Collection Time: 05/07/14  9:44 PM  Result Value Ref Range   Glucose-Capillary 121 (H) 70 - 99 mg/dL   Comment 1 Capillary Specimen   Glucose, capillary     Status: Abnormal   Collection Time: 05/08/14 12:38 AM  Result Value Ref Range   Glucose-Capillary 108 (H) 70 - 99 mg/dL   Comment 1 Capillary Specimen   I-STAT 3, arterial blood gas (G3+)     Status: Abnormal   Collection Time: 05/08/14 12:53 AM  Result Value Ref Range   pH, Arterial 7.350 7.350 - 7.450   pCO2 arterial 98.9 (HH) 35.0 - 45.0 mmHg   pO2, Arterial 76.0 (L) 80.0 - 100.0 mmHg   Bicarbonate 54.5 (H) 20.0 - 24.0 mEq/L   TCO2 >50 0 - 100 mmol/L   O2 Saturation 93.0 %   Acid-Base Excess 24.0 (H) 0.0 - 2.0 mmol/L   Patient temperature 98.7 F    Sample type ARTERIAL    Comment NOTIFIED PHYSICIAN   CBC      Status: Abnormal   Collection Time: 05/08/14  3:36 AM  Result Value Ref Range   WBC 7.9 4.0 - 10.5 K/uL   RBC 4.36 4.22 - 5.81 MIL/uL   Hemoglobin 8.1 (L) 13.0 - 17.0 g/dL   HCT 31.8 (L) 39.0 - 52.0 %   MCV 72.9 (L) 78.0 - 100.0 fL   MCH 18.6 (L) 26.0 - 34.0 pg   MCHC 25.5 (L) 30.0 - 36.0 g/dL   RDW 22.6 (H) 11.5 - 15.5 %   Platelets 267 150 - 400 K/uL  Magnesium     Status: None   Collection Time: 05/08/14  3:36 AM  Result Value Ref Range   Magnesium 1.7 1.5 - 2.5 mg/dL  Comprehensive metabolic panel     Status: Abnormal   Collection Time: 05/08/14  3:36 AM  Result Value Ref Range   Sodium 138 135 - 145 mmol/L   Potassium 3.0 (L) 3.5 - 5.1 mmol/L   Chloride 80 (L) 96 - 112 mmol/L   CO2 >50 (HH) 19 - 32 mmol/L    Comment: REPEATED TO VERIFY CRITICAL RESULT CALLED TO, READ BACK BY AND VERIFIED WITH: [facility] [id number] 05/08/14 M.CAMPBELL    Glucose, Bld 174 (H) 70 - 99 mg/dL   BUN 8 6 - 23 mg/dL   Creatinine, Ser 0.75 0.50 - 1.35 mg/dL   Calcium 8.7 8.4 - 10.5 mg/dL   Total Protein 7.5 6.0 - 8.3 g/dL   Albumin 2.7 (L) 3.5 - 5.2 g/dL   AST 80 (H) 0 - 37 U/L   ALT 201 (H) 0 - 53 U/L   Alkaline Phosphatase 73 39 - 117 U/L   Total Bilirubin 0.9 0.3 - 1.2 mg/dL   GFR calc non Af Amer >90 >90 mL/min   GFR calc Af Amer >90 >90 mL/min    Comment: (NOTE) The eGFR has been calculated using the CKD EPI equation. This calculation has not been validated in all clinical situations. eGFR's persistently <90 mL/min signify possible Chronic Kidney Disease.   Carboxyhemoglobin     Status: Abnormal   Collection Time: 05/08/14  3:40 AM  Result Value Ref Range   Total hemoglobin 8.4 (  L) 13.5 - 18.0 g/dL   O2 Saturation 95.6 %   Carboxyhemoglobin 5.8 (HH) 0.5 - 1.5 %    Comment: CRITICAL RESULT CALLED TO, READ BACK BY AND VERIFIED WITH: POTTER,L RN AT 0400 ON 05/08/2014 BY DAY,J RRT    Methemoglobin 0.6 0.0 - 1.5 %  Urinalysis, Routine w reflex microscopic     Status: Abnormal   Collection  Time: 05/08/14  4:59 AM  Result Value Ref Range   Color, Urine YELLOW YELLOW   APPearance CLEAR CLEAR   Specific Gravity, Urine 1.005 1.005 - 1.030   pH 7.5 5.0 - 8.0   Glucose, UA NEGATIVE NEGATIVE mg/dL   Hgb urine dipstick SMALL (A) NEGATIVE   Bilirubin Urine NEGATIVE NEGATIVE   Ketones, ur NEGATIVE NEGATIVE mg/dL   Protein, ur NEGATIVE NEGATIVE mg/dL   Urobilinogen, UA 0.2 0.0 - 1.0 mg/dL   Nitrite NEGATIVE NEGATIVE   Leukocytes, UA NEGATIVE NEGATIVE  Urine microscopic-add on     Status: None   Collection Time: 05/08/14  4:59 AM  Result Value Ref Range   WBC, UA 0-2 <3 WBC/hpf   RBC / HPF 0-2 <3 RBC/hpf  Glucose, capillary     Status: None   Collection Time: 05/08/14  7:33 AM  Result Value Ref Range   Glucose-Capillary 91 70 - 99 mg/dL    Imaging: No results found.  Assessment:  1. Principal Problem: 2.   Acute on chronic respiratory failure with hypoxemia 3. Active Problems: 4.   Obesity, Class III, BMI 40-49.9 (morbid obesity) 5.   Diabetes mellitus without complication 6.   OSA (obstructive sleep apnea) 7.   Pulmonary edema 8.   Hypokalemia 9.   Anemia, iron deficiency 10.   Acute on chronic diastolic heart failure 11.   Elevated TSH 12.   Acute pulmonary embolism 13.   Anticoagulation adequate, on xarelto 14.   Plan:  1. Aggressive IV diuresis, but he seems to be tolerating it. Weight is coming down quickly. Renal function is stable. Continue IV diuresis today. Replete potassium. I do not see a need for inotropes at this point.  Time Spent Directly with Patient:  15 minutes  Length of Stay:  LOS: 5 days   Pixie Casino, MD, [facility] Attending Cardiologist [facility]  HILTY,Kenneth C 05/08/2014, 9:39 AM

## 2014-05-08 NOTE — Progress Notes (Signed)
[facility] PROGRESS NOTE Interim History: 44 yo male with hx of hypertension, hyperlipidemia, Dm2, Copd (on home o2), severe OSA, on bipap, PE/DVT, polysubstance abuse-cocaine/THC apparently c/o increasing lower ext edema. Pt uncertain about weight gain. Denied fever, chills, cp, palp, n/v, diarrhea, brbpr, black stool but had sob with exertion. Slight orthopnea. Denies pnd. Pt presented to ED for evaluation and CXR showed pulmonary edema, with elevation in BNP , trop negative. Started diuresis. Per weights from previous admissions, his base weight is around 370-380 pounds. Currently he is up to 430. Filed Weights   05/06/14 0553 05/07/14 0526 05/08/14 0435  Weight: 198.449 kg (437 lb 8 oz) 199.4 kg (439 lb 9.6 oz) 182.9 kg (403 lb 3.5 oz)        Intake/Output Summary (Last 24 hours) at 05/08/14 0735 Last data filed at 05/08/14 0600  Gross per 24 hour  Intake 1390.5 ml  Output  16562 ml  Net -15171.5 ml     Assessment/Plan: Acute on chronic respiratory failure with hypoxemia due to  Acute on chronic diastolic heart failure/  *Acute on chronic respiratory failure with hypoxemia: - Due to fluid none compliance. - Estimated dry weight 170-175 kg. He is a 198.9 kg. His blood pressure stable. - CVP 14, co-ox 95. Replete K and recheck in am. - IV Lasix ggt and metolazone, with brisk diuresis (16L), weight has improved. - Appreciate cardiology consult  Lower extremity swelling likely secondary to acute on chronic diastolic heart failure: - The patient has a known history of DVT and PE, is been compliant with his Xarelto  Diabetes mellitus without complication - J0K is 6.3 cont. control continue current therapy.  Transaminatis - due to passive congestion, improving with diuresis.  Obesity, Class III, BMI 40-49.9 (morbid obesity): - Counseling.  Iron deficiency anemia: - Follow-up with GI as an outpatient.  - Ferritin of 15, change to oral.  New diagnosis of  hypothyroidism: - High TSH and low free T4 continue Synthroid recheck in 6 weeks.  Hypokalemia -Replete as needed. Check a mag  Severe  OSA (obstructive sleep apnea) - Continue BiPAP.  History of right BKA status post stump revision: Continue to monitor.    Code Status: full Family Communication: pateint, his wife and daughter Disposition Plan: pending a lot of diuresis. Weight is up 50-lbs from last month   Consultants:  none  Procedures: - Echocardiogram on 1 2016 poor quality showed LV systolic function is probably normal and probable diastolic dysfunction. CXR  Antibiotics:  Doxy 3.29.2016  HPI/Subjective: Relates he feels better.  Objective: Filed Vitals:   05/08/14 0400 05/08/14 0435 05/08/14 0440 05/08/14 0724  BP:   153/60 149/53  Pulse: 83  86 88  Temp:  98.7 F (37.1 C)  98.3 F (36.8 C)  TempSrc:  Axillary  Axillary  Resp: 15  16 14   Height:      Weight:  182.9 kg (403 lb 3.5 oz)    SpO2: 94%  97% 91%     Exam:  General: Alert, awake, oriented x3, in no acute distress.  HEENT: No bruits, no goiter.  Heart: Regular rate and rhythm, no appreciated JVD 3+ edema in his lower extremities. Lungs: Good air movement, clear Abdomen: Soft, nontender, nondistended, positive bowel sounds.    Data Reviewed: Basic Metabolic Panel:  Recent Labs Lab 05/04/14 0420 05/05/14 0310 05/06/14 0512 05/07/14 0550 05/08/14 0336  NA 135 138 138 138 138  K 3.4* 3.9 3.3* 3.6 3.0*  CL 91* 91* 90*  91* 80*  CO2 34* 41* 38* 42* >50*  GLUCOSE 129* 111* 114* 100* 174*  BUN 14 15 12 10 8   CREATININE 0.82 0.78 0.67 0.67 0.75  CALCIUM 8.5 8.4 8.4 8.2* 8.7  MG  --  1.8 1.8 1.8 1.7   Liver Function Tests:  Recent Labs Lab 05/04/14 0420 05/05/14 0310 05/06/14 0512 05/07/14 0550 05/08/14 0336  AST 637* 376* 206* 117* 80*  ALT 622* 503* 353* 255* 201*  ALKPHOS 69 77 72 71 73  BILITOT 0.7 0.8 0.7 0.6 0.9  PROT 7.4 7.8 6.9 7.6 7.5  ALBUMIN 2.6* 2.8* 2.5*  2.7* 2.7*   No results for input(s): LIPASE, AMYLASE in the last 168 hours. No results for input(s): AMMONIA in the last 168 hours. CBC:  Recent Labs Lab 05/04/14 0420 05/05/14 0310 05/06/14 0512 05/07/14 0550 05/08/14 0336  WBC 12.0* 11.5* 9.8 9.8 7.9  HGB 8.8* 8.2* 8.0* 8.1* 8.1*  HCT 33.8* 32.7* 32.6* 32.5* 31.8*  MCV 72.8* 73.3* 73.4* 72.9* 72.9*  PLT 348 354 340 292 267   Cardiac Enzymes:  Recent Labs Lab 05/03/14 2324 05/04/14 0420 05/04/14 0952  TROPONINI <0.03 0.04* 0.03   BNP (last 3 results)  Recent Labs  03/28/14 0115 04/28/14 1919 05/03/14 1813  BNP 116.1* 371.5* 451.1*    ProBNP (last 3 results) No results for input(s): PROBNP in the last 8760 hours.  CBG:  Recent Labs Lab 05/07/14 0614 05/07/14 1146 05/07/14 1619 05/07/14 2144 05/08/14 0038  GLUCAP 101* 104* 185* 121* 108*    Recent Results (from the past 240 hour(s))  MRSA PCR Screening     Status: None   Collection Time: 05/07/14  6:57 PM  Result Value Ref Range Status   MRSA by PCR NEGATIVE NEGATIVE Final    Comment:        The GeneXpert MRSA Assay (FDA approved for NASAL specimens only), is one component of a comprehensive MRSA colonization surveillance program. It is not intended to diagnose MRSA infection nor to guide or monitor treatment for MRSA infections.      Studies: No results found.  Scheduled Meds: . antiseptic oral rinse  7 mL Mouth Rinse BID  . aspirin EC  81 mg Oral Daily  . diltiazem  120 mg Oral Daily  . ferrous sulfate  325 mg Oral TID WC  . gabapentin  600 mg Oral BID  . insulin aspart  0-5 Units Subcutaneous QHS  . insulin aspart  0-9 Units Subcutaneous TID WC  . levothyroxine  150 mcg Oral QAC breakfast  . metolazone  5 mg Oral BID  . potassium chloride  40 mEq Oral TID  . potassium chloride  10 mEq Intravenous Q1 Hr x 6  . Rivaroxaban  15 mg Oral BID WC  . [START ON 05/20/2014] rivaroxaban  20 mg Oral Q supper  . sodium chloride  3 mL  Intravenous Q12H  . sodium chloride  3 mL Intravenous Q12H   Continuous Infusions: . furosemide (LASIX) infusion 20 mg/hr (05/08/14 0620)     Charlynne Cousins  [facility] Pager [phone] If 7PM-7AM, please contact night-coverage at [email or web address], password [facility] 05/08/2014, 7:35 AM  LOS: 5 days

## 2014-05-08 NOTE — Progress Notes (Signed)
CRITICAL VALUE ALERT  Critical value received:  Co-ox 5.8  Date of notification:  05/08/2014  Time of notification:  0408  Critical value read back:Yes.    Nurse who received alert: Blase Mess, RN  MD notified (1st page):  Oval Linsey  Time of first page:  [phone]  MD notified (2nd page):  Time of second page:  Responding MD:  Oval Linsey  Time MD responded:  [phone]

## 2014-05-08 NOTE — Progress Notes (Signed)
Pt attempted to get on side of bed to pee, very lethargic, different from baseline. Called Dr. Oval Linsey, got an order for a foley, and blood gas. Oval Linsey came to bedside, respiratory drew blood gas, pt on cpap now. Will continue to monitor.

## 2014-05-09 DIAGNOSIS — I2699 Other pulmonary embolism without acute cor pulmonale: Secondary | ICD-10-CM

## 2014-05-09 LAB — GLUCOSE, CAPILLARY
GLUCOSE-CAPILLARY: 110 mg/dL — AB (ref 70–99)
Glucose-Capillary: 120 mg/dL — ABNORMAL HIGH (ref 70–99)
Glucose-Capillary: 120 mg/dL — ABNORMAL HIGH (ref 70–99)
Glucose-Capillary: 108 mg/dL — ABNORMAL HIGH (ref 70–99)

## 2014-05-09 LAB — COMPREHENSIVE METABOLIC PANEL
ALBUMIN: 2.9 g/dL — AB (ref 3.5–5.2)
ALK PHOS: 78 U/L (ref 39–117)
ALT: 173 U/L — AB (ref 0–53)
AST: 66 U/L — ABNORMAL HIGH (ref 0–37)
Anion gap: 21 — ABNORMAL HIGH (ref 5–15)
BUN: 11 mg/dL (ref 6–23)
CALCIUM: 9.9 mg/dL (ref 8.4–10.5)
CO2: 49 mmol/L — AB (ref 19–32)
CREATININE: 0.78 mg/dL (ref 0.50–1.35)
Chloride: 65 mmol/L — ABNORMAL LOW (ref 96–112)
GFR calc Af Amer: >90 mL/min (ref >90)
GFR calc non Af Amer: >90 mL/min (ref >90)
GLUCOSE: 99 mg/dL (ref 70–99)
POTASSIUM: 2.9 mmol/L — AB (ref 3.5–5.1)
SODIUM: 135 mmol/L (ref 135–145)
TOTAL PROTEIN: 8.1 g/dL (ref 6.0–8.3)
Total Bilirubin: 1.4 mg/dL — ABNORMAL HIGH (ref 0.3–1.2)

## 2014-05-09 LAB — CARBOXYHEMOGLOBIN
Carboxyhemoglobin: 3.3 % — ABNORMAL HIGH (ref 0.5–1.5)
Methemoglobin: 0.9 % (ref 0.0–1.5)
O2 Saturation: 61.7 %
Total hemoglobin: 9.4 g/dL — ABNORMAL LOW (ref 13.5–18.0)

## 2014-05-09 LAB — CBC
HCT: 34.6 % — ABNORMAL LOW (ref 39.0–52.0)
HEMOGLOBIN: 9.1 g/dL — AB (ref 13.0–17.0)
MCH: 18.8 pg — AB (ref 26.0–34.0)
MCHC: 26.3 g/dL — ABNORMAL LOW (ref 30.0–36.0)
MCV: 71.6 fL — AB (ref 78.0–100.0)
Platelets: 272 10*3/uL (ref 150–400)
RBC: 4.83 MIL/uL (ref 4.22–5.81)
RDW: 23.5 % — AB (ref 11.5–15.5)
WBC: 7.2 10*3/uL (ref 4.0–10.5)

## 2014-05-09 LAB — POTASSIUM: Potassium: 2.7 mmol/L — CL (ref 3.5–5.1)

## 2014-05-09 LAB — MAGNESIUM: Magnesium: 1.9 mg/dL (ref 1.5–2.5)

## 2014-05-09 MED ORDER — POTASSIUM CHLORIDE 10 MEQ/100ML IV SOLN: 10.0000 meq | INTRAVENOUS | Status: DC

## 2014-05-09 MED ORDER — POTASSIUM CHLORIDE 10 MEQ/50ML IV SOLN
10.0000 meq | INTRAVENOUS | Status: AC
  Administered 2014-05-09 – 2014-05-10 (×4): 10 meq via INTRAVENOUS
  Filled 2014-05-09 (×3): qty 50

## 2014-05-09 MED ORDER — POTASSIUM CHLORIDE 10 MEQ/50ML IV SOLN
10.0000 meq | INTRAVENOUS | Status: AC
  Administered 2014-05-09 (×5): 10 meq via INTRAVENOUS
  Filled 2014-05-09: qty 50

## 2014-05-09 MED ORDER — POTASSIUM CHLORIDE 10 MEQ/50ML IV SOLN
INTRAVENOUS | Status: AC
  Administered 2014-05-09: 10 meq
  Filled 2014-05-09: qty 50

## 2014-05-09 MED ORDER — TORSEMIDE 20 MG PO TABS
20.0000 mg | ORAL_TABLET | Freq: Two times a day (BID) | ORAL | Status: DC
  Administered 2014-05-09 – 2014-05-10 (×2): 20 mg via ORAL
  Filled 2014-05-09 (×4): qty 1

## 2014-05-09 MED ORDER — POTASSIUM CHLORIDE CRYS ER 20 MEQ PO TBCR
40.0000 meq | EXTENDED_RELEASE_TABLET | Freq: Three times a day (TID) | ORAL | Status: AC
  Administered 2014-05-09 (×2): 40 meq via ORAL
  Filled 2014-05-09 (×2): qty 2

## 2014-05-09 NOTE — Progress Notes (Signed)
Md notified about pt's critical co2 49. Has improved from yesterday.  Potassium this am is 2.9. Pt's sats 80's to 90's pt stated" it's normal to be in 80's while sleep. Has been in 70's before."  Pt alert on cpap. Will continue to monitor. Saunders Revel T

## 2014-05-09 NOTE — Progress Notes (Signed)
RT assisted pt with placing on his home cpap. Pt. Has 3L of oxygen titrated into the cpap. Pt. Tolerating well at this time.

## 2014-05-09 NOTE — Progress Notes (Signed)
Notified md about pt's potassium level 2.7.  New orders received. Will continue to monitor. Saunders Revel T

## 2014-05-09 NOTE — Progress Notes (Signed)
Wife changed patient dressing to right stump. Sutures and staples intact no drainage. No s/s of infection. Dr. Olevia Bowens notified that patient wants Dr Sharol Given to see him while in the hospital.

## 2014-05-09 NOTE — Progress Notes (Addendum)
DAILY PROGRESS NOTE  Subjective:  Brisk diuresis. Now out 40L negative. Weight is down to baseline at 367 today. Marked hypokalemia.  Objective:  Temp:  [98 F (36.7 C)-98.9 F (37.2 C)] 98.9 F (37.2 C) (04/03 0803) Pulse Rate:  [84-108] 108 (04/03 0800) Resp:  [12-27] 21 (04/03 0800) BP: (90-134)/(37-71) 119/71 mmHg (04/03 0800) SpO2:  [79 %-99 %] 90 % (04/03 0800) Weight:  [367 lb 15.2 oz (166.9 kg)] 367 lb 15.2 oz (166.9 kg) (04/03 0329) Weight change: -35 lb 4.4 oz (-16 kg)  Intake/Output from previous day: 04/02 0701 - 04/03 0700 In: 1672.9 [P.O.:1120; I.V.:352.9; IV Piggyback:200] Out: 86885 [Urine:16500]  Intake/Output from this shift: Total I/O In: 187.2 [P.O.:120; I.V.:17.2; IV Piggyback:50] Out: 600 [Urine:600]  Medications: Current Facility-Administered Medications  Medication Dose Route Frequency Provider Last Rate Last Dose  . 0.9 %  sodium chloride infusion  250 mL Intravenous PRN Pearson Grippe, MD      . albuterol (PROVENTIL) (2.5 MG/3ML) 0.083% nebulizer solution 2.5 mg  2.5 mg Inhalation Q6H PRN Pearson Grippe, MD      . ALPRAZolam Prudy Feeler) tablet 0.5 mg  0.5 mg Oral TID PRN Pearson Grippe, MD   0.5 mg at 05/07/14 2153  . antiseptic oral rinse (CPC / CETYLPYRIDINIUM CHLORIDE 0.05%) solution 7 mL  7 mL Mouth Rinse BID Marinda Elk, MD   7 mL at 05/08/14 2200  . aspirin EC tablet 81 mg  81 mg Oral Daily Pearson Grippe, MD   81 mg at 05/09/14 1056  . diltiazem (CARDIZEM CD) 24 hr capsule 120 mg  120 mg Oral Daily Pearson Grippe, MD   120 mg at 05/09/14 1055  . ferrous sulfate tablet 325 mg  325 mg Oral TID WC Renae Fickle, MD   325 mg at 05/09/14 0759  . furosemide (LASIX) 250 mg in dextrose 5 % 250 mL (1 mg/mL) infusion  4 mg/hr Intravenous Continuous Marinda Elk, MD 4 mL/hr at 05/09/14 0732 4 mg/hr at 05/09/14 0732  . gabapentin (NEURONTIN) tablet 600 mg  600 mg Oral BID Pearson Grippe, MD   600 mg at 05/09/14 1055  . insulin aspart (novoLOG) injection 0-5 Units   0-5 Units Subcutaneous QHS Pearson Grippe, MD   0 Units at 05/03/14 2336  . insulin aspart (novoLOG) injection 0-9 Units  0-9 Units Subcutaneous TID WC Pearson Grippe, MD   0 Units at 05/04/14 0700  . levothyroxine (SYNTHROID, LEVOTHROID) tablet 150 mcg  150 mcg Oral QAC breakfast Renae Fickle, MD   150 mcg at 05/09/14 0645  . nicotine (NICODERM CQ - dosed in mg/24 hours) patch 21 mg  21 mg Transdermal Daily Marinda Elk, MD   21 mg at 05/09/14 1055  . oxyCODONE-acetaminophen (PERCOCET/ROXICET) 5-325 MG per tablet 1 tablet  1 tablet Oral Q8H PRN Pearson Grippe, MD   1 tablet at 05/08/14 2018   And  . oxyCODONE (Oxy IR/ROXICODONE) immediate release tablet 5 mg  5 mg Oral Q8H PRN Pearson Grippe, MD   5 mg at 05/08/14 2018  . potassium chloride 10 mEq in 50 mL *CENTRAL LINE* IVPB  10 mEq Intravenous Q1 Hr x 6 Marinda Elk, MD   10 mEq at 05/09/14 1136  . potassium chloride SA (K-DUR,KLOR-CON) CR tablet 40 mEq  40 mEq Oral TID Marinda Elk, MD      . Rivaroxaban Carlena Hurl) tablet 15 mg  15 mg Oral BID WC Candis Schatz Mancheril, RPH   15 mg at 05/09/14  1962  Derrill Memo ON 05/20/2014] rivaroxaban (XARELTO) tablet 20 mg  20 mg Oral Q supper Darnell Level Mancheril, RPH      . sodium chloride 0.9 % injection 10-40 mL  10-40 mL Intracatheter PRN Charlynne Cousins, MD   10 mL at 05/06/14 0511  . sodium chloride 0.9 % injection 3 mL  3 mL Intravenous Q12H Jani Gravel, MD   10 mL at 05/08/14 1117  . sodium chloride 0.9 % injection 3 mL  3 mL Intravenous Q12H Jani Gravel, MD   10 mL at 05/08/14 1116  . sodium chloride 0.9 % injection 3 mL  3 mL Intravenous PRN Jani Gravel, MD       Facility-Administered Medications Ordered in Other Encounters  Medication Dose Route Frequency Provider Last Rate Last Dose  . phenylephrine (NEO-SYNEPHRINE) injection    Anesthesia Intra-op Nolon Nations, MD   40 mcg at 03/26/14 1315  . propofol (DIPRIVAN) 10 mg/mL bolus/IV push    Anesthesia Intra-op Nolon Nations, MD   200 mg at  02/08/14 0414  . succinylcholine (ANECTINE) injection    Anesthesia Intra-op Nolon Nations, MD   180 mg at 03/26/14 1302    Physical Exam: General appearance: alert and morbidly obese Neck: no carotid bruit and neck thick Lungs: diminished breath sounds bilaterally Heart: regular rate and rhythm Abdomen: morbidly obese Extremities: right BKA (recent), 3+ edema Pulses: Left Dorsalis Pedis present 1+ Skin: LLE erythma -multiple tattoos on all extremities Neurologic: Mental status: Alert, oriented, thought content appropriate, follows commands Psych: Frustruated, wants to go home  Lab Results: Results for orders placed or performed during the hospital encounter of 05/03/14 (from the past 48 hour(s))  Urine rapid drug screen (hosp performed)     Status: None   Collection Time: 05/07/14 12:40 PM  Result Value Ref Range   Opiates NONE DETECTED NONE DETECTED   Cocaine NONE DETECTED NONE DETECTED   Benzodiazepines NONE DETECTED NONE DETECTED   Amphetamines NONE DETECTED NONE DETECTED   Tetrahydrocannabinol NONE DETECTED NONE DETECTED   Barbiturates NONE DETECTED NONE DETECTED    Comment:        DRUG SCREEN FOR MEDICAL PURPOSES ONLY.  IF CONFIRMATION IS NEEDED FOR ANY PURPOSE, NOTIFY LAB WITHIN 5 DAYS.        LOWEST DETECTABLE LIMITS FOR URINE DRUG SCREEN Drug Class       Cutoff (ng/mL) Amphetamine      1000 Barbiturate      200 Benzodiazepine   229 Tricyclics       798 Opiates          300 Cocaine          300 THC              50   Glucose, capillary     Status: Abnormal   Collection Time: 05/07/14  4:19 PM  Result Value Ref Range   Glucose-Capillary 185 (H) 70 - 99 mg/dL  MRSA PCR Screening     Status: None   Collection Time: 05/07/14  6:57 PM  Result Value Ref Range   MRSA by PCR NEGATIVE NEGATIVE    Comment:        The GeneXpert MRSA Assay (FDA approved for NASAL specimens only), is one component of a comprehensive MRSA colonization surveillance program. It is  not intended to diagnose MRSA infection nor to guide or monitor treatment for MRSA infections.   Carboxyhemoglobin     Status: Abnormal   Collection Time: 05/07/14  8:49 PM  Result Value Ref Range   Total hemoglobin 8.6 (L) 13.5 - 18.0 g/dL   O2 Saturation 01.6 %   Carboxyhemoglobin 7.2 (HH) 0.5 - 1.5 %    Comment: CRITICAL RESULT CALLED TO, READ BACK BY AND VERIFIED WITH:  T POTTER RN AT 2058 BY FRANCIS ALMONOR RRT,RCP ON 05/07/2014    Methemoglobin 1.0 0.0 - 1.5 %  Glucose, capillary     Status: Abnormal   Collection Time: 05/07/14  9:44 PM  Result Value Ref Range   Glucose-Capillary 121 (H) 70 - 99 mg/dL   Comment 1 Capillary Specimen   Glucose, capillary     Status: Abnormal   Collection Time: 05/08/14 12:38 AM  Result Value Ref Range   Glucose-Capillary 108 (H) 70 - 99 mg/dL   Comment 1 Capillary Specimen   I-STAT 3, arterial blood gas (G3+)     Status: Abnormal   Collection Time: 05/08/14 12:53 AM  Result Value Ref Range   pH, Arterial 7.350 7.350 - 7.450   pCO2 arterial 98.9 (HH) 35.0 - 45.0 mmHg   pO2, Arterial 76.0 (L) 80.0 - 100.0 mmHg   Bicarbonate 54.5 (H) 20.0 - 24.0 mEq/L   TCO2 >50 0 - 100 mmol/L   O2 Saturation 93.0 %   Acid-Base Excess 24.0 (H) 0.0 - 2.0 mmol/L   Patient temperature 98.7 F    Sample type ARTERIAL    Comment NOTIFIED PHYSICIAN   CBC     Status: Abnormal   Collection Time: 05/08/14  3:36 AM  Result Value Ref Range   WBC 7.9 4.0 - 10.5 K/uL   RBC 4.36 4.22 - 5.81 MIL/uL   Hemoglobin 8.1 (L) 13.0 - 17.0 g/dL   HCT 15.4 (L) 55.2 - 87.8 %   MCV 72.9 (L) 78.0 - 100.0 fL   MCH 18.6 (L) 26.0 - 34.0 pg   MCHC 25.5 (L) 30.0 - 36.0 g/dL   RDW 13.2 (H) 21.6 - 01.0 %   Platelets 267 150 - 400 K/uL  Magnesium     Status: None   Collection Time: 05/08/14  3:36 AM  Result Value Ref Range   Magnesium 1.7 1.5 - 2.5 mg/dL  Comprehensive metabolic panel     Status: Abnormal   Collection Time: 05/08/14  3:36 AM  Result Value Ref Range   Sodium 138  135 - 145 mmol/L   Potassium 3.0 (L) 3.5 - 5.1 mmol/L   Chloride 80 (L) 96 - 112 mmol/L   CO2 >50 (HH) 19 - 32 mmol/L    Comment: REPEATED TO VERIFY CRITICAL RESULT CALLED TO, READ BACK BY AND VERIFIED WITH: [facility] 2438 05/08/14 M.CAMPBELL    Glucose, Bld 174 (H) 70 - 99 mg/dL   BUN 8 6 - 23 mg/dL   Creatinine, Ser 5.06 0.50 - 1.35 mg/dL   Calcium 8.7 8.4 - 80.5 mg/dL   Total Protein 7.5 6.0 - 8.3 g/dL   Albumin 2.7 (L) 3.5 - 5.2 g/dL   AST 80 (H) 0 - 37 U/L   ALT 201 (H) 0 - 53 U/L   Alkaline Phosphatase 73 39 - 117 U/L   Total Bilirubin 0.9 0.3 - 1.2 mg/dL   GFR calc non Af Amer >90 >90 mL/min   GFR calc Af Amer >90 >90 mL/min    Comment: (NOTE) The eGFR has been calculated using the CKD EPI equation. This calculation has not been validated in all clinical situations. eGFR's persistently <90 mL/min signify possible Chronic Kidney Disease.   Carboxyhemoglobin  Status: Abnormal   Collection Time: 05/08/14  3:40 AM  Result Value Ref Range   Total hemoglobin 8.4 (L) 13.5 - 18.0 g/dL   O2 Saturation 95.6 %   Carboxyhemoglobin 5.8 (HH) 0.5 - 1.5 %    Comment: CRITICAL RESULT CALLED TO, READ BACK BY AND VERIFIED WITH: POTTER,L RN AT 0400 ON 05/08/2014 BY DAY,J RRT    Methemoglobin 0.6 0.0 - 1.5 %  Urinalysis, Routine w reflex microscopic     Status: Abnormal   Collection Time: 05/08/14  4:59 AM  Result Value Ref Range   Color, Urine YELLOW YELLOW   APPearance CLEAR CLEAR   Specific Gravity, Urine 1.005 1.005 - 1.030   pH 7.5 5.0 - 8.0   Glucose, UA NEGATIVE NEGATIVE mg/dL   Hgb urine dipstick SMALL (A) NEGATIVE   Bilirubin Urine NEGATIVE NEGATIVE   Ketones, ur NEGATIVE NEGATIVE mg/dL   Protein, ur NEGATIVE NEGATIVE mg/dL   Urobilinogen, UA 0.2 0.0 - 1.0 mg/dL   Nitrite NEGATIVE NEGATIVE   Leukocytes, UA NEGATIVE NEGATIVE  Urine microscopic-add on     Status: None   Collection Time: 05/08/14  4:59 AM  Result Value Ref Range   WBC, UA 0-2 <3 WBC/hpf   RBC / HPF  0-2 <3 RBC/hpf  Glucose, capillary     Status: None   Collection Time: 05/08/14  7:33 AM  Result Value Ref Range   Glucose-Capillary 91 70 - 99 mg/dL  Glucose, capillary     Status: Abnormal   Collection Time: 05/08/14 12:00 PM  Result Value Ref Range   Glucose-Capillary 108 (H) 70 - 99 mg/dL  Glucose, capillary     Status: Abnormal   Collection Time: 05/08/14  4:41 PM  Result Value Ref Range   Glucose-Capillary 114 (H) 70 - 99 mg/dL  Glucose, capillary     Status: Abnormal   Collection Time: 05/08/14  9:46 PM  Result Value Ref Range   Glucose-Capillary 113 (H) 70 - 99 mg/dL   Comment 1 Capillary Specimen   CBC     Status: Abnormal   Collection Time: 05/09/14  4:29 AM  Result Value Ref Range   WBC 7.2 4.0 - 10.5 K/uL   RBC 4.83 4.22 - 5.81 MIL/uL   Hemoglobin 9.1 (L) 13.0 - 17.0 g/dL   HCT 34.6 (L) 39.0 - 52.0 %   MCV 71.6 (L) 78.0 - 100.0 fL   MCH 18.8 (L) 26.0 - 34.0 pg   MCHC 26.3 (L) 30.0 - 36.0 g/dL   RDW 23.5 (H) 11.5 - 15.5 %   Platelets 272 150 - 400 K/uL  Magnesium     Status: None   Collection Time: 05/09/14  4:29 AM  Result Value Ref Range   Magnesium 1.9 1.5 - 2.5 mg/dL  Comprehensive metabolic panel     Status: Abnormal   Collection Time: 05/09/14  4:29 AM  Result Value Ref Range   Sodium 135 135 - 145 mmol/L   Potassium 2.9 (L) 3.5 - 5.1 mmol/L   Chloride 65 (L) 96 - 112 mmol/L    Comment: DELTA CHECK NOTED REPEATED TO VERIFY    CO2 49 (HH) 19 - 32 mmol/L    Comment: REPEATED TO VERIFY CRITICAL RESULT CALLED TO, READ BACK BY AND VERIFIED WITH: CORO J,RN 05/09/14 0549 WAYK    Glucose, Bld 99 70 - 99 mg/dL   BUN 11 6 - 23 mg/dL   Creatinine, Ser 0.78 0.50 - 1.35 mg/dL   Calcium 9.9 8.4 - 10.5  mg/dL   Total Protein 8.1 6.0 - 8.3 g/dL   Albumin 2.9 (L) 3.5 - 5.2 g/dL   AST 66 (H) 0 - 37 U/L   ALT 173 (H) 0 - 53 U/L   Alkaline Phosphatase 78 39 - 117 U/L   Total Bilirubin 1.4 (H) 0.3 - 1.2 mg/dL   GFR calc non Af Amer >90 >90 mL/min   GFR calc Af  Amer >90 >90 mL/min    Comment: (NOTE) The eGFR has been calculated using the CKD EPI equation. This calculation has not been validated in all clinical situations. eGFR's persistently <90 mL/min signify possible Chronic Kidney Disease.    Anion gap 21 (H) 5 - 15  Carboxyhemoglobin     Status: Abnormal   Collection Time: 05/09/14  4:30 AM  Result Value Ref Range   Total hemoglobin 9.4 (L) 13.5 - 18.0 g/dL   O2 Saturation 61.7 %   Carboxyhemoglobin 3.3 (H) 0.5 - 1.5 %   Methemoglobin 0.9 0.0 - 1.5 %  Glucose, capillary     Status: Abnormal   Collection Time: 05/09/14  8:05 AM  Result Value Ref Range   Glucose-Capillary 120 (H) 70 - 99 mg/dL    Imaging: No results found.  Assessment:  Principal Problem:   Acute on chronic respiratory failure with hypoxemia Active Problems:   Obesity, Class III, BMI 40-49.9 (morbid obesity)   Diabetes mellitus without complication   OSA (obstructive sleep apnea)   Pulmonary edema   Hypokalemia   Anemia, iron deficiency   Acute on chronic diastolic heart failure   Elevated TSH   Acute pulmonary embolism   Anticoagulation adequate, on xarelto   Right heart failure   Plan:  1. D/C lasix gtts. Change to torsemide 20 mg BID po starting tonight. Replete potassium - closing in on discharge, maybe tomorrow if he if he remains net negative. Likely could transfer to the floor later today.  Time Spent Directly with Patient:  15 minutes  Length of Stay:  LOS: 6 days   Pixie Casino, MD, [facility] Attending Cardiologist [facility]  HILTY,Kenneth C 05/09/2014, 11:48 AM

## 2014-05-09 NOTE — Progress Notes (Signed)
Pt kept dropping o2 sats to 80's.  Replace o2 probe, manual check sat probe. RT called. o2 sats now 94%. Pt is on cpap.  Will continue to monitor. Saunders Revel T

## 2014-05-09 NOTE — Progress Notes (Signed)
[facility] PROGRESS NOTE Interim History: 44 yo male with hx of hypertension, hyperlipidemia, Dm2, Copd (on home o2), severe OSA, on bipap, PE/DVT, polysubstance abuse-cocaine/THC apparently c/o increasing lower ext edema. Pt uncertain about weight gain. Denied fever, chills, cp, palp, n/v, diarrhea, brbpr, black stool but had sob with exertion. Slight orthopnea. Denies pnd. Pt presented to ED for evaluation and CXR showed pulmonary edema, with elevation in BNP , trop negative. Started diuresis. Per weights from previous admissions, his base weight is around 370-380 pounds. Currently he is up to 430. Filed Weights   05/07/14 0526 05/08/14 0435 05/09/14 0329  Weight: 199.4 kg (439 lb 9.6 oz) 182.9 kg (403 lb 3.5 oz) 166.9 kg (367 lb 15.2 oz)        Intake/Output Summary (Last 24 hours) at 05/09/14 7412 Last data filed at 05/09/14 0636  Gross per 24 hour  Intake 1672.92 ml  Output  16500 ml  Net -14827.08 ml     Assessment/Plan: Acute on chronic respiratory failure with hypoxemia due to  Acute on chronic diastolic heart failure/  *Acute on chronic respiratory failure with hypoxemia: - Due to fluid none compliance.He is below his EDW now 166.9kg. His blood pressure stable. - CVP 7, co-ox 62. Replete K and recheck in am. Chloride extremely low. - Decrease IV Lasix ggt, d/c metolazone, cont to have brisk diuresis (16L), weight has improved. - Appreciate cardiology consult  Lower extremity swelling likely secondary to acute on chronic diastolic heart failure: - The patient has a known history of DVT and PE, is been compliant with his Xarelto  Diabetes mellitus without complication - I7O is 6.3 cont. control continue current therapy.  Transaminatis - due to passive congestion, improving with diuresis.  Obesity, Class III, BMI 40-49.9 (morbid obesity): - Counseling.  Iron deficiency anemia: - Follow-up with GI as an outpatient.  - Ferritin of 15, change to oral.  New  diagnosis of hypothyroidism: - High TSH and low free T4 continue Synthroid recheck in 6 weeks.  Hypokalemia -Replete as needed. Check a mag  Severe  OSA (obstructive sleep apnea) - Continue BiPAP.  History of right BKA status post stump revision: Continue to monitor.    Code Status: full Family Communication: pateint, his wife and daughter Disposition Plan: pending a lot of diuresis. Weight is up 50-lbs from last month   Consultants:  none  Procedures: - Echocardiogram on 1 2016 poor quality showed LV systolic function is probably normal and probable diastolic dysfunction. CXR  Antibiotics:  Doxy 3.29.2016  HPI/Subjective: Relates he feels better. Wants to go home.  Objective: Filed Vitals:   05/09/14 0300 05/09/14 0329 05/09/14 0500 05/09/14 0650  BP: 133/66  134/50   Pulse: 84 91 100 99  Temp:  98.4 F (36.9 C)    TempSrc:  Oral    Resp: 15 18 19 15   Height:      Weight:  166.9 kg (367 lb 15.2 oz)    SpO2: 82% 89% 90% 79%     Exam:  General: Alert, awake, oriented x3, in no acute distress.  HEENT: No bruits, no goiter.  Heart: Regular rate and rhythm, no appreciated JVD 3+ edema in his lower extremities. Lungs: Good air movement, clear Abdomen: Soft, nontender, nondistended, positive bowel sounds.    Data Reviewed: Basic Metabolic Panel:  Recent Labs Lab 05/05/14 0310 05/06/14 0512 05/07/14 0550 05/08/14 0336 05/09/14 0429  NA 138 138 138 138 135  K 3.9 3.3* 3.6 3.0* 2.9*  CL 91*  90* 91* 80* 65*  CO2 41* 38* 42* >50* 49*  GLUCOSE 111* 114* 100* 174* 99  BUN 15 12 10 8 11   CREATININE 0.78 0.67 0.67 0.75 0.78  CALCIUM 8.4 8.4 8.2* 8.7 9.9  MG 1.8 1.8 1.8 1.7 1.9   Liver Function Tests:  Recent Labs Lab 05/05/14 0310 05/06/14 0512 05/07/14 0550 05/08/14 0336 05/09/14 0429  AST 376* 206* 117* 80* 66*  ALT 503* 353* 255* 201* 173*  ALKPHOS 77 72 71 73 78  BILITOT 0.8 0.7 0.6 0.9 1.4*  PROT 7.8 6.9 7.6 7.5 8.1  ALBUMIN 2.8* 2.5*  2.7* 2.7* 2.9*   No results for input(s): LIPASE, AMYLASE in the last 168 hours. No results for input(s): AMMONIA in the last 168 hours. CBC:  Recent Labs Lab 05/05/14 0310 05/06/14 0512 05/07/14 0550 05/08/14 0336 05/09/14 0429  WBC 11.5* 9.8 9.8 7.9 7.2  HGB 8.2* 8.0* 8.1* 8.1* 9.1*  HCT 32.7* 32.6* 32.5* 31.8* 34.6*  MCV 73.3* 73.4* 72.9* 72.9* 71.6*  PLT 354 340 292 267 272   Cardiac Enzymes:  Recent Labs Lab 05/03/14 2324 05/04/14 0420 05/04/14 0952  TROPONINI <0.03 0.04* 0.03   BNP (last 3 results)  Recent Labs  03/28/14 0115 04/28/14 1919 05/03/14 1813  BNP 116.1* 371.5* 451.1*    ProBNP (last 3 results) No results for input(s): PROBNP in the last 8760 hours.  CBG:  Recent Labs Lab 05/08/14 0038 05/08/14 0733 05/08/14 1200 05/08/14 1641 05/08/14 2146  GLUCAP 108* 91 108* 114* 113*    Recent Results (from the past 240 hour(s))  MRSA PCR Screening     Status: None   Collection Time: 05/07/14  6:57 PM  Result Value Ref Range Status   MRSA by PCR NEGATIVE NEGATIVE Final    Comment:        The GeneXpert MRSA Assay (FDA approved for NASAL specimens only), is one component of a comprehensive MRSA colonization surveillance program. It is not intended to diagnose MRSA infection nor to guide or monitor treatment for MRSA infections.      Studies: No results found.  Scheduled Meds: . antiseptic oral rinse  7 mL Mouth Rinse BID  . aspirin EC  81 mg Oral Daily  . diltiazem  120 mg Oral Daily  . ferrous sulfate  325 mg Oral TID WC  . gabapentin  600 mg Oral BID  . insulin aspart  0-5 Units Subcutaneous QHS  . insulin aspart  0-9 Units Subcutaneous TID WC  . levothyroxine  150 mcg Oral QAC breakfast  . metolazone  5 mg Oral BID  . nicotine  21 mg Transdermal Daily  . potassium chloride  10 mEq Intravenous Q1 Hr x 3  . potassium chloride      . potassium chloride  40 mEq Oral TID  . Rivaroxaban  15 mg Oral BID WC  . [START ON 05/20/2014]  rivaroxaban  20 mg Oral Q supper  . sodium chloride  3 mL Intravenous Q12H  . sodium chloride  3 mL Intravenous Q12H   Continuous Infusions: . furosemide (LASIX) infusion 10 mg/hr (05/08/14 2326)     Charlynne Cousins  [facility] Pager [phone] If 7PM-7AM, please contact night-coverage at [email or web address], password [facility] 05/09/2014, 7:14 AM  LOS: 6 days

## 2014-05-09 NOTE — Progress Notes (Signed)
Pt. States he can place his cpap on when he is ready. RT made sure 02 was connected for pt.

## 2014-05-10 ENCOUNTER — Other Ambulatory Visit: Payer: Self-pay | Admitting: Family Medicine

## 2014-05-10 DIAGNOSIS — J810 Acute pulmonary edema: Secondary | ICD-10-CM

## 2014-05-10 DIAGNOSIS — I5023 Acute on chronic systolic (congestive) heart failure: Secondary | ICD-10-CM | POA: Diagnosis present

## 2014-05-10 DIAGNOSIS — I50813 Acute on chronic right heart failure: Secondary | ICD-10-CM | POA: Diagnosis present

## 2014-05-10 DIAGNOSIS — Z7901 Long term (current) use of anticoagulants: Secondary | ICD-10-CM

## 2014-05-10 LAB — COMPREHENSIVE METABOLIC PANEL
ALBUMIN: 3.1 g/dL — AB (ref 3.5–5.2)
ALT: 148 U/L — ABNORMAL HIGH (ref 0–53)
AST: 65 U/L — ABNORMAL HIGH (ref 0–37)
Alkaline Phosphatase: 74 U/L (ref 39–117)
Anion gap: 15 (ref 5–15)
BUN: 14 mg/dL (ref 6–23)
CALCIUM: 10.1 mg/dL (ref 8.4–10.5)
CO2: 46 mmol/L (ref 19–32)
Chloride: 74 mmol/L — ABNORMAL LOW (ref 96–112)
Creatinine, Ser: 0.84 mg/dL (ref 0.50–1.35)
GFR calc Af Amer: >90 mL/min (ref >90)
GFR calc non Af Amer: >90 mL/min (ref >90)
Glucose, Bld: 99 mg/dL (ref 70–99)
POTASSIUM: 3.1 mmol/L — AB (ref 3.5–5.1)
SODIUM: 135 mmol/L (ref 135–145)
Total Bilirubin: 1.3 mg/dL — ABNORMAL HIGH (ref 0.3–1.2)
Total Protein: 8 g/dL (ref 6.0–8.3)

## 2014-05-10 LAB — POCT I-STAT 3, ART BLOOD GAS (G3+)
Bicarbonate: 60.9 mEq/L — ABNORMAL HIGH (ref 20.0–24.0)
O2 Saturation: 79 %
Patient temperature: 98.6
TCO2: >50 mmol/L (ref 0–100)
pCO2 arterial: 67.7 mmHg (ref 35.0–45.0)
pH, Arterial: 7.562 — ABNORMAL HIGH (ref 7.350–7.450)
pO2, Arterial: 41 mmHg — ABNORMAL LOW (ref 80.0–100.0)

## 2014-05-10 LAB — CARBOXYHEMOGLOBIN
Carboxyhemoglobin: 2.7 % — ABNORMAL HIGH (ref 0.5–1.5)
Methemoglobin: 0.8 % (ref 0.0–1.5)
O2 Saturation: 68.2 %
Total hemoglobin: 9.7 g/dL — ABNORMAL LOW (ref 13.5–18.0)

## 2014-05-10 LAB — MAGNESIUM: Magnesium: 2.1 mg/dL (ref 1.5–2.5)

## 2014-05-10 LAB — GLUCOSE, CAPILLARY
Glucose-Capillary: 112 mg/dL — ABNORMAL HIGH (ref 70–99)
Glucose-Capillary: 93 mg/dL (ref 70–99)

## 2014-05-10 LAB — CBC
HEMATOCRIT: 37.4 % — AB (ref 39.0–52.0)
HEMOGLOBIN: 9.5 g/dL — AB (ref 13.0–17.0)
MCH: 18.4 pg — AB (ref 26.0–34.0)
MCHC: 25.4 g/dL — AB (ref 30.0–36.0)
MCV: 72.3 fL — AB (ref 78.0–100.0)
Platelets: 273 10*3/uL (ref 150–400)
RBC: 5.17 MIL/uL (ref 4.22–5.81)
RDW: 24.5 % — ABNORMAL HIGH (ref 11.5–15.5)
WBC: 7.4 10*3/uL (ref 4.0–10.5)

## 2014-05-10 MED ORDER — POTASSIUM CHLORIDE 10 MEQ/100ML IV SOLN
10.0000 meq | INTRAVENOUS | Status: AC
  Administered 2014-05-10: 10 meq via INTRAVENOUS
  Filled 2014-05-10: qty 100

## 2014-05-10 MED ORDER — POTASSIUM CHLORIDE 10 MEQ/50ML IV SOLN
INTRAVENOUS | Status: AC
  Administered 2014-05-10: 10 meq
  Filled 2014-05-10: qty 50

## 2014-05-10 MED ORDER — FERROUS SULFATE 325 (65 FE) MG PO TABS: 325.0000 mg | ORAL_TABLET | Freq: Three times a day (TID) | ORAL | Status: DC

## 2014-05-10 MED ORDER — TORSEMIDE 20 MG PO TABS: 20.0000 mg | ORAL_TABLET | Freq: Two times a day (BID) | ORAL | Status: DC

## 2014-05-10 MED ORDER — POTASSIUM CHLORIDE ER 20 MEQ PO TBCR: 20.0000 meq | EXTENDED_RELEASE_TABLET | Freq: Every day | ORAL | Status: DC

## 2014-05-10 MED ORDER — LEVOTHYROXINE SODIUM 150 MCG PO TABS: 150.0000 ug | ORAL_TABLET | Freq: Every day | ORAL | Status: DC

## 2014-05-10 MED ORDER — NICOTINE 21 MG/24HR TD PT24: 21.0000 mg | MEDICATED_PATCH | Freq: Every day | TRANSDERMAL | Status: DC

## 2014-05-10 MED ORDER — POTASSIUM CHLORIDE CRYS ER 20 MEQ PO TBCR
40.0000 meq | EXTENDED_RELEASE_TABLET | ORAL | Status: AC
  Administered 2014-05-10 (×2): 40 meq via ORAL
  Filled 2014-05-10 (×2): qty 2

## 2014-05-10 NOTE — Discharge Summary (Signed)
Physician Discharge Summary  Dean Bowers VXB:[id number] DOB: 04-10-1970 DOA: 05/03/2014  PCP: Wyatt Haste, MD  Admit date: 05/03/2014 Discharge date: 05/10/2014  Time spent:35  minutes  Recommendations for Outpatient Follow-up:  Discharge home with outpt PCP and cardiology follow up. Please follow k level during outpt f/up  Discharge Diagnoses:  Principal Problem:   Acute on chronic respiratory failure with hypoxemia  Active Problems:   Obesity, Class III, BMI 40-49.9 (morbid obesity)   Diabetes mellitus without complication   OSA (obstructive sleep apnea)   Pulmonary edema   Hypokalemia   Anemia, iron deficiency   Acute on chronic diastolic heart failure   Elevated TSH   Acute pulmonary embolism   Anticoagulation adequate, on xarelto   Tobacco abuse   Discharge Condition: fair  Diet recommendation: heart healthy  Filed Weights   05/08/14 0435 05/09/14 0329 05/10/14 0300  Weight: 182.9 kg (403 lb 3.5 oz) 166.9 kg (367 lb 15.2 oz) 159.4 kg (351 lb 6.6 oz)    History of present illness:  44 yo male morbidly obese male with hx of hypertension, hyperlipidemia, type 2 diabetes mellitus (not on any medications), Copd (on home o2), severe OSA, on bipap, PE/DVT, polysubstance abuse-cocaine/THC apparently c/o increasing lower ext edema and dyspnea on exertion with mild orthopnea.. Pt uncertain about weight gain. Denied fever, chills, chest pain, palpitations, n/v, diarrhea, brbpr, black stoolPt presented to ED for evaluation and CXR showed pulmonary edema, with elevation in BNP , trop negative. Started on IV Lasix for diuresis. Per weights from previous admissions, his base weight is around 370-380 pounds.   Hospital Course:  Acute on chronic respiratory failure with hypoxemia due to Acute on chronic diastolic heart failure/ Acute on chronic respiratory failure with hypoxemia: - Due to noncompliance with diet and? Medications. Patient diuresed well with IV Lasix and  weight has been down to 351 pounds (diuresed almost 42 L)!! -Dennis potassium aggressively. Switch to IV Lasix to oral torsemide. Genu aspirin and Cardizem. - Appreciate cardiology consult. -Cardiologist have discussed patient about diuretic sliding scale.  instruction provided titrating torsemide dose if weight gain more than 3 pounds per day or 5 pounds in 2 days. -He should strongly counseled on diet adherence and medication compliance as well as outpatient follow-up. Cardiology will arrange for outpatient follow-up with him.  Lower extremity swelling likely secondary to acute on chronic diastolic heart failure: - The patient has a known history of DVT and PE. Compliant  with Xarelto.  Diabetes mellitus without complication - Z3A is 6.3 cont. control continue current therapy.  Transaminatis - due to passive congestion, improving with diuresis. Follow LFTs as outpatient.  Obesity, Class III, BMI 40-49.9 (morbid obesity): - Counseled on diet compliance and weight loss  Iron deficiency anemia: - Follow-up with GI as an outpatient.  - Iron supplements prescribed  New diagnosis of hypothyroidism: - High TSH and low free T4 . started diet and isn't Synthroid 150 g daily. Repeat thyroid function in 6 weeks.  Hypokalemia -Repleted. Increased home dose of potassium supplement.  Severe OSA (obstructive sleep apnea) - recently started on trilogy by PCP fr severe OSA. Sleep study done few weeks back . Has f/uo with Dr Halford Chessman next month. Patient remove the mask during the night and getting confused. Encouraged to use the machine at home.  History of right BKA status post stump revision in 04/2014 stable. Still has sutures intact. Has taken over 3 weeks of oral doxycycline and was discontinued this admission. I spoke with his  orthopedic surgeon Dr. Sharol Given who recommends dressing change prior to discharge and have patient call his office to schedule appointment for this week. Patient was  informed.  Tobacco abuse Counseled strongly on cessation. Nicotine patch prescribed  Code Status: full  Family Communication: None at bedside Disposition Plan: Discharge home with home health   Consultants:  Cardiology  Procedures: - Echocardiogram on 1 2016 poor quality showed LV systolic function is probably normal and probable diastolic dysfunction. CXR  Antibiotics:  Doxy,  stopped on 3.29.2016     Discharge Exam: Filed Vitals:   05/10/14 0800  BP:   Pulse: 96  Temp:   Resp:     General: Middle aged morbidly obese male in no acute distress HEENT: Pallor present, moist oral mucosa, supple neck, no JVD Chest: Fine right basilar crackles, no rhonchi or wheeze CVS: Normal S1 and S2, no murmurs rub or gallop GI: Soft, nondistended, nontender, bowel sounds present Musculoskeletal: Warm, right BKA, edema CNS: Alert and oriented  Discharge Instructions    Current Discharge Medication List    START taking these medications   Details  ferrous sulfate 325 (65 FE) MG tablet Take 1 tablet (325 mg total) by mouth 3 (three) times daily with meals. Qty: 90 tablet, Refills: 3    levothyroxine (SYNTHROID, LEVOTHROID) 150 MCG tablet Take 1 tablet (150 mcg total) by mouth daily before breakfast. Qty: 30 tablet, Refills: 2    nicotine (NICODERM CQ - DOSED IN MG/24 HOURS) 21 mg/24hr patch Place 1 patch (21 mg total) onto the skin daily. Qty: 28 patch, Refills: 0    torsemide (DEMADEX) 20 MG tablet Take 1 tablet (20 mg total) by mouth 2 (two) times daily. Qty: 60 tablet, Refills: 0      CONTINUE these medications which have CHANGED   Details  potassium chloride 20 MEQ TBCR Take 20 mEq by mouth daily. Qty: 30 tablet, Refills: 0      CONTINUE these medications which have NOT CHANGED   Details  acetaminophen (TYLENOL) 500 MG tablet Take 1,000 mg by mouth daily as needed for headache.    ALPRAZolam (XANAX) 0.5 MG tablet TAKE 1 TABLET BY MOUTH THREE TIMES DAILY AS  NEEDED FOR ANXIETY Qty: 30 tablet, Refills: 1    aspirin EC 81 MG EC tablet Take 1 tablet (81 mg total) by mouth daily.    diltiazem (CARDIZEM CD) 120 MG 24 hr capsule Take 1 capsule (120 mg total) by mouth daily. Qty: 30 capsule, Refills: 11         Elastic Bandages & Supports (MEDICAL COMPRESSION SOCKS) MISC 2 each by Does not apply route daily. Qty: 2 each, Refills: 5    gabapentin (NEURONTIN) 600 MG tablet TAKE 1 TABLET BY MOUTH TWICE DAILY Qty: 60 tablet, Refills: 5    Multiple Vitamins-Minerals (MULTIVITAMIN WITH MINERALS) tablet Take 1 tablet by mouth daily.    oxyCODONE-acetaminophen (PERCOCET) 10-325 MG per tablet Take 1 tablet by mouth every 8 (eight) hours as needed for pain. Qty: 90 tablet, Refills: 0    silver sulfADIAZINE (SILVADENE) 1 % cream Apply 1 application topically daily as needed (affected area).     XARELTO STARTER PACK 15 & 20 MG TBPK Take 15-20 mg by mouth as directed. Take as directed on package: Start with one 15mg  tablet by mouth twice a day with food. On Day 22, switch to one 20mg  tablet once a day with food. Qty: 51 each, Refills: 0    PROVENTIL HFA 108 (90 BASE) MCG/ACT inhaler  INHALE 2 PUFFS BY MOUTH EVERY 6 HOURS AS NEEDED FOR WHEEZING OR SHORTNESS OF BREATH Qty: 6.7 g, Refills: 0      STOP taking these medications     furosemide (LASIX) 40 MG tablet   doxycycline (DORYX) 100 MG DR capsule     Allergies  Allergen Reactions  . Lyrica [Pregabalin] Other (See Comments)    Hallucinations    Follow-up Information    Follow up with Wyatt Haste, MD. Schedule an appointment as soon as possible for a visit on 05/12/2014.   Specialty:  Family Medicine   Contact information:   [address] [place] [address] [phone]       Follow up with SOOD,VINEET, MD On 06/16/2014.   Specialty:  Pulmonary Disease   Why:  3:45 pm   Contact information:   [address] [place] [address] [phone]      Discharge  instruction: Weigh yourself when you get home, then Daily in the Morning. Your dry weight will be what your scale says on the day you return home.(here is 351 lbs.).   If you gain more than 3 pounds from dry weight: Increase the Torsemide dosing to 40 mg in the morning and 20 mg in the afternoon until weight returns to baseline dry weight.  If weight gain is greater than 5 pounds in 2 days: Increased to Lasix 40 mg twice a day and contact the office for further assistance if weight does not go down the next day.  If the weight goes down more than 3 pounds from dry weight: Hold Lasix until it returns to baseline dry weight  The results of significant diagnostics from this hospitalization (including imaging, microbiology, ancillary and laboratory) are listed below for reference.    Significant Diagnostic Studies: Dg Chest 2 View  05/03/2014   CLINICAL DATA:  43 year old male with acute shortness of breath.  EXAM: CHEST  2 VIEW  COMPARISON:  04/15/2014 and prior chest radiographs  FINDINGS: Cardiomegaly, pulmonary vascular congestion and interstitial pulmonary edema noted.  There is no evidence of pneumothorax.  A small to moderate right pleural effusion is noted.  No acute bony abnormalities are identified.  IMPRESSION: Cardiomegaly with interstitial pulmonary edema and small to moderate right pleural effusion.   Electronically Signed   By: Margarette Canada M.D.   On: 05/03/2014 20:26   Ct Angio Chest Pe W/cm &/or Wo Cm  04/28/2014   CLINICAL DATA:  Dyspnea and upper extremity swelling  EXAM: CT ANGIOGRAPHY CHEST WITH CONTRAST  TECHNIQUE: Multidetector CT imaging of the chest was performed using the standard protocol during bolus administration of intravenous contrast. Multiplanar CT image reconstructions and MIPs were obtained to evaluate the vascular anatomy.  CONTRAST:  164mL OMNIPAQUE IOHEXOL 350 MG/ML SOLN  COMPARISON:  Chest CT May 15, 2013 and chest radiograph April 15, 2014  FINDINGS: There are  small peripheral pulmonary emboli subsegmental left lower lobe branches. No larger more central pulmonary emboli appreciated on this study. There is no thoracic aortic aneurysm or dissection.  There is a moderate right pleural effusion with right base consolidation. There is a small area of infiltrate in the medial segment of the right middle lobe adjacent to the right heart border.  There is a stable with 3 mm nodular opacity in the lateral segment right middle lobe seen on slice 46 series 12. There is a 4 mm nodular opacity also in the lateral segment of the right middle lobe seen on slice 47 series 12.  The previously noted 4 mm nodular opacity in the left lower lobe is not seen on this examination.  Heart is enlarged with scattered foci of left anterior descending coronary artery calcification. There is no pericardial effusion.  There is a lymph node in the sub- carinal region measuring 3.0 x 1.9 cm. There is a second sub- carinal lymph node measuring 2.6 x 1.9 cm. No other adenopathy is appreciable.  In the visualized upper abdomen, there is atherosclerotic change in the aorta.  There is postoperative change in the lower thoracic and upper lumbar spine regions. There are no blastic or lytic bone lesions. Thyroid appears unremarkable.  Review of the MIP images confirms the above findings.  IMPRESSION: Small peripheral left lower lobe pulmonary emboli. No central pulmonary embolus appreciable.  Moderate right effusion with right base consolidation. Smaller area of consolidation right middle lobe medially.  Sub- carinal adenopathy.  No other adenopathy.  Cardiomegaly.  Coronary artery calcification.  Stable small pulmonary nodular opacities.  Critical Value/emergent results were called by telephone at the time of interpretation on 04/28/2014 at 8:58 pm to Dr. Ernestina Patches , who verbally acknowledged these results.   Electronically Signed   By: Lowella Grip III M.D.   On: 04/28/2014 21:00   Dg Chest Port 1  View  04/15/2014   CLINICAL DATA:  Lethargy, diaphoresis, renal failure, COPD, respiratory failure, hypoxia, post BKA, diabetes, hypertension, smoker  EXAM: PORTABLE CHEST - 1 VIEW  COMPARISON:  Portable exam 1631 hours compared to 03/28/2014  FINDINGS: Enlargement of cardiac silhouette with pulmonary vascular congestion.  Improved perihilar opacity since previous exam question improved edema or infiltrate.  No pleural effusion or pneumothorax.  Bones unremarkable.  IMPRESSION: Enlargement of cardiac silhouette with pulmonary vascular congestion.  Improved pulmonary infiltrates versus 03/28/2014.   Electronically Signed   By: Lavonia Dana M.D.   On: 04/15/2014 17:00    Microbiology: Recent Results (from the past 240 hour(s))  MRSA PCR Screening     Status: None   Collection Time: 05/07/14  6:57 PM  Result Value Ref Range Status   MRSA by PCR NEGATIVE NEGATIVE Final    Comment:        The GeneXpert MRSA Assay (FDA approved for NASAL specimens only), is one component of a comprehensive MRSA colonization surveillance program. It is not intended to diagnose MRSA infection nor to guide or monitor treatment for MRSA infections.      Labs: Basic Metabolic Panel:  Recent Labs Lab 05/06/14 0512 05/07/14 0550 05/08/14 0336 05/09/14 0429 05/09/14 2059 05/10/14 0403  NA 138 138 138 135  --  135  K 3.3* 3.6 3.0* 2.9* 2.7* 3.1*  CL 90* 91* 80* 65*  --  74*  CO2 38* 42* >50* 49*  --  46*  GLUCOSE 114* 100* 174* 99  --  99  BUN 12 10 8 11   --  14  CREATININE 0.67 0.67 0.75 0.78  --  0.84  CALCIUM 8.4 8.2* 8.7 9.9  --  10.1  MG 1.8 1.8 1.7 1.9  --  2.1   Liver Function Tests:  Recent Labs Lab 05/06/14 0512 05/07/14 0550 05/08/14 0336 05/09/14 0429 05/10/14 0403  AST 206* 117* 80* 66* 65*  ALT 353* 255* 201* 173* 148*  ALKPHOS 72 71 73 78 74  BILITOT 0.7 0.6 0.9 1.4* 1.3*  PROT 6.9 7.6 7.5 8.1 8.0  ALBUMIN 2.5* 2.7* 2.7* 2.9* 3.1*   No results for input(s): LIPASE, AMYLASE in  the last 168  hours. No results for input(s): AMMONIA in the last 168 hours. CBC:  Recent Labs Lab 05/06/14 0512 05/07/14 0550 05/08/14 0336 05/09/14 0429 05/10/14 0403  WBC 9.8 9.8 7.9 7.2 7.4  HGB 8.0* 8.1* 8.1* 9.1* 9.5*  HCT 32.6* 32.5* 31.8* 34.6* 37.4*  MCV 73.4* 72.9* 72.9* 71.6* 72.3*  PLT 340 292 267 272 273   Cardiac Enzymes:  Recent Labs Lab 05/03/14 2324 05/04/14 0420 05/04/14 0952  TROPONINI <0.03 0.04* 0.03   BNP: BNP (last 3 results)  Recent Labs  03/28/14 0115 04/28/14 1919 05/03/14 1813  BNP 116.1* 371.5* 451.1*    ProBNP (last 3 results) No results for input(s): PROBNP in the last 8760 hours.  CBG:  Recent Labs Lab 05/09/14 0805 05/09/14 1151 05/09/14 1654 05/09/14 2125 05/10/14 0720  GLUCAP 120* 120* 110* 108* 112*       Signed:  Mattia Osterman  [facility] 05/10/2014, 10:16 AM

## 2014-05-10 NOTE — Discharge Instructions (Signed)

## 2014-05-10 NOTE — Progress Notes (Signed)
DAILY PROGRESS NOTE  Subjective:  Continued Brisk diuresis - following discontinuation of Lasix drip. Started on torsemide twice a day yesterday Now out ~43.5L negative.  Weight is down to 351 pounds today - consistent with 85+ pounds diuresis Marked hypokalemia - improved with supplementation  Objective:  Temp:  [97.5 F (36.4 C)-98.8 F (37.1 C)] 98.8 F (37.1 C) (04/04 1241) Pulse Rate:  [93-101] 99 (04/04 1241) Resp:  [14-28] 24 (04/04 1241) BP: (107-126)/(40-62) 126/48 mmHg (04/04 1241) SpO2:  [76 %-99 %] 95 % (04/04 1241) Weight:  [351 lb 6.6 oz (159.4 kg)] 351 lb 6.6 oz (159.4 kg) (04/04 0300) Weight change: -16 lb 8.6 oz (-7.5 kg)  Intake/Output from previous day: 04/03 0701 - 04/04 0700 In: 1067.2 [P.O.:680; I.V.:87.2; IV Piggyback:300] Out: 3125 [Urine:3125]  Intake/Output from this shift: Total I/O In: 870 [P.O.:720; IV Piggyback:150] Out: 1350 [Urine:1350]  Medications: Current Facility-Administered Medications  Medication Dose Route Frequency Provider Last Rate Last Dose  . 0.9 %  sodium chloride infusion  250 mL Intravenous PRN Jani Gravel, MD   Stopped at 05/10/14 0700  . albuterol (PROVENTIL) (2.5 MG/3ML) 0.083% nebulizer solution 2.5 mg  2.5 mg Inhalation Q6H PRN Jani Gravel, MD      . ALPRAZolam Duanne Moron) tablet 0.5 mg  0.5 mg Oral TID PRN Jani Gravel, MD   0.5 mg at 05/07/14 2153  . antiseptic oral rinse (CPC / CETYLPYRIDINIUM CHLORIDE 0.05%) solution 7 mL  7 mL Mouth Rinse BID Charlynne Cousins, MD   7 mL at 05/10/14 1000  . aspirin EC tablet 81 mg  81 mg Oral Daily Jani Gravel, MD   81 mg at 05/10/14 1123  . diltiazem (CARDIZEM CD) 24 hr capsule 120 mg  120 mg Oral Daily Jani Gravel, MD   120 mg at 05/10/14 1123  . ferrous sulfate tablet 325 mg  325 mg Oral TID WC Janece Canterbury, MD   325 mg at 05/10/14 1123  . gabapentin (NEURONTIN) tablet 600 mg  600 mg Oral BID Jani Gravel, MD   600 mg at 05/10/14 1122  . insulin aspart (novoLOG) injection 0-5 Units  0-5  Units Subcutaneous QHS Jani Gravel, MD   0 Units at 05/03/14 2336  . insulin aspart (novoLOG) injection 0-9 Units  0-9 Units Subcutaneous TID WC Jani Gravel, MD   0 Units at 05/04/14 0700  . levothyroxine (SYNTHROID, LEVOTHROID) tablet 150 mcg  150 mcg Oral QAC breakfast Janece Canterbury, MD   150 mcg at 05/10/14 [phone]  . nicotine (NICODERM CQ - dosed in mg/24 hours) patch 21 mg  21 mg Transdermal Daily Charlynne Cousins, MD   21 mg at 05/10/14 1124  . oxyCODONE-acetaminophen (PERCOCET/ROXICET) 5-325 MG per tablet 1 tablet  1 tablet Oral Q8H PRN Jani Gravel, MD   1 tablet at 05/10/14 1122   And  . oxyCODONE (Oxy IR/ROXICODONE) immediate release tablet 5 mg  5 mg Oral Q8H PRN Jani Gravel, MD   5 mg at 05/10/14 1122  . Rivaroxaban (XARELTO) tablet 15 mg  15 mg Oral BID WC Darnell Level Mancheril, RPH   15 mg at 05/10/14 0820  . [START ON 05/20/2014] rivaroxaban (XARELTO) tablet 20 mg  20 mg Oral Q supper Darnell Level Mancheril, RPH      . sodium chloride 0.9 % injection 10-40 mL  10-40 mL Intracatheter PRN Charlynne Cousins, MD   10 mL at 05/06/14 0511  . sodium chloride 0.9 % injection 3 mL  3 mL Intravenous  Q12H Jani Gravel, MD   10 mL at 05/08/14 1117  . sodium chloride 0.9 % injection 3 mL  3 mL Intravenous Q12H Jani Gravel, MD   3 mL at 05/09/14 2010  . sodium chloride 0.9 % injection 3 mL  3 mL Intravenous PRN Jani Gravel, MD      . torsemide [facility]) tablet 20 mg  20 mg Oral BID Pixie Casino, MD   20 mg at 05/10/14 2993   Facility-Administered Medications Ordered in Other Encounters  Medication Dose Route Frequency Provider Last Rate Last Dose  . phenylephrine (NEO-SYNEPHRINE) injection    Anesthesia Intra-op Nolon Nations, MD   40 mcg at 03/26/14 1315  . propofol (DIPRIVAN) 10 mg/mL bolus/IV push    Anesthesia Intra-op Nolon Nations, MD   200 mg at 02/08/14 0414  . succinylcholine (ANECTINE) injection    Anesthesia Intra-op Nolon Nations, MD   180 mg at 03/26/14 1302   Physical Exam: General  appearance: alert and morbidly obese Neck: no carotid bruit and neck thick Lungs: diminished breath sounds bilaterally Heart: regular rate and rhythm Abdomen: morbidly obese Extremities: right BKA (recent), 3+ edema Pulses: Left Dorsalis Pedis present 1+ Skin: LLE erythma -multiple tattoos on all extremities Neurologic: Mental status: Alert, oriented, thought content appropriate, follows commands Psych: Frustruated, wants to go home  Lab Results: reviewed in Epic Lab Results  Component Value Date   CREATININE 0.84 05/10/2014   Lab Results  Component Value Date   K 3.1* 05/10/2014   Lab Results  Component Value Date   HGB 9.5* 05/10/2014  LFTs remain mildly elevated, but stable to improved  Imaging: No results found.  Assessment: Principal Problem:   Acute on chronic respiratory failure with hypoxemia Active Problems:   Obesity, Class III, BMI 40-49.9 (morbid obesity)   Diabetes mellitus without complication   OSA (obstructive sleep apnea)   Pulmonary edema   Hypokalemia   Anemia, iron deficiency   Acute on chronic diastolic heart failure   Elevated TSH   Acute pulmonary embolism   Anticoagulation adequate, on xarelto   Tobacco abuse   Acute on chronic systolic right heart failure   Plan: Overall very brisk diuresis with the main issue being hypokalemia. He is on replacement therapy and is now been converted to oral torsemide. Primary service is planning on discharge later on today.He will need close follow-up with his cardiologist (Dr. Vilma Meckel APP. I actually suspect the majority of his symptoms are right heart failure related secondary to possible obesity hypoventilation syndrome.  Inverted oral torsemide yesterday for better GI uptake. We discussed sliding scale diuresis based on home weight. His current weight should be his new dry weight. Sliding scale Lasix: Weigh yourself when you get home, then Daily in the Morning. Your dry weight will be what your scale  says on the day you return home.(here is 351 lbs.).   If you gain more than 3 pounds from dry weight: Increase the Torsemide dosing to 40 mg in the morning and 20 mg in the afternoon until weight returns to baseline dry weight.  If weight gain is greater than 5 pounds in 2 days: Increased to Lasix 40 mg twice a day and contact the office for further assistance if weight does not go down the next day.  If the weight goes down more than 3 pounds from dry weight: Hold Lasix until it returns to baseline dry weight  Remains on calcium channel blocker for pulmonary hypertension. On Xarelto for DVT/PE Blood pressure  has been relatively stable to low. Would consider addition of afterload reduction - ACE inhibitor/ARB in the outpatient setting.  Time Spent Directly with Patient:  15 minutes  Length of Stay:  LOS: 7 days    Yusra Ravert W 05/10/2014, 2:26 PM

## 2014-05-10 NOTE — Progress Notes (Signed)
Notified md about pt's potassium of 3.1 and co2 46.  New orders received. Will continue to monitor. Dean Bowers

## 2014-05-10 NOTE — Progress Notes (Signed)
Pt pulled cpap off and was very confused and combative.  Pulled ekg monitor off.  md called along with security.  Had order for restraints but did not have to use them. Pt became more aware and followed commands.  Placed back on cpap and ekg leads placed back on pt.  Bed alarm on.   Will continue to monitor. Dean Bowers

## 2014-05-11 ENCOUNTER — Telehealth: Payer: Self-pay

## 2014-05-11 NOTE — Telephone Encounter (Signed)
Patient was informed to bring all meds in at appointment patient agreed

## 2014-05-12 ENCOUNTER — Ambulatory Visit (INDEPENDENT_AMBULATORY_CARE_PROVIDER_SITE_OTHER): Payer: PRIVATE HEALTH INSURANCE | Admitting: Family Medicine

## 2014-05-12 ENCOUNTER — Telehealth: Payer: Self-pay | Admitting: Cardiology

## 2014-05-12 ENCOUNTER — Encounter: Payer: Self-pay | Admitting: Family Medicine

## 2014-05-12 VITALS — BP 114/60 | HR 110

## 2014-05-12 DIAGNOSIS — I5033 Acute on chronic diastolic (congestive) heart failure: Secondary | ICD-10-CM | POA: Diagnosis not present

## 2014-05-12 DIAGNOSIS — M549 Dorsalgia, unspecified: Secondary | ICD-10-CM

## 2014-05-12 DIAGNOSIS — I82622 Acute embolism and thrombosis of deep veins of left upper extremity: Secondary | ICD-10-CM | POA: Diagnosis not present

## 2014-05-12 DIAGNOSIS — J9621 Acute and chronic respiratory failure with hypoxia: Secondary | ICD-10-CM

## 2014-05-12 DIAGNOSIS — G8929 Other chronic pain: Secondary | ICD-10-CM

## 2014-05-12 MED ORDER — TRAMADOL HCL 50 MG PO TABS: 50.0000 mg | ORAL_TABLET | Freq: Four times a day (QID) | ORAL | Status: DC | PRN

## 2014-05-12 NOTE — Telephone Encounter (Signed)
Discharge instruction: Weigh yourself when you get home, then Daily in the Morning. Your dry weight will be what your scale says on the day you return home.(here is 351 lbs.).   If you gain more than 3 pounds from dry weight: Increase the Torsemide dosing to 40 mg in the morning and 20 mg in the afternoon until weight returns to baseline dry weight.  If weight gain is greater than 5 pounds in 2 days: Increased to Lasix 40 mg twice a day and contact the office for further assistance if weight does not go down the next day.  If the weight goes down more than 3 pounds from dry weight: Hold Lasix until it returns to baseline dry weight  Confusion over torsemide and furosemide (lasix) - torsemide 20mg  BID is new diuretic - lasix was d/c'ed at hospital discharge. Clarified with Verdis Frederickson, RN that diuretic sliding scale instructions should reflect change in medication and has same instructions, just with torsemide

## 2014-05-12 NOTE — Progress Notes (Signed)
   Subjective:    Patient ID: Dean Bowers, male    DOB: November 30, 1970, 44 y.o.   MRN: [id number]  HPI He is here for recheck after recent hospitalization. While in the hospital he was diuresed over 50 pounds in a relatively short period of time. He was also placed on special CPAP (Trilogy) to help with his underlying OSA. Last night he used this at home and slept for approximately 7 hours. He did note an improvement in his energy level and is not falling asleep during the day.he is no longer smoking but is using the E cigarette. He does complain of back pain and would like some more pain medication.   Review of Systems     Objective:   Physical Exam Alert and in no distress. No palpable pitting edema is noted. Lungs are clear to auscultation. Exam of the left arm shows no swelling or palpable tenderness.       Assessment & Plan:  Obesity, Class III, BMI 40-49.9 (morbid obesity) - Plan: CBC with Differential/Platelet, Comprehensive metabolic panel  Acute on chronic respiratory failure with hypoxia - Plan: CBC with Differential/Platelet, Comprehensive metabolic panel, Ambulatory referral to Cardiology  DVT of upper extremity (deep vein thrombosis), left  Chronic back pain - Plan: Ambulatory referral to Physical Therapy, traMADol (ULTRAM) 50 MG tablet  Acute on chronic diastolic heart failure  Acute on chronic respiratory failure with hypoxemia he will continue on his present medication regimen as well as the new OSA treatment. We will set him up to be seen by cardiology. Follow-up next week with blood work. He is to continue to monitor his weight and prescription written to give him a scale. Discussed pain management with him and refer to physical therapy to help with his back pain which is a residual from his previous accident. Explained that I did not want to continue him on codeine. We will try him on tramadol.

## 2014-05-12 NOTE — Telephone Encounter (Signed)
Dean Bowers is calling because she is needing medication clarification on the lasix and torsemide . Please call   Thanks

## 2014-05-13 ENCOUNTER — Other Ambulatory Visit: Payer: Self-pay

## 2014-05-17 ENCOUNTER — Encounter: Payer: Self-pay | Admitting: Family Medicine

## 2014-05-17 ENCOUNTER — Ambulatory Visit (INDEPENDENT_AMBULATORY_CARE_PROVIDER_SITE_OTHER): Payer: PRIVATE HEALTH INSURANCE | Admitting: Family Medicine

## 2014-05-17 ENCOUNTER — Other Ambulatory Visit: Payer: Self-pay

## 2014-05-17 VITALS — BP 130/70 | HR 95

## 2014-05-17 DIAGNOSIS — J9621 Acute and chronic respiratory failure with hypoxia: Secondary | ICD-10-CM

## 2014-05-17 DIAGNOSIS — I10 Essential (primary) hypertension: Secondary | ICD-10-CM | POA: Diagnosis not present

## 2014-05-17 DIAGNOSIS — D509 Iron deficiency anemia, unspecified: Secondary | ICD-10-CM

## 2014-05-17 DIAGNOSIS — G8929 Other chronic pain: Secondary | ICD-10-CM

## 2014-05-17 DIAGNOSIS — M549 Dorsalgia, unspecified: Secondary | ICD-10-CM | POA: Diagnosis not present

## 2014-05-17 DIAGNOSIS — E876 Hypokalemia: Secondary | ICD-10-CM | POA: Diagnosis not present

## 2014-05-17 DIAGNOSIS — I50810 Right heart failure, unspecified: Secondary | ICD-10-CM

## 2014-05-17 LAB — COMPREHENSIVE METABOLIC PANEL
ALBUMIN: 3.4 g/dL — AB (ref 3.5–5.2)
ALT: 79 U/L — ABNORMAL HIGH (ref 0–53)
AST: 46 U/L — ABNORMAL HIGH (ref 0–37)
Alkaline Phosphatase: 73 U/L (ref 39–117)
BUN: 18 mg/dL (ref 6–23)
CALCIUM: 9 mg/dL (ref 8.4–10.5)
CHLORIDE: 91 meq/L — AB (ref 96–112)
CO2: 37 mEq/L — ABNORMAL HIGH (ref 19–32)
Creat: 0.75 mg/dL (ref 0.50–1.35)
GLUCOSE: 94 mg/dL (ref 70–99)
Potassium: 3.6 mEq/L (ref 3.5–5.3)
Sodium: 135 mEq/L (ref 135–145)
Total Bilirubin: 0.6 mg/dL (ref 0.2–1.2)
Total Protein: 8 g/dL (ref 6.0–8.3)

## 2014-05-17 MED ORDER — DICLOFENAC SODIUM 75 MG PO TBEC: 75.0000 mg | DELAYED_RELEASE_TABLET | Freq: Two times a day (BID) | ORAL | Status: DC

## 2014-05-17 MED ORDER — HYDROCODONE-ACETAMINOPHEN 7.5-325 MG PO TABS: 1.0000 | ORAL_TABLET | Freq: Four times a day (QID) | ORAL | Status: DC | PRN

## 2014-05-17 NOTE — Progress Notes (Signed)
   Subjective:    Patient ID: Dean Bowers, male    DOB: 1970-04-20, 44 y.o.   MRN: [id number]  HPI He is here for follow-up visit. He had his sleep apnea machine readjusted and states that he is waking up refreshed with more energy. He states his weight is stable but he is having difficult time waking himself due to his disability. He has noted decreased swelling in his left foot and canals has ankle. He continues to complain of mid back pain in the area that he had his previous back surgery. He states the tramadol as well as Aleve and ibuprofen have not been helpful. He has a follow-up appointment with Dr. Sharol Given on April 20. He is still having some stomach discomfort. He has had electrolyte problems as well as fluid issues due to his weight gain and subsequent weight loss with him being placed on his present diuretic medication.   Review of Systems     Objective:   Physical Exam Alert and in no distress otherwise not examined       Assessment & Plan:  Obesity, Class III, BMI 40-49.9 (morbid obesity)  Acute on chronic respiratory failure with hypoxia  Hypokalemia - Plan: CBC with Differential/Platelet, Comprehensive metabolic panel  Essential hypertension - Plan: CBC with Differential/Platelet, Comprehensive metabolic panel  Anemia, iron deficiency - Plan: Comprehensive metabolic panel  Chronic back pain - Plan: diclofenac (VOLTAREN) 75 MG EC tablet, HYDROcodone-acetaminophen (NORCO) 7.5-325 MG per tablet He is to stop taking his tramadol. I will try him on Voltaren. Discussed possible risks in terms of bleeding. Encouraged him to use the Norco very sparingly and discussed my reservations with keeping him on codeine medication long-term. Once he gets his prosthetic device of would like to get him involved in physical therapy to help strengthen his back.

## 2014-05-17 NOTE — Patient Instructions (Addendum)
No more tramadol, Advil or Aleve When she get the brace let me know and we'll set you up for physical therapy

## 2014-05-18 ENCOUNTER — Telehealth: Payer: Self-pay

## 2014-05-18 ENCOUNTER — Telehealth: Payer: Self-pay | Admitting: Cardiology

## 2014-05-18 ENCOUNTER — Other Ambulatory Visit: Payer: PRIVATE HEALTH INSURANCE

## 2014-05-18 LAB — CBC WITH DIFFERENTIAL/PLATELET
Basophils Absolute: 0.1 10*3/uL (ref 0.0–0.1)
Basophils Relative: 1 % (ref 0–1)
EOS ABS: 0.2 10*3/uL (ref 0.0–0.7)
EOS PCT: 2 % (ref 0–5)
HCT: 40.1 % (ref 39.0–52.0)
Hemoglobin: 11 g/dL — ABNORMAL LOW (ref 13.0–17.0)
Lymphocytes Relative: 24 % (ref 12–46)
Lymphs Abs: 1.8 10*3/uL (ref 0.7–4.0)
MCH: 19.8 pg — ABNORMAL LOW (ref 26.0–34.0)
MCHC: 27.4 g/dL — AB (ref 30.0–36.0)
MCV: 72.3 fL — ABNORMAL LOW (ref 78.0–100.0)
Monocytes Absolute: 0.6 10*3/uL (ref 0.1–1.0)
Monocytes Relative: 8 % (ref 3–12)
Neutro Abs: 5 10*3/uL (ref 1.7–7.7)
Neutrophils Relative %: 65 % (ref 43–77)
PLATELETS: 332 10*3/uL (ref 150–400)
RBC: 5.55 MIL/uL (ref 4.22–5.81)
RDW: 25.9 % — ABNORMAL HIGH (ref 11.5–15.5)
WBC: 7.7 10*3/uL (ref 4.0–10.5)

## 2014-05-18 NOTE — Telephone Encounter (Signed)
Dean Bowers was informed and also faxed over the disability form to [phone]

## 2014-05-18 NOTE — Telephone Encounter (Signed)
Constance Holster called and said that she has an appointment for Dean Bowers on 05/31/14 with Dr.Harding and said she really couldn't make that appointment and the next appointment is June 22, she wanted to know if you thought that was to long

## 2014-05-18 NOTE — Telephone Encounter (Signed)
Since he is doing fine I don't have a problem waiting until June

## 2014-05-19 NOTE — Telephone Encounter (Signed)
Close encounter 

## 2014-05-21 ENCOUNTER — Telehealth: Payer: Self-pay | Admitting: Cardiology

## 2014-05-24 NOTE — Telephone Encounter (Signed)
Close encounter 

## 2014-05-25 ENCOUNTER — Encounter: Payer: PRIVATE HEALTH INSURANCE | Admitting: Family Medicine

## 2014-05-27 ENCOUNTER — Other Ambulatory Visit: Payer: Self-pay | Admitting: Internal Medicine

## 2014-05-27 ENCOUNTER — Other Ambulatory Visit: Payer: Self-pay | Admitting: Family Medicine

## 2014-05-28 ENCOUNTER — Telehealth: Payer: Self-pay | Admitting: Family Medicine

## 2014-05-28 MED ORDER — TORSEMIDE 20 MG PO TABS: 20.0000 mg | ORAL_TABLET | Freq: Two times a day (BID) | ORAL | Status: DC

## 2014-05-28 NOTE — Telephone Encounter (Signed)
Needs refill torsemide 20 mg   [facility]  [address]

## 2014-05-31 ENCOUNTER — Other Ambulatory Visit: Payer: PRIVATE HEALTH INSURANCE

## 2014-05-31 ENCOUNTER — Ambulatory Visit: Payer: 59 | Admitting: Cardiology

## 2014-06-01 ENCOUNTER — Encounter: Payer: PRIVATE HEALTH INSURANCE | Admitting: Family Medicine

## 2014-06-01 ENCOUNTER — Other Ambulatory Visit: Payer: Self-pay | Admitting: Family Medicine

## 2014-06-03 ENCOUNTER — Telehealth: Payer: Self-pay | Admitting: Internal Medicine

## 2014-06-03 MED ORDER — TORSEMIDE 20 MG PO TABS: 20.0000 mg | ORAL_TABLET | Freq: Two times a day (BID) | ORAL | Status: DC

## 2014-06-03 NOTE — Telephone Encounter (Signed)
Resent med in to pharmacy

## 2014-06-04 ENCOUNTER — Other Ambulatory Visit: Payer: Self-pay | Admitting: Internal Medicine

## 2014-06-06 ENCOUNTER — Other Ambulatory Visit: Payer: Self-pay | Admitting: Internal Medicine

## 2014-06-08 ENCOUNTER — Ambulatory Visit: Payer: 59 | Admitting: Medical

## 2014-06-09 ENCOUNTER — Other Ambulatory Visit: Payer: Self-pay | Admitting: Family Medicine

## 2014-06-14 ENCOUNTER — Other Ambulatory Visit: Payer: Self-pay | Admitting: Internal Medicine

## 2014-06-14 ENCOUNTER — Telehealth: Payer: Self-pay | Admitting: Internal Medicine

## 2014-06-14 DIAGNOSIS — M549 Dorsalgia, unspecified: Principal | ICD-10-CM

## 2014-06-14 DIAGNOSIS — G8929 Other chronic pain: Secondary | ICD-10-CM

## 2014-06-14 MED ORDER — HYDROCODONE-ACETAMINOPHEN 7.5-325 MG PO TABS: 1.0000 | ORAL_TABLET | Freq: Four times a day (QID) | ORAL | Status: DC | PRN

## 2014-06-14 MED ORDER — POTASSIUM CHLORIDE ER 20 MEQ PO TBCR: 20.0000 meq | EXTENDED_RELEASE_TABLET | Freq: Every day | ORAL | Status: DC

## 2014-06-14 MED ORDER — POTASSIUM CHLORIDE ER 20 MEQ PO TBCR
20.0000 meq | EXTENDED_RELEASE_TABLET | Freq: Every day | ORAL | Status: DC
Start: 2014-06-14 — End: 2014-06-14

## 2014-06-14 NOTE — Telephone Encounter (Signed)
Pt needs a refill on potassium 20mg  to [facility] [address] and refill on hyrdocodone 7.5-325. Call when ready to pick up rx for pain med

## 2014-06-16 ENCOUNTER — Ambulatory Visit (INDEPENDENT_AMBULATORY_CARE_PROVIDER_SITE_OTHER): Payer: 59 | Admitting: Pulmonary Disease

## 2014-06-16 ENCOUNTER — Encounter: Payer: Self-pay | Admitting: Pulmonary Disease

## 2014-06-16 VITALS — BP 122/76 | HR 88 | Ht 69.0 in | Wt 353.8 lb

## 2014-06-16 DIAGNOSIS — E662 Morbid (severe) obesity with alveolar hypoventilation: Secondary | ICD-10-CM | POA: Diagnosis not present

## 2014-06-16 DIAGNOSIS — G4733 Obstructive sleep apnea (adult) (pediatric): Secondary | ICD-10-CM

## 2014-06-16 NOTE — Patient Instructions (Signed)
Will get report from [place] on sleep apnea machine and call with changes Follow up in 4 months

## 2014-06-16 NOTE — Progress Notes (Signed)
Chief Complaint  Patient presents with  . Follow-up    Reports improvement in breathing since last OV. Pt has not used O2 at night for several weeks. Using Trilogy Vent at night, states that he fights it during the night d/t pressure being too low.     History of Present Illness: Dean Bowers is a 44 y.o. male smoker with OSA, OHS.  He is using Trilogy vent at night.  He has not been using oxygen during the day, but uses it at night.  He does not feel like the pressure setting is enough.  He is sleeping better with therapy.    He still smokes about 1/2 pack per day.  He gets occasional cough.  He has not been using inhaler much.  He didn't feel like breo helped.  Albuterol helps when he uses it.  TESTS: PFT 06/16/13 >> FEV1 2.13 (53%), FEV1% 81, TLC 4.92 (72%), DLCO 95%, no BD PSG 04/23/14 >> AHI 139.7, SpO2 low 70% CT chest 04/28/14 >> small PE LLL subsegment, Rt pleural effusion, 3 mm nodule RML, 4 mm nodule LLL, Sub carinal LAN  Past medical hx, Past surgical hx, Medications, Allergies, Family hx, Social hx all reviewed.   Physical Exam: Blood pressure 122/76, pulse 88, height 5\' 9"  (1.753 m), weight 353 lb 12.8 oz (160.483 kg), SpO2 95 %. Body mass index is 52.22 kg/(m^2).  General - No distress, in wheelchair ENT - No sinus tenderness, no oral exudate, no LAN, 4+ tonsils Cardiac - s1s2 regular, no murmur Chest - No wheeze/rales/dullness Back - No focal tenderness Abd - Soft, non-tender Ext - No edema, Rt leg BKA Neuro - Normal strength Skin - No rashes Psych - normal mood, and behavior   Assessment/Plan:  Obstructive sleep apnea. Plan: - will get copy of his home vent settings and then determine what adjustments are needed - he might need ENT evaluation if he is not able to be adequately titrated with PAP therapy >> very large tonsils  Obesity hypoventilation syndrome. Plan: - continue oxygen at night - discussed options to assist with weight loss  Tobacco  abuse. Plan: - reviewed options to help with smoking cessation  Pulmonary nodules and subcarinal adenopathy. Plan: - will need to re-assess when to f/u CT chest at next visit   Chesley Mires, MD [place] Pager:  [phone]

## 2014-06-21 ENCOUNTER — Telehealth: Payer: Self-pay

## 2014-06-21 NOTE — Telephone Encounter (Signed)
Dean Bowers's wife called to ask if it would safe for Dean Landry to resume taking the [facility] 7.5-46 that was prescribed 01-26-14.  She states that he has had issues with sepsis, heart failure, diabetes, fluid retention and hospitalized several times since the med was prescribed.  Please advise.

## 2014-06-22 NOTE — Telephone Encounter (Signed)
Prefer that he not use that drug at this time. Have him work with dietary modification, specifically cutting back on carbohydrates.

## 2014-06-22 NOTE — Telephone Encounter (Signed)
Called pt's wife stating for her husband not to start the med, but to work on dietary modification.

## 2014-06-25 ENCOUNTER — Other Ambulatory Visit: Payer: Self-pay | Admitting: Family Medicine

## 2014-07-01 LAB — HM DIABETES EYE EXAM

## 2014-07-08 ENCOUNTER — Telehealth: Payer: Self-pay | Admitting: Family Medicine

## 2014-07-08 NOTE — Telephone Encounter (Signed)
This should be once a day

## 2014-07-08 NOTE — Telephone Encounter (Signed)
[facility] requesting clarifications on directions for Breo since directions state to use med twice a day and recommended dosage is to use once a day.

## 2014-07-09 ENCOUNTER — Other Ambulatory Visit: Payer: Self-pay

## 2014-07-09 MED ORDER — FLUTICASONE FUROATE-VILANTEROL 100-25 MCG/INH IN AEPB: 1.0000 | INHALATION_SPRAY | Freq: Every day | RESPIRATORY_TRACT | Status: DC

## 2014-07-09 NOTE — Telephone Encounter (Signed)
I have fixed the breo Rx to one puff a day

## 2014-07-13 ENCOUNTER — Encounter: Payer: Self-pay | Admitting: Family Medicine

## 2014-07-21 ENCOUNTER — Telehealth: Payer: Self-pay | Admitting: Family Medicine

## 2014-07-21 ENCOUNTER — Other Ambulatory Visit: Payer: Self-pay | Admitting: Family Medicine

## 2014-07-21 ENCOUNTER — Telehealth: Payer: Self-pay | Admitting: Pulmonary Disease

## 2014-07-21 DIAGNOSIS — G4733 Obstructive sleep apnea (adult) (pediatric): Secondary | ICD-10-CM

## 2014-07-21 DIAGNOSIS — G8929 Other chronic pain: Secondary | ICD-10-CM

## 2014-07-21 DIAGNOSIS — M549 Dorsalgia, unspecified: Principal | ICD-10-CM

## 2014-07-21 NOTE — Telephone Encounter (Signed)
Spoke with pt's wife. He is currently using a noninvasive vent (Triology). The setting is changing during the night and he feels like he is suffocating.  Called Arbie Cookey at [facility]. She is going to fax over his vent settings so that VS can look at them. Once report is received it will be given to VS. Will leave message in triage.

## 2014-07-21 NOTE — Telephone Encounter (Signed)
Dean Bowers called for refills of pain medication. Please call [phone] when ready.

## 2014-07-22 ENCOUNTER — Telehealth: Payer: Self-pay | Admitting: Family Medicine

## 2014-07-22 MED ORDER — HYDROCODONE-ACETAMINOPHEN 7.5-325 MG PO TABS: 1.0000 | ORAL_TABLET | Freq: Four times a day (QID) | ORAL | Status: DC | PRN

## 2014-07-22 NOTE — Telephone Encounter (Signed)
Spoke with pt's wife, she is aware of results/recs.   Pt uses [facility].   Order placed.  Nothing further needed.

## 2014-07-22 NOTE — Telephone Encounter (Signed)
Trilogy AVAPS settings >> Vt 630, PS 6 to 30 cm H2O, max IPAP 35 cm H2O, EPAP 4 to 20 cm H2O.  Used with 2 liters oxygen.  Please send order to have his EPAP setting changed to 10 to 20 cm H2O.  Have them change pressure support to 6 to 25 cm H2O.  He should call back if he is still having difficulty.

## 2014-07-22 NOTE — Telephone Encounter (Signed)
Fax was received. Will place in VS look at.  VS - please advise. Thanks.

## 2014-07-22 NOTE — Telephone Encounter (Signed)
Advised wife that Rx ready to pick up for pain medication

## 2014-07-25 ENCOUNTER — Other Ambulatory Visit: Payer: Self-pay | Admitting: Family Medicine

## 2014-07-26 ENCOUNTER — Other Ambulatory Visit: Payer: Self-pay

## 2014-07-26 NOTE — Telephone Encounter (Signed)
Is this okay to refill? 

## 2014-07-27 ENCOUNTER — Other Ambulatory Visit: Payer: Self-pay

## 2014-08-02 ENCOUNTER — Other Ambulatory Visit: Payer: Self-pay

## 2014-08-03 ENCOUNTER — Encounter: Payer: Self-pay | Admitting: Cardiology

## 2014-08-03 ENCOUNTER — Ambulatory Visit (INDEPENDENT_AMBULATORY_CARE_PROVIDER_SITE_OTHER): Payer: 59 | Admitting: Cardiology

## 2014-08-03 VITALS — BP 110/78 | HR 97 | Ht 69.0 in | Wt 362.6 lb

## 2014-08-03 DIAGNOSIS — I10 Essential (primary) hypertension: Secondary | ICD-10-CM | POA: Diagnosis not present

## 2014-08-03 DIAGNOSIS — I50810 Right heart failure, unspecified: Secondary | ICD-10-CM

## 2014-08-03 DIAGNOSIS — E1169 Type 2 diabetes mellitus with other specified complication: Secondary | ICD-10-CM

## 2014-08-03 DIAGNOSIS — G4733 Obstructive sleep apnea (adult) (pediatric): Secondary | ICD-10-CM | POA: Diagnosis not present

## 2014-08-03 DIAGNOSIS — I509 Heart failure, unspecified: Secondary | ICD-10-CM

## 2014-08-03 DIAGNOSIS — I2699 Other pulmonary embolism without acute cor pulmonale: Secondary | ICD-10-CM

## 2014-08-03 DIAGNOSIS — E785 Hyperlipidemia, unspecified: Secondary | ICD-10-CM

## 2014-08-03 DIAGNOSIS — Z720 Tobacco use: Secondary | ICD-10-CM

## 2014-08-03 DIAGNOSIS — Z79899 Other long term (current) drug therapy: Secondary | ICD-10-CM

## 2014-08-03 DIAGNOSIS — I5032 Chronic diastolic (congestive) heart failure: Secondary | ICD-10-CM

## 2014-08-03 DIAGNOSIS — E876 Hypokalemia: Secondary | ICD-10-CM | POA: Diagnosis not present

## 2014-08-03 DIAGNOSIS — F17200 Nicotine dependence, unspecified, uncomplicated: Secondary | ICD-10-CM

## 2014-08-03 DIAGNOSIS — E66813 Obesity, class 3: Secondary | ICD-10-CM

## 2014-08-03 LAB — BASIC METABOLIC PANEL
BUN: 17 mg/dL (ref 6–23)
CHLORIDE: 98 meq/L (ref 96–112)
CO2: 30 mEq/L (ref 19–32)
CREATININE: 0.99 mg/dL (ref 0.50–1.35)
Calcium: 9.3 mg/dL (ref 8.4–10.5)
Glucose, Bld: 88 mg/dL (ref 70–99)
Potassium: 3.6 mEq/L (ref 3.5–5.3)
SODIUM: 139 meq/L (ref 135–145)

## 2014-08-03 NOTE — Patient Instructions (Addendum)
CONTINUE WITH SLIDING SCALE USE OF TORSEMIDE  -KEEP UP WITH DRY WEIGHT.  PLEASE DO LAB-- BMP  NOW  PLEASE DO LABS IN 3 MONTH -CMP,LIPID -- WILL SEND YOU LAB SLIP CLOSER TIME.   YOU WILL BE ON XARELTO FOR AT LEAST ONE FULL YEAR.  Your physician wants you to follow-up in [place]. - 30 MIN APPOINMTENT.  You will receive a reminder letter in the mail two months in advance. If you don't receive a letter, please call our office to schedule the follow-up appointment.
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
## 2014-08-04 ENCOUNTER — Telehealth: Payer: Self-pay | Admitting: *Deleted

## 2014-08-04 NOTE — Telephone Encounter (Signed)
Spoke to wife. Results given, verbalized understanding.

## 2014-08-04 NOTE — Telephone Encounter (Signed)
-----   Message from Leonie Man, MD sent at 08/04/2014  1:39 PM EDT ----- Chem panel looks pretty good.  [place]

## 2014-08-05 ENCOUNTER — Encounter: Payer: Self-pay | Admitting: Cardiology

## 2014-08-05 NOTE — Assessment & Plan Note (Signed)
He is currently not on a statin. I have not seen labs checked. I like her to recover from his hospital stay and we will check a CMP and lipid panel in a few months.

## 2014-08-05 NOTE — Assessment & Plan Note (Signed)
Although the more difficult now he is status post education, I did repeat her rate the importance of exercise along with Dr. Modification. He needs to lose weight in order to improve right-sided failure OSA/OHS.

## 2014-08-05 NOTE — Assessment & Plan Note (Signed)
From left upper shoulder many venous DVT. Based on his existing right heart failure, he would not tolerate a recurrent PE. With likely elevated pulmonary pressures, he is a setup for pulmonary Y. For that reason, I would be more inclined to continue treating him with Xarelto for an entire year as opposed to only 6 months.  He will need a CBC evaluation next visit to make sure he is no bleeding issues since he has a history of anemia requiring iron supplementation.

## 2014-08-05 NOTE — Assessment & Plan Note (Signed)
Blood pressures pre-well controlled now on diltiazem. If his blood pressures increase, would consider ARB fracture reduction.

## 2014-08-05 NOTE — Assessment & Plan Note (Signed)
Followed by pulmonary medicine (Dr. Halford Chessman). He is using a Trilogy - BiPAPmachine, tolerating well. I re-irradiate towards abusing this. I think some of his heart failure symptoms artery 2 OSA and obesity ventilation syndrome with right heart failure. He became very lethargic, hypoxic and hypercapnic without BiPAP.

## 2014-08-05 NOTE — Assessment & Plan Note (Signed)
Within being on a standing dose of diuretic, we will check a basic chemistry panel is current and return to baseline as an outpatient. This can be compared to follow up labs when he gets his lipids checked.

## 2014-08-05 NOTE — Assessment & Plan Note (Signed)
With poor quality echocardiogram. I really don't know how much of his symptoms are truly right-sided versus diastolic heart failure.  He did have some PND and orthopnea symptoms was suggested being some component of diastolic dysfunction. He is not on a beta blocker but is on a calcium channel blocker which would likely help elevated pulmonary pressures. He has relatively well-controlled blood pressure and therefore would not have a lot of room to add afterload reduction. Could consider sperm lactone in addition to his torsemide.  Reviewed weighted sliding-scale torsemide dosing. He seems to be at a stable weight at home. Weights are all if taken with his prosthetic leg in place.

## 2014-08-05 NOTE — Assessment & Plan Note (Signed)
Edema seems to be notably improved. He had significant 90+ pounds weight loss with diuresis. Now will maintain dry weight with sliding scale torsemide.  Would like to check a baseline of metabolic panel to reassess renal function and creatinine as well as electrolytes .

## 2014-08-10 ENCOUNTER — Other Ambulatory Visit: Payer: Self-pay | Admitting: Internal Medicine

## 2014-08-10 ENCOUNTER — Telehealth: Payer: Self-pay | Admitting: Family Medicine

## 2014-08-10 NOTE — Telephone Encounter (Signed)
Patient needs refill on levothyroxine 150 mcg --meds given by hospital , patient states you are aware he is on it   [employer]

## 2014-08-11 ENCOUNTER — Other Ambulatory Visit: Payer: Self-pay

## 2014-08-11 DIAGNOSIS — R7989 Other specified abnormal findings of blood chemistry: Secondary | ICD-10-CM

## 2014-08-11 NOTE — Telephone Encounter (Signed)
Let him know that he needs to come in for blood work to see if he is on the correct dose

## 2014-08-12 ENCOUNTER — Ambulatory Visit (INDEPENDENT_AMBULATORY_CARE_PROVIDER_SITE_OTHER): Payer: PRIVATE HEALTH INSURANCE | Admitting: Family Medicine

## 2014-08-12 ENCOUNTER — Other Ambulatory Visit: Payer: Self-pay

## 2014-08-12 ENCOUNTER — Encounter: Payer: Self-pay | Admitting: Family Medicine

## 2014-08-12 VITALS — BP 116/78 | HR 110 | Wt 361.2 lb

## 2014-08-12 DIAGNOSIS — R7989 Other specified abnormal findings of blood chemistry: Secondary | ICD-10-CM | POA: Diagnosis not present

## 2014-08-12 DIAGNOSIS — E291 Testicular hypofunction: Secondary | ICD-10-CM | POA: Diagnosis not present

## 2014-08-12 DIAGNOSIS — G4733 Obstructive sleep apnea (adult) (pediatric): Secondary | ICD-10-CM | POA: Diagnosis not present

## 2014-08-12 DIAGNOSIS — L03116 Cellulitis of left lower limb: Secondary | ICD-10-CM | POA: Diagnosis not present

## 2014-08-12 DIAGNOSIS — R946 Abnormal results of thyroid function studies: Secondary | ICD-10-CM

## 2014-08-12 DIAGNOSIS — N528 Other male erectile dysfunction: Secondary | ICD-10-CM | POA: Diagnosis not present

## 2014-08-12 LAB — TSH: TSH: 0.653 u[IU]/mL (ref 0.350–4.500)

## 2014-08-12 MED ORDER — DOXYCYCLINE HYCLATE 100 MG PO TABS: 100.0000 mg | ORAL_TABLET | Freq: Two times a day (BID) | ORAL | Status: DC

## 2014-08-12 MED ORDER — TORSEMIDE 20 MG PO TABS: ORAL_TABLET | ORAL | Status: DC

## 2014-08-12 MED ORDER — TADALAFIL 20 MG PO TABS: 20.0000 mg | ORAL_TABLET | Freq: Every day | ORAL | Status: DC | PRN

## 2014-08-12 NOTE — Progress Notes (Signed)
   Subjective:    Patient ID: Dean Bowers, male    DOB: October 15, 1970, 44 y.o.   MRN: [id number]  HPI He is here for consult concerning several issues. While in the hospital he did have a slightly elevated TSH in the 7 range and was placed on thyroid replacement. He also had a testosterone done while at the urologist office and apparently this was low. He also notes redness and swelling and clear drainage from the lateral left calf. It is not causing much of a problem for him other than it is now starting to drain. He continues to use his machine for OSA. He has a special 1 due to his large size and difficulty with BiPAP. He also has had some erectile dysfunction. He has tried Cialis in the past and would like to have more.   Review of Systems     Objective:   Physical Exam Alert and in no distress. Exam of the left lateral calf does show slight swelling and erythema with clear drainage from a vesicle. It is slightly warm to touch.       Assessment & Plan:  Low testosterone - Plan: Testosterone  Abnormal TSH - Plan: TSH, Thyroid Peroxidase Antibody, CANCELED: TSH  Cellulitis of leg, left - Plan: doxycycline (VIBRA-TABS) 100 MG tablet, Wound culture, CANCELED: Wound culture  Obesity, Class III, BMI 40-49.9 (morbid obesity)  OSA (obstructive sleep apnea)  Elevated TSH  Other male erectile dysfunction - Plan: tadalafil (CIALIS) 20 MG tablet He is to call me in 2 weeks to let me know how his leg is doing.I will also check a TPO together better feel for his thyroid condition. I also discussed the fact that weight loss would help with all of his conditions including sleep apnea, testosterone and risk for diabetes.

## 2014-08-12 NOTE — Patient Instructions (Signed)
Call me in 2 weeks and let me know about your leg

## 2014-08-13 LAB — TESTOSTERONE: Testosterone: 269 ng/dL — ABNORMAL LOW (ref 300–890)

## 2014-08-13 LAB — THYROID PEROXIDASE ANTIBODY: THYROID PEROXIDASE ANTIBODY: 2 [IU]/mL (ref <9)

## 2014-08-13 MED ORDER — LEVOTHYROXINE SODIUM 150 MCG PO TABS: 150.0000 ug | ORAL_TABLET | Freq: Every day | ORAL | Status: DC

## 2014-08-13 NOTE — Addendum Note (Signed)
Addended by: Denita Lung on: 08/13/2014 08:11 AM   Modules accepted: Orders

## 2014-08-15 ENCOUNTER — Other Ambulatory Visit: Payer: Self-pay | Admitting: Family Medicine

## 2014-08-15 LAB — WOUND CULTURE
GRAM STAIN: NONE SEEN
Gram Stain: NONE SEEN
Gram Stain: NONE SEEN

## 2014-08-16 NOTE — Telephone Encounter (Signed)
Is this okay?

## 2014-08-17 ENCOUNTER — Other Ambulatory Visit: Payer: Self-pay | Admitting: Family Medicine

## 2014-08-17 ENCOUNTER — Ambulatory Visit (INDEPENDENT_AMBULATORY_CARE_PROVIDER_SITE_OTHER): Payer: PRIVATE HEALTH INSURANCE | Admitting: Family Medicine

## 2014-08-17 DIAGNOSIS — L03116 Cellulitis of left lower limb: Secondary | ICD-10-CM

## 2014-08-17 DIAGNOSIS — N528 Other male erectile dysfunction: Secondary | ICD-10-CM

## 2014-08-17 DIAGNOSIS — E291 Testicular hypofunction: Secondary | ICD-10-CM | POA: Diagnosis not present

## 2014-08-17 DIAGNOSIS — R7989 Other specified abnormal findings of blood chemistry: Secondary | ICD-10-CM | POA: Diagnosis not present

## 2014-08-17 MED ORDER — TESTOSTERONE 20.25 MG/ACT (1.62%) TD GEL: 2.0000 "application " | Freq: Every day | TRANSDERMAL | Status: DC

## 2014-08-17 MED ORDER — DOXYCYCLINE HYCLATE 100 MG PO TABS: 100.0000 mg | ORAL_TABLET | Freq: Two times a day (BID) | ORAL | Status: DC

## 2014-08-17 MED ORDER — SILDENAFIL CITRATE 100 MG PO TABS: 100.0000 mg | ORAL_TABLET | Freq: Every day | ORAL | Status: DC | PRN

## 2014-08-17 NOTE — Progress Notes (Signed)
   Subjective:    Patient ID: Dean Bowers, male    DOB: 1970/02/07, 44 y.o.   MRN: [id number]  HPI He is here for consult concerning multiple issues. He has had 2 separate testosterone levels that are both low. He does admit to decreased libido, energy and stamina. He also states that Cialis was going to cost too much and he would like to be switched to a different ED medication. On a recent hospitalization his TSH was elevated and he was placed on thyroid replacement. I did order a TPO which came.quite low. Use also here to have his left calf evaluated. He had been on antibody can states he is doing much better.   Review of Systems     Objective:   Physical Exam Alert and in no distress. Exam of the left lateral calf does show less erythema and warmth.       Assessment & Plan:  Hypogonadism in male - Plan: Testosterone (ANDROGEL PUMP) 20.25 MG/ACT (1.62%) GEL  Abnormal TSH  Other male erectile dysfunction - Plan: sildenafil (VIAGRA) 100 MG tablet  Cellulitis of leg, left - Plan: doxycycline (VIBRA-TABS) 100 MG tablet I discussed treatment of testosterone in detail with him. Explained the use the medication and where to apply. Also discussed possible side effects of the medication including bone marrow issues as well as prostate and risk for DVT. We also then discussed his abnormal thyroid. I explained that I thought would be prudent to have him stop the thyroid medication. Recheck this in about 2 months when we recheck his testosterone. I will also give him another 2 weeks of doxycycline.A new prescription for Viagra was written. Over 25 minutes,Greater than 50%Spent in counseling and coordination of care.

## 2014-08-17 NOTE — Telephone Encounter (Signed)
Is this okay?

## 2014-08-18 ENCOUNTER — Encounter: Payer: Self-pay | Admitting: Family Medicine

## 2014-08-18 ENCOUNTER — Other Ambulatory Visit: Payer: Self-pay | Admitting: Family Medicine

## 2014-08-18 MED ORDER — SILDENAFIL CITRATE 20 MG PO TABS
20.0000 mg | ORAL_TABLET | Freq: Three times a day (TID) | ORAL | Status: DC
Start: 2014-08-18 — End: 2014-09-27

## 2014-08-20 ENCOUNTER — Telehealth: Payer: Self-pay | Admitting: Internal Medicine

## 2014-08-20 DIAGNOSIS — E291 Testicular hypofunction: Secondary | ICD-10-CM

## 2014-08-20 NOTE — Telephone Encounter (Signed)
To do a Prior Auth, a PSA level needs to be done. Can you order a future PSA so i can call pt and let him to come in. I doubt they will approve his androgel without PSA level done

## 2014-08-20 NOTE — Telephone Encounter (Signed)
Prior auth for androgel. Faxed in form to [place] @ [phone]. Also fax over 2 testosterone levels, (one from [place] 269, one from [facility] 174). No PSA level has been tested before.

## 2014-08-24 ENCOUNTER — Telehealth: Payer: Self-pay | Admitting: Family Medicine

## 2014-08-24 NOTE — Telephone Encounter (Signed)
Per wife already picked up Rx.  I called pharmacy & Androgel went thru for $0 co pay on 08/21/14

## 2014-08-26 ENCOUNTER — Encounter: Payer: Self-pay | Admitting: Family Medicine

## 2014-09-06 ENCOUNTER — Other Ambulatory Visit: Payer: Self-pay | Admitting: Family Medicine

## 2014-09-06 NOTE — Telephone Encounter (Signed)
Is this okay to refill? 

## 2014-09-27 ENCOUNTER — Other Ambulatory Visit: Payer: Self-pay | Admitting: Family Medicine

## 2014-09-27 ENCOUNTER — Telehealth: Payer: Self-pay | Admitting: Family Medicine

## 2014-09-27 DIAGNOSIS — G8929 Other chronic pain: Secondary | ICD-10-CM

## 2014-09-27 DIAGNOSIS — M549 Dorsalgia, unspecified: Principal | ICD-10-CM

## 2014-09-27 MED ORDER — HYDROCODONE-ACETAMINOPHEN 7.5-325 MG PO TABS: 1.0000 | ORAL_TABLET | Freq: Four times a day (QID) | ORAL | Status: DC | PRN

## 2014-09-27 NOTE — Telephone Encounter (Signed)
Called pt's wife to let her know that pt's Hydrocodone #30 script ready for pick up

## 2014-09-27 NOTE — Telephone Encounter (Signed)
Dr.Lalonde are these okay to refill

## 2014-09-27 NOTE — Telephone Encounter (Signed)
Have him come in for visits we can discuss all of these

## 2014-09-27 NOTE — Telephone Encounter (Signed)
Requesting refill on Hydrocodone 7.5-3.25mg . Call [phone] when script ready

## 2014-09-29 ENCOUNTER — Telehealth: Payer: Self-pay

## 2014-09-29 NOTE — Telephone Encounter (Signed)
Received refill request for Diclofenac (voltaren) 75mg  EC tabs #30

## 2014-09-30 MED ORDER — DICLOFENAC SODIUM 75 MG PO TBEC: DELAYED_RELEASE_TABLET | ORAL | Status: DC

## 2014-10-17 ENCOUNTER — Other Ambulatory Visit: Payer: Self-pay | Admitting: Family Medicine

## 2014-10-18 NOTE — Telephone Encounter (Signed)
Is this okay?

## 2014-10-19 ENCOUNTER — Other Ambulatory Visit: Payer: Self-pay | Admitting: Family Medicine

## 2014-10-19 ENCOUNTER — Encounter: Payer: Self-pay | Admitting: Family Medicine

## 2014-10-19 ENCOUNTER — Ambulatory Visit (INDEPENDENT_AMBULATORY_CARE_PROVIDER_SITE_OTHER): Payer: PRIVATE HEALTH INSURANCE | Admitting: Family Medicine

## 2014-10-19 ENCOUNTER — Ambulatory Visit: Payer: PRIVATE HEALTH INSURANCE | Admitting: Family Medicine

## 2014-10-19 ENCOUNTER — Other Ambulatory Visit: Payer: Self-pay

## 2014-10-19 VITALS — BP 130/80 | HR 103 | Ht 69.0 in | Wt 367.0 lb

## 2014-10-19 DIAGNOSIS — I10 Essential (primary) hypertension: Secondary | ICD-10-CM | POA: Diagnosis not present

## 2014-10-19 DIAGNOSIS — R7989 Other specified abnormal findings of blood chemistry: Secondary | ICD-10-CM | POA: Diagnosis not present

## 2014-10-19 DIAGNOSIS — G8929 Other chronic pain: Secondary | ICD-10-CM | POA: Diagnosis not present

## 2014-10-19 DIAGNOSIS — I5032 Chronic diastolic (congestive) heart failure: Secondary | ICD-10-CM | POA: Diagnosis not present

## 2014-10-19 DIAGNOSIS — R7302 Impaired glucose tolerance (oral): Secondary | ICD-10-CM

## 2014-10-19 DIAGNOSIS — N528 Other male erectile dysfunction: Secondary | ICD-10-CM | POA: Diagnosis not present

## 2014-10-19 DIAGNOSIS — Z23 Encounter for immunization: Secondary | ICD-10-CM | POA: Diagnosis not present

## 2014-10-19 DIAGNOSIS — M549 Dorsalgia, unspecified: Secondary | ICD-10-CM

## 2014-10-19 DIAGNOSIS — G4733 Obstructive sleep apnea (adult) (pediatric): Secondary | ICD-10-CM

## 2014-10-19 DIAGNOSIS — E291 Testicular hypofunction: Secondary | ICD-10-CM | POA: Diagnosis not present

## 2014-10-19 DIAGNOSIS — E66813 Obesity, class 3: Secondary | ICD-10-CM

## 2014-10-19 LAB — TSH: TSH: 1.589 u[IU]/mL (ref 0.350–4.500)

## 2014-10-19 MED ORDER — DICLOFENAC SODIUM 75 MG PO TBEC: DELAYED_RELEASE_TABLET | ORAL | Status: DC

## 2014-10-19 MED ORDER — SILDENAFIL CITRATE 20 MG PO TABS: ORAL_TABLET | ORAL | Status: DC

## 2014-10-19 MED ORDER — ALPRAZOLAM 0.5 MG PO TABS: 0.5000 mg | ORAL_TABLET | Freq: Three times a day (TID) | ORAL | Status: DC | PRN

## 2014-10-19 MED ORDER — TORSEMIDE 20 MG PO TABS: ORAL_TABLET | ORAL | Status: DC
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
## 2014-10-20 ENCOUNTER — Other Ambulatory Visit: Payer: Self-pay

## 2014-10-20 DIAGNOSIS — E291 Testicular hypofunction: Secondary | ICD-10-CM

## 2014-10-20 DIAGNOSIS — R7989 Other specified abnormal findings of blood chemistry: Secondary | ICD-10-CM

## 2014-10-20 LAB — PSA: PSA: 0.31 ng/mL (ref <=4.00)

## 2014-10-20 LAB — TESTOSTERONE: TESTOSTERONE: 266 ng/dL — AB (ref 300–890)

## 2014-10-20 MED ORDER — TESTOSTERONE 20.25 MG/ACT (1.62%) TD GEL: 4.0000 "application " | Freq: Every day | TRANSDERMAL | Status: AC

## 2014-10-22 ENCOUNTER — Encounter: Payer: Self-pay | Admitting: Internal Medicine

## 2014-10-22 ENCOUNTER — Telehealth: Payer: Self-pay | Admitting: *Deleted

## 2014-10-22 ENCOUNTER — Ambulatory Visit (INDEPENDENT_AMBULATORY_CARE_PROVIDER_SITE_OTHER): Payer: 59 | Admitting: Internal Medicine

## 2014-10-22 VITALS — BP 128/66 | HR 104 | Ht 69.0 in | Wt 392.0 lb

## 2014-10-22 DIAGNOSIS — R195 Other fecal abnormalities: Secondary | ICD-10-CM | POA: Diagnosis not present

## 2014-10-22 DIAGNOSIS — D509 Iron deficiency anemia, unspecified: Secondary | ICD-10-CM | POA: Diagnosis not present

## 2014-10-22 DIAGNOSIS — Z86718 Personal history of other venous thrombosis and embolism: Secondary | ICD-10-CM | POA: Diagnosis not present

## 2014-10-22 DIAGNOSIS — Z7901 Long term (current) use of anticoagulants: Secondary | ICD-10-CM

## 2014-10-22 MED ORDER — NA SULFATE-K SULFATE-MG SULF 17.5-3.13-1.6 GM/177ML PO SOLN: ORAL | Status: DC

## 2014-10-22 NOTE — Telephone Encounter (Signed)
He can come off for 2 days prior to the procedure

## 2014-10-22 NOTE — Telephone Encounter (Signed)
I have left a message with patient's wife for patient to call back.
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
## 2014-10-22 NOTE — Telephone Encounter (Signed)
  10/22/2014 RE: CLARON ROSENCRANS DOB: [birth year] MRN: [id number]  Dear Dr Redmond School,   We have scheduled the above patient for an endoscopic procedure. Our records show that he is on anticoagulation therapy.  Please advise as to whether the patient may come off his therapy of Xarelto 2 days prior to the procedure, which is scheduled for 12-06-2014. Please route your response to Dixon Boos.   Sincerely, Dixon Boos

## 2014-10-22 NOTE — Patient Instructions (Signed)
You have been scheduled for a colonoscopy. Please follow written instructions given to you at your visit today.  Please pick up your prep supplies at the pharmacy within the next 1-3 days. If you use inhalers (even only as needed), please bring them with you on the day of your procedure. Your physician has requested that you go to www.startemmi.com and enter the access code given to you at your visit today. This web site gives a general overview about your procedure. However, you should still follow specific instructions given to you by our office regarding your preparation for the procedure.   You will be contacted by our office prior to your procedure for directions on holding your Xarelto.  If you do not hear from our office 1 week prior to your scheduled procedure, please call [phone] to discuss.   

## 2014-10-25 NOTE — Telephone Encounter (Signed)
Patient's wife states she told him to call our office but gave me another number to get in touch with him. I have attempted to reach him at the home number but had to leave a message for him to call back.

## 2014-10-25 NOTE — Telephone Encounter (Signed)
I have spoken to patient and have advised that per Dr Redmond School, he may hold Xarelto 2 days prior to his scheduled procedure. Patient verbalizes understanding.

## 2014-10-25 NOTE — Telephone Encounter (Signed)
Pt said he is returning your call [phone]

## 2014-10-28 ENCOUNTER — Encounter: Payer: Self-pay | Admitting: Family Medicine

## 2014-10-28 ENCOUNTER — Other Ambulatory Visit: Payer: Self-pay | Admitting: Family Medicine

## 2014-10-28 NOTE — Telephone Encounter (Signed)
Called these in on the 13th

## 2014-10-28 NOTE — Telephone Encounter (Signed)
IS THIS OKAY 

## 2014-11-03 ENCOUNTER — Other Ambulatory Visit: Payer: Self-pay | Admitting: Family Medicine

## 2014-11-03 NOTE — Telephone Encounter (Signed)
Is this okay to refill? 

## 2014-11-08 ENCOUNTER — Telehealth: Payer: Self-pay | Admitting: Adult Health

## 2014-11-08 ENCOUNTER — Ambulatory Visit (INDEPENDENT_AMBULATORY_CARE_PROVIDER_SITE_OTHER): Payer: 59 | Admitting: Adult Health

## 2014-11-08 ENCOUNTER — Encounter: Payer: Self-pay | Admitting: Adult Health

## 2014-11-08 VITALS — BP 138/72 | HR 111 | Temp 97.9°F | Ht 70.0 in | Wt 396.2 lb

## 2014-11-08 DIAGNOSIS — G4733 Obstructive sleep apnea (adult) (pediatric): Secondary | ICD-10-CM | POA: Diagnosis not present

## 2014-11-08 DIAGNOSIS — R911 Solitary pulmonary nodule: Secondary | ICD-10-CM | POA: Diagnosis not present

## 2014-11-08 NOTE — Telephone Encounter (Signed)
Returned call and spoke with pt's wife Notified her that I spoke to [place] and that they need the pt to bring the SD card to the office in order to receive a download And that they had been trying to reach the pt with no response back. She voiced understanding and stated that she would contact them to discuss the options She had no further questions and nothing further needed
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
## 2014-11-08 NOTE — Assessment & Plan Note (Signed)
Lung nodule along with subcarinal adenopathy noted on CT in March 2016 with PE.  Pt is a smoker  , will need a CT chest to follow this area.

## 2014-11-08 NOTE — Patient Instructions (Addendum)
Download requested.  Work on weight loss.  Wear Trilogy At bedtime   Work on not smoking .  Follow up Dr. Halford Chessman in 4 months and As needed   CT chest to follow up lung nodule

## 2014-11-08 NOTE — Assessment & Plan Note (Signed)
Cont on [employer] requested. Will adjust if needed after look at download.   Plan  Download requested.  Work on weight loss.  Wear Trilogy At bedtime   Work on not smoking .  Follow up Dr. Halford Chessman in 4 months and As needed

## 2014-11-09 ENCOUNTER — Other Ambulatory Visit: Payer: Self-pay | Admitting: Family Medicine

## 2014-11-09 ENCOUNTER — Telehealth: Payer: Self-pay | Admitting: Adult Health

## 2014-11-09 DIAGNOSIS — R911 Solitary pulmonary nodule: Secondary | ICD-10-CM

## 2014-11-09 NOTE — Telephone Encounter (Signed)
Is this okay?

## 2014-11-09 NOTE — Telephone Encounter (Signed)
Received verbal order from TP to contact pt to schedule a  CT chest w/o contrast for Lung nodule  Called and spoke with pt's wife. Reviewed TP's recs. She stated that she schedules all of the pt's appointments and agreed to having order placed  I informed her that PCC's will contact them to schedule appt. She voiced understanding and had no further questions. Order for CT chest w/o contrast was placed  Nothing further needed

## 2014-11-09 NOTE — Progress Notes (Signed)
Reviewed and agree with assessment/plan. 

## 2014-11-10 ENCOUNTER — Telehealth: Payer: Self-pay | Admitting: Family Medicine

## 2014-11-10 ENCOUNTER — Other Ambulatory Visit: Payer: Self-pay | Admitting: Family Medicine

## 2014-11-10 DIAGNOSIS — G8929 Other chronic pain: Secondary | ICD-10-CM

## 2014-11-10 DIAGNOSIS — M549 Dorsalgia, unspecified: Principal | ICD-10-CM

## 2014-11-10 MED ORDER — HYDROCODONE-ACETAMINOPHEN 7.5-325 MG PO TABS: 1.0000 | ORAL_TABLET | Freq: Four times a day (QID) | ORAL | Status: DC | PRN

## 2014-11-10 NOTE — Telephone Encounter (Signed)
I did say note as I don't see why he is using this.

## 2014-11-10 NOTE — Telephone Encounter (Signed)
Pt wife called and was requesting a RX refill for the pts [place].

## 2014-11-10 NOTE — Telephone Encounter (Signed)
DR.LALONDE I THINK YOU SAID NO TO THIS YESTERDAY

## 2014-11-18 ENCOUNTER — Ambulatory Visit (INDEPENDENT_AMBULATORY_CARE_PROVIDER_SITE_OTHER)
Admission: RE | Admit: 2014-11-18 | Discharge: 2014-11-18 | Disposition: A | Discharge status: Home / Self Care | Payer: 59 | Source: Ambulatory Visit | Attending: Adult Health | Admitting: Adult Health

## 2014-11-18 DIAGNOSIS — R911 Solitary pulmonary nodule: Secondary | ICD-10-CM | POA: Diagnosis not present

## 2014-11-22 ENCOUNTER — Other Ambulatory Visit: Payer: PRIVATE HEALTH INSURANCE

## 2014-11-22 DIAGNOSIS — R7989 Other specified abnormal findings of blood chemistry: Secondary | ICD-10-CM

## 2014-11-23 ENCOUNTER — Other Ambulatory Visit: Payer: Self-pay | Admitting: Adult Health

## 2014-11-23 ENCOUNTER — Ambulatory Visit: Payer: 59 | Admitting: Dietician

## 2014-11-23 DIAGNOSIS — R911 Solitary pulmonary nodule: Secondary | ICD-10-CM

## 2014-11-23 LAB — TESTOSTERONE: TESTOSTERONE: 423 ng/dL (ref 300–890)

## 2014-11-23 NOTE — Progress Notes (Signed)
Quick Note:  LVM for pt to return call ______ 

## 2014-11-23 NOTE — Progress Notes (Signed)
Quick Note:  Pt's wife returned call. Reviewed results and recs. She voiced understanding and had no further questions. Scheduled pt for ov with VS on 03/22/14 @ 10:30 am. CT order was placed for 1 year and placed on reminder list. ______

## 2014-11-26 ENCOUNTER — Telehealth: Payer: Self-pay | Admitting: Family Medicine
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
## 2014-11-29 ENCOUNTER — Encounter (HOSPITAL_COMMUNITY): Payer: Self-pay | Admitting: *Deleted

## 2014-12-02 ENCOUNTER — Other Ambulatory Visit: Payer: Self-pay | Admitting: Family Medicine

## 2014-12-02 ENCOUNTER — Telehealth: Payer: Self-pay

## 2014-12-02 NOTE — Telephone Encounter (Signed)
Ok to refill 

## 2014-12-02 NOTE — Telephone Encounter (Signed)
Wife called requesting a knee brace for [place], they can be reached at [phone]

## 2014-12-02 NOTE — Telephone Encounter (Signed)
I need more information so the best would be to have them come in so we can discuss this further

## 2014-12-02 NOTE — Telephone Encounter (Signed)
Left detailed message stating that pt will need an appt to discuss this further

## 2014-12-06 ENCOUNTER — Ambulatory Visit (INDEPENDENT_AMBULATORY_CARE_PROVIDER_SITE_OTHER): Payer: PRIVATE HEALTH INSURANCE | Admitting: Family Medicine

## 2014-12-06 ENCOUNTER — Ambulatory Visit (HOSPITAL_COMMUNITY)
Admission: RE | Admit: 2014-12-06 | Discharge: 2014-12-06 | Disposition: A | Discharge status: Home / Self Care | Payer: 59 | Source: Ambulatory Visit | Attending: Internal Medicine | Admitting: Internal Medicine

## 2014-12-06 ENCOUNTER — Ambulatory Visit (HOSPITAL_COMMUNITY): Payer: 59 | Admitting: Anesthesiology

## 2014-12-06 ENCOUNTER — Encounter (HOSPITAL_COMMUNITY): Payer: Self-pay | Admitting: Internal Medicine

## 2014-12-06 ENCOUNTER — Encounter (HOSPITAL_COMMUNITY)
Admission: RE | Disposition: A | Discharge status: Home / Self Care | Payer: Self-pay | Source: Ambulatory Visit | Attending: Internal Medicine

## 2014-12-06 ENCOUNTER — Encounter: Payer: Self-pay | Admitting: Pulmonary Disease

## 2014-12-06 ENCOUNTER — Encounter: Payer: Self-pay | Admitting: Family Medicine

## 2014-12-06 VITALS — BP 150/98 | HR 124

## 2014-12-06 DIAGNOSIS — Z86711 Personal history of pulmonary embolism: Secondary | ICD-10-CM | POA: Insufficient documentation

## 2014-12-06 DIAGNOSIS — Z6841 Body Mass Index (BMI) 40.0 and over, adult: Secondary | ICD-10-CM | POA: Insufficient documentation

## 2014-12-06 DIAGNOSIS — D175 Benign lipomatous neoplasm of intra-abdominal organs: Secondary | ICD-10-CM | POA: Insufficient documentation

## 2014-12-06 DIAGNOSIS — J449 Chronic obstructive pulmonary disease, unspecified: Secondary | ICD-10-CM | POA: Diagnosis not present

## 2014-12-06 DIAGNOSIS — Z79899 Other long term (current) drug therapy: Secondary | ICD-10-CM | POA: Insufficient documentation

## 2014-12-06 DIAGNOSIS — K219 Gastro-esophageal reflux disease without esophagitis: Secondary | ICD-10-CM | POA: Insufficient documentation

## 2014-12-06 DIAGNOSIS — G8929 Other chronic pain: Secondary | ICD-10-CM | POA: Insufficient documentation

## 2014-12-06 DIAGNOSIS — K5730 Diverticulosis of large intestine without perforation or abscess without bleeding: Secondary | ICD-10-CM | POA: Diagnosis not present

## 2014-12-06 DIAGNOSIS — M5432 Sciatica, left side: Secondary | ICD-10-CM

## 2014-12-06 DIAGNOSIS — G4730 Sleep apnea, unspecified: Secondary | ICD-10-CM | POA: Diagnosis not present

## 2014-12-06 DIAGNOSIS — M549 Dorsalgia, unspecified: Secondary | ICD-10-CM | POA: Diagnosis not present

## 2014-12-06 DIAGNOSIS — Z5181 Encounter for therapeutic drug level monitoring: Secondary | ICD-10-CM | POA: Diagnosis not present

## 2014-12-06 DIAGNOSIS — R195 Other fecal abnormalities: Secondary | ICD-10-CM | POA: Diagnosis not present

## 2014-12-06 DIAGNOSIS — Z9981 Dependence on supplemental oxygen: Secondary | ICD-10-CM | POA: Diagnosis not present

## 2014-12-06 DIAGNOSIS — Z89511 Acquired absence of right leg below knee: Secondary | ICD-10-CM | POA: Insufficient documentation

## 2014-12-06 DIAGNOSIS — I10 Essential (primary) hypertension: Secondary | ICD-10-CM | POA: Diagnosis not present

## 2014-12-06 DIAGNOSIS — Z87891 Personal history of nicotine dependence: Secondary | ICD-10-CM | POA: Insufficient documentation

## 2014-12-06 DIAGNOSIS — J45909 Unspecified asthma, uncomplicated: Secondary | ICD-10-CM | POA: Insufficient documentation

## 2014-12-06 DIAGNOSIS — D509 Iron deficiency anemia, unspecified: Secondary | ICD-10-CM | POA: Insufficient documentation

## 2014-12-06 DIAGNOSIS — Z86718 Personal history of other venous thrombosis and embolism: Secondary | ICD-10-CM | POA: Diagnosis not present

## 2014-12-06 DIAGNOSIS — K635 Polyp of colon: Secondary | ICD-10-CM | POA: Diagnosis not present

## 2014-12-06 DIAGNOSIS — E785 Hyperlipidemia, unspecified: Secondary | ICD-10-CM | POA: Diagnosis not present

## 2014-12-06 DIAGNOSIS — Z7901 Long term (current) use of anticoagulants: Secondary | ICD-10-CM

## 2014-12-06 DIAGNOSIS — D125 Benign neoplasm of sigmoid colon: Secondary | ICD-10-CM | POA: Insufficient documentation

## 2014-12-06 DIAGNOSIS — M62838 Other muscle spasm: Secondary | ICD-10-CM | POA: Diagnosis not present

## 2014-12-06 DIAGNOSIS — E1151 Type 2 diabetes mellitus with diabetic peripheral angiopathy without gangrene: Secondary | ICD-10-CM | POA: Diagnosis not present

## 2014-12-06 HISTORY — DX: Sleep apnea, unspecified: G47.30

## 2014-12-06 HISTORY — DX: Other complications of anesthesia, initial encounter: T88.59XA

## 2014-12-06 HISTORY — DX: Adverse effect of unspecified anesthetic, initial encounter: T41.45XA

## 2014-12-06 HISTORY — PX: COLONOSCOPY WITH PROPOFOL: SHX5780

## 2014-12-06 LAB — CBC WITH DIFFERENTIAL/PLATELET
Basophils Absolute: 0 10*3/uL (ref 0.0–0.1)
Basophils Relative: 0 % (ref 0–1)
Eosinophils Absolute: 0.4 10*3/uL (ref 0.0–0.7)
Eosinophils Relative: 4 % (ref 0–5)
HEMATOCRIT: 44.2 % (ref 39.0–52.0)
HEMOGLOBIN: 14.4 g/dL (ref 13.0–17.0)
LYMPHS ABS: 1.9 10*3/uL (ref 0.7–4.0)
LYMPHS PCT: 20 % (ref 12–46)
MCH: 26.1 pg (ref 26.0–34.0)
MCHC: 32.6 g/dL (ref 30.0–36.0)
MCV: 80.2 fL (ref 78.0–100.0)
MONOS PCT: 6 % (ref 3–12)
MPV: 9.6 fL (ref 8.6–12.4)
Monocytes Absolute: 0.6 10*3/uL (ref 0.1–1.0)
NEUTROS ABS: 6.5 10*3/uL (ref 1.7–7.7)
NEUTROS PCT: 70 % (ref 43–77)
Platelets: 243 10*3/uL (ref 150–400)
RBC: 5.51 MIL/uL (ref 4.22–5.81)
RDW: 16.5 % — ABNORMAL HIGH (ref 11.5–15.5)
WBC: 9.3 10*3/uL (ref 4.0–10.5)

## 2014-12-06 LAB — FERRITIN: Ferritin: 49 ng/mL (ref 22–322)

## 2014-12-06 LAB — COMPREHENSIVE METABOLIC PANEL
ALT: 65 U/L — AB (ref 9–46)
AST: 93 U/L — AB (ref 10–40)
Albumin: 3.8 g/dL (ref 3.6–5.1)
Alkaline Phosphatase: 66 U/L (ref 40–115)
BILIRUBIN TOTAL: 0.6 mg/dL (ref 0.2–1.2)
BUN: 11 mg/dL (ref 7–25)
CALCIUM: 8.7 mg/dL (ref 8.6–10.3)
CO2: 34 mmol/L — AB (ref 20–31)
Chloride: 96 mmol/L — ABNORMAL LOW (ref 98–110)
Creat: 0.85 mg/dL (ref 0.60–1.35)
GLUCOSE: 143 mg/dL — AB (ref 65–99)
Potassium: 3.7 mmol/L (ref 3.5–5.3)
Sodium: 140 mmol/L (ref 135–146)
TOTAL PROTEIN: 7.1 g/dL (ref 6.1–8.1)

## 2014-12-06 LAB — IRON: Iron: 28 ug/dL — ABNORMAL LOW (ref 50–180)

## 2014-12-06 SURGERY — COLONOSCOPY WITH PROPOFOL
Anesthesia: Monitor Anesthesia Care

## 2014-12-06 MED ORDER — PROPOFOL 500 MG/50ML IV EMUL
INTRAVENOUS | Status: DC | PRN
  Administered 2014-12-06: 200 ug/kg/min via INTRAVENOUS

## 2014-12-06 MED ORDER — LACTATED RINGERS IV SOLN
INTRAVENOUS | Status: DC
  Administered 2014-12-06: 1000 mL via INTRAVENOUS

## 2014-12-06 MED ORDER — HYDROCODONE-ACETAMINOPHEN 7.5-325 MG PO TABS: 1.0000 | ORAL_TABLET | Freq: Four times a day (QID) | ORAL | Status: DC | PRN

## 2014-12-06 MED ORDER — CYCLOBENZAPRINE HCL 10 MG PO TABS: 5.0000 mg | ORAL_TABLET | Freq: Three times a day (TID) | ORAL | Status: DC | PRN

## 2014-12-06 MED ORDER — FENTANYL CITRATE (PF) 100 MCG/2ML IJ SOLN: 25.0000 ug | INTRAMUSCULAR | Status: DC | PRN

## 2014-12-06 MED ORDER — KETOROLAC TROMETHAMINE 60 MG/2ML IM SOLN
60.0000 mg | Freq: Once | INTRAMUSCULAR | Status: AC
  Administered 2014-12-06: 60 mg via INTRAMUSCULAR

## 2014-12-06 MED ORDER — PROPOFOL 10 MG/ML IV BOLUS
INTRAVENOUS | Status: AC
  Filled 2014-12-06: qty 20

## 2014-12-06 MED ORDER — SODIUM CHLORIDE 0.9 % IV SOLN: INTRAVENOUS | Status: DC

## 2014-12-06 SURGICAL SUPPLY — 22 items

## 2014-12-06 NOTE — Anesthesia Postprocedure Evaluation (Signed)
  Anesthesia Post-op Note  Patient: Dean Bowers  Procedure(s) Performed: Procedure(s) (LRB): COLONOSCOPY WITH PROPOFOL (N/A)  Patient Location: PACU  Anesthesia Type: MAC  Level of Consciousness: awake and alert   Airway and Oxygen Therapy: Patient Spontanous Breathing  Post-op Pain: mild  Post-op Assessment: Post-op Vital signs reviewed, Patient's Cardiovascular Status Stable, Respiratory Function Stable, Patent Airway and No signs of Nausea or vomiting  Last Vitals:  Filed Vitals:   12/06/14 1155  BP:   Pulse: 108  Temp:   Resp:     Post-op Vital Signs: stable   Complications: No apparent anesthesia complications

## 2014-12-06 NOTE — Discharge Instructions (Signed)

## 2014-12-06 NOTE — Op Note (Addendum)
[facility] [place], [address]   COLONOSCOPY PROCEDURE REPORT  PATIENT: Dean Bowers, Dean Bowers  MR#: [id number] BIRTHDATE: 01/03/1971 , 67  yrs. old GENDER: male ENDOSCOPIST: Jerene Bears, MD REFERRED BY: PROCEDURE DATE:  12/06/2014 PROCEDURE:   Colonoscopy, diagnostic and Colonoscopy with snare polypectomy First Screening Colonoscopy - Avg.  risk and is 50 yrs.  old or older - No.  Prior Negative Screening - Now for repeat screening. N/A  History of Adenoma - Now for follow-up colonoscopy & has been > or = to 3 yrs.  N/A  Polyps removed today? Yes ASA CLASS:   Class III INDICATIONS:iron deficiency anemia and heme-positive stool. MEDICATIONS: Monitored anesthesia care and Per Anesthesia  DESCRIPTION OF PROCEDURE:   After the risks benefits and alternatives of the procedure were thoroughly explained, informed consent was obtained.  The digital rectal exam revealed no rectal mass.   The Pentax Adult Colonoscope [id number]  endoscope was introduced through the anus and advanced to the cecum, which was identified by both the appendix and ileocecal valve. No adverse events experienced.   The quality of the prep was good.  (Suprep was used)  The instrument was then slowly withdrawn as the colon was fully examined. Estimated blood loss is zero unless otherwise noted in this procedure report.   COLON FINDINGS: A sessile polyp measuring 5 mm in size was found in the distal sigmoid colon.  A polypectomy was performed with a cold snare.  The resection was complete, the polyp tissue was completely retrieved and sent to histology.  Persistent mild oozing at the site was controlled using hemoclips.  One (1) placement was made. Small lipoma in the ascending colon. There was very mild diverticulosis noted in the sigmoid colon.   The examination was otherwise normal.  Retroflexed views revealed no abnormalities. The time to cecum = 3.2 Withdrawal time = 13.1   The  scope was withdrawn and the procedure completed. COMPLICATIONS: There were no immediate complications.  ENDOSCOPIC IMPRESSION: 1.   Sessile polyp was found in the distal sigmoid colon; polypectomy was performed with a cold snare; oozing at the site was controlled using hemoclip x 1 2.   Mild diverticulosis was noted in the sigmoid colon 3.   The examination was otherwise normal  RECOMMENDATIONS: 1.  Await pathology results, repeat colonoscopy interval to be based on pathology results 2.  High fiber diet 3.  Repeat CBC, IBC panel, ferritin, and celiac panel (TTG and IgA); this can be done at [facility] outpatient lab 4.  If persistent iron deficiency anemia would recommend upper endoscopy 5.  Can resume Xarelto tomorrow with usual dose  eSigned:  Jerene Bears, MD 12/06/2014 11:28 AM Revised: 12/06/2014 11:28 AM  cc: Jill Alexanders, MD and The Patient   PATIENT NAME:  Jerik, Falletta MR#: [id number]

## 2014-12-06 NOTE — Anesthesia Procedure Notes (Signed)
Procedure Name: MAC Date/Time: 12/06/2014 10:50 AM Performed by: Lollie Sails Oxygen Delivery Method: Simple face mask

## 2014-12-06 NOTE — Transfer of Care (Signed)
Immediate Anesthesia Transfer of Care Note  Patient: Dean Bowers  Procedure(s) Performed: Procedure(s): COLONOSCOPY WITH PROPOFOL (N/A)  Patient Location: PACU  Anesthesia Type:MAC  Level of Consciousness:  sedated, patient cooperative and responds to stimulation  Airway & Oxygen Therapy:Patient Spontanous Breathing and Patient connected to face mask oxgen  Post-op Assessment:  Report given to PACU RN and Post -op Vital signs reviewed and stable  Post vital signs:  Reviewed and stable  Last Vitals:  Filed Vitals:   12/06/14 1005  BP: 165/65  Pulse: 99  Temp: 36.9 C  Resp: 14    Complications: No apparent anesthesia complications

## 2014-12-06 NOTE — Anesthesia Preprocedure Evaluation (Addendum)
Anesthesia Evaluation  Patient identified by MRN, date of birth, ID band Patient awake    Reviewed: Allergy & Precautions, NPO status , Patient's Chart, lab work & pertinent test results  History of Anesthesia Complications (+) history of anesthetic complications (has required re-intubation postop)  Airway Mallampati: III  TM Distance: >3 FB Neck ROM: Full    Dental  (+) Dental Advisory Given, Poor Dentition   Pulmonary asthma , sleep apnea, Continuous Positive Airway Pressure Ventilation and Oxygen sleep apnea , COPD,  COPD inhaler and oxygen dependent, Current Smoker, former smoker,  H/o respiratory failure 1/16   + rhonchi  + decreased breath sounds      Cardiovascular hypertension, Pt. on medications (-) angina+ Peripheral Vascular Disease  Normal cardiovascular exam Rhythm:Regular Rate:Normal     Neuro/Psych Anxiety Chronic back pain: narcotics    GI/Hepatic GERD (GERDz/nausea last night)  Medicated,(+)     substance abuse  cocaine use and marijuana use,   Endo/Other  diabetesMorbid obesity  Renal/GU negative Renal ROS     Musculoskeletal   Abdominal (+) + obese,   Peds  Hematology  (+) Blood dyscrasia (Hb 9.2), ,   Anesthesia Other Findings   Reproductive/Obstetrics                            Anesthesia Physical Anesthesia Plan  ASA: IV  Anesthesia Plan: MAC   Post-op Pain Management:    Induction:   Airway Management Planned:   Additional Equipment:   Intra-op Plan:   Post-operative Plan:   Informed Consent:   Plan Discussed with: Surgeon  Anesthesia Plan Comments:         Anesthesia Quick Evaluation
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
## 2014-12-06 NOTE — Patient Instructions (Signed)
Use heating pad, and do buttock/hip stretches as shown. Use the flexeril for muscle spasm in buttocks--use full tablet at bedtime. You may need to decrease the dose to 1/2 tablet during the day if it is too sedating. I will need to check with Dr. Redmond School to see if he wants you to continue on the voltaren along with the xarelto.  Given your bruising and iron deficiency, I don't think it is a good idea. We are rechecking iron levels, as well as electrolytes with today's bloodwork.  Plan to f/u with Dr. Redmond School later this week if your pain isn't improving.

## 2014-12-07 ENCOUNTER — Encounter (HOSPITAL_COMMUNITY): Payer: Self-pay | Admitting: Internal Medicine

## 2014-12-07 ENCOUNTER — Telehealth: Payer: Self-pay | Admitting: Family Medicine

## 2014-12-07 NOTE — Telephone Encounter (Signed)
Wife states pt is no better today and wants to see about getting the Rx for prednisone that you'll had discussed or whatever you think is best.

## 2014-12-07 NOTE — Telephone Encounter (Signed)
S/w Dr. Tomi Bamberger and then called wife and advised that Dr. Tomi Bamberger wants to see pt's labs and discuss with [facility] tomorrow regarding pt and someone will call her back tomorrow.  Advised continue with heat & pain med & muscle relaxers.

## 2014-12-08 ENCOUNTER — Encounter: Payer: Self-pay | Admitting: Internal Medicine

## 2014-12-08 ENCOUNTER — Telehealth: Payer: Self-pay | Admitting: *Deleted

## 2014-12-08 NOTE — Telephone Encounter (Signed)
I just realized that Mickel Baas told her someone would call her tomorrow.  I spoke with Dr. Redmond School who agrees with my plan.  Agrees that physical therapy would be first option, rather than a steroid course.  Can schedule f/u with Dr. Redmond School if not improving. Okay for PT referral now, if desired, if still in significant pain and not improving. Further matters regarding this should go to Dr. Redmond School, as he has been updated on what has occurred.

## 2014-12-08 NOTE — Telephone Encounter (Signed)
I have called and talked with Dean Bowers

## 2014-12-08 NOTE — Telephone Encounter (Signed)
I really can't call something else and I do not think prednisone would be appropriate. Another option would be for him to see his orthopedic doctor

## 2014-12-08 NOTE — Telephone Encounter (Signed)
Wife called, patient saw Dr. Tomi Bamberger Monday, is no better. Can't even walk and would be hard for him to come in and follow up with you. Is there something else you can try for him, ie: meds that can be called in. Not sure what the next step in. Patient's wife mentioned maybe the possibility of prednisone? Or do you want patient to come in for an appt? Please advise.

## 2014-12-08 NOTE — Telephone Encounter (Signed)
Patient advised.

## 2014-12-25 ENCOUNTER — Other Ambulatory Visit: Payer: Self-pay | Admitting: Family Medicine

## 2014-12-26 ENCOUNTER — Other Ambulatory Visit: Payer: Self-pay | Admitting: Family Medicine

## 2014-12-28 ENCOUNTER — Other Ambulatory Visit: Payer: Self-pay | Admitting: Orthopedic Surgery

## 2014-12-28 DIAGNOSIS — M545 Low back pain: Secondary | ICD-10-CM

## 2015-01-04 ENCOUNTER — Ambulatory Visit
Admission: RE | Admit: 2015-01-04 | Discharge: 2015-01-04 | Disposition: A | Discharge status: Home / Self Care | Payer: 59 | Source: Ambulatory Visit | Attending: Orthopedic Surgery | Admitting: Orthopedic Surgery

## 2015-01-04 DIAGNOSIS — M545 Low back pain: Secondary | ICD-10-CM

## 2015-01-06 ENCOUNTER — Telehealth: Payer: Self-pay | Admitting: Family Medicine

## 2015-01-06 NOTE — Telephone Encounter (Signed)
Wife, Constance Holster, called requesting:  1 -  a script for compression socks for pt   2 -  to know when pt need to been seen again to follow up on Androgel script  Also Constance Holster will be faxing the normal disability form that Dr Redmond School has completed before

## 2015-01-07 NOTE — Telephone Encounter (Signed)
Spoke to Dean Bowers and she signed you just need to write compression sock on a script and she will take it to get it filled. He uses XL though. She will call back and schedule an appt for march to follow-up on his testosterone. He is good on his testosterone med for now but will call back if he needs a refill. Please write script and i will call her Monday to come pick up as she knows you are not here Friday 12/2

## 2015-01-07 NOTE — Telephone Encounter (Signed)
Get more info on the stockings and he will need follow up on testosterone in March

## 2015-01-10 NOTE — Telephone Encounter (Signed)
Done

## 2015-01-10 NOTE — Telephone Encounter (Signed)
Wife norma notified that script is ready

## 2015-01-11 ENCOUNTER — Ambulatory Visit (INDEPENDENT_AMBULATORY_CARE_PROVIDER_SITE_OTHER): Payer: 59 | Admitting: Internal Medicine

## 2015-01-11 ENCOUNTER — Encounter: Payer: Self-pay | Admitting: Internal Medicine

## 2015-01-11 ENCOUNTER — Other Ambulatory Visit (INDEPENDENT_AMBULATORY_CARE_PROVIDER_SITE_OTHER): Payer: 59

## 2015-01-11 VITALS — BP 138/82 | HR 116 | Temp 98.2°F | Resp 18 | Ht 70.0 in | Wt >= 6400 oz

## 2015-01-11 DIAGNOSIS — S98911D Complete traumatic amputation of right foot, level unspecified, subsequent encounter: Secondary | ICD-10-CM

## 2015-01-11 DIAGNOSIS — R7301 Impaired fasting glucose: Secondary | ICD-10-CM

## 2015-01-11 DIAGNOSIS — I2699 Other pulmonary embolism without acute cor pulmonale: Secondary | ICD-10-CM

## 2015-01-11 DIAGNOSIS — G4733 Obstructive sleep apnea (adult) (pediatric): Secondary | ICD-10-CM

## 2015-01-11 DIAGNOSIS — I10 Essential (primary) hypertension: Secondary | ICD-10-CM

## 2015-01-11 DIAGNOSIS — R7989 Other specified abnormal findings of blood chemistry: Secondary | ICD-10-CM

## 2015-01-11 DIAGNOSIS — F1910 Other psychoactive substance abuse, uncomplicated: Secondary | ICD-10-CM

## 2015-01-11 DIAGNOSIS — F17200 Nicotine dependence, unspecified, uncomplicated: Secondary | ICD-10-CM

## 2015-01-11 DIAGNOSIS — Z720 Tobacco use: Secondary | ICD-10-CM

## 2015-01-11 DIAGNOSIS — R7302 Impaired glucose tolerance (oral): Secondary | ICD-10-CM

## 2015-01-11 LAB — COMPREHENSIVE METABOLIC PANEL
ALK PHOS: 64 U/L (ref 39–117)
ALT: 33 U/L (ref 0–53)
AST: 19 U/L (ref 0–37)
Albumin: 3.9 g/dL (ref 3.5–5.2)
BUN: 17 mg/dL (ref 6–23)
CHLORIDE: 98 meq/L (ref 96–112)
CO2: 33 meq/L — AB (ref 19–32)
Calcium: 9.6 mg/dL (ref 8.4–10.5)
Creatinine, Ser: 0.81 mg/dL (ref 0.40–1.50)
GFR: 109.35 mL/min (ref >60.00)
GLUCOSE: 101 mg/dL — AB (ref 70–99)
POTASSIUM: 3.8 meq/L (ref 3.5–5.1)
Sodium: 140 mEq/L (ref 135–145)
TOTAL PROTEIN: 7.7 g/dL (ref 6.0–8.3)
Total Bilirubin: 0.2 mg/dL (ref 0.2–1.2)

## 2015-01-11 LAB — LIPID PANEL
Cholesterol: 227 mg/dL — ABNORMAL HIGH (ref 0–200)
HDL: 44 mg/dL (ref >39.00)
NonHDL: 183.21
Total CHOL/HDL Ratio: 5
Triglycerides: 222 mg/dL — ABNORMAL HIGH (ref 0.0–149.0)
VLDL: 44.4 mg/dL — AB (ref 0.0–40.0)

## 2015-01-11 LAB — CBC
HEMATOCRIT: 45.7 % (ref 39.0–52.0)
Hemoglobin: 14.5 g/dL (ref 13.0–17.0)
MCHC: 31.7 g/dL (ref 30.0–36.0)
MCV: 76.4 fl — ABNORMAL LOW (ref 78.0–100.0)
Platelets: 276 10*3/uL (ref 150.0–400.0)
RBC: 5.98 Mil/uL — ABNORMAL HIGH (ref 4.22–5.81)
RDW: 16.8 % — AB (ref 11.5–15.5)
WBC: 9.1 10*3/uL (ref 4.0–10.5)

## 2015-01-11 LAB — HEMOGLOBIN A1C: HEMOGLOBIN A1C: 6.4 % (ref 4.6–6.5)

## 2015-01-11 LAB — LDL CHOLESTEROL, DIRECT: LDL DIRECT: 144 mg/dL

## 2015-01-11 MED ORDER — OXYCODONE-ACETAMINOPHEN 5-325 MG PO TABS: 1.0000 | ORAL_TABLET | Freq: Two times a day (BID) | ORAL | Status: DC | PRN

## 2015-01-11 NOTE — Assessment & Plan Note (Signed)
Using CPAP at home. 
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
## 2015-01-11 NOTE — Assessment & Plan Note (Signed)
Prosthesis in place.

## 2015-01-11 NOTE — Assessment & Plan Note (Addendum)
BP elevated initially and then recheck was normal. Taking diltiazem and torsemide. Checking CMP today and adjust as needed.

## 2015-01-11 NOTE — Assessment & Plan Note (Signed)
Advised that he should quit smoking as he already has signs of damage to his lungs. He is not ready to quit right now. Tried and failed chantix.

## 2015-01-11 NOTE — Assessment & Plan Note (Signed)
Checking HgA1c today and adjust as needed. With family history of diabetes and his weight would watch closely for progression to diabetes.

## 2015-01-11 NOTE — Patient Instructions (Addendum)
We will check the labs today and call you back with the results.   We have given you the pain medicine prescription today as well.   Diabetes and Exercise Exercising regularly is important. It is not just about losing weight. It has many health benefits, such as:  Improving your overall fitness, flexibility, and endurance.  Increasing your bone density.  Helping with weight control.  Decreasing your body fat.  Increasing your muscle strength.  Reducing stress and tension.  Improving your overall health. People with diabetes who exercise gain additional benefits because exercise:  Reduces appetite.  Improves the body's use of blood sugar (glucose).  Helps lower or control blood glucose.  Decreases blood pressure.  Helps control blood lipids (such as cholesterol and triglycerides).  Improves the body's use of the hormone insulin by:  Increasing the body's insulin sensitivity.  Reducing the body's insulin needs.  Decreases the risk for heart disease because exercising:  Lowers cholesterol and triglycerides levels.  Increases the levels of good cholesterol (such as high-density lipoproteins [HDL]) in the body.  Lowers blood glucose levels. YOUR ACTIVITY PLAN  Choose an activity that you enjoy, and set realistic goals. To exercise safely, you should begin practicing any new physical activity slowly, and gradually increase the intensity of the exercise over time. Your health care provider or diabetes educator can help create an activity plan that works for you. General recommendations include:  Encouraging children to engage in at least 60 minutes of physical activity each day.  Stretching and performing strength training exercises, such as yoga or weight lifting, at least 2 times per week.  Performing a total of at least 150 minutes of moderate-intensity exercise each week, such as brisk walking or water aerobics.  Exercising at least 3 days per week, making sure you  allow no more than 2 consecutive days to pass without exercising.  Avoiding long periods of inactivity (90 minutes or more). When you have to spend an extended period of time sitting down, take frequent breaks to walk or stretch. RECOMMENDATIONS FOR EXERCISING WITH TYPE 1 OR TYPE 2 DIABETES   Check your blood glucose before exercising. If blood glucose levels are greater than 240 mg/dL, check for urine ketones. Do not exercise if ketones are present.  Avoid injecting insulin into areas of the body that are going to be exercised. For example, avoid injecting insulin into:  The arms when playing tennis.  The legs when jogging.  Keep a record of:  Food intake before and after you exercise.  Expected peak times of insulin action.  Blood glucose levels before and after you exercise.  The type and amount of exercise you have done.  Review your records with your health care provider. Your health care provider will help you to develop guidelines for adjusting food intake and insulin amounts before and after exercising.  If you take insulin or oral hypoglycemic agents, watch for signs and symptoms of hypoglycemia. They include:  Dizziness.  Shaking.  Sweating.  Chills.  Confusion.  Drink plenty of water while you exercise to prevent dehydration or heat stroke. Body water is lost during exercise and must be replaced.  Talk to your health care provider before starting an exercise program to make sure it is safe for you. Remember, almost any type of activity is better than none.   This information is not intended to replace advice given to you by your health care provider. Make sure you discuss any questions you have with your health  care provider.   Document Released: 04/14/2003 Document Revised: 06/08/2014 Document Reviewed: 07/01/2012 Elsevier Interactive Patient Education [employer].

## 2015-01-11 NOTE — Progress Notes (Signed)
Pre visit review using our clinic review tool, if applicable. No additional management support is needed unless otherwise documented below in the visit note. 

## 2015-01-11 NOTE — Assessment & Plan Note (Signed)
Talked to him about the fact that his weight is significantly affecting his health. He will work on being more active since he will be getting pain relief.
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
## 2015-01-11 NOTE — Assessment & Plan Note (Signed)
Hx of cocaine and still occasional marijuana.

## 2015-01-20 ENCOUNTER — Telehealth: Payer: Self-pay | Admitting: Family Medicine

## 2015-01-20 NOTE — Telephone Encounter (Signed)
Wife called about fax that was faxed several days ago, and I had no record of it.  She refaxed and I recv'd fax from [place] needing info for pt's insurance. Put in Dr. Lanice Shirts folder for completion

## 2015-01-21 NOTE — Telephone Encounter (Signed)
Form completed, Called & left message

## 2015-01-23 ENCOUNTER — Other Ambulatory Visit: Payer: Self-pay | Admitting: Family Medicine

## 2015-01-25 ENCOUNTER — Other Ambulatory Visit: Payer: Self-pay | Admitting: Family Medicine

## 2015-01-28 ENCOUNTER — Telehealth: Payer: Self-pay | Admitting: Family Medicine

## 2015-01-28 NOTE — Telephone Encounter (Signed)
Pt wife requested claim form for continuing disability insurance  Benefits to be faxed to her since she has not been able to come and get it

## 2015-02-21 ENCOUNTER — Telehealth: Payer: Self-pay | Admitting: Pulmonary Disease

## 2015-02-21 ENCOUNTER — Other Ambulatory Visit: Payer: Self-pay | Admitting: Family Medicine

## 2015-02-21 MED ORDER — TRAZODONE HCL 50 MG PO TABS: 50.0000 mg | ORAL_TABLET | Freq: Every day | ORAL | Status: DC

## 2015-02-21 NOTE — Telephone Encounter (Signed)
Is this okay to refill? 

## 2015-02-21 NOTE — Telephone Encounter (Signed)
Trazodone 50 mg qhs, #30, with 2 refills.

## 2015-02-21 NOTE — Telephone Encounter (Signed)
pts wife returning call [phone]

## 2015-02-21 NOTE — Telephone Encounter (Signed)
Spoke with spouse and is aware of recs below. RX sent in. Nothing further needed

## 2015-02-21 NOTE — Telephone Encounter (Signed)
lmtcb x1 for pt's wife. 

## 2015-02-21 NOTE — Telephone Encounter (Signed)
Spoke with pt's wife. States that the pt is having issues with staying asleep. He does not have a problem with falling asleep, it's staying asleep he has issues with. Wakes up every hour on the hour. He is still currently using his CPAP machine. Does have issues with the hoses disconnecting when he rolls over on his side during sleep. Pt's wife is requesting recommendations for medications that he can take to help him stay asleep during the night.  VS - please advise. Thanks.

## 2015-02-22 ENCOUNTER — Telehealth: Payer: Self-pay | Admitting: Family Medicine

## 2015-02-22 ENCOUNTER — Telehealth: Payer: Self-pay | Admitting: Internal Medicine

## 2015-02-22 MED ORDER — OXYCODONE-ACETAMINOPHEN 5-325 MG PO TABS: 1.0000 | ORAL_TABLET | Freq: Two times a day (BID) | ORAL | Status: DC | PRN

## 2015-02-22 NOTE — Telephone Encounter (Signed)
Wife called back that pt is seeing a different doctor

## 2015-02-22 NOTE — Telephone Encounter (Signed)
Needs UDS at pick up.  

## 2015-02-22 NOTE — Telephone Encounter (Signed)
Pt requesting refill for oxyCODONE-acetaminophen (ROXICET) 5-325 MG tablet [id number]

## 2015-02-22 NOTE — Telephone Encounter (Signed)
Recv'd P.A. Sildenafil.  After looking at chart called pt & wife & left message, I believer pt has transferred care to another doctor

## 2015-02-22 NOTE — Telephone Encounter (Signed)
Placed up front and patient is aware.

## 2015-02-24 ENCOUNTER — Encounter (HOSPITAL_COMMUNITY): Payer: Self-pay | Admitting: Family Medicine

## 2015-02-24 ENCOUNTER — Other Ambulatory Visit: Payer: Self-pay | Admitting: Family Medicine

## 2015-02-24 ENCOUNTER — Inpatient Hospital Stay (HOSPITAL_COMMUNITY)
Admission: EM | Admit: 2015-02-24 | Discharge: 2015-03-03 | DRG: 208 | Disposition: A | Discharge status: Home Health | Payer: 59 | Attending: Family Medicine | Admitting: Family Medicine

## 2015-02-24 ENCOUNTER — Emergency Department (HOSPITAL_COMMUNITY): Payer: 59

## 2015-02-24 DIAGNOSIS — I509 Heart failure, unspecified: Secondary | ICD-10-CM

## 2015-02-24 DIAGNOSIS — Z981 Arthrodesis status: Secondary | ICD-10-CM

## 2015-02-24 DIAGNOSIS — I110 Hypertensive heart disease with heart failure: Secondary | ICD-10-CM | POA: Diagnosis present

## 2015-02-24 DIAGNOSIS — J9690 Respiratory failure, unspecified, unspecified whether with hypoxia or hypercapnia: Secondary | ICD-10-CM | POA: Diagnosis present

## 2015-02-24 DIAGNOSIS — Z9289 Personal history of other medical treatment: Secondary | ICD-10-CM

## 2015-02-24 DIAGNOSIS — G8929 Other chronic pain: Secondary | ICD-10-CM | POA: Diagnosis present

## 2015-02-24 DIAGNOSIS — Z7982 Long term (current) use of aspirin: Secondary | ICD-10-CM | POA: Diagnosis not present

## 2015-02-24 DIAGNOSIS — I2699 Other pulmonary embolism without acute cor pulmonale: Secondary | ICD-10-CM | POA: Diagnosis present

## 2015-02-24 DIAGNOSIS — Z825 Family history of asthma and other chronic lower respiratory diseases: Secondary | ICD-10-CM | POA: Diagnosis not present

## 2015-02-24 DIAGNOSIS — Z8249 Family history of ischemic heart disease and other diseases of the circulatory system: Secondary | ICD-10-CM

## 2015-02-24 DIAGNOSIS — F1910 Other psychoactive substance abuse, uncomplicated: Secondary | ICD-10-CM | POA: Diagnosis present

## 2015-02-24 DIAGNOSIS — I10 Essential (primary) hypertension: Secondary | ICD-10-CM | POA: Diagnosis not present

## 2015-02-24 DIAGNOSIS — J9621 Acute and chronic respiratory failure with hypoxia: Secondary | ICD-10-CM | POA: Diagnosis present

## 2015-02-24 DIAGNOSIS — Z7901 Long term (current) use of anticoagulants: Secondary | ICD-10-CM

## 2015-02-24 DIAGNOSIS — J9602 Acute respiratory failure with hypercapnia: Secondary | ICD-10-CM

## 2015-02-24 DIAGNOSIS — J4531 Mild persistent asthma with (acute) exacerbation: Secondary | ICD-10-CM

## 2015-02-24 DIAGNOSIS — Z978 Presence of other specified devices: Secondary | ICD-10-CM

## 2015-02-24 DIAGNOSIS — E1151 Type 2 diabetes mellitus with diabetic peripheral angiopathy without gangrene: Secondary | ICD-10-CM | POA: Diagnosis present

## 2015-02-24 DIAGNOSIS — E1165 Type 2 diabetes mellitus with hyperglycemia: Secondary | ICD-10-CM | POA: Diagnosis present

## 2015-02-24 DIAGNOSIS — Z79899 Other long term (current) drug therapy: Secondary | ICD-10-CM | POA: Diagnosis not present

## 2015-02-24 DIAGNOSIS — E876 Hypokalemia: Secondary | ICD-10-CM | POA: Diagnosis present

## 2015-02-24 DIAGNOSIS — R0602 Shortness of breath: Secondary | ICD-10-CM

## 2015-02-24 DIAGNOSIS — I2781 Cor pulmonale (chronic): Secondary | ICD-10-CM | POA: Diagnosis present

## 2015-02-24 DIAGNOSIS — Z9109 Other allergy status, other than to drugs and biological substances: Secondary | ICD-10-CM | POA: Diagnosis not present

## 2015-02-24 DIAGNOSIS — G9340 Encephalopathy, unspecified: Secondary | ICD-10-CM | POA: Diagnosis present

## 2015-02-24 DIAGNOSIS — E662 Morbid (severe) obesity with alveolar hypoventilation: Secondary | ICD-10-CM | POA: Diagnosis not present

## 2015-02-24 DIAGNOSIS — J9622 Acute and chronic respiratory failure with hypercapnia: Secondary | ICD-10-CM | POA: Diagnosis present

## 2015-02-24 DIAGNOSIS — E870 Hyperosmolality and hypernatremia: Secondary | ICD-10-CM | POA: Diagnosis present

## 2015-02-24 DIAGNOSIS — E785 Hyperlipidemia, unspecified: Secondary | ICD-10-CM | POA: Diagnosis present

## 2015-02-24 DIAGNOSIS — R601 Generalized edema: Secondary | ICD-10-CM

## 2015-02-24 DIAGNOSIS — Z89511 Acquired absence of right leg below knee: Secondary | ICD-10-CM

## 2015-02-24 DIAGNOSIS — F419 Anxiety disorder, unspecified: Secondary | ICD-10-CM | POA: Diagnosis present

## 2015-02-24 DIAGNOSIS — Z86711 Personal history of pulmonary embolism: Secondary | ICD-10-CM | POA: Diagnosis not present

## 2015-02-24 DIAGNOSIS — D638 Anemia in other chronic diseases classified elsewhere: Secondary | ICD-10-CM | POA: Diagnosis present

## 2015-02-24 DIAGNOSIS — T502X5A Adverse effect of carbonic-anhydrase inhibitors, benzothiadiazides and other diuretics, initial encounter: Secondary | ICD-10-CM | POA: Diagnosis not present

## 2015-02-24 DIAGNOSIS — J449 Chronic obstructive pulmonary disease, unspecified: Secondary | ICD-10-CM | POA: Diagnosis present

## 2015-02-24 DIAGNOSIS — I5033 Acute on chronic diastolic (congestive) heart failure: Secondary | ICD-10-CM | POA: Diagnosis not present

## 2015-02-24 DIAGNOSIS — Z9119 Patient's noncompliance with other medical treatment and regimen: Secondary | ICD-10-CM | POA: Diagnosis not present

## 2015-02-24 DIAGNOSIS — N179 Acute kidney failure, unspecified: Secondary | ICD-10-CM | POA: Diagnosis present

## 2015-02-24 DIAGNOSIS — M549 Dorsalgia, unspecified: Secondary | ICD-10-CM | POA: Diagnosis present

## 2015-02-24 DIAGNOSIS — F17210 Nicotine dependence, cigarettes, uncomplicated: Secondary | ICD-10-CM | POA: Diagnosis present

## 2015-02-24 DIAGNOSIS — D72829 Elevated white blood cell count, unspecified: Secondary | ICD-10-CM | POA: Diagnosis present

## 2015-02-24 DIAGNOSIS — E1169 Type 2 diabetes mellitus with other specified complication: Secondary | ICD-10-CM | POA: Diagnosis present

## 2015-02-24 DIAGNOSIS — G4733 Obstructive sleep apnea (adult) (pediatric): Secondary | ICD-10-CM | POA: Diagnosis present

## 2015-02-24 DIAGNOSIS — R0600 Dyspnea, unspecified: Secondary | ICD-10-CM | POA: Diagnosis present

## 2015-02-24 DIAGNOSIS — E872 Acidosis: Secondary | ICD-10-CM | POA: Diagnosis present

## 2015-02-24 DIAGNOSIS — Z9981 Dependence on supplemental oxygen: Secondary | ICD-10-CM | POA: Diagnosis not present

## 2015-02-24 DIAGNOSIS — E8779 Other fluid overload: Secondary | ICD-10-CM

## 2015-02-24 DIAGNOSIS — I5043 Acute on chronic combined systolic (congestive) and diastolic (congestive) heart failure: Secondary | ICD-10-CM | POA: Diagnosis present

## 2015-02-24 DIAGNOSIS — Z86718 Personal history of other venous thrombosis and embolism: Secondary | ICD-10-CM

## 2015-02-24 DIAGNOSIS — R600 Localized edema: Secondary | ICD-10-CM | POA: Diagnosis not present

## 2015-02-24 DIAGNOSIS — J9601 Acute respiratory failure with hypoxia: Secondary | ICD-10-CM | POA: Diagnosis not present

## 2015-02-24 DIAGNOSIS — J45909 Unspecified asthma, uncomplicated: Secondary | ICD-10-CM | POA: Diagnosis present

## 2015-02-24 DIAGNOSIS — Z01818 Encounter for other preprocedural examination: Secondary | ICD-10-CM

## 2015-02-24 DIAGNOSIS — Z4659 Encounter for fitting and adjustment of other gastrointestinal appliance and device: Secondary | ICD-10-CM

## 2015-02-24 DIAGNOSIS — J984 Other disorders of lung: Secondary | ICD-10-CM | POA: Diagnosis present

## 2015-02-24 DIAGNOSIS — I50810 Right heart failure, unspecified: Secondary | ICD-10-CM

## 2015-02-24 DIAGNOSIS — Z6841 Body Mass Index (BMI) 40.0 and over, adult: Secondary | ICD-10-CM | POA: Diagnosis not present

## 2015-02-24 LAB — CBC
HCT: 45.2 % (ref 39.0–52.0)
HEMOGLOBIN: 13.1 g/dL (ref 13.0–17.0)
MCH: 22.6 pg — AB (ref 26.0–34.0)
MCHC: 29 g/dL — AB (ref 30.0–36.0)
MCV: 78.1 fL (ref 78.0–100.0)
Platelets: 211 10*3/uL (ref 150–400)
RBC: 5.79 MIL/uL (ref 4.22–5.81)
RDW: 17.6 % — AB (ref 11.5–15.5)
WBC: 13.9 10*3/uL — ABNORMAL HIGH (ref 4.0–10.5)

## 2015-02-24 LAB — BASIC METABOLIC PANEL
ANION GAP: 10 (ref 5–15)
BUN: 18 mg/dL (ref 6–20)
CALCIUM: 9.6 mg/dL (ref 8.9–10.3)
CO2: 38 mmol/L — AB (ref 22–32)
CREATININE: 1.18 mg/dL (ref 0.61–1.24)
Chloride: 92 mmol/L — ABNORMAL LOW (ref 101–111)
GFR calc Af Amer: >60 mL/min (ref >60)
GFR calc non Af Amer: >60 mL/min (ref >60)
GLUCOSE: 182 mg/dL — AB (ref 65–99)
Potassium: 3.4 mmol/L — ABNORMAL LOW (ref 3.5–5.1)
Sodium: 140 mmol/L (ref 135–145)

## 2015-02-24 LAB — I-STAT ARTERIAL BLOOD GAS, ED
Acid-Base Excess: 12 mmol/L — ABNORMAL HIGH (ref 0.0–2.0)
Bicarbonate: 43.2 mEq/L — ABNORMAL HIGH (ref 20.0–24.0)
O2 SAT: 85 %
PCO2 ART: 84 mmHg — AB (ref 35.0–45.0)
PO2 ART: 58 mmHg — AB (ref 80.0–100.0)
Patient temperature: 98.7
TCO2: 46 mmol/L (ref 0–100)
pH, Arterial: 7.319 — ABNORMAL LOW (ref 7.350–7.450)

## 2015-02-24 LAB — BRAIN NATRIURETIC PEPTIDE: B Natriuretic Peptide: 107.4 pg/mL — ABNORMAL HIGH (ref 0.0–100.0)

## 2015-02-24 LAB — I-STAT TROPONIN, ED: TROPONIN I, POC: 0 ng/mL (ref 0.00–0.08)

## 2015-02-24 MED ORDER — ACETAMINOPHEN 500 MG PO TABS
1000.0000 mg | ORAL_TABLET | Freq: Once | ORAL | Status: AC
  Administered 2015-02-24: 1000 mg via ORAL
  Filled 2015-02-24: qty 2

## 2015-02-24 MED ORDER — FUROSEMIDE 10 MG/ML IJ SOLN
80.0000 mg | Freq: Once | INTRAMUSCULAR | Status: AC
  Administered 2015-02-24: 80 mg via INTRAVENOUS
  Filled 2015-02-24: qty 8

## 2015-02-24 NOTE — Progress Notes (Signed)
   02/24/15 2225  BiPAP/CPAP/SIPAP  BiPAP/CPAP/SIPAP Pt Type Adult  Mask Type Full face mask  Mask Size Large  Set Rate 12 breaths/min  Respiratory Rate 21 breaths/min  IPAP 16 cmH20  EPAP 6 cmH2O  Oxygen Percent 40 %  Flow Rate 0 lpm  Minute Ventilation 9.1  Leak 43  Peak Inspiratory Pressure (PIP) 17  Tidal Volume (Vt) 451  BiPAP/CPAP/SIPAP BiPAP  Patient Home Equipment No  Auto Titrate No  BiPAP/CPAP /SiPAP Vitals  Pulse Rate 99  Resp 21  SpO2 94 %  Bilateral Breath Sounds Diminished  Patient placed on Bipap post ABG result. He appeared very somnolent but answers questions appropriately. RT will continue to monitor.
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
## 2015-02-24 NOTE — ED Notes (Signed)
Admitting MD at bedside.

## 2015-02-24 NOTE — ED Notes (Signed)
Pt moved to Bariatric bed

## 2015-02-24 NOTE — ED Notes (Signed)
Pt ripped BiPAP off; states he can put mask back on; Pt states he wants Cathter placed to make urinating easier; Md paged; Pt placed on 4L O2 and stating at 93% at this time;

## 2015-02-24 NOTE — ED Notes (Signed)
MD at bedside. 

## 2015-02-24 NOTE — ED Notes (Signed)
DR. Roel Cluck pg to [id number] @ 2321.
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
## 2015-02-24 NOTE — ED Notes (Signed)
Pt here for chest pain, SOB, and fluid retention. sts abd swelling and some bruising to abdomen.

## 2015-02-25 ENCOUNTER — Inpatient Hospital Stay (HOSPITAL_COMMUNITY): Payer: 59

## 2015-02-25 DIAGNOSIS — J9601 Acute respiratory failure with hypoxia: Secondary | ICD-10-CM

## 2015-02-25 DIAGNOSIS — I509 Heart failure, unspecified: Secondary | ICD-10-CM

## 2015-02-25 DIAGNOSIS — I5033 Acute on chronic diastolic (congestive) heart failure: Secondary | ICD-10-CM

## 2015-02-25 DIAGNOSIS — I10 Essential (primary) hypertension: Secondary | ICD-10-CM

## 2015-02-25 DIAGNOSIS — J9602 Acute respiratory failure with hypercapnia: Secondary | ICD-10-CM

## 2015-02-25 DIAGNOSIS — R601 Generalized edema: Secondary | ICD-10-CM

## 2015-02-25 DIAGNOSIS — Z7901 Long term (current) use of anticoagulants: Secondary | ICD-10-CM

## 2015-02-25 DIAGNOSIS — E662 Morbid (severe) obesity with alveolar hypoventilation: Secondary | ICD-10-CM

## 2015-02-25 LAB — BLOOD GAS, ARTERIAL
ACID-BASE EXCESS: 16.3 mmol/L — AB (ref 0.0–2.0)
Bicarbonate: 42.4 mEq/L — ABNORMAL HIGH (ref 20.0–24.0)
Drawn by: 275531
O2 Content: 5 L/min
O2 SAT: 77.6 %
PATIENT TEMPERATURE: 98.6
PCO2 ART: 74.6 mmHg — AB (ref 35.0–45.0)
TCO2: 44.7 mmol/L (ref 0–100)
pH, Arterial: 7.373 (ref 7.350–7.450)
pO2, Arterial: 45.7 mmHg — ABNORMAL LOW (ref 80.0–100.0)

## 2015-02-25 LAB — GLUCOSE, CAPILLARY
GLUCOSE-CAPILLARY: 123 mg/dL — AB (ref 65–99)
GLUCOSE-CAPILLARY: 117 mg/dL — AB (ref 65–99)
GLUCOSE-CAPILLARY: 101 mg/dL — AB (ref 65–99)

## 2015-02-25 LAB — I-STAT ARTERIAL BLOOD GAS, ED
Acid-Base Excess: 13 mmol/L — ABNORMAL HIGH (ref 0.0–2.0)
Bicarbonate: 44 mEq/L — ABNORMAL HIGH (ref 20.0–24.0)
O2 Saturation: 89 %
PCO2 ART: 85.8 mmHg — AB (ref 35.0–45.0)
PH ART: 7.318 — AB (ref 7.350–7.450)
Patient temperature: 98.6
TCO2: 47 mmol/L (ref 0–100)
pO2, Arterial: 67 mmHg — ABNORMAL LOW (ref 80.0–100.0)

## 2015-02-25 LAB — TROPONIN I
TROPONIN I: 0.06 ng/mL — AB (ref <0.031)
Troponin I: 0.09 ng/mL — ABNORMAL HIGH (ref <0.031)

## 2015-02-25 LAB — POCT I-STAT 3, ART BLOOD GAS (G3+)
Acid-Base Excess: 18 mmol/L — ABNORMAL HIGH (ref 0.0–2.0)
Bicarbonate: 49.3 mEq/L — ABNORMAL HIGH (ref 20.0–24.0)
O2 Saturation: 99 %
PCO2 ART: 90.8 mmHg — AB (ref 35.0–45.0)
PH ART: 7.342 — AB (ref 7.350–7.450)
Patient temperature: 98.2
TCO2: >50 mmol/L (ref 0–100)
pO2, Arterial: 153 mmHg — ABNORMAL HIGH (ref 80.0–100.0)

## 2015-02-25 LAB — BASIC METABOLIC PANEL
Anion gap: 13 (ref 5–15)
BUN: 15 mg/dL (ref 6–20)
CALCIUM: 8.9 mg/dL (ref 8.9–10.3)
CO2: 36 mmol/L — AB (ref 22–32)
Chloride: 92 mmol/L — ABNORMAL LOW (ref 101–111)
Creatinine, Ser: 0.89 mg/dL (ref 0.61–1.24)
GFR calc Af Amer: >60 mL/min (ref >60)
GLUCOSE: 140 mg/dL — AB (ref 65–99)
Potassium: 3.6 mmol/L (ref 3.5–5.1)
Sodium: 141 mmol/L (ref 135–145)

## 2015-02-25 LAB — RAPID URINE DRUG SCREEN, HOSP PERFORMED
Amphetamines: NOT DETECTED
BENZODIAZEPINES: NOT DETECTED
Barbiturates: NOT DETECTED
Cocaine: NOT DETECTED
Opiates: NOT DETECTED
Tetrahydrocannabinol: NOT DETECTED

## 2015-02-25 LAB — URINALYSIS, ROUTINE W REFLEX MICROSCOPIC
Bilirubin Urine: NEGATIVE
Glucose, UA: NEGATIVE mg/dL
Hgb urine dipstick: NEGATIVE
Ketones, ur: NEGATIVE mg/dL
LEUKOCYTES UA: NEGATIVE
Nitrite: NEGATIVE
PROTEIN: NEGATIVE mg/dL
Specific Gravity, Urine: 1.01 (ref 1.005–1.030)
pH: 6.5 (ref 5.0–8.0)

## 2015-02-25 LAB — MRSA PCR SCREENING: MRSA by PCR: NEGATIVE

## 2015-02-25 MED ORDER — SODIUM CHLORIDE 0.9 % IJ SOLN
10.0000 mL | Freq: Two times a day (BID) | INTRAMUSCULAR | Status: DC
  Administered 2015-02-25 – 2015-02-28 (×5): 10 mL
  Administered 2015-03-01 (×2): 30 mL

## 2015-02-25 MED ORDER — SODIUM CHLORIDE 0.9 % IJ SOLN: 3.0000 mL | INTRAMUSCULAR | Status: DC | PRN

## 2015-02-25 MED ORDER — DILTIAZEM HCL ER COATED BEADS 120 MG PO CP24
120.0000 mg | ORAL_CAPSULE | Freq: Every day | ORAL | Status: DC
  Administered 2015-02-25 – 2015-02-26 (×2): 120 mg via ORAL
  Filled 2015-02-25 (×3): qty 1

## 2015-02-25 MED ORDER — INSULIN ASPART 100 UNIT/ML ~~LOC~~ SOLN
0.0000 [IU] | Freq: Three times a day (TID) | SUBCUTANEOUS | Status: DC
  Administered 2015-02-25 – 2015-02-27 (×3): 1 [IU] via SUBCUTANEOUS

## 2015-02-25 MED ORDER — MIDAZOLAM HCL 2 MG/2ML IJ SOLN
INTRAMUSCULAR | Status: AC
  Administered 2015-02-25: 2 mg via INTRAVENOUS
  Filled 2015-02-25: qty 2

## 2015-02-25 MED ORDER — PANTOPRAZOLE SODIUM 40 MG IV SOLR
40.0000 mg | INTRAVENOUS | Status: DC
  Administered 2015-02-25 – 2015-03-01 (×5): 40 mg via INTRAVENOUS
  Filled 2015-02-25 (×5): qty 40

## 2015-02-25 MED ORDER — MIDAZOLAM HCL 2 MG/2ML IJ SOLN
2.0000 mg | Freq: Once | INTRAMUSCULAR | Status: AC
  Administered 2015-02-25: 2 mg via INTRAVENOUS

## 2015-02-25 MED ORDER — ETOMIDATE 2 MG/ML IV SOLN
20.0000 mg | Freq: Once | INTRAVENOUS | Status: AC
  Administered 2015-02-25: 20 mg via INTRAVENOUS

## 2015-02-25 MED ORDER — OXYCODONE-ACETAMINOPHEN 5-325 MG PO TABS: 1.0000 | ORAL_TABLET | Freq: Four times a day (QID) | ORAL | Status: DC | PRN

## 2015-02-25 MED ORDER — SODIUM CHLORIDE 0.9 % IJ SOLN: 10.0000 mL | INTRAMUSCULAR | Status: DC | PRN

## 2015-02-25 MED ORDER — FENTANYL CITRATE (PF) 100 MCG/2ML IJ SOLN
INTRAMUSCULAR | Status: AC
  Administered 2015-02-25: 100 ug via INTRAVENOUS
  Filled 2015-02-25: qty 2

## 2015-02-25 MED ORDER — ALPRAZOLAM 0.5 MG PO TABS
0.5000 mg | ORAL_TABLET | Freq: Three times a day (TID) | ORAL | Status: DC | PRN
  Administered 2015-02-28: 0.5 mg via ORAL
  Filled 2015-02-25: qty 1

## 2015-02-25 MED ORDER — FENTANYL CITRATE (PF) 100 MCG/2ML IJ SOLN
100.0000 ug | Freq: Once | INTRAMUSCULAR | Status: AC
  Administered 2015-02-25: 100 ug via INTRAVENOUS

## 2015-02-25 MED ORDER — CHLORHEXIDINE GLUCONATE 0.12% ORAL RINSE (MEDLINE KIT)
15.0000 mL | Freq: Two times a day (BID) | OROMUCOSAL | Status: DC
  Administered 2015-02-25 – 2015-03-02 (×9): 15 mL via OROMUCOSAL

## 2015-02-25 MED ORDER — ALBUTEROL SULFATE (2.5 MG/3ML) 0.083% IN NEBU
2.5000 mg | INHALATION_SOLUTION | RESPIRATORY_TRACT | Status: DC | PRN
  Administered 2015-02-25 – 2015-03-01 (×4): 2.5 mg via RESPIRATORY_TRACT
  Filled 2015-02-25 (×4): qty 3

## 2015-02-25 MED ORDER — SODIUM CHLORIDE 0.9 % IV SOLN: 250.0000 mL | INTRAVENOUS | Status: DC | PRN

## 2015-02-25 MED ORDER — CETYLPYRIDINIUM CHLORIDE 0.05 % MT LIQD
7.0000 mL | Freq: Two times a day (BID) | OROMUCOSAL | Status: DC
Start: 2015-02-25 — End: 2015-02-25
  Administered 2015-02-25: 7 mL via OROMUCOSAL

## 2015-02-25 MED ORDER — ASPIRIN EC 81 MG PO TBEC
81.0000 mg | DELAYED_RELEASE_TABLET | Freq: Every day | ORAL | Status: DC
  Administered 2015-02-25: 81 mg via ORAL
  Filled 2015-02-25: qty 1

## 2015-02-25 MED ORDER — TRAZODONE HCL 50 MG PO TABS
50.0000 mg | ORAL_TABLET | Freq: Every day | ORAL | Status: DC
  Administered 2015-02-25 – 2015-03-02 (×6): 50 mg via ORAL
  Filled 2015-02-25 (×6): qty 1

## 2015-02-25 MED ORDER — SODIUM CHLORIDE 0.9 % IJ SOLN
3.0000 mL | Freq: Two times a day (BID) | INTRAMUSCULAR | Status: DC
  Administered 2015-02-25 – 2015-02-27 (×3): 3 mL via INTRAVENOUS

## 2015-02-25 MED ORDER — RIVAROXABAN 20 MG PO TABS
20.0000 mg | ORAL_TABLET | Freq: Every day | ORAL | Status: DC
  Administered 2015-02-25: 20 mg via ORAL
  Filled 2015-02-25: qty 1

## 2015-02-25 MED ORDER — ROCURONIUM BROMIDE 50 MG/5ML IV SOLN
75.0000 mg | Freq: Once | INTRAVENOUS | Status: AC
  Administered 2015-02-25: 75 mg via INTRAVENOUS

## 2015-02-25 MED ORDER — FUROSEMIDE 10 MG/ML IJ SOLN
80.0000 mg | Freq: Two times a day (BID) | INTRAMUSCULAR | Status: DC
  Administered 2015-02-25: 80 mg via INTRAVENOUS
  Filled 2015-02-25: qty 8

## 2015-02-25 MED ORDER — GABAPENTIN 600 MG PO TABS
600.0000 mg | ORAL_TABLET | Freq: Two times a day (BID) | ORAL | Status: DC
  Administered 2015-02-25 – 2015-03-03 (×13): 600 mg via ORAL
  Filled 2015-02-25 (×14): qty 1

## 2015-02-25 MED ORDER — ACETAMINOPHEN 325 MG PO TABS
650.0000 mg | ORAL_TABLET | ORAL | Status: DC | PRN
  Administered 2015-02-25 – 2015-03-03 (×2): 650 mg via ORAL
  Filled 2015-02-25 (×2): qty 2

## 2015-02-25 MED ORDER — ALBUTEROL SULFATE HFA 108 (90 BASE) MCG/ACT IN AERS: 2.0000 | INHALATION_SPRAY | RESPIRATORY_TRACT | Status: DC | PRN

## 2015-02-25 MED ORDER — FUROSEMIDE 10 MG/ML IJ SOLN
80.0000 mg | Freq: Three times a day (TID) | INTRAMUSCULAR | Status: DC
  Administered 2015-02-25 – 2015-02-28 (×10): 80 mg via INTRAVENOUS
  Filled 2015-02-25 (×10): qty 8

## 2015-02-25 MED ORDER — FUROSEMIDE 10 MG/ML IJ SOLN: 60.0000 mg | Freq: Two times a day (BID) | INTRAMUSCULAR | Status: DC

## 2015-02-25 MED ORDER — SODIUM CHLORIDE 0.9 % IV SOLN
25.0000 ug/h | INTRAVENOUS | Status: DC
  Administered 2015-02-25 – 2015-02-27 (×2): 50 ug/h via INTRAVENOUS
  Filled 2015-02-25 (×2): qty 50

## 2015-02-25 MED ORDER — FENTANYL CITRATE (PF) 100 MCG/2ML IJ SOLN
INTRAMUSCULAR | Status: AC
  Filled 2015-02-25: qty 2

## 2015-02-25 MED ORDER — CHLORHEXIDINE GLUCONATE 0.12 % MT SOLN: 15.0000 mL | Freq: Two times a day (BID) | OROMUCOSAL | Status: DC

## 2015-02-25 MED ORDER — SODIUM CHLORIDE 0.9 % IV SOLN
1.0000 mg/h | INTRAVENOUS | Status: DC
  Administered 2015-02-25 – 2015-02-27 (×3): 2 mg/h via INTRAVENOUS
  Filled 2015-02-25 (×4): qty 10

## 2015-02-25 MED ORDER — ONDANSETRON HCL 4 MG/2ML IJ SOLN: 4.0000 mg | Freq: Four times a day (QID) | INTRAMUSCULAR | Status: DC | PRN

## 2015-02-25 MED ORDER — MIDAZOLAM HCL 2 MG/2ML IJ SOLN
INTRAMUSCULAR | Status: AC
  Filled 2015-02-25: qty 2

## 2015-02-25 MED ORDER — ANTISEPTIC ORAL RINSE SOLUTION (CORINZ)
7.0000 mL | OROMUCOSAL | Status: DC
  Administered 2015-02-25 – 2015-03-01 (×39): 7 mL via OROMUCOSAL

## 2015-02-25 MED ORDER — POTASSIUM CHLORIDE CRYS ER 20 MEQ PO TBCR
40.0000 meq | EXTENDED_RELEASE_TABLET | Freq: Every day | ORAL | Status: AC
  Administered 2015-02-25: 40 meq via ORAL
  Filled 2015-02-25: qty 2

## 2015-02-25 NOTE — Progress Notes (Signed)
[facility] PROGRESS NOTE  Dean Bowers [id number] DOB: [birth year] DOA: 02/24/2015 PCP: [name], MD Brief History: 45 year old male with PMH of COPD, PE on Xarelto, OSA/OHS, morbid Obesity, h/o diastolic heart failure, right BKA, hypoventilation syndrome.  hypertension, presents with severe sob and leg edema. He was found to be hypoxic and hypercarbic in ED and admitted to step down. Pulmonary consulted and recommendations given. Bipap ordered, but he refused to wear it and he has progressively been more lethargic.    Assessment/Plan: Acute hypoxic and hypercarbic respiratory failure: probably from acute on chronic diastolic heart failure from non compliant to diet and medications.  Admitted to step down, started him on aggressive diuresis with IV LASIX 80 mg BID.  Strict in and out, daily weights, serial enzymes, and EKG this am shows sinus tachycardia.  Echocardiogram ordered and pending.  Cardiology consulted for recommendations.  Would resume BIPAP, ( HE has not worn bipap all morning today)  and repeat CXR this am shows stable vascular congestion.  He has diuresed about 2 liters since admission.  ABG and plan to transfer teh pt to ICU. Discussed with Dr Titus Mould.   Pulmonary embolism: Currently on xarelto, suspect non compliant to meds.  ? PE.   Type 2 Diabetes mellitus: CBG (last 3)  No results for input(s): GLUCAP in the last 72 hours.  hgba1c ordered .    Hypertension:  Optimal.    Hypokalemia: repleted as needed.    Leukocytosis; Unclear etiology.  He presented with no fever , non toxic looking. Would not start him on any antibiotics now.  UA is negative.  Monitor.      Code Status: full code.  Family Communication: wife at bedside Disposition Plan: transfer to ICU.    Consultants:  Cardiology   PCCM.  Procedures: None  Antibiotics:  none  HPI/Subjective: Denies any new complaints.   Objective: Filed Vitals:    02/25/15 0543 02/25/15 0736  BP: 126/78 135/77  Pulse:  95  Temp: 98.4 F (36.9 C)   Resp:      Intake/Output Summary (Last 24 hours) at 02/25/15 1001 Last data filed at 02/25/15 0836  Gross per 24 hour  Intake    340 ml  Output   2050 ml  Net  -1710 ml   Filed Weights   02/25/15 0645  Weight: 163.34 kg (360 lb 1.6 oz)    Exam:   General:  Sleepy, lethargic, not  In distress  Cardiovascular: s1s2  Respiratory: diminished at bases, scattered rales, no wheezing  Heard.   Abdomen: soft NT nd bs+  Musculoskeletal: right BKA, left lower extremity edema.   Data Reviewed: Basic Metabolic Panel:  Recent Labs Lab 02/24/15 1525 02/25/15 0302  NA 140 141  K 3.4* 3.6  CL 92* 92*  CO2 38* 36*  GLUCOSE 182* 140*  BUN 18 15  CREATININE 1.18 0.89  CALCIUM 9.6 8.9   Liver Function Tests: No results for input(s): AST, ALT, ALKPHOS, BILITOT, PROT, ALBUMIN in the last 168 hours. No results for input(s): LIPASE, AMYLASE in the last 168 hours. No results for input(s): AMMONIA in the last 168 hours. CBC:  Recent Labs Lab 02/24/15 1525  WBC 13.9*  HGB 13.1  HCT 45.2  MCV 78.1  PLT 211   Cardiac Enzymes:  Recent Labs Lab 02/25/15 0345  TROPONINI 0.06*   BNP (last 3 results)  Recent Labs  04/28/14 1919 05/03/14 1813 02/24/15 1525  BNP 371.5* 451.1* 107.4*  ProBNP (last 3 results) No results for input(s): PROBNP in the last 8760 hours.  CBG: No results for input(s): GLUCAP in the last 168 hours.  Recent Results (from the past 240 hour(s))  MRSA PCR Screening     Status: None   Collection Time: 02/25/15  5:11 AM  Result Value Ref Range Status   MRSA by PCR NEGATIVE NEGATIVE Final    Comment:        The GeneXpert MRSA Assay (FDA approved for NASAL specimens only), is one component of a comprehensive MRSA colonization surveillance program. It is not intended to diagnose MRSA infection nor to guide or monitor treatment for MRSA infections.       Studies: Dg Chest 2 View  02/24/2015  CLINICAL DATA:  Shortness of breath. EXAM: CHEST  2 VIEW COMPARISON:  April 15, 2014 FINDINGS: Mild cardiomegaly is identified. The hila and mediastinum are unchanged. No pneumothorax. No pulmonary nodules or masses. Mild atelectasis in left lung base. Increased interstitial markings consistent with pulmonary venous congestion without overt edema. Mild streaky opacity over the heart on the lateral view may simply represent atelectasis. IMPRESSION: Mild pulmonary venous congestion. No acute abnormalities identified. Electronically Signed   By: Dorise Bullion III M.D   On: 02/24/2015 16:12   Dg Chest Port 1 View  02/25/2015  CLINICAL DATA:  Short of breath EXAM: PORTABLE CHEST 1 VIEW COMPARISON:  300 hours FINDINGS: Moderate cardiomegaly. Mild vascular congestion. No pulmonary edema. No pneumothorax. IMPRESSION: Cardiomegaly and vascular congestion are stable. Electronically Signed   By: Marybelle Killings M.D.   On: 02/25/2015 07:40   Dg Chest Port 1 View  02/25/2015  CLINICAL DATA:  Acute onset of respiratory failure and hypoxia. Severe shortness of breath. Initial encounter. EXAM: PORTABLE CHEST 1 VIEW COMPARISON:  Chest radiograph performed 02/24/2015 FINDINGS: The lungs are well-aerated. Vascular congestion is noted. Bibasilar airspace opacities may reflect mild interstitial edema or possibly pneumonia. There is no evidence of pleural effusion or pneumothorax. The cardiomediastinal silhouette is mildly enlarged. No acute osseous abnormalities are seen. IMPRESSION: Vascular congestion and mild cardiomegaly. Bibasilar airspace opacities may reflect mild interstitial edema or possibly pneumonia. Electronically Signed   By: Garald Balding M.D.   On: 02/25/2015 03:22    Scheduled Meds: . antiseptic oral rinse  7 mL Mouth Rinse q12n4p  . aspirin EC  81 mg Oral Daily  . chlorhexidine  15 mL Mouth Rinse BID  . diltiazem  120 mg Oral Daily  . furosemide  80 mg  Intravenous BID  . gabapentin  600 mg Oral BID  . rivaroxaban  20 mg Oral Q supper  . sodium chloride  3 mL Intravenous Q12H  . traZODone  50 mg Oral QHS   Continuous Infusions:   Active Problems:   Morbid obesity ([place])   Hyperlipidemia associated with type 2 diabetes mellitus ([place])   Asthma, chronic   Polysubstance abuse   Essential hypertension   OSA (obstructive sleep apnea)   Anticoagulation adequate, on xarelto   Right heart failure (HCC)   Pulmonary embolus, left (HCC)   Acute on chronic diastolic (congestive) heart failure (HCC)   Acute respiratory failure with hypoxia and hypercarbia (HCC)   Respiratory failure ([place])   CHF exacerbation ([place])    Time spent: 45 minutes    [facility] Pager [phone] . If 7PM-7AM, please contact night-coverage at [email or web address], password [facility] 02/25/2015, 10:01 AM  LOS: 1 day

## 2015-02-25 NOTE — ED Notes (Signed)
Pt sitting on side of bed in position of comfort due to not being able to lay in bed. Pt on pulse ox, bp cuff, and heart monitor.

## 2015-02-25 NOTE — Procedures (Signed)
OGT Placement  Under direct visualization, placed OGT and verified with auscultation.  Rush Farmer, M.D. [facility] Pulmonary/Critical Care Medicine. Pager: [phone]. After hours pager: [phone].

## 2015-02-25 NOTE — Progress Notes (Signed)
eLink Physician-Brief Progress Note Patient Name: MIHAEL GLYNN DOB: 01-18-1971 MRN: [id number]   Date of Service  02/25/2015  HPI/Events of Note  Notified of need for GI prophylaxis.   eICU Interventions  Will order Protonix IV.      Intervention Category Intermediate Interventions: Best-practice therapies (e.g. DVT, beta blocker, etc.)  Sommer,Steven Eugene 02/25/2015, 7:00 PM
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
## 2015-02-25 NOTE — Procedures (Signed)
Central Venous Catheter Insertion Procedure Note Dean Bowers [id number] August 16, 1970  Procedure: Insertion of Central Venous Catheter Indications: Assessment of intravascular volume, Drug and/or fluid administration and Frequent blood sampling  Procedure Details Consent: Risks of procedure as well as the alternatives and risks of each were explained to the (patient/caregiver).  Consent for procedure obtained. Time Out: Verified patient identification, verified procedure, site/side was marked, verified correct patient position, special equipment/implants available, medications/allergies/relevent history reviewed, required imaging and test results available.  Performed  Maximum sterile technique was used including antiseptics, cap, gloves, gown, hand hygiene, mask and sheet. Skin prep: Chlorhexidine; local anesthetic administered A antimicrobial bonded/coated triple lumen catheter was placed in the left internal jugular vein using the Seldinger technique.  Evaluation Blood flow good Complications: No apparent complications Patient did tolerate procedure well. Chest X-ray ordered to verify placement.  CXR: pending.  U/S used in placement.  Bowers,Dean 02/25/2015, 3:14 PM
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
## 2015-02-25 NOTE — Progress Notes (Signed)
Pt having a very hard time holding sats . Oxygen sats between 76% on 5l and 88%. Pt is short of breath and he is hard to arouse. Dr. Karleen Hampshire notified and came to room to assess patient and he was still hard to arouse. She stated for him to be transferred to ICU. Pts wife at bedside and updated with MD. Dr. Levonne Spiller came to see patient and agreed with transfer. ABG results given to Dr. Karleen Hampshire and Dr. Levonne Spiller.

## 2015-02-25 NOTE — Progress Notes (Signed)
Pt transported to 2H08 with respiratory on bipap and monitors. Pt still lethargic at times but when I got him to the room, he did open his eyes but noddled right back off. Report given to [facility] on [facility]

## 2015-02-25 NOTE — ED Notes (Signed)
EMT attempted to place condom cath on patient before placing foley; condom cath would not stay in place to catch urine; foley will be place per Md order

## 2015-02-25 NOTE — Progress Notes (Signed)
Pt placed on NIV/PC per MD order, pt tolerating well, RT will monitor
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
## 2015-02-25 NOTE — Progress Notes (Signed)
Arterial gas drawn while patient was wearing oxygen set at 3lpm. Results of ABG given to nurse and Dr.Campos.

## 2015-02-25 NOTE — ED Notes (Signed)
Portable X-Ray bedside.

## 2015-02-25 NOTE — ED Notes (Signed)
Pt placed back on Bipap 

## 2015-02-25 NOTE — Progress Notes (Signed)
Patient transported from 3W06 to room 2H08, on bipap, without any complications.  RT will continue to monitor.

## 2015-02-25 NOTE — ED Notes (Signed)
Pt placed on nasal canula by respiratory due to pt taking off bipap machine while transferring rooms.

## 2015-02-25 NOTE — Progress Notes (Addendum)
UR Completed Demani Weyrauch Graves-Bigelow, RN,BSN [phone]  

## 2015-02-25 NOTE — ED Notes (Signed)
Admitting at bedside 

## 2015-02-25 NOTE — Procedures (Signed)
Intubation Procedure Note JAVARRI DENSFORD [id number] [birth year]  Procedure: Intubation Indications: Respiratory insufficiency  Procedure Details Consent: Risks of procedure as well as the alternatives and risks of each were explained to the (patient/caregiver).  Consent for procedure obtained. Time Out: Verified patient identification, verified procedure, site/side was marked, verified correct patient position, special equipment/implants available, medications/allergies/relevent history reviewed, required imaging and test results available.  Performed  Maximum sterile technique was used including gloves, hand hygiene and mask.  MAC    Evaluation Hemodynamic Status: BP stable throughout; O2 sats: stable throughout Patient's Current Condition: stable Complications: No apparent complications Patient did tolerate procedure well. Chest X-ray ordered to verify placement.  CXR: pending.   Jennet Maduro 02/25/2015

## 2015-02-25 NOTE — Progress Notes (Signed)
Called by RT, patient is decompensating.  Very little air movement.  Patient examined, little air movement and poor mental status.  ABG ordered.  Called back with ABG results, worsening acidosis.  CO2 >100.  I spoke with wife, patient apparently did this last year and was intubated as well.  Clarified code status, full code.  Will proceed with intubation.  All charts reviewed, CXR reviewed.  Appears to decompensated heart failure and acute on chronic hypercarbic respiratory failure with acute encephalopathy.  Will intubate, place vent orders and sedation orders.  Repeat ABG and CXR ordered.  The patient is critically ill with multiple organ systems failure and requires high complexity decision making for assessment and support, frequent evaluation and titration of therapies, application of advanced monitoring technologies and extensive interpretation of multiple databases.   Critical Care Time devoted to patient care services described in this note is  60  Minutes. This time reflects time of care of this signee Dr Jennet Maduro. This critical care time does not reflect procedure time, or teaching time or supervisory time of PA/NP/Med student/Med Resident etc but could involve care discussion time.  Rush Farmer, M.D. [facility] Pulmonary/Critical Care Medicine. Pager: [phone]. After hours pager: [phone].

## 2015-02-25 NOTE — Progress Notes (Signed)
Vent changes per MD request based on ABG results.

## 2015-02-26 ENCOUNTER — Inpatient Hospital Stay (HOSPITAL_COMMUNITY): Payer: 59

## 2015-02-26 LAB — BASIC METABOLIC PANEL
Anion gap: 10 (ref 5–15)
BUN: 11 mg/dL (ref 6–20)
CHLORIDE: 92 mmol/L — AB (ref 101–111)
CO2: 42 mmol/L — AB (ref 22–32)
CREATININE: 0.94 mg/dL (ref 0.61–1.24)
Calcium: 8.8 mg/dL — ABNORMAL LOW (ref 8.9–10.3)
GFR calc Af Amer: >60 mL/min (ref >60)
GFR calc non Af Amer: >60 mL/min (ref >60)
Glucose, Bld: 101 mg/dL — ABNORMAL HIGH (ref 65–99)
POTASSIUM: 3.3 mmol/L — AB (ref 3.5–5.1)
SODIUM: 144 mmol/L (ref 135–145)

## 2015-02-26 LAB — HEPARIN LEVEL (UNFRACTIONATED)
HEPARIN UNFRACTIONATED: 0.84 [IU]/mL — AB (ref 0.30–0.70)
Heparin Unfractionated: 1.26 IU/mL — ABNORMAL HIGH (ref 0.30–0.70)

## 2015-02-26 LAB — BLOOD GAS, ARTERIAL
Acid-Base Excess: 18.2 mmol/L — ABNORMAL HIGH (ref 0.0–2.0)
BICARBONATE: 43.7 meq/L — AB (ref 20.0–24.0)
Drawn by: 252031
FIO2: 0.6
LHR: 16 {breaths}/min
O2 SAT: 95.4 %
PATIENT TEMPERATURE: 98.6
PCO2 ART: 66.6 mmHg — AB (ref 35.0–45.0)
PEEP/CPAP: 10 cmH2O
PH ART: 7.433 (ref 7.350–7.450)
PO2 ART: 76.5 mmHg — AB (ref 80.0–100.0)
TCO2: 45.8 mmol/L (ref 0–100)
VT: 560 mL

## 2015-02-26 LAB — GLUCOSE, CAPILLARY
GLUCOSE-CAPILLARY: 113 mg/dL — AB (ref 65–99)
GLUCOSE-CAPILLARY: 107 mg/dL — AB (ref 65–99)
GLUCOSE-CAPILLARY: 107 mg/dL — AB (ref 65–99)
Glucose-Capillary: 88 mg/dL (ref 65–99)

## 2015-02-26 LAB — CBC WITH DIFFERENTIAL/PLATELET
Basophils Absolute: 0 10*3/uL (ref 0.0–0.1)
Basophils Relative: 0 %
EOS ABS: 0.1 10*3/uL (ref 0.0–0.7)
EOS PCT: 1 %
HCT: 44.5 % (ref 39.0–52.0)
HEMOGLOBIN: 12.4 g/dL — AB (ref 13.0–17.0)
LYMPHS ABS: 1.6 10*3/uL (ref 0.7–4.0)
LYMPHS PCT: 15 %
MCH: 22.3 pg — AB (ref 26.0–34.0)
MCHC: 27.9 g/dL — AB (ref 30.0–36.0)
MCV: 80 fL (ref 78.0–100.0)
MONOS PCT: 6 %
Monocytes Absolute: 0.7 10*3/uL (ref 0.1–1.0)
NEUTROS PCT: 78 %
Neutro Abs: 8.3 10*3/uL — ABNORMAL HIGH (ref 1.7–7.7)
Platelets: 178 10*3/uL (ref 150–400)
RBC: 5.56 MIL/uL (ref 4.22–5.81)
RDW: 17.7 % — ABNORMAL HIGH (ref 11.5–15.5)
WBC: 10.8 10*3/uL — AB (ref 4.0–10.5)

## 2015-02-26 LAB — HEMOGLOBIN A1C
Hgb A1c MFr Bld: 6.5 % — ABNORMAL HIGH (ref 4.8–5.6)
Mean Plasma Glucose: 140 mg/dL

## 2015-02-26 LAB — APTT
aPTT: 40 seconds — ABNORMAL HIGH (ref 24–37)
aPTT: 64 seconds — ABNORMAL HIGH (ref 24–37)

## 2015-02-26 LAB — PHOSPHORUS: Phosphorus: 1.8 mg/dL — ABNORMAL LOW (ref 2.5–4.6)

## 2015-02-26 LAB — MAGNESIUM: Magnesium: 1.9 mg/dL (ref 1.7–2.4)

## 2015-02-26 MED ORDER — MAGNESIUM SULFATE 2 GM/50ML IV SOLN
2.0000 g | Freq: Once | INTRAVENOUS | Status: AC
  Administered 2015-02-26: 2 g via INTRAVENOUS
  Filled 2015-02-26: qty 50

## 2015-02-26 MED ORDER — POTASSIUM PHOSPHATES 15 MMOLE/5ML IV SOLN
24.0000 mmol | Freq: Once | INTRAVENOUS | Status: AC
  Administered 2015-02-26: 24 mmol via INTRAVENOUS
  Filled 2015-02-26: qty 8

## 2015-02-26 MED ORDER — HEPARIN (PORCINE) IN NACL 100-0.45 UNIT/ML-% IJ SOLN
2450.0000 [IU]/h | INTRAMUSCULAR | Status: DC
  Administered 2015-02-26: 1900 [IU]/h via INTRAVENOUS
  Administered 2015-02-27: 2100 [IU]/h via INTRAVENOUS
  Administered 2015-02-27: 2250 [IU]/h via INTRAVENOUS
  Administered 2015-02-28: 2450 [IU]/h via INTRAVENOUS
  Filled 2015-02-26 (×4): qty 250

## 2015-02-26 MED ORDER — ASPIRIN 81 MG PO CHEW
81.0000 mg | CHEWABLE_TABLET | Freq: Every day | ORAL | Status: DC
  Administered 2015-02-26 – 2015-02-28 (×3): 81 mg
  Filled 2015-02-26 (×2): qty 1

## 2015-02-26 MED ORDER — POTASSIUM CHLORIDE 10 MEQ/50ML IV SOLN
10.0000 meq | INTRAVENOUS | Status: AC
  Administered 2015-02-26 (×2): 10 meq via INTRAVENOUS
  Filled 2015-02-26 (×2): qty 50

## 2015-02-26 MED ORDER — POTASSIUM CHLORIDE 20 MEQ/15ML (10%) PO SOLN
40.0000 meq | Freq: Every day | ORAL | Status: DC
  Administered 2015-02-26 – 2015-02-28 (×3): 40 meq
  Filled 2015-02-26 (×3): qty 30

## 2015-02-26 MED ORDER — HEPARIN BOLUS VIA INFUSION
5000.0000 [IU] | Freq: Once | INTRAVENOUS | Status: AC
  Administered 2015-02-26: 5000 [IU] via INTRAVENOUS
  Filled 2015-02-26: qty 5000

## 2015-02-26 MED ORDER — SODIUM PHOSPHATE 3 MMOLE/ML IV SOLN
20.0000 mmol | Freq: Once | INTRAVENOUS | Status: AC
  Administered 2015-02-26: 20 mmol via INTRAVENOUS
  Filled 2015-02-26: qty 6.67

## 2015-02-26 MED ORDER — PERFLUTREN LIPID MICROSPHERE
INTRAVENOUS | Status: AC
  Administered 2015-02-26: 2 mL via INTRAVENOUS
  Filled 2015-02-26: qty 10

## 2015-02-26 MED ORDER — PRO-STAT SUGAR FREE PO LIQD
30.0000 mL | Freq: Three times a day (TID) | ORAL | Status: DC
  Administered 2015-02-26 – 2015-02-28 (×8): 30 mL
  Filled 2015-02-26 (×8): qty 30

## 2015-02-26 MED ORDER — ASPIRIN 81 MG PO CHEW
CHEWABLE_TABLET | ORAL | Status: AC
  Administered 2015-02-26: 81 mg
  Filled 2015-02-26: qty 1

## 2015-02-26 MED ORDER — PERFLUTREN LIPID MICROSPHERE
1.0000 mL | INTRAVENOUS | Status: AC | PRN
  Administered 2015-02-26: 2 mL via INTRAVENOUS
  Filled 2015-02-26: qty 10

## 2015-02-26 MED ORDER — VITAL HIGH PROTEIN PO LIQD
1000.0000 mL | ORAL | Status: DC
  Administered 2015-02-26: 1000 mL
  Administered 2015-02-26 – 2015-02-27 (×2)
  Administered 2015-02-27 – 2015-02-28 (×2): 1000 mL

## 2015-02-26 NOTE — Progress Notes (Signed)
ANTICOAGULATION CONSULT NOTE - Follow Up Consult  Pharmacy Consult for Heparin Indication: Hx PE  Allergies  Allergen Reactions  . Lyrica [Pregabalin] Other (See Comments)    Hallucinations     Patient Measurements: Height: 5\' 9"  (175.3 cm) Weight: (!) 416 lb (188.696 kg) IBW/kg (Calculated) : 70.7 Heparin Dosing Weight: 118 kg  Vital Signs: Temp: 98.7 F (37.1 C) (01/21 2000) Temp Source: Oral (01/21 2000) BP: 132/81 mmHg (01/21 2000) Pulse Rate: 73 (01/21 2000)  Labs:  Recent Labs  02/24/15 1525 02/25/15 0302 02/25/15 0345 02/25/15 1013 02/26/15 0454 02/26/15 1421 02/26/15 1953 02/26/15 1954  HGB 13.1  --   --   --  12.4*  --   --   --   HCT 45.2  --   --   --  44.5  --   --   --   PLT 211  --   --   --  178  --   --   --   APTT  --   --   --   --   --  40*  --  64*  HEPARINUNFRC  --   --   --   --   --  1.26* 0.84*  --   CREATININE 1.18 0.89  --   --  0.94  --   --   --   TROPONINI  --   --  0.06* 0.09*  --   --   --   --     Estimated Creatinine Clearance: 165.5 mL/min (by C-G formula based on Cr of 0.94).   Medications:  Heparin @ 1900 units/hr (19 ml/hr)   Assessment: 37 YOM who presented on 1/20 with worsening dyspnea, LLE edema, and fluid retention. The required intubation on admit. PTA, the patient was on Xarelto for hx PE which was transitioned to Heparin IV earlier today due to lack of po intake and expected reduced Xarelto absorption while intubated.  An aPTT level this evening resulted as slightly SUBtherapeutic (aPTT 64, goal of 66-102 seconds). HL remains falsely elevated at 0.84 in the setting of recent Xarelto administration. No bleeding noted. Will increase heparin this evening.   Goal of Therapy:  Heparin level 0.3-0.7 units/ml aPTT 66-102 seconds Monitor platelets by anticoagulation protocol: Yes   Plan:  1. Increase heparin to 2100 units/hr (21 ml/hr) 2. Will continue to monitor for any signs/symptoms of bleeding and will follow up  with heparin level in 6 hours   Alycia Rossetti, PharmD, BCPS Clinical Pharmacist Pager: [phone] 02/26/2015 8:27 PM

## 2015-02-26 NOTE — Progress Notes (Signed)
Name: CONROY SEITZ MRN: [id number] DOB: [birth year]    LOS: 2    PULMONARY / CRITICAL CARE MEDICINE  HPI:  Mr. Ryerson Stukel is a 45 year old male with PMH of COPD, PE on Xarelto, dCHF, OSA/OHS, morbid Obesity, s/p R BKA, and chronic pain who presents with SOB, lower extremity edema, and chest tightness. He states he has been taking his medications as prescribed but has noticed weight gain, lower extremity swelling, and SOB with minimal activity. He wears O2 at home but has also noticed decreased oxygen saturation at home and increased fatigue. He has poor compliance with diet and exercise. In February 2016, patient required intubation after OR trip for stump revision due to hypercarbic respiratory failure. In late March 2016, patient was admitted for acute on chronic respiratory failure due to acute on chronic diastolic heart failure. He was heavily diuresed with IV Lasix (~42L) with discharge weight of 351 lbs (down ~90 lbs during hospital stay). His current weight is 410 lbs (375 on 01/04/15). He takes torsemide at home.  In the ED, patient was found to be hypoxic in the 70s on room air and tachycardic to 130s. CXR shows pulmonary vascular congestion. He was started on BiPAP; but eventually required intubation after further decompensation in the ICU  Vital Signs: Temp:  [98 F (36.7 C)-99.8 F (37.7 C)] 99.8 F (37.7 C) (01/21 0716) Pulse Rate:  [68-116] 83 (01/21 0937) Resp:  [12-26] 19 (01/21 0937) BP: (100-160)/(57-98) 100/57 mmHg (01/21 0716) SpO2:  [91 %-100 %] 93 % (01/21 0937) FiO2 (%):  [50 %-100 %] 50 % (01/21 0937) Weight:  [416 lb (188.696 kg)] 416 lb (188.696 kg) (01/21 0200) CVP:  [15 mmHg] 15 mmHg   Intake/Output Summary (Last 24 hours) at 02/26/15 1053 Last data filed at 02/26/15 0900  Gross per 24 hour  Intake    152 ml  Output   5975 ml  Net  -5823 ml   Physical Examination: General:  Morbidly obese male laying in bed, no distress on vent  Neuro:  AAOx3,  moves all ext  HEENT: [place]/AT, orally intubated  Cardiovascular: Distant heart sounds, RRR, no appreciable m/r/g Lungs: distant breath sounds, mild rales, no respiratory distress Abdomen: large abdomen, non-tender, +BS Musculoskeletal:  Right BKA, LLE +2 pitting edema Skin: warm  CBC Recent Labs     02/24/15  1525  02/26/15  0454  WBC  13.9*  10.8*  HGB  13.1  12.4*  HCT  45.2  44.5  PLT  211  178    Coag's No results for input(s): APTT, INR in the last 72 hours.  BMET Recent Labs     02/24/15  1525  02/25/15  0302  02/26/15  0454  NA  140  141  144  K  3.4*  3.6  3.3*  CL  92*  92*  92*  CO2  38*  36*  42*  BUN  18  15  11   CREATININE  1.18  0.89  0.94  GLUCOSE  182*  140*  101*    Electrolytes Recent Labs     02/24/15  1525  02/25/15  0302  02/26/15  0454  CALCIUM  9.6  8.9  8.8*  MG   --    --   1.9  PHOS   --    --   1.8*    Sepsis Markers No results for input(s): PROCALCITON, O2SATVEN in the last 72 hours.  Invalid input(s): LACTICACIDVEN  ABG Recent  Labs     02/25/15  1056  02/25/15  1720  02/26/15  0305  PHART  7.373  7.342*  7.433  PCO2ART  74.6*  90.8*  66.6*  PO2ART  45.7*  153.0*  76.5*    Liver Enzymes No results for input(s): AST, ALT, ALKPHOS, BILITOT, ALBUMIN in the last 72 hours.  Cardiac Enzymes Recent Labs     02/25/15  0345  02/25/15  1013  TROPONINI  0.06*  0.09*    Glucose Recent Labs     02/25/15  1341  02/25/15  1824  02/25/15  2118  02/26/15  0714  GLUCAP  123*  117*  101*  113*    Imaging Dg Chest 2 View  02/24/2015  CLINICAL DATA:  Shortness of breath. EXAM: CHEST  2 VIEW COMPARISON:  April 15, 2014 FINDINGS: Mild cardiomegaly is identified. The hila and mediastinum are unchanged. No pneumothorax. No pulmonary nodules or masses. Mild atelectasis in left lung base. Increased interstitial markings consistent with pulmonary venous congestion without overt edema. Mild streaky opacity over the heart on the  lateral view may simply represent atelectasis. IMPRESSION: Mild pulmonary venous congestion. No acute abnormalities identified. Electronically Signed   By: Dorise Bullion III M.D   On: 02/24/2015 16:12   Dg Abd 1 View  02/25/2015  CLINICAL DATA:  Recent gastric catheter placement EXAM: ABDOMEN - 1 VIEW COMPARISON:  None. FINDINGS: Gastric catheter extends into the distal aspect of the stomach. Postsurgical changes are seen. No free air is noted. IMPRESSION: Gastric catheter within the distal stomach. Electronically Signed   By: Inez Catalina M.D.   On: 02/25/2015 15:38   Dg Chest Port 1 View  02/26/2015  CLINICAL DATA:  Check endotracheal tube placement EXAM: PORTABLE CHEST - 1 VIEW COMPARISON:  02/25/2015 FINDINGS: Endotracheal tube is again noted 4.5 cm above the carina in satisfactory position. A nasogastric catheter is noted within the stomach. Left jugular central line again is seen in the proximal superior vena cava. The cardiac shadow remains enlarged. Mild vascular congestion is again seen. No focal confluent infiltrate is noted. IMPRESSION: Stable central vascular congestion. Tubes and lines stable in appearance. Electronically Signed   By: Inez Catalina M.D.   On: 02/26/2015 07:26   Dg Chest Port 1 View  02/25/2015  CLINICAL DATA:  Encounter for endotracheal tube placement. EXAM: PORTABLE CHEST 1 VIEW COMPARISON:  Same day. FINDINGS: Stable cardiomegaly. Nasogastric tube is seen entering stomach. Endotracheal tube is seen projected over tracheal air shadow with distal tip 4 cm above the carina. Stable mild central pulmonary vascular congestion is noted. No pneumothorax or significant pleural effusion is noted. Bony thorax is unremarkable. Interval placement of left internal jugular catheter with distal tip in expected position of SVC. IMPRESSION: Endotracheal and nasogastric tubes are in grossly good position. Interval placement of left internal jugular catheter with distal tip in expected position  of the SVC. No pneumothorax is noted. Electronically Signed   By: Marijo Conception, M.D.   On: 02/25/2015 15:38   Dg Chest Port 1 View  02/25/2015  CLINICAL DATA:  Short of breath EXAM: PORTABLE CHEST 1 VIEW COMPARISON:  300 hours FINDINGS: Moderate cardiomegaly. Mild vascular congestion. No pulmonary edema. No pneumothorax. IMPRESSION: Cardiomegaly and vascular congestion are stable. Electronically Signed   By: Marybelle Killings M.D.   On: 02/25/2015 07:40   Dg Chest Port 1 View  02/25/2015  CLINICAL DATA:  Acute onset of respiratory failure and hypoxia. Severe shortness of breath.  Initial encounter. EXAM: PORTABLE CHEST 1 VIEW COMPARISON:  Chest radiograph performed 02/24/2015 FINDINGS: The lungs are well-aerated. Vascular congestion is noted. Bibasilar airspace opacities may reflect mild interstitial edema or possibly pneumonia. There is no evidence of pleural effusion or pneumothorax. The cardiomediastinal silhouette is mildly enlarged. No acute osseous abnormalities are seen. IMPRESSION: Vascular congestion and mild cardiomegaly. Bibasilar airspace opacities may reflect mild interstitial edema or possibly pneumonia. Electronically Signed   By: Garald Balding M.D.   On: 02/25/2015 03:22  no sig change. Persistent infiltrates.     ASSESSMENT AND PLAN PULMONARY  A: Acute on Chronic hypercarbic and hypoxic respiratory failure due to decompensated OSA/OHS and diastolic HF w/ pulmonary edema Restrictive lung disease due to morbid obesity Doubt PE - he reports strict compliance with xarelto Tobacco use >o2 requirements improving  P:  Full vent support Aggressive diuresis as BP and SCr permit Tobacco cessation counseling CXR in AM PAD protocol   Cardiovascular  A: Acute on chronic dCHF - likely cause of patient's symptoms Hx PE on Xarelto, reports compliance, low suspicion for PE at this time HTN P: Continue aggressive diuresis as BP and SCr permit Correct electrolytes as indicated Change to  heparin per cards.   Renal A: Hypokalemia Hypophosphatemia  Volume Overload  P:  Cont lasix Replace K and PO4 Daily chemistries  Strict I&O  GI: Morbid Obesity P: Start tube feeds PPI  HEME A: Anemia of chronic disease P: Cont IV heparin  Trend CBC  Transfuse per ICU protocol   ID A:  No acute issues P: Trend wbc and fever curve   Neuro A: Acute hypercarbic Encephalopathy-->now resolved.  P: PAD protocol RASS goal -1 Supportive care  Endocrine: A: Hyperglycemia P: ssi   Summary: decomp diastolic HF/OHS/OSA w/ volume over load and pulmonary edema.  Looking a little better. Will cont current diuretic regimen. Hope to begin weaning efforts on 1/22. For today will cont lasix, replace K and ensure am chemistries ordered. Family updated.    Erick Colace ACNP-BC [place] Pager # [phone] OR # [phone] if no answer

## 2015-02-26 NOTE — Progress Notes (Signed)
  Echocardiogram 2D Echocardiogram with Definity has been performed.  Jennette Dubin 02/26/2015, 10:48 AM

## 2015-02-26 NOTE — Progress Notes (Signed)
SUBJECTIVE:  Diuresing. Still intubated.  OBJECTIVE:   Vitals:   Filed Vitals:   02/26/15 0400 02/26/15 0500 02/26/15 0600 02/26/15 0716  BP: 122/73 121/76 123/77 100/57  Pulse: 88 68 82 83  Temp: 98.5 F (36.9 C)   99.8 F (37.7 C)  TempSrc: Oral   Oral  Resp: 23 23  16   Height:      Weight:      SpO2: 92% 97% 95% 93%   I&O's:   Intake/Output Summary (Last 24 hours) at 02/26/15 [id number] Last data filed at 02/26/15 0900  Gross per 24 hour  Intake    152 ml  Output   6475 ml  Net  -6323 ml   TELEMETRY: Reviewed telemetry pt in NSR:     PHYSICAL EXAM General: Intubated, sedated Head:   Normal cephalic and atramatic  Lungs:  Coarse breath sounds bilaterally to auscultation. Heart:   HRRR S1 S2  No JVD.   Abdomen: obese Msk:  Intubated, sedated Extremities:   Right BKA, 1+ left leg edema  Neuro: Intubated, sedated Psych:  Intubated, sedated Skin: No rash   LABS: Basic Metabolic Panel:  Recent Labs  02/25/15 0302 02/26/15 0454  NA 141 144  K 3.6 3.3*  CL 92* 92*  CO2 36* 42*  GLUCOSE 140* 101*  BUN 15 11  CREATININE 0.89 0.94  CALCIUM 8.9 8.8*  MG  --  1.9  PHOS  --  1.8*   Liver Function Tests: No results for input(s): AST, ALT, ALKPHOS, BILITOT, PROT, ALBUMIN in the last 72 hours. No results for input(s): LIPASE, AMYLASE in the last 72 hours. CBC:  Recent Labs  02/24/15 1525 02/26/15 0454  WBC 13.9* 10.8*  NEUTROABS  --  8.3*  HGB 13.1 12.4*  HCT 45.2 44.5  MCV 78.1 80.0  PLT 211 178   Cardiac Enzymes:  Recent Labs  02/25/15 0345 02/25/15 1013  TROPONINI 0.06* 0.09*   BNP: Invalid input(s): POCBNP D-Dimer: No results for input(s): DDIMER in the last 72 hours. Hemoglobin A1C:  Recent Labs  02/25/15 1013  HGBA1C 6.5*   Fasting Lipid Panel: No results for input(s): CHOL, HDL, LDLCALC, TRIG, CHOLHDL, LDLDIRECT in the last 72 hours. Thyroid Function Tests: No results for input(s): TSH, T4TOTAL, T3FREE, THYROIDAB in the last 72  hours.  Invalid input(s): FREET3 Anemia Panel: No results for input(s): VITAMINB12, FOLATE, FERRITIN, TIBC, IRON, RETICCTPCT in the last 72 hours. Coag Panel:   Lab Results  Component Value Date   INR 1.25 04/28/2014   INR 1.20 04/14/2014   INR 1.16 03/26/2014    RADIOLOGY: Dg Chest 2 View  02/24/2015  CLINICAL DATA:  Shortness of breath. EXAM: CHEST  2 VIEW COMPARISON:  April 15, 2014 FINDINGS: Mild cardiomegaly is identified. The hila and mediastinum are unchanged. No pneumothorax. No pulmonary nodules or masses. Mild atelectasis in left lung base. Increased interstitial markings consistent with pulmonary venous congestion without overt edema. Mild streaky opacity over the heart on the lateral view may simply represent atelectasis. IMPRESSION: Mild pulmonary venous congestion. No acute abnormalities identified. Electronically Signed   By: Dorise Bullion III M.D   On: 02/24/2015 16:12   Dg Abd 1 View  02/25/2015  CLINICAL DATA:  Recent gastric catheter placement EXAM: ABDOMEN - 1 VIEW COMPARISON:  None. FINDINGS: Gastric catheter extends into the distal aspect of the stomach. Postsurgical changes are seen. No free air is noted. IMPRESSION: Gastric catheter within the distal stomach. Electronically Signed   By: Elta Guadeloupe  Lukens M.D.   On: 02/25/2015 15:38   Dg Chest Port 1 View  02/26/2015  CLINICAL DATA:  Check endotracheal tube placement EXAM: PORTABLE CHEST - 1 VIEW COMPARISON:  02/25/2015 FINDINGS: Endotracheal tube is again noted 4.5 cm above the carina in satisfactory position. A nasogastric catheter is noted within the stomach. Left jugular central line again is seen in the proximal superior vena cava. The cardiac shadow remains enlarged. Mild vascular congestion is again seen. No focal confluent infiltrate is noted. IMPRESSION: Stable central vascular congestion. Tubes and lines stable in appearance. Electronically Signed   By: Inez Catalina M.D.   On: 02/26/2015 07:26   Dg Chest Port 1  View  02/25/2015  CLINICAL DATA:  Encounter for endotracheal tube placement. EXAM: PORTABLE CHEST 1 VIEW COMPARISON:  Same day. FINDINGS: Stable cardiomegaly. Nasogastric tube is seen entering stomach. Endotracheal tube is seen projected over tracheal air shadow with distal tip 4 cm above the carina. Stable mild central pulmonary vascular congestion is noted. No pneumothorax or significant pleural effusion is noted. Bony thorax is unremarkable. Interval placement of left internal jugular catheter with distal tip in expected position of SVC. IMPRESSION: Endotracheal and nasogastric tubes are in grossly good position. Interval placement of left internal jugular catheter with distal tip in expected position of the SVC. No pneumothorax is noted. Electronically Signed   By: Marijo Conception, M.D.   On: 02/25/2015 15:38   Dg Chest Port 1 View  02/25/2015  CLINICAL DATA:  Short of breath EXAM: PORTABLE CHEST 1 VIEW COMPARISON:  300 hours FINDINGS: Moderate cardiomegaly. Mild vascular congestion. No pulmonary edema. No pneumothorax. IMPRESSION: Cardiomegaly and vascular congestion are stable. Electronically Signed   By: Marybelle Killings M.D.   On: 02/25/2015 07:40   Dg Chest Port 1 View  02/25/2015  CLINICAL DATA:  Acute onset of respiratory failure and hypoxia. Severe shortness of breath. Initial encounter. EXAM: PORTABLE CHEST 1 VIEW COMPARISON:  Chest radiograph performed 02/24/2015 FINDINGS: The lungs are well-aerated. Vascular congestion is noted. Bibasilar airspace opacities may reflect mild interstitial edema or possibly pneumonia. There is no evidence of pleural effusion or pneumothorax. The cardiomediastinal silhouette is mildly enlarged. No acute osseous abnormalities are seen. IMPRESSION: Vascular congestion and mild cardiomegaly. Bibasilar airspace opacities may reflect mild interstitial edema or possibly pneumonia. Electronically Signed   By: Garald Balding M.D.   On: 02/25/2015 03:22      ASSESSMENT:  Volume overload, respiratory failure, DVT.  PLAN:  COntinue diuresis as renal function tolerates.   Vent per CCM.  Switch Xarelto to IV heparin since Xarelto absorption will be affected by no PO intake of food.  TO be manaed by pharmacy.  Hypokalemia: likely due to diuresis.   Jettie Booze, MD  02/26/2015  9:23 AM

## 2015-02-26 NOTE — Progress Notes (Signed)
[facility] ADULT ICU REPLACEMENT PROTOCOL FOR AM LAB REPLACEMENT ONLY  The patient does apply for the [facility] Adult ICU Electrolyte Replacment Protocol based on the criteria listed below:   1. Is GFR >/= 40 ml/min? Yes.    Patient's GFR today is >60 2. Is urine output >/= 0.5 ml/kg/hr for the last 6 hours? Yes.   Patient's UOP is 0.5 ml/kg/hr 3. Is BUN < 60 mg/dL? Yes.    Patient's BUN today is 11 4. Abnormal electrolyte(s): K3.3 5. Ordered repletion with: per protocol 6. If a panic level lab has been reported, has the CCM MD in charge been notified? Yes.  .   Physician:  Patricia Nettle, MD  Vear Clock 02/26/2015 6:02 AM

## 2015-02-26 NOTE — Progress Notes (Addendum)
Initial Nutrition Assessment  DOCUMENTATION CODES:  Obesity class III    INTERVENTION:  Vital High Protein  @ 20 ml/hr via OGT and increase by 10 ml every 4 hours to goal rate of 40 ml/hr.   30 ml Prostat TID.    Tube feeding regimen provides 1260 kcal (100% of needs), 129 gr  of protein, and 802 ml water.    NUTRITION DIAGNOSIS:   Inadequate oral intake related to inability to eat as evidenced by NPO/Vent status   GOAL: meet estimated needs based on ASPEN /SCCM guidelines    MONITOR: TF tolerance, vent status, labs,  Weights, I/O's,  and diet advancement as medically appropriate    REASON FOR ASSESSMENT:   Ventilator and consult to manage tube feeding    ASSESSMENT: Pt has hx of COPD, chronic pain, lower extremity edema and acute on chronic heart failure. He has hx severe weight  Gain and dietary non-compliance. Tube feeds has been started for him. Will continue to follow closely may need to change TF to a more calorically dense formula (Vital 1.5) if fluid status doesn't improve.  Patient is currently intubated on ventilator support. MV:  8.2L/min Temp (24hrs), Avg:98.6 F (37 C), Min:98 F (36.7 C), Max:99.8 F (37.7 C)  Sedative Fentanyl  labs: potassium 3.3, phos. 1.8 (low), mag 1.9 WNL  Diet Order:  Diet NPO time specified  Skin:   intact  Last BM:   1/21 watery stool   Height:   Ht Readings from Last 1 Encounters:  02/25/15 5\' 9"  (1.753 m)    Weight:   Wt Readings from Last 1 Encounters:  02/26/15 416 lb (188.696 kg)    Adj. Ideal Body Weight:  71.4 kg  BMI:  Body mass index is 61.4 kg/(m^2). (skewed due to fluid status)  Estimated Nutritional Needs:   Kcal:  1285  Protein:  142 gr  Fluid:  >2 liters daily (normal needs)  EDUCATION NEEDS:     Colman Cater MS,RD,CSG,LDN Office: [phone] Pager: [phone]
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
## 2015-02-27 ENCOUNTER — Inpatient Hospital Stay (HOSPITAL_COMMUNITY): Payer: 59

## 2015-02-27 DIAGNOSIS — R600 Localized edema: Secondary | ICD-10-CM

## 2015-02-27 LAB — GLUCOSE, CAPILLARY
GLUCOSE-CAPILLARY: 111 mg/dL — AB (ref 65–99)
GLUCOSE-CAPILLARY: 132 mg/dL — AB (ref 65–99)
GLUCOSE-CAPILLARY: 131 mg/dL — AB (ref 65–99)
GLUCOSE-CAPILLARY: 118 mg/dL — AB (ref 65–99)
GLUCOSE-CAPILLARY: 116 mg/dL — AB (ref 65–99)
GLUCOSE-CAPILLARY: 112 mg/dL — AB (ref 65–99)
Glucose-Capillary: 123 mg/dL — ABNORMAL HIGH (ref 65–99)

## 2015-02-27 LAB — CBC
HEMATOCRIT: 46.2 % (ref 39.0–52.0)
Hemoglobin: 13.2 g/dL (ref 13.0–17.0)
MCH: 22.6 pg — AB (ref 26.0–34.0)
MCHC: 28.6 g/dL — ABNORMAL LOW (ref 30.0–36.0)
MCV: 79.2 fL (ref 78.0–100.0)
PLATELETS: 189 10*3/uL (ref 150–400)
RBC: 5.83 MIL/uL — AB (ref 4.22–5.81)
RDW: 18.2 % — ABNORMAL HIGH (ref 11.5–15.5)
WBC: 11.1 10*3/uL — AB (ref 4.0–10.5)

## 2015-02-27 LAB — APTT
aPTT: 65 seconds — ABNORMAL HIGH (ref 24–37)
aPTT: 60 seconds — ABNORMAL HIGH (ref 24–37)
aPTT: 69 seconds — ABNORMAL HIGH (ref 24–37)

## 2015-02-27 LAB — BASIC METABOLIC PANEL
Anion gap: 10 (ref 5–15)
BUN: 14 mg/dL (ref 6–20)
CALCIUM: 9.1 mg/dL (ref 8.9–10.3)
CHLORIDE: 94 mmol/L — AB (ref 101–111)
CO2: 43 mmol/L — AB (ref 22–32)
CREATININE: 0.97 mg/dL (ref 0.61–1.24)
GFR calc non Af Amer: >60 mL/min (ref >60)
GLUCOSE: 121 mg/dL — AB (ref 65–99)
Potassium: 3.1 mmol/L — ABNORMAL LOW (ref 3.5–5.1)
Sodium: 147 mmol/L — ABNORMAL HIGH (ref 135–145)

## 2015-02-27 LAB — PHOSPHORUS: Phosphorus: 4 mg/dL (ref 2.5–4.6)

## 2015-02-27 LAB — MAGNESIUM: Magnesium: 2.5 mg/dL — ABNORMAL HIGH (ref 1.7–2.4)

## 2015-02-27 MED ORDER — DILTIAZEM HCL 30 MG PO TABS: 30.0000 mg | ORAL_TABLET | Freq: Four times a day (QID) | ORAL | Status: DC

## 2015-02-27 MED ORDER — POTASSIUM CHLORIDE 10 MEQ/50ML IV SOLN
10.0000 meq | INTRAVENOUS | Status: AC
  Administered 2015-02-27 (×4): 10 meq via INTRAVENOUS
  Filled 2015-02-27 (×4): qty 50

## 2015-02-27 MED ORDER — DILTIAZEM HCL 60 MG PO TABS
30.0000 mg | ORAL_TABLET | Freq: Four times a day (QID) | ORAL | Status: DC
  Administered 2015-02-27 – 2015-03-03 (×16): 30 mg via ORAL
  Filled 2015-02-27 (×16): qty 1

## 2015-02-27 NOTE — Progress Notes (Signed)
[facility] for Heparin  Indication: Hx PE  Allergies  Allergen Reactions  . Lyrica [Pregabalin] Other (See Comments)    Hallucinations     Patient Measurements: Height: 5\' 9"  (175.3 cm) Weight: (!) 416 lb (188.696 kg) IBW/kg (Calculated) : 70.7  Heparin dosing weight: 118 kg  Vital Signs: Temp: 99.3 F (37.4 C) (01/22 2000) Temp Source: Oral (01/22 2000) BP: 127/80 mmHg (01/22 2200) Pulse Rate: 81 (01/22 2200)  Labs:  Recent Labs  02/25/15 0302 02/25/15 0345 02/25/15 1013 02/26/15 0454 02/26/15 1421 02/26/15 1953  02/27/15 0307 02/27/15 0309 02/27/15 1320 02/27/15 2215  HGB  --   --   --  12.4*  --   --   --  13.2  --   --   --   HCT  --   --   --  44.5  --   --   --  46.2  --   --   --   PLT  --   --   --  178  --   --   --  189  --   --   --   APTT  --   --   --   --  40*  --   < >  --  65* 60* 69*  HEPARINUNFRC  --   --   --   --  1.26* 0.84*  --   --   --   --   --   CREATININE 0.89  --   --  0.94  --   --   --  0.97  --   --   --   TROPONINI  --  0.06* 0.09*  --   --   --   --   --   --   --   --   < > = values in this interval not displayed.  Estimated Creatinine Clearance: 160.4 mL/min (by C-G formula based on Cr of 0.97).   Assessment: 45 y.o. male with h/o PE, Xarelto on hold, for heparin  Goal of Therapy:  Heparin level 0.3-0.7 units/ml aPTT 66-102 seconds Monitor platelets by anticoagulation protocol: Yes   Plan:  Continue Heparin at current rate  Follow-up am labs.  Phillis Knack, PharmD, BCPS   02/27/2015,10:56 PM

## 2015-02-27 NOTE — Progress Notes (Signed)
ANTICOAGULATION CONSULT NOTE - Follow Up Consult  Pharmacy Consult for Heparin  Indication: Hx PE  Allergies  Allergen Reactions  . Lyrica [Pregabalin] Other (See Comments)    Hallucinations     Patient Measurements: Height: 5\' 9"  (175.3 cm) Weight: (!) 416 lb (188.696 kg) IBW/kg (Calculated) : 70.7  Vital Signs: Temp: 98.3 F (36.8 C) (01/22 0400) Temp Source: Oral (01/22 0400) BP: 108/34 mmHg (01/22 0315) Pulse Rate: 70 (01/22 0315)  Labs:  Recent Labs  02/24/15 1525 02/25/15 0302 02/25/15 0345 02/25/15 1013 02/26/15 0454 02/26/15 1421 02/26/15 1953 02/26/15 1954 02/27/15 0307 02/27/15 0309  HGB 13.1  --   --   --  12.4*  --   --   --  13.2  --   HCT 45.2  --   --   --  44.5  --   --   --  46.2  --   PLT 211  --   --   --  178  --   --   --  189  --   APTT  --   --   --   --   --  40*  --  64*  --  65*  HEPARINUNFRC  --   --   --   --   --  1.26* 0.84*  --   --   --   CREATININE 1.18 0.89  --   --  0.94  --   --   --  0.97  --   TROPONINI  --   --  0.06* 0.09*  --   --   --   --   --   --     Estimated Creatinine Clearance: 160.4 mL/min (by C-G formula based on Cr of 0.97). Assessment: 68 YOM who presented on 1/20 with worsening dyspnea, LLE edema, and fluid retention. This required intubation on admit. PTA, the patient was on Xarelto for hx PE which was transitioned to Heparin IV earlier today due to lack of po intake and expected reduced Xarelto absorption while intubated.  An aPTT level this AM resulted as slightly SUBtherapeutic (aPTT 65, goal of 66-102 seconds). HL remains falsely elevated in the setting of recent Xarelto administration.   Goal of Therapy:  Heparin level 0.3-0.7 units/ml aPTT 66-102 seconds Monitor platelets by anticoagulation protocol: Yes   Plan:  -Increase heparin to 2250 units/hr -1300 aPTT  Narda Bonds 02/27/2015,4:29 AM

## 2015-02-27 NOTE — Progress Notes (Deleted)
[facility] ADULT ICU REPLACEMENT PROTOCOL FOR AM LAB REPLACEMENT ONLY  The patient does apply for the [facility] Adult ICU Electrolyte Replacment Protocol based on the criteria listed below:   1. Is GFR >/= 40 ml/min? Yes.    Patient's GFR today is >60 2. Is urine output >/= 0.5 ml/kg/hr for the last 6 hours? Yes.   Patient's UOP is 1.68 ml/kg/hr 3. Is BUN < 60 mg/dL? Yes.    Patient's BUN today is 14 4. Abnormal electrolyte  K 3.1 5. Ordered repletion with: per protocol 6. If a panic level lab has been reported, has the CCM MD in charge been notified? Yes.  .   Physician:  E Deterding  Christeen Douglas 02/27/2015 4:37 AM

## 2015-02-27 NOTE — Progress Notes (Signed)
SUBJECTIVE:  Diuresing. Still intubated.  OBJECTIVE:   Vitals:   Filed Vitals:   02/27/15 0800 02/27/15 0827 02/27/15 0900 02/27/15 1000  BP: 151/90 135/89 136/89 100/63  Pulse: 64 83 73 79  Temp:      TempSrc:      Resp:  20    Height:      Weight:      SpO2: 99% 96% 93% 90%   I&O's:    Intake/Output Summary (Last 24 hours) at 02/27/15 1100 Last data filed at 02/27/15 1000  Gross per 24 hour  Intake 2025.12 ml  Output   5750 ml  Net -3724.88 ml   TELEMETRY: Reviewed telemetry pt in NSR:   PHYSICAL EXAM General: Intubated, sedated Head:   Normal cephalic and atramatic  Lungs:  Coarse breath sounds bilaterally to auscultation. Heart:   HRRR S1 S2  No JVD.   Abdomen: obese Msk:  Intubated, sedated Extremities:   Right BKA, tr left leg edema  Neuro: Intubated, sedated Psych:  Intubated, sedated Skin: No rash   LABS: Basic Metabolic Panel:  Recent Labs  02/26/15 0454 02/27/15 0307  NA 144 147*  K 3.3* 3.1*  CL 92* 94*  CO2 42* 43*  GLUCOSE 101* 121*  BUN 11 14  CREATININE 0.94 0.97  CALCIUM 8.8* 9.1  MG 1.9 2.5*  PHOS 1.8* 4.0   Liver Function Tests: No results for input(s): AST, ALT, ALKPHOS, BILITOT, PROT, ALBUMIN in the last 72 hours. No results for input(s): LIPASE, AMYLASE in the last 72 hours. CBC:  Recent Labs  02/26/15 0454 02/27/15 0307  WBC 10.8* 11.1*  NEUTROABS 8.3*  --   HGB 12.4* 13.2  HCT 44.5 46.2  MCV 80.0 79.2  PLT 178 189   Cardiac Enzymes:  Recent Labs  02/25/15 0345 02/25/15 1013  TROPONINI 0.06* 0.09*   BNP: Invalid input(s): POCBNP D-Dimer: No results for input(s): DDIMER in the last 72 hours. Hemoglobin A1C:  Recent Labs  02/25/15 1013  HGBA1C 6.5*   Fasting Lipid Panel: No results for input(s): CHOL, HDL, LDLCALC, TRIG, CHOLHDL, LDLDIRECT in the last 72 hours. Thyroid Function Tests: No results for input(s): TSH, T4TOTAL, T3FREE, THYROIDAB in the last 72 hours.  Invalid input(s): FREET3 Anemia  Panel: No results for input(s): VITAMINB12, FOLATE, FERRITIN, TIBC, IRON, RETICCTPCT in the last 72 hours. Coag Panel:   Lab Results  Component Value Date   INR 1.25 04/28/2014   INR 1.20 04/14/2014   INR 1.16 03/26/2014    RADIOLOGY: Dg Chest 2 View  02/24/2015  CLINICAL DATA:  Shortness of breath. EXAM: CHEST  2 VIEW COMPARISON:  April 15, 2014 FINDINGS: Mild cardiomegaly is identified. The hila and mediastinum are unchanged. No pneumothorax. No pulmonary nodules or masses. Mild atelectasis in left lung base. Increased interstitial markings consistent with pulmonary venous congestion without overt edema. Mild streaky opacity over the heart on the lateral view may simply represent atelectasis. IMPRESSION: Mild pulmonary venous congestion. No acute abnormalities identified. Electronically Signed   By: Dorise Bullion III M.D   On: 02/24/2015 16:12   Dg Abd 1 View  02/25/2015  CLINICAL DATA:  Recent gastric catheter placement EXAM: ABDOMEN - 1 VIEW COMPARISON:  None. FINDINGS: Gastric catheter extends into the distal aspect of the stomach. Postsurgical changes are seen. No free air is noted. IMPRESSION: Gastric catheter within the distal stomach. Electronically Signed   By: Inez Catalina M.D.   On: 02/25/2015 15:38   Dg Chest [facility]  02/27/2015  CLINICAL DATA:  Intubation. EXAM: PORTABLE CHEST 1 VIEW COMPARISON:  02/26/2015 FINDINGS: Enteric catheter, endotracheal tube, left IJ approach central venous catheter stable. Cardiomediastinal silhouette is stably enlarged. There is no evidence of focal airspace consolidation, pleural effusion or pneumothorax. There is interstitial pulmonary edema. Osseous structures are without acute abnormality. Soft tissues are grossly normal. IMPRESSION: Stably enlarged cardiac silhouette with interstitial pulmonary edema. Stable support apparatus. Electronically Signed   By: Fidela Salisbury M.D.   On: 02/27/2015 08:52   Dg Chest Port 1 View  02/26/2015   CLINICAL DATA:  Check endotracheal tube placement EXAM: PORTABLE CHEST - 1 VIEW COMPARISON:  02/25/2015 FINDINGS: Endotracheal tube is again noted 4.5 cm above the carina in satisfactory position. A nasogastric catheter is noted within the stomach. Left jugular central line again is seen in the proximal superior vena cava. The cardiac shadow remains enlarged. Mild vascular congestion is again seen. No focal confluent infiltrate is noted. IMPRESSION: Stable central vascular congestion. Tubes and lines stable in appearance. Electronically Signed   By: Inez Catalina M.D.   On: 02/26/2015 07:26   Dg Chest Port 1 View  02/25/2015  CLINICAL DATA:  Encounter for endotracheal tube placement. EXAM: PORTABLE CHEST 1 VIEW COMPARISON:  Same day. FINDINGS: Stable cardiomegaly. Nasogastric tube is seen entering stomach. Endotracheal tube is seen projected over tracheal air shadow with distal tip 4 cm above the carina. Stable mild central pulmonary vascular congestion is noted. No pneumothorax or significant pleural effusion is noted. Bony thorax is unremarkable. Interval placement of left internal jugular catheter with distal tip in expected position of SVC. IMPRESSION: Endotracheal and nasogastric tubes are in grossly good position. Interval placement of left internal jugular catheter with distal tip in expected position of the SVC. No pneumothorax is noted. Electronically Signed   By: Marijo Conception, M.D.   On: 02/25/2015 15:38   Dg Chest Port 1 View  02/25/2015  CLINICAL DATA:  Short of breath EXAM: PORTABLE CHEST 1 VIEW COMPARISON:  300 hours FINDINGS: Moderate cardiomegaly. Mild vascular congestion. No pulmonary edema. No pneumothorax. IMPRESSION: Cardiomegaly and vascular congestion are stable. Electronically Signed   By: Marybelle Killings M.D.   On: 02/25/2015 07:40   Dg Chest Port 1 View  02/25/2015  CLINICAL DATA:  Acute onset of respiratory failure and hypoxia. Severe shortness of breath. Initial encounter. EXAM:  PORTABLE CHEST 1 VIEW COMPARISON:  Chest radiograph performed 02/24/2015 FINDINGS: The lungs are well-aerated. Vascular congestion is noted. Bibasilar airspace opacities may reflect mild interstitial edema or possibly pneumonia. There is no evidence of pleural effusion or pneumothorax. The cardiomediastinal silhouette is mildly enlarged. No acute osseous abnormalities are seen. IMPRESSION: Vascular congestion and mild cardiomegaly. Bibasilar airspace opacities may reflect mild interstitial edema or possibly pneumonia. Electronically Signed   By: Garald Balding M.D.   On: 02/25/2015 03:22      ASSESSMENT: Volume overload, respiratory failure, DVT.  PLAN:  COntinue diuresis as renal function tolerates. >4L out yesterday. Edema better in left leg.  Vent per CCM.  Patient did open eyes for a few seconds during the exam, while leg was being checked for edema.  Switched Xarelto to IV heparin since Xarelto absorption will be affected by no PO intake of food.  TO be managed by pharmacy.  Hypokalemia: likely due to diuresis.   Jettie Booze, MD  02/27/2015  11:00 AM

## 2015-02-27 NOTE — Progress Notes (Signed)
ANTICOAGULATION CONSULT NOTE - Follow Up Consult  Pharmacy Consult for Heparin  Indication: Hx PE  Allergies  Allergen Reactions  . Lyrica [Pregabalin] Other (See Comments)    Hallucinations     Patient Measurements: Height: 5\' 9"  (175.3 cm) Weight: (!) 416 lb (188.696 kg) IBW/kg (Calculated) : 70.7  Heparin dosing weight: 118 kg  Vital Signs: Temp: 98.3 F (36.8 C) (01/22 0400) Temp Source: Oral (01/22 0400) BP: 140/87 mmHg (01/22 1400) Pulse Rate: 86 (01/22 1400)  Labs:  Recent Labs  02/25/15 0302 02/25/15 0345 02/25/15 1013 02/26/15 0454  02/26/15 1421 02/26/15 1953 02/26/15 1954 02/27/15 0307 02/27/15 0309 02/27/15 1320  HGB  --   --   --  12.4*  --   --   --   --  13.2  --   --   HCT  --   --   --  44.5  --   --   --   --  46.2  --   --   PLT  --   --   --  178  --   --   --   --  189  --   --   APTT  --   --   --   --   < > 40*  --  64*  --  65* 60*  HEPARINUNFRC  --   --   --   --   --  1.26* 0.84*  --   --   --   --   CREATININE 0.89  --   --  0.94  --   --   --   --  0.97  --   --   TROPONINI  --  0.06* 0.09*  --   --   --   --   --   --   --   --   < > = values in this interval not displayed.  Estimated Creatinine Clearance: 160.4 mL/min (by C-G formula based on Cr of 0.97). Assessment: 89 YOM who presented on 1/20 with worsening dyspnea, LLE edema, and fluid retention. This required intubation on admit. PTA, the patient was on Xarelto for hx PE which was transitioned to Heparin IV due to lack of po intake and expected reduced Xarelto absorption while intubated.  The repeat aPTT level remains SUB-therapeutic and decreased (aPTT 60, goal of 66-102 seconds) despite increasing dose. HL remains falsely elevated in the setting of recent Xarelto administration. RN confirmed no known interruptions with heparin infusion and patient is not having any overt bleeding.   Goal of Therapy:  Heparin level 0.3-0.7 units/ml aPTT 66-102 seconds Monitor platelets by  anticoagulation protocol: Yes   Plan:  Increase heparin to 2450 units/hr Repeat aPTT in 6 hours  Sloan Leiter, PharmD, BCPS Clinical Pharmacist [phone]  02/27/2015,3:34 PM

## 2015-02-27 NOTE — Progress Notes (Signed)
PULMONARY / CRITICAL CARE MEDICINE   Name: Dean Bowers MRN: [id number] DOB: 1970/07/02    ADMISSION DATE:  02/24/2015 CONSULTATION DATE:  02/25/15  REFERRING MD :  [facility] PRIMARY SERVICE: PCCM   BRIEF PATIENT DESCRIPTION: 26-yo M with acute on chronic hypercapnic/hypoxic respiratory failure requiring intubation in setting of acute on chronic CHF and OSA/OHS.   SIGNIFICANT EVENTS / STUDIES:  1/19 Admitted and intubated, starting on IV diuresis  1/21 2D-echo EF 45-50%   LINES / TUBES: 1/20 ETT >> 1/20 OG >> 1/20 Triple lumen LIJ >> 1/20 Urethral catheter >>  CULTURES: 1/21 MRSA >>  ANTIBIOTICS: None  SUBJECTIVE: Pt currently on vent and arousable on fentanyl and versed drips. Denies pain. Yesterday with 6.4 L urine output and net negative 10.7 L since admission. Currently on 40% FIO2 and 8 of PEEP with SpO2 96%. No more red eye or crusting.   VITAL SIGNS: Temp:  [98.3 F (36.8 C)-100.3 F (37.9 C)] 98.3 F (36.8 C) (01/22 0400) Pulse Rate:  [65-91] 76 (01/22 0600) Resp:  [16-21] 21 (01/21 2027) BP: (108-146)/(34-87) 146/84 mmHg (01/22 0600) SpO2:  [92 %-100 %] 96 % (01/22 0600) FiO2 (%):  [40 %-50 %] 40 % (01/22 0827) Weight:  [416 lb (188.696 kg)] 416 lb (188.696 kg) (01/22 0322) HEMODYNAMICS:   VENTILATOR SETTINGS: Vent Mode:  [-] PRVC FiO2 (%):  [40 %-50 %] 40 % Set Rate:  [16 bmp] 16 bmp Vt Set:  [550 mL-560 mL] 550 mL PEEP:  [8 cmH20-10 cmH20] 8 cmH20 Plateau Pressure:  [23 [id number] cmH20] 23 cmH20 INTAKE / OUTPUT: Intake/Output      01/21 0701 - 01/22 0700 01/22 0701 - 01/23 0700   P.O.     I.V. (mL/kg) 482.6 (2.6)    NG/GT 657.5    IV Piggyback 558    Total Intake(mL/kg) 1698.1 (9)    Urine (mL/kg/hr) 6475 (1.4)    Stool     Total Output 6475     Net -4776.9            PHYSICAL EXAMINATION: General:  Morbidly obese, NAD Neuro:  Alert, opens eyes, nods to questions, follows commands  HEENT:  ETT with OT. Conjunctiva normal with no erythema  of eyes Cardiovascular: Normal rate and rhythm, distant heart sounds.  Lungs: Coarse /ronchi in anterior fields  Abdomen:  Soft, non-tender, non-distended, decreased BS Musculoskeletal: Right BKA. +2 LE edema   Skin: Tattoos, no rash  LABS:  CBC  Recent Labs Lab 02/24/15 1525 02/26/15 0454 02/27/15 0307  WBC 13.9* 10.8* 11.1*  HGB 13.1 12.4* 13.2  HCT 45.2 44.5 46.2  PLT 211 178 189   Coag's  Recent Labs Lab 02/26/15 1421 02/26/15 1954 02/27/15 0309  APTT 40* 64* 65*   BMET  Recent Labs Lab 02/25/15 0302 02/26/15 0454 02/27/15 0307  NA 141 144 147*  K 3.6 3.3* 3.1*  CL 92* 92* 94*  CO2 36* 42* 43*  BUN 15 11 14   CREATININE 0.89 0.94 0.97  GLUCOSE 140* 101* 121*   Electrolytes  Recent Labs Lab 02/25/15 0302 02/26/15 0454 02/27/15 0307  CALCIUM 8.9 8.8* 9.1  MG  --  1.9 2.5*  PHOS  --  1.8* 4.0   Sepsis Markers No results for input(s): LATICACIDVEN, PROCALCITON, O2SATVEN in the last 168 hours. ABG  Recent Labs Lab 02/25/15 1056 02/25/15 1720 02/26/15 0305  PHART 7.373 7.342* 7.433  PCO2ART 74.6* 90.8* 66.6*  PO2ART 45.7* 153.0* 76.5*   Liver Enzymes No results for  input(s): AST, ALT, ALKPHOS, BILITOT, ALBUMIN in the last 168 hours. Cardiac Enzymes  Recent Labs Lab 02/25/15 0345 02/25/15 1013  TROPONINI 0.06* 0.09*   Glucose  Recent Labs Lab 02/26/15 0714 02/26/15 1154 02/26/15 1601 02/26/15 1944 02/26/15 2329 02/27/15 0315  GLUCAP 113* 107* 88 107* 123* 111*    Imaging Dg Abd 1 View  02/25/2015  CLINICAL DATA:  Recent gastric catheter placement EXAM: ABDOMEN - 1 VIEW COMPARISON:  None. FINDINGS: Gastric catheter extends into the distal aspect of the stomach. Postsurgical changes are seen. No free air is noted. IMPRESSION: Gastric catheter within the distal stomach. Electronically Signed   By: Inez Catalina M.D.   On: 02/25/2015 15:38   Dg Chest Port 1 View  02/27/2015  CLINICAL DATA:  Intubation. EXAM: PORTABLE CHEST 1 VIEW  COMPARISON:  02/26/2015 FINDINGS: Enteric catheter, endotracheal tube, left IJ approach central venous catheter stable. Cardiomediastinal silhouette is stably enlarged. There is no evidence of focal airspace consolidation, pleural effusion or pneumothorax. There is interstitial pulmonary edema. Osseous structures are without acute abnormality. Soft tissues are grossly normal. IMPRESSION: Stably enlarged cardiac silhouette with interstitial pulmonary edema. Stable support apparatus. Electronically Signed   By: Fidela Salisbury M.D.   On: 02/27/2015 08:52   Dg Chest Port 1 View  02/26/2015  CLINICAL DATA:  Check endotracheal tube placement EXAM: PORTABLE CHEST - 1 VIEW COMPARISON:  02/25/2015 FINDINGS: Endotracheal tube is again noted 4.5 cm above the carina in satisfactory position. A nasogastric catheter is noted within the stomach. Left jugular central line again is seen in the proximal superior vena cava. The cardiac shadow remains enlarged. Mild vascular congestion is again seen. No focal confluent infiltrate is noted. IMPRESSION: Stable central vascular congestion. Tubes and lines stable in appearance. Electronically Signed   By: Inez Catalina M.D.   On: 02/26/2015 07:26   Dg Chest Port 1 View  02/25/2015  CLINICAL DATA:  Encounter for endotracheal tube placement. EXAM: PORTABLE CHEST 1 VIEW COMPARISON:  Same day. FINDINGS: Stable cardiomegaly. Nasogastric tube is seen entering stomach. Endotracheal tube is seen projected over tracheal air shadow with distal tip 4 cm above the carina. Stable mild central pulmonary vascular congestion is noted. No pneumothorax or significant pleural effusion is noted. Bony thorax is unremarkable. Interval placement of left internal jugular catheter with distal tip in expected position of SVC. IMPRESSION: Endotracheal and nasogastric tubes are in grossly good position. Interval placement of left internal jugular catheter with distal tip in expected position of the SVC. No  pneumothorax is noted. Electronically Signed   By: Marijo Conception, M.D.   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  History of COPD of unclear severity Tobacco abuse  P:   Full vent settings Wean PEEP/FIO2 for SpO2 >92% SBT daily  IV lasix 80 mg TID (net -12 L) as BP/renal function tolerates IV heparin per pharmacy  Counsel on tobacco cessation CXR in AM  CARDIOVASCULAR A: Acute on chronic combined CHF (EF 45-50% on 1/21) - net -12L Hypertension Hyperlipidemia  P:  Monitor daily weights & strict I/O's IV lasix 80 mg TID as BP/renal function tolerates PO diltiazem 120 mg daily  Aspirin 81 mg daily, considering dc'ing in setting of AC use and no active CVD  RENAL A: Hypokalemia - 3.1  Hypophosphatemia -resolved  Hypernatremia  P:   Replete K  Monitor BMP and UOP  GASTROINTESTINAL A:  Morbid obesity GI PPx P:   Tube feeds  IV protonix 40 mg daily   HEMATOLOGIC A:  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 P:  IV heparin   INFECTIOUS A:  Leukocytosis - improving P:   Monitor for health-care acquired infection Monitor CBC and fever curve  ENDOCRINE A:  Hyperglycemia  Newly diagnosed Type 2 DM - A1c 6.5 on 02/25/15 P:   SSI for goal CBG <180 Needs outpatient follow-up DM consult once PO intake  Counsel on lifestyle modification   NEUROLOGIC A:  Sedation on vent Phantom pain  Chronic back pain  Anxiety  P:   Versed and fentanyl infusions  Wean sedation for SBT WUA daily  Xanax 0.5 mg TID PRN Trazodone 50 mg daily at bedtime  Gabapentin 600 mg BID    Juluis Mire MD PGY-3 IMTS Pager: [phone]    02/27/2015, 8:33 AM

## 2015-02-28 ENCOUNTER — Ambulatory Visit: Payer: 59 | Admitting: Internal Medicine

## 2015-02-28 ENCOUNTER — Inpatient Hospital Stay (HOSPITAL_COMMUNITY): Payer: 59

## 2015-02-28 DIAGNOSIS — I509 Heart failure, unspecified: Secondary | ICD-10-CM

## 2015-02-28 DIAGNOSIS — I2699 Other pulmonary embolism without acute cor pulmonale: Secondary | ICD-10-CM

## 2015-02-28 DIAGNOSIS — E662 Morbid (severe) obesity with alveolar hypoventilation: Secondary | ICD-10-CM | POA: Diagnosis present

## 2015-02-28 DIAGNOSIS — I5043 Acute on chronic combined systolic (congestive) and diastolic (congestive) heart failure: Secondary | ICD-10-CM

## 2015-02-28 DIAGNOSIS — Z89521 Acquired absence of right knee: Secondary | ICD-10-CM

## 2015-02-28 LAB — BASIC METABOLIC PANEL
Anion gap: 10 (ref 5–15)
BUN: 20 mg/dL (ref 6–20)
CALCIUM: 9.7 mg/dL (ref 8.9–10.3)
CO2: 43 mmol/L — ABNORMAL HIGH (ref 22–32)
CREATININE: 1.01 mg/dL (ref 0.61–1.24)
Chloride: 98 mmol/L — ABNORMAL LOW (ref 101–111)
GFR calc non Af Amer: >60 mL/min (ref >60)
Glucose, Bld: 123 mg/dL — ABNORMAL HIGH (ref 65–99)
Potassium: 3.3 mmol/L — ABNORMAL LOW (ref 3.5–5.1)
SODIUM: 151 mmol/L — AB (ref 135–145)

## 2015-02-28 LAB — GLUCOSE, CAPILLARY
GLUCOSE-CAPILLARY: 113 mg/dL — AB (ref 65–99)
GLUCOSE-CAPILLARY: 127 mg/dL — AB (ref 65–99)
GLUCOSE-CAPILLARY: 138 mg/dL — AB (ref 65–99)
Glucose-Capillary: 104 mg/dL — ABNORMAL HIGH (ref 65–99)
Glucose-Capillary: 116 mg/dL — ABNORMAL HIGH (ref 65–99)
Glucose-Capillary: 114 mg/dL — ABNORMAL HIGH (ref 65–99)

## 2015-02-28 LAB — CBC
HCT: 49.2 % (ref 39.0–52.0)
Hemoglobin: 14.1 g/dL (ref 13.0–17.0)
MCH: 22.8 pg — AB (ref 26.0–34.0)
MCHC: 28.7 g/dL — ABNORMAL LOW (ref 30.0–36.0)
MCV: 79.5 fL (ref 78.0–100.0)
PLATELETS: 206 10*3/uL (ref 150–400)
RBC: 6.19 MIL/uL — AB (ref 4.22–5.81)
RDW: 18.2 % — AB (ref 11.5–15.5)
WBC: 11.2 10*3/uL — AB (ref 4.0–10.5)

## 2015-02-28 LAB — APTT: APTT: 92 s — AB (ref 24–37)

## 2015-02-28 LAB — PHOSPHORUS: Phosphorus: 4.6 mg/dL (ref 2.5–4.6)

## 2015-02-28 LAB — HEPARIN LEVEL (UNFRACTIONATED): HEPARIN UNFRACTIONATED: 0.47 [IU]/mL (ref 0.30–0.70)

## 2015-02-28 LAB — MAGNESIUM: Magnesium: 2.4 mg/dL (ref 1.7–2.4)

## 2015-02-28 MED ORDER — LOSARTAN POTASSIUM 25 MG PO TABS
25.0000 mg | ORAL_TABLET | Freq: Every day | ORAL | Status: DC
  Administered 2015-02-28 – 2015-03-01 (×2): 25 mg via ORAL
  Filled 2015-02-28 (×2): qty 1

## 2015-02-28 MED ORDER — INSULIN ASPART 100 UNIT/ML ~~LOC~~ SOLN: 0.0000 [IU] | SUBCUTANEOUS | Status: DC

## 2015-02-28 MED ORDER — DEXTROSE 5 % IV SOLN
INTRAVENOUS | Status: DC
  Administered 2015-02-28: 11:00:00 via INTRAVENOUS

## 2015-02-28 MED ORDER — POTASSIUM CHLORIDE 10 MEQ/50ML IV SOLN
10.0000 meq | INTRAVENOUS | Status: AC
  Administered 2015-02-28 (×3): 10 meq via INTRAVENOUS
  Filled 2015-02-28: qty 50

## 2015-02-28 MED ORDER — ENOXAPARIN SODIUM 150 MG/ML ~~LOC~~ SOLN
180.0000 mg | Freq: Two times a day (BID) | SUBCUTANEOUS | Status: DC
  Administered 2015-02-28 (×2): 180 mg via SUBCUTANEOUS
  Filled 2015-02-28 (×4): qty 1.2
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
## 2015-02-28 NOTE — Progress Notes (Signed)
D/C versed. Versed 50ml wasted per protocol per two RN.

## 2015-02-28 NOTE — Progress Notes (Signed)
Primary Physician [name], MD Primary Cardiologist Dr. Ellyn Hack Reason for Consultation CHF Requesting Physician Dr. Hosie Poisson  HPI: Dean Bowers is a 45 y.o. 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) of on Xarelto, COPD, OSA/OHS on CPAP, right BKD, hypoventilation syndrome, HTN, ongoing tobacco smoking and Marijuana abuse who presented to [facility] ED 02/24/15 evening for worsening sob and LE edema.  After failing BiPAP, he was intubated on 02/25/15 (by report,this is the 4th intubation in ~1 yr). His weight during clinic visit in June 2016 was 360 Lb - was 424 @ last PCP check & 416 lb on admission. He admits to dietary indiscretion.  He has had a prior admission with ~90 lb diuresis.  Principal Problem:   Acute respiratory failure with hypoxia and hypercarbia (HCC) Active Problems:   Right heart failure (HCC)   Pulmonary embolus, left (HCC) - history of   Acute respiratory failure with hypoxia (HCC)   Acute on chronic combined systolic and diastolic heart failure (HCC)   Obesity hypoventilation syndrome (HCC)   Morbid obesity ([place])   Hyperlipidemia associated with type 2 diabetes mellitus (HCC)   Asthma, chronic   Polysubstance abuse   Essential hypertension   OSA (obstructive sleep apnea)   Anticoagulation adequate, on xarelto   Acute on chronic diastolic (congestive) heart failure (HCC)   Respiratory failure (HCC)   CHF exacerbation (HCC)   Anasarca  Now on IV Lasix 80 mg TID.  CVP this AM - per RN was 19  Subjective:  Wide awake, intubated.  Writing on white board.  A&Ox3.   Wants ETT out (pending Pulm evaluation this AM). -- per RN, still has thick secretions.  Objective:  Vital Signs in the last 24 hours: Temp:  [98.5 F (36.9 C)-99.3 F (37.4 C)] 98.6 F (37 C) (01/23 0700) Pulse Rate:  [72-96] 81 (01/23 0700) Resp:  [16-20] 16 (01/22 2039) BP:  (100-146)/(62-98) 136/91 mmHg (01/23 0700) SpO2:  [89 %-97 %] 94 % (01/23 0700) FiO2 (%):  [40 %] 40 % (01/23 0700) Weight:  [412 lb (186.882 kg)] 412 lb (186.882 kg) (01/23 0600)  Intake/Output from previous day: 01/22 0701 - 01/23 0700 In: 2198.2 [I.V.:738.2; NG/GT:1260; IV Piggyback:200] Out: 4865 [Urine:4865] Intake/Output from this shift: Total I/O In: 97 [I.V.:7; NG/GT:40; IV Piggyback:50] Out: 800 [Urine:800]  -- Net out o/n ~2.6 L (already ~800 mL this AM) -- Net out since admission 14L. (bed weight is inaccurate).  Physical Exam: General appearance: alert, cooperative, appears stated age, no distress, morbidly obese and answers questions with head nod, hand signals & writing Neck: unable to see JVP - partially due to central lines, but mostly due to body habitus Lungs: On vent - coarse BS bilaterally, with body habitus hard to hear breath sounds, but no obvious rales Heart: Very distant S1&2.  No obvious M/R/G Abdomen: obese, soft/NT.  + edema noted.  unable to palpate for HSM or HJR due to body habitus. Extremities: R BKA, trace LLE edema Pulses: 2+ and symmetric Skin: venous stasis changes on LLE.  Multiple tatoos Neurologic: Mental status: Alert, oriented, thought content appropriate Cranial nerves: normal  Lab Results:  Recent Labs  02/27/15 0307 02/28/15 0442  WBC 11.1* 11.2*  HGB 13.2 14.1  PLT 189 206    Recent Labs  02/27/15 0307 02/28/15 0442  NA 147* 151*  K 3.1* 3.3*  CL 94* 98*  CO2 43*  43*  GLUCOSE 121* 123*  BUN 14 20  CREATININE 0.97 1.01    Recent Labs  02/25/15 1013  TROPONINI 0.09*   Hepatic Function Panel No results for input(s): PROT, ALBUMIN, AST, ALT, ALKPHOS, BILITOT, BILIDIR, IBILI in the last 72 hours. No results for input(s): CHOL in the last 72 hours. No results for input(s): PROTIME in the last 72 hours.  Imaging: Imaging results have been reviewed - PCXR 1/23: - ETT in place, mild interstitial edema.  Cardiac  Studies:  Echo 1/21: Poor quality due to body habitus. - Left ventricle: The cavity size was normal. Systolic function was mildly reduced. The estimated ejection fraction was in the range of 45% to 50%. Images were inadequate for LV wall motion assessment. - Left atrium: The atrium was mildly dilated. - Right ventricle: Not able to assess RV function due to poor visualization.  Assessment/Plan:  Principal Problem:   Acute respiratory failure with hypoxia and hypercarbia (HCC) - multifactorial  Morbid obesity (HCC) / OSA (obstructive sleep apnea) /Obesity hypoventilation syndrome (HCC)  Right heart failure (HCC) / Cor Pulmonale - CVP 19 this AM  Pulmonary embolus, left (HCC) - history of --> was on Xarelto (converted to Heparin - now change to Lovenox to avoid volume), no bleeding issue.  Can convert back to [place] for d/c.  Acute on chronic combined systolic and diastolic heart failure ([place]) - Echo EF estimated 45-50%; dietary indiscretion  Currently intubated - slow wean from vent given history of difficult wean in past - per PCCM  Diuresing well with IV Lasix - probably has several days of IV diuresis left   Add ARB for afterload reduction.  As EF is ~mildly reduced, ok to use CCB for now (for potential pulmonary vascular benefit).    Hyperlipidemia associated with type 2 diabetes mellitus (HCC)   Asthma, chronic - per PCCM -    Polysubstance abuse - counseling   Essential hypertension - on diltiazem @ home (converted to short acting as in pt) -- with DHF & reduced EF, will add ARB for afterload reduction.      Anticoagulation adequate, on xarelto - converted to IV lasix, will change to Rx dose Sq Lovenox to avoid XS volume    Anasarca - 2/2 RHF (Cor Pulmonale) - continue diuresis until signs of dehydration from change in renal fxn as bed wgt not accurate.   Hypokalemia - repleted by PCCM, ARB will help  DM-2 management per PCCM   LOS: 4 days    HARDING, DAVID W 02/28/2015,  8:26 AM

## 2015-02-28 NOTE — Progress Notes (Addendum)
ANTICOAGULATION CONSULT NOTE - Initial Consult  Pharmacy Consult for Lovenox Indication: Hx PE  Allergies  Allergen Reactions  . Lyrica [Pregabalin] Other (See Comments)    Hallucinations     Patient Measurements: Height: 5\' 9"  (175.3 cm) Weight: (!) 412 lb (186.882 kg) (pt bed is weighing inaccuratly ) IBW/kg (Calculated) : 70.7  Heparin dosing weight: 118 kg  Vital Signs: Temp: 98.6 F (37 C) (01/23 0700) Temp Source: Oral (01/23 0700) BP: 123/87 mmHg (01/23 0600) Pulse Rate: 79 (01/23 0600)  Labs:  Recent Labs  02/25/15 1013  02/26/15 0454 02/26/15 1421 02/26/15 1953  02/27/15 0307  02/27/15 1320 02/27/15 2215 02/28/15 0442 02/28/15 0500  HGB  --   < > 12.4*  --   --   --  13.2  --   --   --  14.1  --   HCT  --   --  44.5  --   --   --  46.2  --   --   --  49.2  --   PLT  --   --  178  --   --   --  189  --   --   --  206  --   APTT  --   --   --  40*  --   < >  --   < > 60* 69*  --  92*  HEPARINUNFRC  --   --   --  1.26* 0.84*  --   --   --   --   --   --  0.47  CREATININE  --   --  0.94  --   --   --  0.97  --   --   --  1.01  --   TROPONINI 0.09*  --   --   --   --   --   --   --   --   --   --   --   < > = values in this interval not displayed.  Estimated Creatinine Clearance: 153.1 mL/min (by C-G formula based on Cr of 1.01).  Assessment: 54 YOM who presented on 1/20 with worsening dyspnea, LLE edema, and fluid retention. This required intubation on admit. PTA, the patient was on Xarelto for hx PE which was transitioned to Heparin IV, now to be transition to SQ lovenox to decrease the amount of volume the patient is receiving.   Note pt weight likely inaccurate, unable to obtain bed weight. MD estimates weight to be 400lbs.  Hgb 14.1, plt wnl  Goal of Therapy:  Anti-Xa level 0.6-1 units/ml 4hrs after LMWH dose given Monitor platelets by anticoagulation protocol: Yes   Plan:  Stop heparin infusion, wait 1 hr then give first dose of Lovenox sq Start  Lovenox 180mg  Q12 Monitor patients weight with diuresis for the need to change dose  Darl Pikes, PharmD Clinical Pharmacist- Resident Pager: [phone]   02/28/2015,8:11 AM

## 2015-02-28 NOTE — Progress Notes (Signed)
PULMONARY / CRITICAL CARE MEDICINE   Name: Dean Bowers MRN: [id number] DOB: 02-06-1970    ADMISSION DATE:  02/24/2015 CONSULTATION DATE:  02/25/15  REFERRING MD :  [facility] PRIMARY SERVICE: PCCM   BRIEF PATIENT DESCRIPTION: 42-yo M with acute on chronic hypercapnic/hypoxic respiratory failure requiring intubation in setting of acute on chronic CHF and OSA/OHS.   SIGNIFICANT EVENTS / STUDIES:  1/19 Admitted and intubated, starting on IV diuresis  1/21 2D-echo EF 45-50%   LINES / TUBES: 1/20 ETT >> 1/20 OG >> 1/20 Triple lumen LIJ >> 1/20 Urethral catheter >>  CULTURES: 1/21 MRSA >>  ANTIBIOTICS: None  SUBJECTIVE: Pt currently on vent and alert. Denies pain. Wants tube out. Yesterday with 4.8  L urine output and net negative 13.4 L since admission. Wt inaccurate. Currently on 40% FIO2 and 8 of PEEP with SpO2 96%.  BP stable with CVP 9.   VITAL SIGNS: Temp:  [98.5 F (36.9 C)-99.3 F (37.4 C)] 98.6 F (37 C) (01/23 0700) Pulse Rate:  [64-96] 79 (01/23 0600) Resp:  [16-20] 16 (01/22 2039) BP: (100-151)/(62-98) 123/87 mmHg (01/23 0600) SpO2:  [89 %-99 %] 96 % (01/23 0600) FiO2 (%):  [40 %] 40 % (01/23 0400) Weight:  [412 lb (186.882 kg)] 412 lb (186.882 kg) (01/23 0600) HEMODYNAMICS: CVP:  [9 mmHg] 9 mmHg VENTILATOR SETTINGS: Vent Mode:  [-] PRVC FiO2 (%):  [40 %] 40 % Set Rate:  [16 bmp] 16 bmp Vt Set:  [550 mL] 550 mL PEEP:  [8 cmH20] 8 cmH20 Plateau Pressure:  [23 cmH20-25 cmH20] 24 cmH20 INTAKE / OUTPUT: Intake/Output      01/22 0701 - 01/23 0700 01/23 0701 - 01/24 0700   I.V. (mL/kg) 706.7 (3.8)    NG/GT 1220    IV Piggyback 200    Total Intake(mL/kg) 2126.7 (11.4)    Urine (mL/kg/hr) 4865 (1.1)    Total Output 4865     Net -2738.3            PHYSICAL EXAMINATION: General:  Morbidly obese, NAD Neuro:  Alert, opens eyes, nods to questions, follows commands  HEENT:  ETT with OT. Conjunctiva normal with no erythema of eyes Cardiovascular: Normal rate  and rhythm, distant heart sounds.  Lungs: Coarse /ronchi in anterior fields  Abdomen:  Soft, non-tender, non-distended, decreased BS Musculoskeletal: Right BKA. +1 LE edema   Skin: Tattoos, no rash  LABS:  CBC  Recent Labs Lab 02/26/15 0454 02/27/15 0307 02/28/15 0442  WBC 10.8* 11.1* 11.2*  HGB 12.4* 13.2 14.1  HCT 44.5 46.2 49.2  PLT 178 189 206   Coag's  Recent Labs Lab 02/27/15 1320 02/27/15 2215 02/28/15 0500  APTT 60* 69* 92*   BMET  Recent Labs Lab 02/26/15 0454 02/27/15 0307 02/28/15 0442  NA 144 147* 151*  K 3.3* 3.1* 3.3*  CL 92* 94* 98*  CO2 42* 43* 43*  BUN 11 14 20   CREATININE 0.94 0.97 1.01  GLUCOSE 101* 121* 123*   Electrolytes  Recent Labs Lab 02/26/15 0454 02/27/15 0307 02/28/15 0442  CALCIUM 8.8* 9.1 9.7  MG 1.9 2.5* 2.4  PHOS 1.8* 4.0 4.6   Sepsis Markers No results for input(s): LATICACIDVEN, PROCALCITON, O2SATVEN in the last 168 hours. ABG  Recent Labs Lab 02/25/15 1056 02/25/15 1720 02/26/15 0305  PHART 7.373 7.342* 7.433  PCO2ART 74.6* 90.8* 66.6*  PO2ART 45.7* 153.0* 76.5*   Liver Enzymes No results for input(s): AST, ALT, ALKPHOS, BILITOT, ALBUMIN in the last 168 hours. Cardiac Enzymes  Recent Labs Lab 02/25/15 0345 02/25/15 1013  TROPONINI 0.06* 0.09*   Glucose  Recent Labs Lab 02/27/15 1202 02/27/15 1758 02/27/15 2023 02/27/15 2207 02/28/15 0023 02/28/15 0427  GLUCAP 131* 118* 116* 112* 138* 127*    Imaging Dg Chest Port 1 View  02/28/2015  CLINICAL DATA:  Chronic CHF, asthma, acute respiratory failure, intubated patient. EXAM: PORTABLE CHEST 1 VIEW COMPARISON:  Portable chest x-ray of February 27, 2015 FINDINGS: The lungs are adequately inflated. The interstitial markings remain increased bilaterally. There is no alveolar infiltrate. There is no pleural effusion or pneumothorax. The cardiac silhouette remains enlarged. The pulmonary vascularity remains mildly engorged. The endotracheal tube tip  lies approximately 4.5 cm above the carina. The esophagogastric tube tip projects below the inferior margin of the image. The left internal jugular venous catheter tip projects over the midportion of the SVC. The bony thorax exhibits no acute abnormality. IMPRESSION: Intubated patient with stable positioning of the support tubes. Mild interstitial edema, stable. Electronically Signed   By: David  [place] M.D.   On: 02/28/2015 07:08   Dg Chest Port 1 View  02/27/2015  CLINICAL DATA:  Intubation. EXAM: PORTABLE CHEST 1 VIEW COMPARISON:  02/26/2015 FINDINGS: Enteric catheter, endotracheal tube, left IJ approach central venous catheter stable. Cardiomediastinal silhouette is stably enlarged. There is no evidence of focal airspace consolidation, pleural effusion or pneumothorax. There is interstitial pulmonary edema. Osseous structures are without acute abnormality. Soft tissues are grossly normal. IMPRESSION: Stably enlarged cardiac silhouette with interstitial pulmonary edema. Stable support apparatus. Electronically Signed   By: Fidela Salisbury M.D.   On: 02/27/2015 08:52     CXR: above   ASSESSMENT / PLAN:  PULMONARY A: Acute on chronic hypercapnic/hypoxic respiratory failure requiring ventilatory support due to AECHF Acute on chronic combined CHF - CXR with mild edema OSA/OHS History of PE on chronic AC therapy  History of COPD of unclear severity Tobacco abuse  P:   Full vent settings Wean PEEP/FIO2 for SpO2 >92% SBT daily  IV lasix 80 mg TID as BP/renal function tolerates IV heparin per pharmacy, consider lovenox   Counsel on tobacco cessation CXR in AM Goal to peep 5 Wean cpap 8 ps 10  CARDIOVASCULAR A: Acute on chronic combined CHF (EF 45-50% on 1/21) - net -13.4L Hypertension Hyperlipidemia  P:  Cardiology following  Monitor daily weights & strict I/O's IV lasix 80 mg TID as BP/renal function tolerates PO diltiazem 120 mg daily  Aspirin 81 mg daily, considering dc'ing in  setting of AC use and no active CVD  RENAL A: Hypokalemia - 3.3 Hypophosphatemia -resolved  Hypernatremia - 151 P:   Replete K  Consider starting free water via tube Monitor BMP and UOP Add d5w  GASTROINTESTINAL A:  Morbid obesity GI PPx P:   Tube feeds  IV protonix 40 mg daily  Assess last BM  HEMATOLOGIC A:  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 P:  IV heparin per pharmacy, consider lovenox   INFECTIOUS A:  Leukocytosis - stable P:   Monitor for health-care acquired infection Monitor CBC and fever curve  ENDOCRINE A:  Hyperglycemia  Newly diagnosed Type 2 DM - A1c 6.5 on 02/25/15 P:   SSI for goal CBG <180 Needs outpatient follow-up DM consult once PO intake   NEUROLOGIC A:  Sedation on vent Phantom pain  Chronic back pain  Anxiety  P:   Versed and fentanyl infusions , consider dc versed  Wean sedation for SBT WUA daily  Xanax 0.5 mg TID PRN Trazodone 50 mg daily at bedtime  Gabapentin 600 mg BID   Juluis Mire MD PGY-3 IMTS Pager: [phone]    02/28/2015, 7:37 AM   STAFF NOTE: Linwood Dibbles, MD FACP have personally reviewed patient's available data, including medical history, events of note, physical examination and test results as part of my evaluation. I have discussed with resident/NP and other care providers such as pharmacist, RN and RRT. In addition, I personally evaluated patient and elicited key findings of: awake, alert, change back home prn xanax, dc versed, high risk delirium, avoid benzo drip, abg reviewed, rate 14, weaning cpap 8 ps 5-10, maintain neg balance further, lasix maintain, may need reduction, add d5w 30 , re assess na in am , no fevers, PT consult walk on vent, chem in am , i updated family and pt at bedside, may need mucoysts, would prefer precedex, not versed The patient is critically ill with multiple organ systems failure and requires high complexity decision making for assessment and support, frequent evaluation  and titration of therapies, application of advanced monitoring technologies and extensive interpretation of multiple databases.   Critical Care Time devoted to patient care services described in this note is30 Minutes. This time reflects time of care of this signee: Merrie Roof, MD FACP. This critical care time does not reflect procedure time, or teaching time or supervisory time of PA/NP/Med student/Med Resident etc but could involve care discussion time. Rest per NP/medical resident whose note is outlined above and that I agree with   Lavon Paganini. Titus Mould, MD, [place] Pgr: [facility] 02/28/2015 10:10 AM

## 2015-03-01 ENCOUNTER — Inpatient Hospital Stay (HOSPITAL_COMMUNITY): Payer: 59

## 2015-03-01 LAB — GLUCOSE, CAPILLARY
GLUCOSE-CAPILLARY: 114 mg/dL — AB (ref 65–99)
Glucose-Capillary: 103 mg/dL — ABNORMAL HIGH (ref 65–99)
Glucose-Capillary: 106 mg/dL — ABNORMAL HIGH (ref 65–99)
Glucose-Capillary: 191 mg/dL — ABNORMAL HIGH (ref 65–99)
Glucose-Capillary: 106 mg/dL — ABNORMAL HIGH (ref 65–99)

## 2015-03-01 LAB — COMPREHENSIVE METABOLIC PANEL
ALBUMIN: 3.6 g/dL (ref 3.5–5.0)
ALK PHOS: 64 U/L (ref 38–126)
ALT: 82 U/L — ABNORMAL HIGH (ref 17–63)
ANION GAP: 15 (ref 5–15)
AST: 57 U/L — ABNORMAL HIGH (ref 15–41)
BUN: 42 mg/dL — ABNORMAL HIGH (ref 6–20)
CO2: 38 mmol/L — AB (ref 22–32)
Calcium: 9.7 mg/dL (ref 8.9–10.3)
Chloride: 96 mmol/L — ABNORMAL LOW (ref 101–111)
Creatinine, Ser: 3.63 mg/dL — ABNORMAL HIGH (ref 0.61–1.24)
GFR calc Af Amer: 22 mL/min — ABNORMAL LOW (ref >60)
GFR calc non Af Amer: 19 mL/min — ABNORMAL LOW (ref >60)
GLUCOSE: 116 mg/dL — AB (ref 65–99)
POTASSIUM: 3.6 mmol/L (ref 3.5–5.1)
SODIUM: 149 mmol/L — AB (ref 135–145)
Total Bilirubin: 1 mg/dL (ref 0.3–1.2)
Total Protein: 7.7 g/dL (ref 6.5–8.1)

## 2015-03-01 LAB — BASIC METABOLIC PANEL
ANION GAP: 15 (ref 5–15)
BUN: 44 mg/dL — ABNORMAL HIGH (ref 6–20)
CO2: 36 mmol/L — AB (ref 22–32)
Calcium: 9.5 mg/dL (ref 8.9–10.3)
Chloride: 97 mmol/L — ABNORMAL LOW (ref 101–111)
Creatinine, Ser: 2.78 mg/dL — ABNORMAL HIGH (ref 0.61–1.24)
GFR calc Af Amer: 30 mL/min — ABNORMAL LOW (ref >60)
GFR, EST NON AFRICAN AMERICAN: 26 mL/min — AB (ref >60)
GLUCOSE: 117 mg/dL — AB (ref 65–99)
POTASSIUM: 3.4 mmol/L — AB (ref 3.5–5.1)
Sodium: 148 mmol/L — ABNORMAL HIGH (ref 135–145)

## 2015-03-01 LAB — CBC
HCT: 47.5 % (ref 39.0–52.0)
HEMOGLOBIN: 13.4 g/dL (ref 13.0–17.0)
MCH: 22.3 pg — AB (ref 26.0–34.0)
MCHC: 28.2 g/dL — ABNORMAL LOW (ref 30.0–36.0)
MCV: 78.9 fL (ref 78.0–100.0)
Platelets: 204 10*3/uL (ref 150–400)
RBC: 6.02 MIL/uL — AB (ref 4.22–5.81)
RDW: 18.4 % — ABNORMAL HIGH (ref 11.5–15.5)
WBC: 13.8 10*3/uL — ABNORMAL HIGH (ref 4.0–10.5)

## 2015-03-01 LAB — HEPARIN ANTI-XA
Heparin LMW: 1.03 IU/mL
Heparin LMW: 0.76 IU/mL

## 2015-03-01 LAB — APTT
APTT: 49 s — AB (ref 24–37)
aPTT: 51 seconds — ABNORMAL HIGH (ref 24–37)

## 2015-03-01 LAB — MAGNESIUM: Magnesium: 2.5 mg/dL — ABNORMAL HIGH (ref 1.7–2.4)

## 2015-03-01 LAB — HEPARIN LEVEL (UNFRACTIONATED)
HEPARIN UNFRACTIONATED: 0.51 [IU]/mL (ref 0.30–0.70)
Heparin Unfractionated: 1.03 IU/mL — ABNORMAL HIGH (ref 0.30–0.70)

## 2015-03-01 LAB — PHOSPHORUS: Phosphorus: 5.2 mg/dL — ABNORMAL HIGH (ref 2.5–4.6)

## 2015-03-01 MED ORDER — POTASSIUM CHLORIDE CRYS ER 20 MEQ PO TBCR
20.0000 meq | EXTENDED_RELEASE_TABLET | Freq: Once | ORAL | Status: AC
  Administered 2015-03-01: 20 meq via ORAL
  Filled 2015-03-01: qty 1

## 2015-03-01 MED ORDER — HEPARIN (PORCINE) IN NACL 100-0.45 UNIT/ML-% IJ SOLN: 2450.0000 [IU]/h | INTRAMUSCULAR | Status: DC

## 2015-03-01 MED ORDER — SODIUM CHLORIDE 0.9 % IV BOLUS (SEPSIS)
1000.0000 mL | Freq: Once | INTRAVENOUS | Status: AC
  Administered 2015-03-01: 1000 mL via INTRAVENOUS

## 2015-03-01 MED ORDER — INSULIN ASPART 100 UNIT/ML ~~LOC~~ SOLN
0.0000 [IU] | Freq: Three times a day (TID) | SUBCUTANEOUS | Status: DC
  Administered 2015-03-02: 2 [IU] via SUBCUTANEOUS
  Administered 2015-03-02: 3 [IU] via SUBCUTANEOUS
  Administered 2015-03-02: 2 [IU] via SUBCUTANEOUS

## 2015-03-01 MED ORDER — FUROSEMIDE 10 MG/ML IJ SOLN
80.0000 mg | Freq: Once | INTRAMUSCULAR | Status: AC
  Administered 2015-03-01: 80 mg via INTRAVENOUS
  Filled 2015-03-01: qty 8

## 2015-03-01 NOTE — Progress Notes (Signed)
CRITICAL VALUE ALERT  Critical value received:  phosporus 5.2, creatinine 3.63  Date of notification:  03/01/2015 Time of notification:  [id number]  Critical value read back:yes  Nurse who received alert:  Purcell Nails  MD notified (1st page):  [place]  Time of first page:  0550  MD notified (2nd page):  Time of second page:  Responding MD:  [place]  Time MD responded:  [phone]

## 2015-03-01 NOTE — Procedures (Signed)
Pt was not tolerating the NIV mode on the servo.  RT changed from servo to respironics machine.  RT also change from full mask to nasal mask for pt's comfort.  Setting made for pt's comfort.  RT will continue to monitor pt and make changes as needed.

## 2015-03-01 NOTE — Progress Notes (Signed)
PT Cancellation Note  Patient Details Name: Dean Bowers MRN: [id number] DOB: 01-Jan-1971   Cancelled Treatment:    Reason Eval/Treat Not Completed: Medical issues which prohibited therapy (Nurse says actively weaning.  Asked PT to check back in am. )Thanks.    Irwin Brakeman F 03/01/2015, 9:07 AM  [facility] [phone] (pager)

## 2015-03-01 NOTE — Progress Notes (Signed)
100 ml's of fentanyl wasted in sink. Witnessed by Julien Girt, RN and Jettie Booze, RN.

## 2015-03-01 NOTE — Progress Notes (Signed)
PULMONARY / CRITICAL CARE MEDICINE   Name: Dean Bowers MRN: [id number] DOB: 26-Jul-1970    ADMISSION DATE:  02/24/2015 CONSULTATION DATE:  02/25/15  REFERRING MD :  [facility] PRIMARY SERVICE: PCCM   BRIEF PATIENT DESCRIPTION: 35-yo M with acute on chronic hypercapnic/hypoxic respiratory failure requiring intubation in setting of acute on chronic CHF and OSA/OHS.   SIGNIFICANT EVENTS / STUDIES:  1/19 Admitted and intubated, starting on IV diuresis  1/21 2D-echo EF 45-50%  1/23 Losartan started 1/24 AKI  LINES / TUBES: 1/20 ETT >> 1/20 OG >> 1/20 Triple lumen LIJ >> 1/20 Urethral catheter >>  CULTURES: 1/21 MRSA >>  ANTIBIOTICS: None  SUBJECTIVE: Pt currently on vett. Yesterday with 1.7 urine output and net negative 12.8 L since admission Was oliguric yesterday with no residual urine. Pt given lasix with no response then given 1 L NS with 160 cc output. Currently on 40% FIO2 and 8 of PEEP with SpO2 100%. Last night hypoxic to 84%. BP stable with CVP 7.   VITAL SIGNS: Temp:  [98.1 F (36.7 C)-98.8 F (37.1 C)] 98.8 F (37.1 C) (01/24 0700) Pulse Rate:  [64-106] 64 (01/24 0700) Resp:  [14-23] 14 (01/23 1202) BP: (79-154)/(43-109) 105/64 mmHg (01/24 0700) SpO2:  [84 %-100 %] 100 % (01/24 0700) FiO2 (%):  [40 %] 40 % (01/24 0313) HEMODYNAMICS: CVP:  [7 mmHg-20 mmHg] 7 mmHg VENTILATOR SETTINGS: Vent Mode:  [-] PRVC FiO2 (%):  [40 %] 40 % Set Rate:  [16 bmp] 16 bmp Vt Set:  [550 mL] 550 mL PEEP:  [8 cmH20] 8 cmH20 Pressure Support:  [8 cmH20] 8 cmH20 Plateau Pressure:  [24 cmH20-25 cmH20] 24 cmH20 INTAKE / OUTPUT: Intake/Output      01/23 0701 - 01/24 0700 01/24 0701 - 01/25 0700   I.V. (mL/kg) 746.3 (4)    NG/GT 1270    IV Piggyback 250    Total Intake(mL/kg) 2266.3 (12.1)    Urine (mL/kg/hr) 1725 (0.4)    Total Output 1725     Net +541.3            PHYSICAL EXAMINATION: General:  Morbidly obese, NAD Neuro:  Alert, opens eyes, nods to questions, follows  commands  HEENT:  ETT with OT. Conjunctiva normal with no erythema of eyes Cardiovascular: Normal rate and rhythm, distant heart sounds.  Lungs: Coarse /ronchi in anterior fields  Abdomen:  Soft, non-tender, non-distended, decreased BS Musculoskeletal: Right BKA. +1 LE edema   Skin: Tattoos, no rash  LABS:  CBC  Recent Labs Lab 02/27/15 0307 02/28/15 0442 03/01/15 0400  WBC 11.1* 11.2* 13.8*  HGB 13.2 14.1 13.4  HCT 46.2 49.2 47.5  PLT 189 206 204   Coag's  Recent Labs Lab 02/27/15 2215 02/28/15 0500 03/01/15 0400  APTT 69* 92* 49*   BMET  Recent Labs Lab 02/27/15 0307 02/28/15 0442 03/01/15 0400  NA 147* 151* 149*  K 3.1* 3.3* 3.6  CL 94* 98* 96*  CO2 43* 43* 38*  BUN 14 20 42*  CREATININE 0.97 1.01 3.63*  GLUCOSE 121* 123* 116*   Electrolytes  Recent Labs Lab 02/27/15 0307 02/28/15 0442 03/01/15 0400  CALCIUM 9.1 9.7 9.7  MG 2.5* 2.4 2.5*  PHOS 4.0 4.6 5.2*   Sepsis Markers No results for input(s): LATICACIDVEN, PROCALCITON, O2SATVEN in the last 168 hours. ABG  Recent Labs Lab 02/25/15 1056 02/25/15 1720 02/26/15 0305  PHART 7.373 7.342* 7.433  PCO2ART 74.6* 90.8* 66.6*  PO2ART 45.7* 153.0* 76.5*   Liver Enzymes  Recent Labs Lab 03/01/15 0400  AST 57*  ALT 82*  ALKPHOS 64  BILITOT 1.0  ALBUMIN 3.6   Cardiac Enzymes  Recent Labs Lab 02/25/15 0345 02/25/15 1013  TROPONINI 0.06* 0.09*   Glucose  Recent Labs Lab 02/28/15 0427 02/28/15 0726 02/28/15 1147 02/28/15 1610 02/28/15 2004 03/01/15 0333  GLUCAP 127* 113* 104* 116* 114* 103*    Imaging Dg Chest Port 1 View  03/01/2015  CLINICAL DATA:  Hypoxia EXAM: PORTABLE CHEST 1 VIEW COMPARISON:  February 28, 2015 FINDINGS: Endotracheal tube tip is 3.7 cm above the carina. Central catheter tip is in the superior vena cava. Nasogastric tube tip and side port below the diaphragm. No pneumothorax. There remains mild generalized interstitial edema. No airspace consolidation.  There is stable cardiomegaly. There is mild pulmonary venous hypertension. No adenopathy. IMPRESSION: Evidence of a degree of congestive heart failure, unchanged. Tube and catheter positions as described without pneumothorax. No airspace consolidation appreciable. Electronically Signed   By: Lowella Grip III M.D.   On: 03/01/2015 07:02   Dg Chest Port 1 View  02/28/2015  CLINICAL DATA:  Chronic CHF, asthma, acute respiratory failure, intubated patient. EXAM: PORTABLE CHEST 1 VIEW COMPARISON:  Portable chest x-ray of February 27, 2015 FINDINGS: The lungs are adequately inflated. The interstitial markings remain increased bilaterally. There is no alveolar infiltrate. There is no pleural effusion or pneumothorax. The cardiac silhouette remains enlarged. The pulmonary vascularity remains mildly engorged. The endotracheal tube tip lies approximately 4.5 cm above the carina. The esophagogastric tube tip projects below the inferior margin of the image. The left internal jugular venous catheter tip projects over the midportion of the SVC. The bony thorax exhibits no acute abnormality. IMPRESSION: Intubated patient with stable positioning of the support tubes. Mild interstitial edema, stable. Electronically Signed   By: David  Martinique M.D.   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  History of COPD of unclear severity Tobacco abuse  P:   Full vent settings Wean PEEP/FIO2 for SpO2 >92% Hopefully extubate today  Stop IV lasix 80 mg TID in setting of AKI Lovenox per pharmacy   Albuterol nebulizer Q2 hr PRN  Counsel on tobacco cessation   CARDIOVASCULAR A: Acute on chronic combined CHF (EF 45-50% on 1/21) - net -12.8L Hypertension Hyperlipidemia  P:  Cardiology following   Stop IV lasix 80 mg TID in setting of AKI Stop losartan 25 mg daily  Monitor daily weights & strict I/O's PO diltiazem 120 mg daily   RENAL A: Non-oliguric AKI - Cr 1.01 to 3.63 most likely due to starting losartan yesterday vs error? 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 P:  Lovenox per pharmacy , dc with clearance Hep drip  INFECTIOUS A:  Leukocytosis - uptrended P:   Monitor for health-care acquired infection Monitor CBC and fever curve  ENDOCRINE A:  Hyperglycemia  Newly diagnosed Type 2 DM - A1c 6.5 on 02/25/15 P:   SSI for goal CBG <180 Needs outpatient follow-up DM consult once PO intake   NEUROLOGIC A:  Phantom pain  Chronic back pain  Anxiety  P:   Wean fentanyl infusion Xanax 0.5 mg TID PRN Trazodone 50 mg daily at bedtime  Gabapentin 600 mg BID PT as tolerated    Juluis Mire MD PGY-3 IMTS Pager: [phone]    03/01/2015, 7:35 AM    STAFF NOTE: Linwood Dibbles, MD FACP have personally reviewed patient's available data, including medical history, events of note, physical examination and test results as part of my evaluation. I have discussed with resident/NP and other care providers such as pharmacist, RN and RRT. In addition, I personally evaluated patient and elicited key findings of: awake, less coarse, less edema, pcxr may be maxed lasix affect and volume down, cvp 7 with presumed pulm HTN, allow pos balance today , d5w to 50 , bmet now and in am, weaning cpap 5 ps5 , goal 30 min , likely extubation, neurostatus supports extubation, bladder scan not helpful, dc lasix, dc arb, no  acei, dc lovenox with arf, add heparin drip, extubation is goal then NIMV nocturnal The patient is critically ill with multiple organ systems failure and requires high complexity decision making for assessment and support, frequent evaluation and titration of therapies, application of advanced monitoring technologies and extensive interpretation of multiple databases.   Critical Care Time devoted to patient care services described in this note is30 Minutes. This time reflects time of care of this signee: Merrie Roof, MD FACP. This critical care time does not reflect procedure time, or teaching time or supervisory time of PA/NP/Med student/Med Resident etc but could involve care discussion time. Rest per NP/medical resident whose note is outlined above and that I agree with   Lavon Paganini. Titus Mould, MD, [place] Pgr: [facility] 03/01/2015 9:26 AM

## 2015-03-01 NOTE — Progress Notes (Signed)
Pt had no urine out put during my shift. Problem discussed earlier on with MD. Interventions ordered were carried out. Order given for a bolus of saline at this time. Will continue to monitor

## 2015-03-01 NOTE — Progress Notes (Signed)
[facility] ADULT ICU REPLACEMENT PROTOCOL FOR AM LAB REPLACEMENT ONLY  The patient does not apply for the [facility] Adult ICU Electrolyte Replacment Protocol based on the criteria listed below:    Is GFR >/= 40 ml/min? No.  Patient's GFR today is 19   Abnormal electrolyte(s): K3.6   If a panic level lab has been reported, has the CCM MD in charge been notified? Yes.  .   Physician:  Levada Schilling, MD  [facility] 03/01/2015 5:11 AM

## 2015-03-01 NOTE — Progress Notes (Signed)
eLink Physician-Brief Progress Note Patient Name: Dean Bowers DOB: Oct 06, 1970 MRN: [id number]   Date of Service  03/01/2015  HPI/Events of Note  Oliguria - CVP = 7 and BP = 79/43.  eICU Interventions  Will order: 1. Hold Lasix. 2. 0.9 NaCl 1 liter IV over 1 hour now.      Intervention Category Intermediate Interventions: Oliguria - evaluation and management  Sommer,Steven Eugene 03/01/2015, 5:53 AM

## 2015-03-01 NOTE — Procedures (Signed)
Extubation Procedure Note  Patient Details:   Name: Dean Bowers DOB: Oct 12, 1970 MRN: [id number]   Airway Documentation:    Positive cuff leak, on 4L [place], able to follow commands. RN at bedside.  Evaluation  O2 sats: stable throughout Complications: No apparent complications Patient did tolerate procedure well. Bilateral Breath Sounds: Diminished, Rhonchi Suctioning: Oral Yes  Dean Bowers 03/01/2015, 9:48 AM

## 2015-03-01 NOTE — Progress Notes (Addendum)
eLink Physician-Brief Progress Note Patient Name: AVETIS CASTER DOB: Jun 09, 1970 MRN: [id number]   Date of Service  03/01/2015  HPI/Events of Note  Oliguria - no urine output since start of Nursing shift. Bladder Scan  = 0 residual. CVP = 10. Given Lasix 80 mg IV at 10 PM. History of diastolic heart failure with LVEF 45% - 50%  eICU Interventions  Will order: 1. Lasix 80 mg IV now.      Intervention Category Intermediate Interventions: Oliguria - evaluation and management  Sommer,Steven Eugene 03/01/2015, 1:17 AM

## 2015-03-01 NOTE — Progress Notes (Addendum)
ANTICOAGULATION CONSULT NOTE - Follow Up Consult  Pharmacy Consult for Heparin Indication: pulmonary embolus and DVT history  Allergies  Allergen Reactions  . Lyrica [Pregabalin] Other (See Comments)    Hallucinations     Patient Measurements: Height: 5\' 9"  (175.3 cm) Weight: (!) 400 lb (181.439 kg) (bed stating 400 Lbs, may be inaccurate per previous RN) IBW/kg (Calculated) : 70.7 Heparin Dosing Weight: 118 kg  Vital Signs: Temp: 99.9 F (37.7 C) (01/24 1600) Temp Source: Axillary (01/24 1600) BP: 121/74 mmHg (01/24 1500) Pulse Rate: 93 (01/24 1500)  Labs:  Recent Labs  02/26/15 1953  02/27/15 0307  02/28/15 0442 02/28/15 0500 03/01/15 0400 03/01/15 0944 03/01/15 0945  HGB  --   < > 13.2  --  14.1  --  13.4  --   --   HCT  --   --  46.2  --  49.2  --  47.5  --   --   PLT  --   --  189  --  206  --  204  --   --   APTT  --   < >  --   < >  --  92* 49*  --  51*  HEPARINUNFRC 0.84*  --   --   --   --  0.47  --  1.03*  --   CREATININE  --   < > 0.97  --  1.01  --  3.63* 2.78*  --   < > = values in this interval not displayed.   03/01/15 1517 LMWH anti-Xa level = 0.76  Estimated Creatinine Clearance: 54.6 mL/min (by C-G formula based on Cr of 2.78).   Medications:  Scheduled:  . antiseptic oral rinse  7 mL Mouth Rinse 10 times per day  . chlorhexidine gluconate  15 mL Mouth Rinse BID  . diltiazem  30 mg Oral 4 times per day  . feeding supplement (PRO-STAT SUGAR FREE 64)  30 mL Per Tube TID  . feeding supplement (VITAL HIGH PROTEIN)  1,000 mL Per Tube Q24H  . gabapentin  600 mg Oral BID  . insulin aspart  0-15 Units Subcutaneous TID AC & HS  . pantoprazole (PROTONIX) IV  40 mg Intravenous Q24H  . sodium chloride  10-40 mL Intracatheter Q12H  . sodium chloride  3 mL Intravenous Q12H  . traZODone  50 mg Oral QHS   Infusions:  . dextrose 50 mL/hr at 03/01/15 0932  . fentaNYL infusion INTRAVENOUS Stopped (03/01/15 0030)    Assessment: 45 yo M on Xarelto PTA  for hx DVT and PE.  Xarelto was held on admission and transitioned to IV heparin, then to treatment dose Lovenox to reduce fluid volume.  Overnight the patient has experienced AKI with SCr 0.9 >> 3.6 >> 2.7.  Lovenox is no longer appropriate in this setting and patient to be transitioned back to IV heparin.  Pharmacy has been checking random anti-Xa levels as it is unknown the actual amount of drug clearance with his AKI.  Will need to restart heparin when anti-Xa level is <0.3.  Currently 0.76.  Goal of Therapy:  Heparin level 0.3-0.7 units/ml Monitor platelets by anticoagulation protocol: Yes   Plan:  No heparin at this time. Recheck anti-Xa level at 2100.  [employer], Pharm.D., BCPS Clinical Pharmacist Pager [phone] 03/01/2015 4:45 PM   Addendum:  Level this PM remains therapeutic at 0.51.  Will hold off on starting heparin at this time.  Next level with AM labs.  Anticipate will be able to start heparin in the morning.  [employer], Pharm.D., BCPS Clinical Pharmacist Pager [phone] 03/01/2015 10:00 PM

## 2015-03-01 NOTE — Progress Notes (Addendum)
ANTICOAGULATION CONSULT NOTE - Initial Consult  Pharmacy Consult for Heparin Indication: Hx PE  Allergies  Allergen Reactions  . Lyrica [Pregabalin] Other (See Comments)    Hallucinations     Patient Measurements: Height: 5\' 9"  (175.3 cm) Weight: (!) 400 lb (181.439 kg) (bed stating 400 Lbs, may be inaccurate per previous RN) IBW/kg (Calculated) : 70.7  Heparin dosing weight: 118 kg  Vital Signs: Temp: 98.3 F (36.8 C) (01/24 1300) Temp Source: Oral (01/24 1300) BP: 122/74 mmHg (01/24 1300) Pulse Rate: 94 (01/24 1300)  Labs:  Recent Labs  02/26/15 1953  02/27/15 0307  02/28/15 0442 02/28/15 0500 03/01/15 0400 03/01/15 0944 03/01/15 0945  HGB  --   < > 13.2  --  14.1  --  13.4  --   --   HCT  --   --  46.2  --  49.2  --  47.5  --   --   PLT  --   --  189  --  206  --  204  --   --   APTT  --   < >  --   < >  --  92* 49*  --  51*  HEPARINUNFRC 0.84*  --   --   --   --  0.47  --  1.03*  --   CREATININE  --   < > 0.97  --  1.01  --  3.63* 2.78*  --   < > = values in this interval not displayed.  Estimated Creatinine Clearance: 54.6 mL/min (by C-G formula based on Cr of 2.78).  Assessment: 51 YOM who presented on 1/20 with worsening dyspnea, LLE edema, and fluid retention. This required intubation on admit. PTA, the patient was on Xarelto for hx PE which was transitioned to Heparin IV, then SQ lovenox (to eliminated excess volume), now with current AKI. Pharmacy consulted to transition patient back to heparin gtt. No bleeding noted.  CBC stable, SCr 3.63>2.78, Estimated CrCl ~45-55 ml/min Anti-Xa level 1.03, aPTT 51 (not coordinating with Anti-Xa, will use anti-Xa for dosing)  Goal of Therapy:  Monitor platelets by anticoagulation protocol: Yes   Plan:  Stop Lovenox Follow Anti-Xa level to determine when to start heparin gtt (start when Anti-Xa <0.3) Recheck Anti-Xa at 1500 Monitor renal fxn, CBC, and S/sx of bleeding   Darl Pikes, PharmD Clinical  Pharmacist- Resident Pager: [phone] 03/01/2015,2:18 PM

## 2015-03-01 NOTE — Progress Notes (Signed)
Upon entering room, witnessed patient chewing on ice chips from ice packs at bedside. Patient tolerating ice chips and asking for diet order. PCCM aware, orders given for diet. Will continue to monitor.
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
## 2015-03-02 ENCOUNTER — Inpatient Hospital Stay (HOSPITAL_COMMUNITY): Payer: 59

## 2015-03-02 LAB — CBC
HCT: 45.7 % (ref 39.0–52.0)
HEMOGLOBIN: 13 g/dL (ref 13.0–17.0)
MCH: 22 pg — ABNORMAL LOW (ref 26.0–34.0)
MCHC: 28.4 g/dL — AB (ref 30.0–36.0)
MCV: 77.3 fL — ABNORMAL LOW (ref 78.0–100.0)
PLATELETS: 195 10*3/uL (ref 150–400)
RBC: 5.91 MIL/uL — ABNORMAL HIGH (ref 4.22–5.81)
RDW: 18.2 % — AB (ref 11.5–15.5)
WBC: 10 10*3/uL (ref 4.0–10.5)

## 2015-03-02 LAB — GLUCOSE, CAPILLARY
GLUCOSE-CAPILLARY: 101 mg/dL — AB (ref 65–99)
Glucose-Capillary: 161 mg/dL — ABNORMAL HIGH (ref 65–99)
Glucose-Capillary: 144 mg/dL — ABNORMAL HIGH (ref 65–99)
Glucose-Capillary: 136 mg/dL — ABNORMAL HIGH (ref 65–99)
Glucose-Capillary: 113 mg/dL — ABNORMAL HIGH (ref 65–99)

## 2015-03-02 LAB — RENAL FUNCTION PANEL
ALBUMIN: 3.4 g/dL — AB (ref 3.5–5.0)
Anion gap: 18 — ABNORMAL HIGH (ref 5–15)
BUN: 41 mg/dL — AB (ref 6–20)
CALCIUM: 8.5 mg/dL — AB (ref 8.9–10.3)
CO2: 33 mmol/L — ABNORMAL HIGH (ref 22–32)
CREATININE: 2.4 mg/dL — AB (ref 0.61–1.24)
Chloride: 87 mmol/L — ABNORMAL LOW (ref 101–111)
GFR calc Af Amer: 36 mL/min — ABNORMAL LOW (ref >60)
GFR calc non Af Amer: 31 mL/min — ABNORMAL LOW (ref >60)
GLUCOSE: 127 mg/dL — AB (ref 65–99)
PHOSPHORUS: 4.7 mg/dL — AB (ref 2.5–4.6)
Potassium: 3.3 mmol/L — ABNORMAL LOW (ref 3.5–5.1)
SODIUM: 138 mmol/L (ref 135–145)

## 2015-03-02 LAB — MAGNESIUM: MAGNESIUM: 2.6 mg/dL — AB (ref 1.7–2.4)

## 2015-03-02 LAB — HEPARIN LEVEL (UNFRACTIONATED): Heparin Unfractionated: 0.25 IU/mL — ABNORMAL LOW (ref 0.30–0.70)

## 2015-03-02 MED ORDER — HEPARIN (PORCINE) IN NACL 100-0.45 UNIT/ML-% IJ SOLN
2000.0000 [IU]/h | INTRAMUSCULAR | Status: DC
  Administered 2015-03-02: 2000 [IU]/h via INTRAVENOUS
  Filled 2015-03-02: qty 250

## 2015-03-02 MED ORDER — RIVAROXABAN 20 MG PO TABS
20.0000 mg | ORAL_TABLET | Freq: Every day | ORAL | Status: DC
  Administered 2015-03-02: 20 mg via ORAL
  Filled 2015-03-02: qty 1

## 2015-03-02 MED ORDER — RIVAROXABAN 20 MG PO TABS
20.0000 mg | ORAL_TABLET | Freq: Every day | ORAL | Status: DC
  Administered 2015-03-03: 20 mg via ORAL
  Filled 2015-03-02: qty 1

## 2015-03-02 MED ORDER — POTASSIUM CHLORIDE 10 MEQ/50ML IV SOLN
10.0000 meq | INTRAVENOUS | Status: AC
  Administered 2015-03-02 (×4): 10 meq via INTRAVENOUS
  Filled 2015-03-02 (×4): qty 50

## 2015-03-02 MED ORDER — CETYLPYRIDINIUM CHLORIDE 0.05 % MT LIQD
7.0000 mL | Freq: Two times a day (BID) | OROMUCOSAL | Status: DC
  Administered 2015-03-02: 7 mL via OROMUCOSAL

## 2015-03-02 NOTE — Evaluation (Signed)
Physical Therapy Evaluation Patient Details Name: Dean Bowers MRN: [id number] DOB: [birth year] Today's Date: 03/02/2015   History of Present Illness  Dean Bowers is a 45 y.o. 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   Clinical Impression  Pt admitted with above diagnosis. Pt currently with functional limitations due to the deficits listed below (see PT Problem List). Pt will benefit from energy conservation techniques.  Will follow acutely.   Pt will benefit from skilled PT to increase their independence and safety with mobility to allow discharge to the venue listed below.      Follow Up Recommendations Home health PT;Supervision - Intermittent (HHOT)    Equipment Recommendations  None recommended by PT    Recommendations for Other Services       Precautions / Restrictions Precautions Precautions: None Required Braces or Orthoses: Other Brace/Splint (right LE prosthesis) Restrictions Weight Bearing Restrictions: No      Mobility  Bed Mobility Overal bed mobility: Independent                Transfers Overall transfer level: Independent               General transfer comment: Unable to assess further than OOB due to nursing in room to get IV.  ROM assessment and bed mobility only.   Ambulation/Gait             General Gait Details: Per nurse, pt ambulated 50 feet with steady gait.  Desat to 83% on RA.    Stairs            Wheelchair Mobility    Modified Rankin (Stroke Patients Only)       Balance                                             Pertinent Vitals/Pain Pain Assessment: No/denies pain  VSS during  session but does desat with activity. Home O2 at night PTA and may need for activity.      Home Living Family/patient expects to be discharged to:: Private residence Living Arrangements: Spouse/significant other Available Help at Discharge: Family;Available PRN/intermittently Type of Home: House Home Access: Ramped entrance     Home Layout: One level Home Equipment: Shower seat - built in;Wheelchair - power;Walker - 2 wheels;Crutches;Bedside commode (pulse oximeter) Additional Comments: wife states they moved to home with wider doors and is fully handicapped accessible.     Prior Function Level of Independence: Independent         Comments: per wife, uses electric wheelchair for mobility; ambulating household distances with crutches      Hand Dominance   Dominant Hand: Right    Extremity/Trunk Assessment   Upper Extremity Assessment: Defer to OT evaluation           Lower Extremity Assessment: Overall WFL for tasks assessed      Cervical / Trunk Assessment: Normal  Communication   Communication: No difficulties  Cognition Arousal/Alertness: Awake/alert Behavior During Therapy: Flat affect Overall Cognitive Status: Within Functional Limits for tasks assessed  General Comments General comments (skin integrity, edema, etc.): Gave Energy conservation handout to pt and wife.  Pt has had issues with SOB with activity for some time which limits him.  Feel adapting these items may help improve pts function.     Exercises        Assessment/Plan    PT Assessment Patient needs continued PT services  PT Diagnosis Generalized weakness   PT Problem List Decreased activity tolerance;Decreased balance;Decreased mobility;Decreased knowledge of use of DME;Decreased safety awareness;Decreased knowledge of precautions  PT Treatment Interventions DME instruction;Gait training;Functional mobility training;Therapeutic activities;Therapeutic  exercise;Patient/family education;Balance training   PT Goals (Current goals can be found in the Care Plan section) Acute Rehab PT Goals Patient Stated Goal: to get better PT Goal Formulation: With patient Time For Goal Achievement: 03/16/15 Potential to Achieve Goals: Good    Frequency Min 3X/week   Barriers to discharge Decreased caregiver support wife works    Co-evaluation               End of [place] During Treatment: Oxygen Activity Tolerance: Patient limited by fatigue Patient left: in bed;with call bell/phone within reach;with nursing/sitter in room           Time: 1227-1235 PT Time Calculation (min) (ACUTE ONLY): 8 min   Charges:   PT Evaluation $PT Eval Low Complexity: 1 Procedure     PT G CodesDenice Paradise 03/27/15, 2:28 PM Cleva Camero [facility] Acute Rehabilitation [phone] (pager)

## 2015-03-02 NOTE — Progress Notes (Signed)
PULMONARY / CRITICAL CARE MEDICINE   Name: Dean Bowers MRN: [id number] DOB: 04-Feb-1971    ADMISSION DATE:  02/24/2015 CONSULTATION DATE:  02/25/15  REFERRING MD :  [facility] PRIMARY SERVICE: PCCM   BRIEF PATIENT DESCRIPTION: 67-yo M with acute on chronic hypercapnic/hypoxic respiratory failure requiring intubation in setting of acute on chronic CHF and OSA/OHS.   SIGNIFICANT EVENTS / STUDIES:  1/19 Admitted and intubated, starting on IV diuresis  1/21 2D-echo EF 45-50%  1/23 Losartan started 1/24 AKI, losartan and lasix stopped, onD5W, extubated  LINES / TUBES: 1/20 ETT >>1/24 1/20 OG >>1/24 1/20 Triple lumen LIJ >>1/25 1/20 Urethral catheter >>1/24  CULTURES: 1/21 MRSA >>neg  ANTIBIOTICS: None  SUBJECTIVE:  No acute events overnight. Denies any complaints. Pt extubated yesterday, currently with SpO2 of 96% on 2L. Wore CPAP last night. BP stable. Pt's renal function improving with holding of lasix and losartan and gentle fluids.    VITAL SIGNS: Temp:  [97.6 F (36.4 C)-99.9 F (37.7 C)] 98.1 F (36.7 C) (01/25 0000) Pulse Rate:  [67-99] 85 (01/25 0400) Resp:  [13-31] 18 (01/25 0500) BP: (88-129)/(26-118) 122/31 mmHg (01/25 0500) SpO2:  [89 %-100 %] 96 % (01/25 0500) FiO2 (%):  [30 %-40 %] 30 % (01/24 2246) Weight:  [400 lb (181.439 kg)] 400 lb (181.439 kg) (01/24 1000) HEMODYNAMICS: CVP:  [9 mmHg-10 mmHg] 10 mmHg VENTILATOR SETTINGS: Vent Mode:  [-] PSV FiO2 (%):  [30 %-40 %] 30 % Set Rate:  [16 bmp] 16 bmp Vt Set:  [550 mL] 550 mL PEEP:  [5 cmH20-8 cmH20] 5 cmH20 Pressure Support:  [5 cmH20] 5 cmH20 Plateau Pressure:  [29 cmH20] 29 cmH20 INTAKE / OUTPUT: Intake/Output      01/24 0701 - 01/25 0700 01/25 0701 - 01/26 0700   P.O. 2920    I.V. (mL/kg) 1099.3 (6.1)    NG/GT 140    IV Piggyback     Total Intake(mL/kg) 4159.3 (22.9)    Urine (mL/kg/hr) 2085 (0.5)    Stool 0 (0)    Total Output 2085     Net +2074.3          Stool Occurrence 1 x       PHYSICAL EXAMINATION: General:  Morbidly obese, NAD Neuro:  A & O x3 HEENT:  Normocephalic Cardiovascular: Normal rate and rhythm, distant heart sounds  Lungs:  Intermittent expiratory wheezing, decreased BS at bases  Abdomen:  Soft, non-tender, non-distended, normal BS Musculoskeletal: Right BKA. trace LE edema   Skin: Tattoos, no rash  LABS:  CBC  Recent Labs Lab 02/28/15 0442 03/01/15 0400 03/02/15 0517  WBC 11.2* 13.8* 10.0  HGB 14.1 13.4 13.0  HCT 49.2 47.5 45.7  PLT 206 204 195   Coag's  Recent Labs Lab 02/28/15 0500 03/01/15 0400 03/01/15 0945  APTT 92* 49* 51*   BMET  Recent Labs Lab 03/01/15 0400 03/01/15 0944 03/02/15 0513  NA 149* 148* 138  K 3.6 3.4* 3.3*  CL 96* 97* 87*  CO2 38* 36* 33*  BUN 42* 44* 41*  CREATININE 3.63* 2.78* 2.40*  GLUCOSE 116* 117* 127*   Electrolytes  Recent Labs Lab 02/28/15 0442 03/01/15 0400 03/01/15 0944 03/02/15 0513  CALCIUM 9.7 9.7 9.5 8.5*  MG 2.4 2.5*  --  2.6*  PHOS 4.6 5.2*  --  4.7*   Sepsis Markers No results for input(s): LATICACIDVEN, PROCALCITON, O2SATVEN in the last 168 hours. ABG  Recent Labs Lab 02/25/15 1056 02/25/15 1720 02/26/15 0305  PHART 7.373 7.342*  7.433  PCO2ART 74.6* 90.8* 66.6*  PO2ART 45.7* 153.0* 76.5*   Liver Enzymes  Recent Labs Lab 03/01/15 0400 03/02/15 0513  AST 57*  --   ALT 82*  --   ALKPHOS 64  --   BILITOT 1.0  --   ALBUMIN 3.6 3.4*   Cardiac Enzymes  Recent Labs Lab 02/25/15 0345 02/25/15 1013  TROPONINI 0.06* 0.09*   Glucose  Recent Labs Lab 03/01/15 0017 03/01/15 0333 03/01/15 0736 03/01/15 1322 03/01/15 1612 03/01/15 2105  GLUCAP 101* 103* 106* 191* 106* 114*    Imaging Dg Chest Port 1 View  03/02/2015  CLINICAL DATA:  Congestive heart failure. EXAM: PORTABLE CHEST 1 VIEW COMPARISON:  03/01/2015. FINDINGS: Interim extubation and removal of NG tube. Left IJ line in stable position. Stable cardiomegaly. Low lung volumes with mild  bibasilar atelectasis. No pleural effusion or pneumothorax. IMPRESSION: 1. Left IJ line in stable position. 2. Stable cardiomegaly. 3. Low lung volumes with mild bibasilar atelectasis. Electronically Signed   By: Marcello Moores  Register   On: 03/02/2015 07:03   Dg Chest Port 1 View  03/01/2015  CLINICAL DATA:  Hypoxia EXAM: PORTABLE CHEST 1 VIEW COMPARISON:  February 28, 2015 FINDINGS: Endotracheal tube tip is 3.7 cm above the carina. Central catheter tip is in the superior vena cava. Nasogastric tube tip and side port below the diaphragm. No pneumothorax. There remains mild generalized interstitial edema. No airspace consolidation. There is stable cardiomegaly. There is mild pulmonary venous hypertension. No adenopathy. IMPRESSION: Evidence of a degree of congestive heart failure, unchanged. Tube and catheter positions as described without pneumothorax. No airspace consolidation appreciable. Electronically Signed   By: Lowella Grip III M.D.   On: 03/01/2015 07:02     CXR: above   ASSESSMENT / PLAN:  PULMONARY A: Acute on chronic hypercapnic/hypoxic respiratory failure requiring ventilatory support - extubated on 1/24 Pulmonary edema - resolved Mild bibasilar atelectasis  OSA/OHS History of PE on chronic AC therapy  History of COPD of unclear severity Tobacco abuse  P:   Oxygen therapy to keep SpO2 >92%  CPAP as tolerated at night IV heparin per pharmacy  To dc will limit pos balance see heme Albuterol nebulizer Q2 hr PRN  Counsel on tobacco cessation  CARDIOVASCULAR A: Acute on chronic combined CHF (EF 45-50% on 1/21) - resolved net -10.7L Hypertension Hyperlipidemia  P:  Cardiology following  Holding lasix and losartan in setting of AKI PO diltiazem 120 mg daily  Monitor daily weights & strict I/O's  RENAL A: Non-oliguric AKI - Cr down to 2.4 from 3.63  Hypokalemia - 3.3 Hyperphosphatemia in setting of AKI  Hypernatremia - resolved  P:   Holding lasix and losartan in setting  of AKI Stop D5W 30 mL/hr  Monitor BMP and UOP Avoid nephrotoxins   GASTROINTESTINAL A:  Nutrition Morbid obesity GI PPx P:   Heart healthy  D/C IV protonix 40 mg daily   HEMATOLOGIC A:  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 P:  Heparin drip per pharmacy to dc Consider NOAC when crt tolerates   INFECTIOUS A:  Leukocytosis - resolved P:   Monitor for health-care acquired infection   ENDOCRINE A:  Hyperglycemia  Newly diagnosed Type 2 DM - A1c 6.5 on 02/25/15 P:   SSI for goal CBG <180 Needs outpatient follow-up DM consult for counseling   NEUROLOGIC A:  Phantom pain  Chronic back pain  Anxiety  P:   Xanax 0.5 mg TID PRN Trazodone 50 mg daily  at bedtime  Gabapentin 600 mg BID PT   Transfer out of ICU today   Juluis Mire MD PGY-3 IMTS Pager: [phone]    03/02/2015, 7:22 AM    STAFF NOTE: Linwood Dibbles, MD FACP have personally reviewed patient's available data, including medical history, events of note, physical examination and test results as part of my evaluation. I have discussed with resident/NP and other care providers such as pharmacist, RN and RRT. In addition, I personally evaluated patient and elicited key findings of: MAC DADDY is in the chair, extubated day prior, no distress, pcxr not with overt edema, used cpap well baseline, crt improving with pos balance and dc lasix, chem in am needed, no ARB further, dc line neck, hep dc as crt clearance allows noac, to [facility], med [employer]. Titus Mould, MD, [place] Pgr: [facility] 03/02/2015 11:38 AM

## 2015-03-02 NOTE — Progress Notes (Signed)
   03/02/15 2336  BiPAP/CPAP/SIPAP  BiPAP/CPAP/SIPAP Pt Type Adult  Mask Type Full face mask  Mask Size Large  Set Rate 0 breaths/min  Respiratory Rate 18 breaths/min  IPAP 25 cmH20  EPAP 12 cmH2O  Oxygen Percent 32 %  Flow Rate 3 lpm  BiPAP/CPAP/SIPAP BiPAP  Patient Home Equipment No  Auto Titrate No (Patient's home mask)  BiPAP/CPAP /SiPAP Vitals  Pulse Rate 98  Resp 18  Bilateral Breath Sounds Clear;Diminished  Placed patient on BIPAP 25/12 with 3L O2 bleed in per home settings.

## 2015-03-02 NOTE — Care Management Important Message (Signed)
Important Message  Patient Details  Name: KOMARI PRZYWARA MRN: [id number] Date of Birth: [birth year]   Medicare Important Message Given:  Yes    Khyrin Trevathan P Rochell Mabie 03/02/2015, 11:06 AM

## 2015-03-02 NOTE — Progress Notes (Signed)
   03/02/15 2204  BiPAP/CPAP/SIPAP  BiPAP/CPAP/SIPAP Pt Type Adult  Mask Type Full face mask  Mask Size Large  Set Rate 0 breaths/min  Respiratory Rate 18 breaths/min  IPAP 12 cmH20  EPAP 12 cmH2O  Oxygen Percent 32 %  Flow Rate 3 lpm  BiPAP/CPAP/SIPAP CPAP  Patient Home Equipment No (Except patient has home mask)  Auto Titrate No  BiPAP/CPAP /SiPAP Vitals  Pulse Rate 98  Resp 18  Placed patient on CPAP for a short time to assess comfort. He said he feels fine and he will put it back on by himself later.
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
## 2015-03-02 NOTE — Progress Notes (Addendum)
Patient desats to 86% on room air while sitting in chair. 2 Liters nasal cannula applied, sats increased to 94%. Ambulated patient out into hallway while on room air. Patient desats to as low as 83%. Patient repositioned in chair and 2 liters nasal cannula applied.

## 2015-03-02 NOTE — Progress Notes (Signed)
Unable to obtain accurate CVP measurements, Rabbani, MD aware.

## 2015-03-02 NOTE — Progress Notes (Signed)
Nutrition Follow-up  DOCUMENTATION CODES:   Morbid obesity  INTERVENTION:   Heart Healthy Diet education, encouraged follow up with [facility]  NUTRITION DIAGNOSIS:   Inadequate oral intake related to inability to eat as evidenced by NPO status. Resolved  GOAL:   Patient will meet greater than or equal to 90% of their needs Met.   MONITOR:   PO intake  ASSESSMENT:   Dean Bowers is a 45 y.o. 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  Pt and wife in room and pt reports that he needs to lose weight and has trouble with swelling.   Lipid Panel     Component Value Date/Time   CHOL 227* 01/11/2015 1650   TRIG 222.0* 01/11/2015 1650   HDL 44.00 01/11/2015 1650   CHOLHDL 5 01/11/2015 1650   VLDL 44.4* 01/11/2015 1650   LDLCALC 82 05/27/2013 1140    RD provided "Heart Healthy Nutrition Therapy" handout from the Academy of [employer]. Reviewed patient's dietary recall. Provided examples on ways to decrease sodium and fat intake in diet. Discouraged intake of processed foods and use of salt shaker. Encouraged fresh fruits and vegetables as well as whole grain sources of carbohydrates to maximize fiber intake. Teach back method used.  Expect fair compliance.   Diet Order:  Diet Heart Room service appropriate?: Yes; Fluid consistency:: Thin  Skin:  Reviewed, no issues  Last BM:  1/24  Height:   Ht Readings from Last 1 Encounters:  02/25/15 '5\' 9"'$  (1.753 m)   Weight:   Wt Readings from Last 1 Encounters:  03/01/15 400 lb (181.439 kg)   Ideal Body Weight:  71.4 kg  BMI:  Body mass index is 59.04 kg/(m^2).  Estimated Nutritional Needs:    Kcal:  1900-2100  Protein:  100-115 grams  Fluid:  2 L/day  EDUCATION NEEDS:   Education needs addressed  Maylon Peppers RD, [place], [place] Pager [phone] After Hours Pager

## 2015-03-02 NOTE — Progress Notes (Signed)
Received from [facility] . Alert and oriented, independent not in any distress, on room air. Will continue to monitor.

## 2015-03-02 NOTE — Discharge Instructions (Addendum)
Information on my medicine - XARELTO (Rivaroxaban)  This medication education was reviewed with me or my healthcare representative as part of my discharge preparation.  The pharmacist that spoke with me during my hospital stay was:  Darl Pikes, [facility]  Why was Xarelto prescribed for you? Xarelto was prescribed for you to reduce the risk of blood clots forming after orthopedic surgery. The medical term for these abnormal blood clots is venous thromboembolism (VTE).  What do you need to know about xarelto ? Take your Xarelto ONCE DAILY at the same time every day. You may take it either with food.  If you have difficulty swallowing the tablet whole, you may crush it and mix in applesauce just prior to taking your dose.  Take Xarelto exactly as prescribed by your doctor and DO NOT stop taking Xarelto without talking to the doctor who prescribed the medication.  Stopping without other VTE prevention medication to take the place of Xarelto may increase your risk of developing a clot.  After discharge, you should have regular check-up appointments with your healthcare provider that is prescribing your Xarelto.    What do you do if you miss a dose? If you miss a dose, take it as soon as you remember on the same day then continue your regularly scheduled once daily regimen the next day. Do not take two doses of Xarelto on the same day.   Important Safety Information A possible side effect of Xarelto is bleeding. You should call your healthcare provider right away if you experience any of the following: ? Bleeding from an injury or your nose that does not stop. ? Unusual colored urine (red or dark brown) or unusual colored stools (red or black). ? Unusual bruising for unknown reasons. ? A serious fall or if you hit your head (even if there is no bleeding).  Some medicines may interact with Xarelto and might increase your risk of bleeding while on Xarelto. To help avoid this, consult  your healthcare provider or pharmacist prior to using any new prescription or non-prescription medications, including herbals, vitamins, non-steroidal anti-inflammatory drugs (NSAIDs) and supplements.  This website has more information on Xarelto: [email or web address] --------------------------------------------------------------------------------------------------------------------------   [facility] ( [employer] ) to arrange home health PT,OT,RN and call you with agency name [phone]  Ref number [id number]

## 2015-03-02 NOTE — Progress Notes (Addendum)
Inpatient Diabetes Program Recommendations  AACE/ADA: New Consensus Statement on Inpatient Glycemic Control (2015)  Target Ranges:  Prepandial:   less than 140 mg/dL      Peak postprandial:   less than 180 mg/dL (1-2 hours)      Critically ill patients:  140 - 180 mg/dL   Review of Glycemic Control Results for Dean Bowers, Dean Bowers (MRN [id number]) as of 03/02/2015 08:47  Ref. Range 02/25/2015 10:13  Hemoglobin A1C Latest Ref Range: 4.8-5.6 % 6.5 (H)   Diabetes history: Per PMH pt was diagnosed with DM last year 02/2014.  Diabetes Coordinator met with patient when diagnosed last year to discuss new diagnosis 02/11/14 (see note).   Outpatient Diabetes medications: none noted Current orders for Inpatient glycemic control: Novolog correction mod scale Will meet with patient to discuss DM management since diagnosis last year.   Will encourage lifestyle modification. Addendum: spoke with pt and gave handout for diabetes meal planning.  Discussed basic carb counting and monitoring CBGs.   Thank you  Raoul Pitch BSN, RN,CDE Inpatient Diabetes Coordinator [phone] (team pager)

## 2015-03-02 NOTE — Progress Notes (Signed)
ANTICOAGULATION CONSULT NOTE - Follow Up Consult  Pharmacy Consult for Heparin  Indication: Hx PE  Allergies  Allergen Reactions  . Lyrica [Pregabalin] Other (See Comments)    Hallucinations     Patient Measurements: Height: 5\' 9"  (175.3 cm) Weight: (!) 400 lb (181.439 kg) (bed stating 400 Lbs, may be inaccurate per previous RN) IBW/kg (Calculated) : 70.7  Vital Signs: Temp: 98.1 F (36.7 C) (01/25 0000) Temp Source: Oral (01/25 0000) BP: 128/43 mmHg (01/25 0100) Pulse Rate: 72 (01/25 0100)  Labs:  Recent Labs  02/28/15 0442  02/28/15 0500 03/01/15 0400 03/01/15 0944 03/01/15 0945 03/01/15 2100 03/02/15 0513 03/02/15 0517  HGB 14.1  --   --  13.4  --   --   --   --  13.0  HCT 49.2  --   --  47.5  --   --   --   --  45.7  PLT 206  --   --  204  --   --   --   --  195  APTT  --   --  92* 49*  --  51*  --   --   --   HEPARINUNFRC  --   < > 0.47  --  1.03*  --  0.51 0.25*  --   CREATININE 1.01  --   --  3.63* 2.78*  --   --   --   --   < > = values in this interval not displayed.  Estimated Creatinine Clearance: 54.6 mL/min (by C-G formula based on Cr of 2.78). Assessment: 60 YOM who presented on 1/20 with worsening dyspnea, LLE edema, and fluid retention. This required intubation on admit. PTA, the patient was on Xarelto for hx PE which was transitioned to Heparin IV and eventually switched to Lovenox. Due to acute renal failure, switching back to Heparin. Anti-Xa level this AM is <0.3, so will start heparin now  Goal of Therapy:  Heparin level 0.3-0.7 units/ml aPTT 66-102 seconds Monitor platelets by anticoagulation protocol: Yes   Plan:  -Start heparin drip at 2000 units/hr -1400 HL  Amiayah Giebel 03/02/2015,5:39 AM

## 2015-03-02 NOTE — Progress Notes (Signed)
Pt has home concentrator and etanks for when power goes out. Pt needs more lightweight portable oxygen system. Orders written and fill fax to [facility].

## 2015-03-02 NOTE — Progress Notes (Signed)
[facility] ADULT ICU REPLACEMENT PROTOCOL FOR AM LAB REPLACEMENT ONLY  The patient does not apply for the [facility] Adult ICU Electrolyte Replacment Protocol based on the criteria listed below:    Is GFR >/= 40 ml/min? No.  Patient's GFR today is 31  Abnormal electrolyte(s): K3.3  If a panic level lab has been reported, has the CCM MD in charge been notified? Yes.  .   Physician:  S Sommer,MD  Vear Clock 03/02/2015 6:24 AM

## 2015-03-03 LAB — RENAL FUNCTION PANEL
ALBUMIN: 3.3 g/dL — AB (ref 3.5–5.0)
ANION GAP: 11 (ref 5–15)
BUN: 24 mg/dL — ABNORMAL HIGH (ref 6–20)
CO2: 32 mmol/L (ref 22–32)
Calcium: 9.6 mg/dL (ref 8.9–10.3)
Chloride: 96 mmol/L — ABNORMAL LOW (ref 101–111)
Creatinine, Ser: 1.12 mg/dL (ref 0.61–1.24)
GFR calc Af Amer: >60 mL/min (ref >60)
GFR calc non Af Amer: >60 mL/min (ref >60)
GLUCOSE: 102 mg/dL — AB (ref 65–99)
PHOSPHORUS: 2.4 mg/dL — AB (ref 2.5–4.6)
POTASSIUM: 3.9 mmol/L (ref 3.5–5.1)
Sodium: 139 mmol/L (ref 135–145)

## 2015-03-03 LAB — CBC
HEMATOCRIT: 46.1 % (ref 39.0–52.0)
HEMOGLOBIN: 13.4 g/dL (ref 13.0–17.0)
MCH: 22.4 pg — AB (ref 26.0–34.0)
MCHC: 29.1 g/dL — AB (ref 30.0–36.0)
MCV: 77.2 fL — ABNORMAL LOW (ref 78.0–100.0)
Platelets: 200 10*3/uL (ref 150–400)
RBC: 5.97 MIL/uL — ABNORMAL HIGH (ref 4.22–5.81)
RDW: 17.8 % — ABNORMAL HIGH (ref 11.5–15.5)
WBC: 9.1 10*3/uL (ref 4.0–10.5)

## 2015-03-03 LAB — GLUCOSE, CAPILLARY: Glucose-Capillary: 118 mg/dL — ABNORMAL HIGH (ref 65–99)

## 2015-03-03 NOTE — Progress Notes (Signed)
   Cardiologist: Dr. Ellyn Hack  No new cardiac recommendations. Consider restart Torsemide (home med) Will sign off. Please call if any questions.   Candee Furbish, MD

## 2015-03-03 NOTE — Care Management Note (Signed)
Case Management Note  Patient Details  Name: Dean Bowers MRN: [id number] Date of Birth: [birth year]  Subjective/Objective:                    Action/Plan: Faxed MD signed Oxygen Conserving Device Prescription to [facility] [id number] , confirmed receipt. Patient and wife aware.  Confirmed patient's face sheet information   Orders for home health PT,OT, RN , patient's insurance requires CM go through [facility] [phone] , fax [phone] to arrange home health .   Call [facility] and faxed required information /orders . [facility] will call patient directly to notify him of home health agency name .   Patient and wife aware and voiced understanding. NCM gave patient [facility] phone number and his ref number [id number]  Expected Discharge Date:                  Expected Discharge Plan:  [place]  In-House Referral:  NA  Discharge planning Services  CM Consult  Post Acute Care Choice:  Home Health Choice offered to:  Patient, Spouse  DME Arranged:  Oxygen DME Agency:  [place]  HH Arranged:  OT, PT, RN [place] Agency:     Status of Service:  Completed, signed off  Medicare Important Message Given:  Yes Date Medicare IM Given:    Medicare IM give by:    Date Additional Medicare IM Given:    Additional Medicare Important Message give by:     If discussed at [place] of Stay Meetings, dates discussed:    Additional Comments:  Marilu Favre, RN 03/03/2015, 11:22 AM

## 2015-03-03 NOTE — Progress Notes (Signed)
Lanette Hampshire to be D/C'd home with home health per MD order. Discussed with the patient and all questions fully answered.  VSS, Skin clean, dry and intact without evidence of skin break down, no evidence of skin tears noted.  IV catheter discontinued intact. Site without signs and symptoms of complications. Dressing and pressure applied.  An After Visit Summary was printed and given to the patient. Discussed heart failure education.    D/c education completed with patient/family including follow up instructions, medication list, d/c activities limitations if indicated, with other d/c instructions as indicated by MD - patient able to verbalize understanding, all questions fully answered.   Patient instructed to return to ED, call 911, or call MD for any changes in condition.   Patient to be escorted via [place], and D/C home via private auto.

## 2015-03-03 NOTE — H&P (Signed)
Physician Discharge Summary  Dean Bowers [id number] DOB: 01/19/1971 DOA: 02/24/2015  PCP: [name], MD  Admit date: 02/24/2015 Discharge date: 03/03/2015  Time spent: 35 minutes  Recommendations for Outpatient Follow-up:  1. Follow-up PCP in 2 weeks 2. Follow-up cardiology in 4 weeks   Discharge Diagnoses:  Principal Problem:   Acute respiratory failure with hypoxia and hypercarbia (HCC) Active Problems:   Morbid obesity ([place])   Hyperlipidemia associated with type 2 diabetes mellitus (HCC)   Asthma, chronic   Polysubstance abuse   Essential hypertension   OSA (obstructive sleep apnea)   Anticoagulation adequate, on xarelto   Right heart failure (HCC)   Pulmonary embolus, left (HCC) - history of   Acute on chronic diastolic (congestive) heart failure (HCC)   Respiratory failure (HCC)   CHF exacerbation (HCC)   Acute respiratory failure with hypoxia (HCC)   Anasarca   Acute on chronic combined systolic and diastolic heart failure ([place])   Obesity hypoventilation syndrome ([place])   Discharge Condition: Stable  Diet recommendation: low-salt diet  Filed Weights   02/28/15 0600 03/01/15 1000 03/03/15 0647  Weight: 186.882 kg (412 lb) 181.439 kg (400 lb) 179.624 kg (396 lb)    History of present illness:  Dean Bowers is a 45 y.o. 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) of on Xarelto, COPD, OSA/OHS on CPAP, right BKD, hypoventilation syndrome, HTN, ongoing tobacco smoking and Marijuana abuse who presented to [facility] ED 02/24/15 evening for worsening sob and LE edema  Hospital Course:  SIGNIFICANT EVENTS / STUDIES:  1/19 Admitted and intubated, starting on IV diuresis  1/21 2D-echo EF 45-50%  1/23 Losartan started 1/24 AKI  LINES / TUBES: 1/20 ETT >> 1/20 OG >> 1/20 Triple lumen LIJ >> 1/20 Urethral catheter >>  CULTURES: 1/21 MRSA >>  Acute on chronic  hypercapnic/hypoxic respiratory failure- patient required ventilatory support due to acute on chronic combined CHF. Patient was aggressively diuresed with IV Lasix. Extubated, breathing has improved. Cardiology followed the patient for management of acute CHF exacerbation. After patient was improved, diuretics were held for worsening renal function.  Acute kidney injury Patient has worsening acute kidney injury with creatinine of 3.63. Lasix was held, and today creatinine is back to normal 1.12. Will restart torsemide as per cardiology recommendation.  History of DVT/PE Patient is on Xarelto, was started on heparin drip which has been discontinued. Will restart Xarelto  Diabetes mellitus  patient's hemoglobin A1c was 6.5, blood glucose is well controlled now 102 this morning. Will need to follow-up with PCP for further management.  Chronic pain syndrome Continue gabapentin, trazodone, Xanax when necessary    Procedures: SIGNIFICANT EVENTS / STUDIES:  1/19 Admitted and intubated, starting on IV diuresis  1/21 2D-echo EF 45-50%  1/23 Losartan started 1/24 AKI  LINES / TUBES: 1/20 ETT >> 1/20 OG >> 1/20 Triple lumen LIJ >> 1/20 Urethral catheter >>  CULTURES: 1/21 MRSA >>  Consultations:  Cardiology  PCCM took care of the patient from 02/24/2015 to 03/02/15. Hospitalist service assumed care on 03/03/2015  Discharge Exam: Filed Vitals:   03/02/15 2336 03/03/15 0616  BP:  132/74  Pulse: 98 91  Temp:  98.2 F (36.8 C)  Resp: 18 18    General: appears in no acute distress Cardiovascular:  S1-S2 regular Respiratory:  clear to auscultation bilaterally  Discharge Instructions   Discharge Instructions    Diet - low  sodium heart healthy    Complete by:  As directed      Increase activity slowly    Complete by:  As directed           Current Discharge Medication List    CONTINUE these medications which have NOT CHANGED   Details  ALPRAZolam (XANAX) 0.5 MG  tablet Take 1 tablet (0.5 mg total) by mouth 3 (three) times daily as needed. Qty: 30 tablet, Refills: 0   Associated Diagnoses: Chronic back pain    aspirin EC 81 MG EC tablet Take 1 tablet (81 mg total) by mouth daily.    CARTIA XT 120 MG 24 hr capsule Take 1 tablet by mouth daily.  Refills: 11    diclofenac (VOLTAREN) 75 MG EC tablet TAKE 1 TABLET(75 MG) BY MOUTH TWICE DAILY As needed for pain Qty: 60 tablet, Refills: 5   Associated Diagnoses: Chronic back pain    gabapentin (NEURONTIN) 600 MG tablet TAKE 1 TABLET BY MOUTH TWICE DAILY Qty: 60 tablet, Refills: 5    Multiple Vitamins-Minerals (MULTIVITAMIN WITH MINERALS) tablet Take 1 tablet by mouth daily.    NON FORMULARY at bedtime. trilogy noninvasive ventilation    oxyCODONE-acetaminophen (ROXICET) 5-325 MG tablet Take 1 tablet by mouth 2 (two) times daily as needed for severe pain. Qty: 45 tablet, Refills: 0    OXYGEN Inhale 3 L into the lungs at bedtime.    Potassium Chloride ER 20 MEQ TBCR Take 20 mEq by mouth daily. Qty: 30 tablet, Refills: 11    PROVENTIL HFA 108 (90 BASE) MCG/ACT inhaler INHALE 2 PUFFS BY MOUTH EVERY 6 HOURS AS NEEDED FOR WHEEZING OR SHORTNESS OF BREATH Qty: 6.7 g, Refills: 5    sildenafil (REVATIO) 20 MG tablet TAKE 1 OR 2 TABLETS BY MOUTH AS NEEDED Qty: 30 tablet, Refills: 1    Testosterone (ANDROGEL PUMP) 20.25 MG/ACT (1.62%) GEL Place 4 application onto the skin daily. 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 Qty: 30 tablet, Refills: 2    XARELTO 20 MG TABS tablet TAKE 1 TABLET BY MOUTH DAILY WITH DINNER Qty: 30 tablet, Refills: 5    Elastic Bandages & Supports (MEDICAL COMPRESSION SOCKS) MISC 2 each by Does not apply  route daily. Qty: 2 each, Refills: 5       Allergies  Allergen Reactions  . Lyrica [Pregabalin] Other (See Comments)    Hallucinations       The results of significant diagnostics from this hospitalization (including imaging, microbiology, ancillary and laboratory) are listed below for reference.    Significant Diagnostic Studies: Dg Chest 2 View  02/24/2015  CLINICAL DATA:  Shortness of breath. EXAM: CHEST  2 VIEW COMPARISON:  April 15, 2014 FINDINGS: Mild cardiomegaly is identified. The hila and mediastinum are unchanged. No pneumothorax. No pulmonary nodules or masses. Mild atelectasis in left lung base. Increased interstitial markings consistent with pulmonary venous congestion without overt edema. Mild streaky opacity over the heart on the lateral view may simply represent atelectasis. IMPRESSION: Mild pulmonary venous congestion. No acute abnormalities identified. Electronically Signed   By: Dorise Bullion III M.D   On: 02/24/2015 16:12   Dg Abd 1 View  02/25/2015  CLINICAL DATA:  Recent gastric catheter placement EXAM: ABDOMEN - 1 VIEW COMPARISON:  None. FINDINGS: Gastric catheter extends into the distal aspect of the stomach. Postsurgical changes are seen. No free air is noted. IMPRESSION: Gastric catheter within the distal stomach. Electronically Signed   By: Inez Catalina M.D.   On: 02/25/2015 15:38   Dg Chest Port 1 View  03/02/2015  CLINICAL DATA:  Congestive heart failure. EXAM: PORTABLE CHEST 1 VIEW COMPARISON:  03/01/2015. FINDINGS: Interim extubation and removal of NG tube. Left IJ line in stable position. Stable cardiomegaly. Low lung volumes with mild bibasilar atelectasis. No pleural effusion or pneumothorax. IMPRESSION: 1. Left IJ line in stable position. 2. Stable cardiomegaly. 3. Low lung volumes with mild bibasilar atelectasis. Electronically Signed   By: Marcello Moores  Register   On: 03/02/2015 07:03   Dg Chest Port 1 View  03/01/2015  CLINICAL DATA:  Hypoxia EXAM: PORTABLE  CHEST 1 VIEW COMPARISON:  February 28, 2015 FINDINGS: Endotracheal tube tip is 3.7 cm above the carina. Central catheter tip is in the superior vena cava. Nasogastric tube tip and side port below the diaphragm. No pneumothorax. There remains mild generalized interstitial edema. No airspace consolidation. There is stable cardiomegaly. There is mild pulmonary venous hypertension. No adenopathy. IMPRESSION: Evidence of a degree of congestive heart failure, unchanged. Tube and catheter positions as described without pneumothorax. No airspace consolidation appreciable. Electronically Signed   By: Lowella Grip III M.D.   On: 03/01/2015 07:02   Dg Chest Port 1 View  02/28/2015  CLINICAL DATA:  Chronic CHF, asthma, acute respiratory failure, intubated patient. EXAM: PORTABLE CHEST 1 VIEW COMPARISON:  Portable chest x-ray of February 27, 2015 FINDINGS: The lungs are adequately inflated. The interstitial markings remain increased bilaterally. There is no alveolar infiltrate. There is no pleural effusion or pneumothorax. The cardiac silhouette remains enlarged. The pulmonary vascularity remains mildly engorged. The endotracheal tube tip lies approximately 4.5 cm above the carina. The esophagogastric tube tip projects below the inferior margin of the image. The left internal jugular venous catheter tip projects over the midportion of the SVC. The bony thorax exhibits no acute abnormality. IMPRESSION: Intubated patient with stable positioning of the support tubes. Mild interstitial edema, stable. Electronically Signed   By: David  Martinique M.D.   On: 02/28/2015 07:08   Dg Chest Port 1 View  02/27/2015  CLINICAL DATA:  Intubation. EXAM: PORTABLE CHEST 1 VIEW COMPARISON:  02/26/2015 FINDINGS: Enteric catheter, endotracheal tube, left IJ approach central venous catheter stable. Cardiomediastinal silhouette is stably enlarged. There is no evidence of focal airspace consolidation, pleural effusion or pneumothorax. There is  interstitial pulmonary edema. Osseous structures are without acute abnormality. Soft tissues are grossly normal. IMPRESSION: Stably enlarged cardiac silhouette with interstitial pulmonary edema. Stable support apparatus. Electronically Signed   By: Fidela Salisbury M.D.   On: 02/27/2015 08:52   Dg Chest Port 1 View  02/26/2015  CLINICAL DATA:  Check endotracheal tube placement EXAM: PORTABLE CHEST - 1 VIEW COMPARISON:  02/25/2015 FINDINGS: Endotracheal tube is again noted 4.5 cm above the carina in satisfactory position. A nasogastric catheter is noted within the stomach. Left jugular central line again is seen in the proximal superior vena cava. The cardiac shadow remains enlarged. Mild vascular congestion is again seen. No focal confluent infiltrate is noted. IMPRESSION: Stable central vascular congestion. Tubes and lines stable in appearance. Electronically Signed   By: Inez Catalina M.D.   On: 02/26/2015 07:26   Dg Chest [facility] [address]  02/25/2015  CLINICAL DATA:  Encounter for endotracheal tube placement. EXAM: PORTABLE CHEST 1 VIEW COMPARISON:  Same day. FINDINGS: Stable cardiomegaly. Nasogastric tube is seen entering stomach. Endotracheal tube is seen projected over tracheal air shadow with distal tip 4 cm above the carina. Stable mild central pulmonary vascular congestion is noted. No pneumothorax or significant pleural effusion is noted. Bony thorax is unremarkable. Interval placement of left internal jugular catheter with distal tip in expected position of SVC. IMPRESSION: Endotracheal and nasogastric tubes are in grossly good position. Interval placement of left internal jugular catheter with distal tip in expected position of the SVC. No pneumothorax is noted. Electronically Signed   By: Marijo Conception, M.D.   On: 02/25/2015 15:38   Dg Chest Port 1 View  02/25/2015  CLINICAL DATA:  Short of breath EXAM: PORTABLE CHEST 1 VIEW COMPARISON:  300 hours FINDINGS: Moderate cardiomegaly. Mild vascular  congestion. No pulmonary edema. No pneumothorax. IMPRESSION: Cardiomegaly and vascular congestion are stable. Electronically Signed   By: Marybelle Killings M.D.   On: 02/25/2015 07:40   Dg Chest Port 1 View  02/25/2015  CLINICAL DATA:  Acute onset of respiratory failure and hypoxia. Severe shortness of breath. Initial encounter. EXAM: PORTABLE CHEST 1 VIEW COMPARISON:  Chest radiograph performed 02/24/2015 FINDINGS: The lungs are well-aerated. Vascular congestion is noted. Bibasilar airspace opacities may reflect mild interstitial edema or possibly pneumonia. There is no evidence of pleural effusion or pneumothorax. The cardiomediastinal silhouette is mildly enlarged. No acute osseous abnormalities are seen. IMPRESSION: Vascular congestion and mild cardiomegaly. Bibasilar airspace opacities may reflect mild interstitial edema or possibly pneumonia. Electronically Signed   By: Garald Balding M.D.   On: 02/25/2015 03:22    Microbiology: Recent Results (from the past 240 hour(s))  MRSA PCR Screening     Status: None   Collection Time: 02/25/15  5:11 AM  Result Value Ref Range Status   MRSA by PCR NEGATIVE NEGATIVE Final    Comment:        The GeneXpert MRSA Assay (FDA approved for NASAL specimens only), is one component of a comprehensive MRSA colonization surveillance program. It is not intended to diagnose MRSA infection nor to guide or monitor treatment for MRSA infections.      Labs: Basic Metabolic Panel:  Recent Labs Lab 02/26/15 0454 02/27/15 0307 02/28/15 0442 03/01/15 0400 03/01/15 0944 03/02/15 0513 03/03/15 0311  NA 144 147* 151* 149* 148* 138 139  K 3.3* 3.1* 3.3* 3.6 3.4* 3.3* 3.9  CL 92* 94* 98* 96* 97* 87* 96*  CO2 42* 43* 43* 38* 36* 33* 32  GLUCOSE 101* 121* 123* 116* 117* 127* 102*  BUN 11 14 20  42* 44* 41* 24*  CREATININE 0.94 0.97 1.01 3.63* 2.78* 2.40* 1.12  CALCIUM 8.8* 9.1 9.7 9.7 9.5 8.5* 9.6  MG 1.9 2.5* 2.4 2.5*  --  2.6*  --   PHOS 1.8* 4.0 4.6 5.2*   --  4.7* 2.4*   Liver Function Tests:  Recent Labs Lab 03/01/15 0400 03/02/15 0513 03/03/15 0311  AST 57*  --   --   ALT 82*  --   --   ALKPHOS 64  --   --   BILITOT 1.0  --   --   PROT 7.7  --   --   ALBUMIN 3.6 3.4* 3.3*   CBC:  Recent Labs Lab 02/26/15 0454 02/27/15 0307 02/28/15 0442 03/01/15 0400 03/02/15 0517 03/03/15 0311  WBC 10.8* 11.1* 11.2* 13.8* 10.0 9.1  NEUTROABS 8.3*  --   --   --   --   --   HGB 12.4* 13.2 14.1 13.4 13.0 13.4  HCT 44.5 46.2 49.2 47.5 45.7 46.1  MCV 80.0 79.2 79.5 78.9 77.3* 77.2*  PLT 178 189 206 204 195 200   Cardiac Enzymes:  Recent Labs Lab 02/25/15 0345 02/25/15 1013  TROPONINI 0.06* 0.09*   BNP: BNP (last 3 results)  Recent Labs  04/28/14 1919 05/03/14 1813 02/24/15 1525  BNP 371.5* 451.1* 107.4*    ProBNP (last 3 results) No results for input(s): PROBNP in the last 8760 hours.  CBG:  Recent Labs Lab 03/02/15 0757 03/02/15 1125 03/02/15 1739 03/02/15 2212 03/03/15 0715  GLUCAP 161* 144* 136* 113* 118*       Signed:  Oswald Hillock MD.  [facility] 03/03/2015, 11:03 AM

## 2015-03-04 NOTE — Care Management (Signed)
Caryl Pina with [facility] called patient home health has been arranged through [place] , with start date 03-05-15 .   Magdalen Spatz RN BSN

## 2015-03-09 ENCOUNTER — Ambulatory Visit (INDEPENDENT_AMBULATORY_CARE_PROVIDER_SITE_OTHER): Payer: 59 | Admitting: Nurse Practitioner

## 2015-03-09 ENCOUNTER — Encounter: Payer: Self-pay | Admitting: Nurse Practitioner

## 2015-03-09 VITALS — BP 120/60 | HR 108 | Temp 98.3°F | Ht 69.0 in | Wt >= 6400 oz

## 2015-03-09 DIAGNOSIS — J029 Acute pharyngitis, unspecified: Secondary | ICD-10-CM | POA: Diagnosis not present

## 2015-03-09 LAB — POCT RAPID STREP A (OFFICE): Rapid Strep A Screen: POSITIVE — AB

## 2015-03-09 MED ORDER — AZITHROMYCIN 250 MG PO TABS: ORAL_TABLET | ORAL | Status: DC

## 2015-03-09 MED ORDER — PROMETHAZINE-DM 6.25-15 MG/5ML PO SYRP: 5.0000 mL | ORAL_SOLUTION | Freq: Four times a day (QID) | ORAL | Status: DC | PRN
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
## 2015-03-09 NOTE — Discharge Summary (Signed)
Physician Discharge Summary  Dean Bowers [id number] DOB: [birth year] DOA: 02/24/2015  PCP: [name], MD  Admit date: 02/24/2015 Discharge date: 03/03/2015  Time spent: 35 minutes  Recommendations for Outpatient Follow-up:  1. Follow-up PCP in 2 weeks 2. Follow-up cardiology in 4 weeks   Discharge Diagnoses:  Principal Problem:  Acute respiratory failure with hypoxia and hypercarbia (HCC) Active Problems:  Morbid obesity ([place])  Hyperlipidemia associated with type 2 diabetes mellitus (HCC)  Asthma, chronic  Polysubstance abuse  Essential hypertension  OSA (obstructive sleep apnea)  Anticoagulation adequate, on xarelto  Right heart failure (HCC)  Pulmonary embolus, left (HCC) - history of  Acute on chronic diastolic (congestive) heart failure (HCC)  Respiratory failure (HCC)  CHF exacerbation (HCC)  Acute respiratory failure with hypoxia (HCC)  Anasarca  Acute on chronic combined systolic and diastolic heart failure ([place])  Obesity hypoventilation syndrome ([place])   Discharge Condition: Stable  Diet recommendation: low-salt diet  Filed Weights   02/28/15 0600 03/01/15 1000 03/03/15 0647  Weight: 186.882 kg (412 lb) 181.439 kg (400 lb) 179.624 kg (396 lb)    History of present illness:  Dean Bowers is a 45 y.o. 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) of on Xarelto, COPD, OSA/OHS on CPAP, right BKD, hypoventilation syndrome, HTN, ongoing tobacco smoking and Marijuana abuse who presented to [facility] ED 02/24/15 evening for worsening sob and LE edema  Hospital Course:  SIGNIFICANT EVENTS / STUDIES:  1/19 Admitted and intubated, starting on IV diuresis  1/21 2D-echo EF 45-50%  1/23 Losartan started 1/24 AKI  LINES / TUBES: 1/20 ETT >> 1/20 OG >> 1/20 Triple lumen LIJ >> 1/20 Urethral catheter >>  CULTURES: 1/21 MRSA  >>  Acute on chronic hypercapnic/hypoxic respiratory failure- patient required ventilatory support due to acute on chronic combined CHF. Patient was aggressively diuresed with IV Lasix. Extubated, breathing has improved. Cardiology followed the patient for management of acute CHF exacerbation. After patient was improved, diuretics were held for worsening renal function.  Acute kidney injury Patient has worsening acute kidney injury with creatinine of 3.63. Lasix was held, and today creatinine is back to normal 1.12. Will restart torsemide as per cardiology recommendation.  History of DVT/PE Patient is on Xarelto, was started on heparin drip which has been discontinued. Will restart Xarelto  Diabetes mellitus patient's hemoglobin A1c was 6.5, blood glucose is well controlled now 102 this morning. Will need to follow-up with PCP for further management.  Chronic pain syndrome Continue gabapentin, trazodone, Xanax when necessary    Procedures: SIGNIFICANT EVENTS / STUDIES:  1/19 Admitted and intubated, starting on IV diuresis  1/21 2D-echo EF 45-50%  1/23 Losartan started 1/24 AKI  LINES / TUBES: 1/20 ETT >> 1/20 OG >> 1/20 Triple lumen LIJ >> 1/20 Urethral catheter >>  CULTURES: 1/21 MRSA >>  Consultations:  Cardiology  PCCM took care of the patient from 02/24/2015 to 03/02/15. Hospitalist service assumed care on 03/03/2015  Discharge Exam: Filed Vitals:   03/02/15 2336 03/03/15 0616  BP:  132/74  Pulse: 98 91  Temp:  98.2 F (36.8 C)  Resp: 18 18    General: appears in no acute distress Cardiovascular: S1-S2 regular Respiratory: clear to auscultation bilaterally  Discharge Instructions   Discharge Instructions    Diet - low sodium heart healthy  Complete by: As directed      Increase activity slowly  Complete by: As  directed           Current Discharge Medication List    CONTINUE these medications which have  NOT CHANGED   Details  ALPRAZolam (XANAX) 0.5 MG tablet Take 1 tablet (0.5 mg total) by mouth 3 (three) times daily as needed. Qty: 30 tablet, Refills: 0   Associated Diagnoses: Chronic back pain    aspirin EC 81 MG EC tablet Take 1 tablet (81 mg total) by mouth daily.    CARTIA XT 120 MG 24 hr capsule Take 1 tablet by mouth daily.  Refills: 11    diclofenac (VOLTAREN) 75 MG EC tablet TAKE 1 TABLET(75 MG) BY MOUTH TWICE DAILY As needed for pain Qty: 60 tablet, Refills: 5   Associated Diagnoses: Chronic back pain    gabapentin (NEURONTIN) 600 MG tablet TAKE 1 TABLET BY MOUTH TWICE DAILY Qty: 60 tablet, Refills: 5    Multiple Vitamins-Minerals (MULTIVITAMIN WITH MINERALS) tablet Take 1 tablet by mouth daily.    NON FORMULARY at bedtime. trilogy noninvasive ventilation    oxyCODONE-acetaminophen (ROXICET) 5-325 MG tablet Take 1 tablet by mouth 2 (two) times daily as needed for severe pain. Qty: 45 tablet, Refills: 0    OXYGEN Inhale 3 L into the lungs at bedtime.    Potassium Chloride ER 20 MEQ TBCR Take 20 mEq by mouth daily. Qty: 30 tablet, Refills: 11    PROVENTIL HFA 108 (90 BASE) MCG/ACT inhaler INHALE 2 PUFFS BY MOUTH EVERY 6 HOURS AS NEEDED FOR WHEEZING OR SHORTNESS OF BREATH Qty: 6.7 g, Refills: 5    sildenafil (REVATIO) 20 MG tablet TAKE 1 OR 2 TABLETS BY MOUTH AS NEEDED Qty: 30 tablet, Refills: 1    Testosterone (ANDROGEL PUMP) 20.25 MG/ACT (1.62%) GEL Place 4 application onto the skin daily. 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 Qty: 30 tablet, Refills: 2    XARELTO 20 MG TABS tablet TAKE 1 TABLET BY MOUTH DAILY WITH DINNER Qty: 30  tablet, Refills: 5    Elastic Bandages & Supports (MEDICAL COMPRESSION SOCKS) MISC 2 each by Does not apply route daily. Qty: 2 each, Refills: 5       Allergies  Allergen Reactions  . Lyrica [Pregabalin] Other (See Comments)    Hallucinations        The results of significant diagnostics from this hospitalization (including imaging, microbiology, ancillary and laboratory) are listed below for reference.    Significant Diagnostic Studies:  Imaging Results    Dg Chest 2 View  02/24/2015 CLINICAL DATA: Shortness of breath. EXAM: CHEST 2 VIEW COMPARISON: April 15, 2014 FINDINGS: Mild cardiomegaly is identified. The hila and mediastinum are unchanged. No pneumothorax. No pulmonary nodules or masses. Mild atelectasis in left lung base. Increased interstitial markings consistent with pulmonary venous congestion without overt edema. Mild streaky opacity over the heart on the lateral view may simply represent atelectasis. IMPRESSION: Mild pulmonary venous congestion. No acute abnormalities identified. Electronically Signed By: Dorise Bullion III M.D On: 02/24/2015 16:12   Dg Abd 1 View  02/25/2015 CLINICAL DATA: Recent gastric catheter placement EXAM: ABDOMEN - 1 VIEW COMPARISON: None. FINDINGS: Gastric catheter extends into the distal aspect of the stomach. Postsurgical changes are seen. No  free air is noted. IMPRESSION: Gastric catheter within the distal stomach. Electronically Signed By: Inez Catalina M.D. On: 02/25/2015 15:38   Dg Chest Port 1 View  03/02/2015 CLINICAL DATA: Congestive heart failure. EXAM: PORTABLE CHEST 1 VIEW COMPARISON: 03/01/2015. FINDINGS: Interim extubation and removal of NG tube. Left IJ line in stable position. Stable cardiomegaly. Low lung volumes with mild bibasilar atelectasis. No pleural effusion or pneumothorax. IMPRESSION: 1. Left IJ line in stable position. 2. Stable cardiomegaly. 3. Low lung volumes with mild bibasilar  atelectasis. Electronically Signed By: Marcello Moores Register On: 03/02/2015 07:03   Dg Chest Port 1 View  03/01/2015 CLINICAL DATA: Hypoxia EXAM: PORTABLE CHEST 1 VIEW COMPARISON: February 28, 2015 FINDINGS: Endotracheal tube tip is 3.7 cm above the carina. Central catheter tip is in the superior vena cava. Nasogastric tube tip and side port below the diaphragm. No pneumothorax. There remains mild generalized interstitial edema. No airspace consolidation. There is stable cardiomegaly. There is mild pulmonary venous hypertension. No adenopathy. IMPRESSION: Evidence of a degree of congestive heart failure, unchanged. Tube and catheter positions as described without pneumothorax. No airspace consolidation appreciable. Electronically Signed By: Lowella Grip III M.D. On: 03/01/2015 07:02   Dg Chest Port 1 View  02/28/2015 CLINICAL DATA: Chronic CHF, asthma, acute respiratory failure, intubated patient. EXAM: PORTABLE CHEST 1 VIEW COMPARISON: Portable chest x-ray of February 27, 2015 FINDINGS: The lungs are adequately inflated. The interstitial markings remain increased bilaterally. There is no alveolar infiltrate. There is no pleural effusion or pneumothorax. The cardiac silhouette remains enlarged. The pulmonary vascularity remains mildly engorged. The endotracheal tube tip lies approximately 4.5 cm above the carina. The esophagogastric tube tip projects below the inferior margin of the image. The left internal jugular venous catheter tip projects over the midportion of the SVC. The bony thorax exhibits no acute abnormality. IMPRESSION: Intubated patient with stable positioning of the support tubes. Mild interstitial edema, stable. Electronically Signed By: David Martinique M.D. On: 02/28/2015 07:08   Dg Chest Port 1 View  02/27/2015 CLINICAL DATA: Intubation. EXAM: PORTABLE CHEST 1 VIEW COMPARISON: 02/26/2015 FINDINGS: Enteric catheter, endotracheal tube, left IJ approach central venous  catheter stable. Cardiomediastinal silhouette is stably enlarged. There is no evidence of focal airspace consolidation, pleural effusion or pneumothorax. There is interstitial pulmonary edema. Osseous structures are without acute abnormality. Soft tissues are grossly normal. IMPRESSION: Stably enlarged cardiac silhouette with interstitial pulmonary edema. Stable support apparatus. Electronically Signed By: Fidela Salisbury M.D. On: 02/27/2015 08:52   Dg Chest Port 1 View  02/26/2015 CLINICAL DATA: Check endotracheal tube placement EXAM: PORTABLE CHEST - 1 VIEW COMPARISON: 02/25/2015 FINDINGS: Endotracheal tube is again noted 4.5 cm above the carina in satisfactory position. A nasogastric catheter is noted within the stomach. Left jugular central line again is seen in the proximal superior vena cava. The cardiac shadow remains enlarged. Mild vascular congestion is again seen. No focal confluent infiltrate is noted. IMPRESSION: Stable central vascular congestion. Tubes and lines stable in appearance. Electronically Signed By: Inez Catalina M.D. On: 02/26/2015 07:26   Dg Chest Port 1 View  02/25/2015 CLINICAL DATA: Encounter for endotracheal tube placement. EXAM: PORTABLE CHEST 1 VIEW COMPARISON: Same day. FINDINGS: Stable cardiomegaly. Nasogastric tube is seen entering stomach. Endotracheal tube is seen projected over tracheal air shadow with distal tip 4 cm above the carina. Stable mild central pulmonary vascular congestion is noted. No pneumothorax or significant pleural effusion is noted. Bony thorax is unremarkable. Interval placement of left internal jugular catheter with distal tip in expected position of  SVC. IMPRESSION: Endotracheal and nasogastric tubes are in grossly good position. Interval placement of left internal jugular catheter with distal tip in expected position of the SVC. No pneumothorax is noted. Electronically Signed By: Marijo Conception, M.D. On: 02/25/2015 15:38   Dg  Chest Port 1 View  02/25/2015 CLINICAL DATA: Short of breath EXAM: PORTABLE CHEST 1 VIEW COMPARISON: 300 hours FINDINGS: Moderate cardiomegaly. Mild vascular congestion. No pulmonary edema. No pneumothorax. IMPRESSION: Cardiomegaly and vascular congestion are stable. Electronically Signed By: Marybelle Killings M.D. On: 02/25/2015 07:40   Dg Chest Port 1 View  02/25/2015 CLINICAL DATA: Acute onset of respiratory failure and hypoxia. Severe shortness of breath. Initial encounter. EXAM: PORTABLE CHEST 1 VIEW COMPARISON: Chest radiograph performed 02/24/2015 FINDINGS: The lungs are well-aerated. Vascular congestion is noted. Bibasilar airspace opacities may reflect mild interstitial edema or possibly pneumonia. There is no evidence of pleural effusion or pneumothorax. The cardiomediastinal silhouette is mildly enlarged. No acute osseous abnormalities are seen. IMPRESSION: Vascular congestion and mild cardiomegaly. Bibasilar airspace opacities may reflect mild interstitial edema or possibly pneumonia. Electronically Signed By: Garald Balding M.D. On: 02/25/2015 03:22     Microbiology: Recent Results (from the past 240 hour(s))  MRSA PCR Screening Status: None   Collection Time: 02/25/15 5:11 AM  Result Value Ref Range Status   MRSA by PCR NEGATIVE NEGATIVE Final    Comment:   The GeneXpert MRSA Assay (FDA approved for NASAL specimens only), is one component of a comprehensive MRSA colonization surveillance program. It is not intended to diagnose MRSA infection nor to guide or monitor treatment for MRSA infections.      Labs: Basic Metabolic Panel:  Last Labs      Recent Labs Lab 02/26/15 0454 02/27/15 0307 02/28/15 0442 03/01/15 0400 03/01/15 0944 03/02/15 0513 03/03/15 0311  NA 144 147* 151* 149* 148* 138 139  K 3.3* 3.1* 3.3* 3.6 3.4* 3.3* 3.9  CL 92* 94* 98* 96* 97* 87* 96*  CO2 42* 43* 43* 38* 36*  33* 32  GLUCOSE 101* 121* 123* 116* 117* 127* 102*  BUN 11 14 20  42* 44* 41* 24*  CREATININE 0.94 0.97 1.01 3.63* 2.78* 2.40* 1.12  CALCIUM 8.8* 9.1 9.7 9.7 9.5 8.5* 9.6  MG 1.9 2.5* 2.4 2.5* --  2.6* --   PHOS 1.8* 4.0 4.6 5.2* --  4.7* 2.4*     Liver Function Tests:  Last Labs      Recent Labs Lab 03/01/15 0400 03/02/15 0513 03/03/15 0311  AST 57* --  --   ALT 82* --  --   ALKPHOS 64 --  --   BILITOT 1.0 --  --   PROT 7.7 --  --   ALBUMIN 3.6 3.4* 3.3*     CBC:  Last Labs      Recent Labs Lab 02/26/15 0454 02/27/15 0307 02/28/15 0442 03/01/15 0400 03/02/15 0517 03/03/15 0311  WBC 10.8* 11.1* 11.2* 13.8* 10.0 9.1  NEUTROABS 8.3* --  --  --  --  --   HGB 12.4* 13.2 14.1 13.4 13.0 13.4  HCT 44.5 46.2 49.2 47.5 45.7 46.1  MCV 80.0 79.2 79.5 78.9 77.3* 77.2*  PLT 178 189 206 204 195 200     Cardiac Enzymes:  Last Labs      Recent Labs Lab 02/25/15 0345 02/25/15 1013  TROPONINI 0.06* 0.09*     BNP: BNP (last 3 results)  Recent Labs (within last 365 days)     Recent Labs  04/28/14 1919 05/03/14 1813 02/24/15  1525  BNP 371.5* 451.1* 107.4*      ProBNP (last 3 results)  Recent Labs (within last 365 days)    No results for input(s): PROBNP in the last 8760 hours.    CBG:  Last Labs      Recent Labs Lab 03/02/15 0757 03/02/15 1125 03/02/15 1739 03/02/15 2212 03/03/15 0715  GLUCAP 161* 144* 136* 113* 118*         Signed:  Oswald Hillock MD.  [facility] 03/03/2015, 11:03 AM

## 2015-03-09 NOTE — Assessment & Plan Note (Signed)
New onset  After leaving the hospital he has been having this increasingly sore throat.  POCT strep positive  Advised if antibiotics don't help or breathing becomes troublesome- seek emergency care. Pt is VERY high risk for peritonsillar abscess. Discussed this and he is willing to try the oral abx first.

## 2015-03-09 NOTE — Progress Notes (Signed)
Pre visit review using our clinic review tool, if applicable. No additional management support is needed unless otherwise documented below in the visit note. 

## 2015-03-09 NOTE — Patient Instructions (Signed)
Z-pack!  Strep Throat Strep throat is a bacterial infection of the throat. Your health care provider may call the infection tonsillitis or pharyngitis, depending on whether there is swelling in the tonsils or at the back of the throat. Strep throat is most common during the cold months of the year in children who are 48-46 years of age, but it can happen during any season in people of any age. This infection is spread from person to person (contagious) through coughing, sneezing, or close contact. CAUSES Strep throat is caused by the bacteria called Streptococcus pyogenes. RISK FACTORS This condition is more likely to develop in:  People who spend time in crowded places where the infection can spread easily.  People who have close contact with someone who has strep throat. SYMPTOMS Symptoms of this condition include:  Fever or chills.   Redness, swelling, or pain in the tonsils or throat.  Pain or difficulty when swallowing.  White or yellow spots on the tonsils or throat.  Swollen, tender glands in the neck or under the jaw.  Red rash all over the body (rare). DIAGNOSIS This condition is diagnosed by performing a rapid strep test or by taking a swab of your throat (throat culture test). Results from a rapid strep test are usually ready in a few minutes, but throat culture test results are available after one or two days. TREATMENT This condition is treated with antibiotic medicine. HOME CARE INSTRUCTIONS Medicines  Take over-the-counter and prescription medicines only as told by your health care provider.  Take your antibiotic as told by your health care provider. Do not stop taking the antibiotic even if you start to feel better.  Have family members who also have a sore throat or fever tested for strep throat. They may need antibiotics if they have the strep infection. Eating and Drinking  Do not share food, drinking cups, or personal items that could cause the infection to  spread to other people.  If swallowing is difficult, try eating soft foods until your sore throat feels better.  Drink enough fluid to keep your urine clear or pale yellow. General Instructions  Gargle with a salt-water mixture 3-4 times per day or as needed. To make a salt-water mixture, completely dissolve -1 tsp of salt in 1 cup of warm water.  Make sure that all household members wash their hands well.  Get plenty of rest.  Stay home from school or work until you have been taking antibiotics for 24 hours.  Keep all follow-up visits as told by your health care provider. This is important. SEEK MEDICAL CARE IF:  The glands in your neck continue to get bigger.  You develop a rash, cough, or earache.  You cough up a thick liquid that is green, yellow-brown, or bloody.  You have pain or discomfort that does not get better with medicine.  Your problems seem to be getting worse rather than better.  You have a fever. SEEK IMMEDIATE MEDICAL CARE IF:  You have new symptoms, such as vomiting, severe headache, stiff or painful neck, chest pain, or shortness of breath.  You have severe throat pain, drooling, or changes in your voice.  You have swelling of the neck, or the skin on the neck becomes red and tender.  You have signs of dehydration, such as fatigue, dry mouth, and decreased urination.  You become increasingly sleepy, or you cannot wake up completely.  Your joints become red or painful.   This information is not intended  to replace advice given to you by your health care provider. Make sure you discuss any questions you have with your health care provider.   Document Released: 01/20/2000 Document Revised: 10/13/2014 Document Reviewed: 05/17/2014 Elsevier Interactive Patient Education [employer].

## 2015-03-10 ENCOUNTER — Encounter: Payer: Self-pay | Admitting: Nurse Practitioner

## 2015-03-10 ENCOUNTER — Telehealth: Payer: Self-pay | Admitting: Internal Medicine

## 2015-03-10 NOTE — Telephone Encounter (Signed)
Spoke to [place] and gave verbal orders to start on 03/15/14

## 2015-03-10 NOTE — Telephone Encounter (Signed)
Patient was suppose to get home health care on discharge and this did not happen. Has gotten a home health care facility to care for patient to visit on 2/8.  Is requesting nurse, OT and PT.  Is requesting verbal OK to give [facility] an OK.  [phone] - [facility].

## 2015-03-11 ENCOUNTER — Inpatient Hospital Stay (HOSPITAL_COMMUNITY)
Admission: EM | Admit: 2015-03-11 | Discharge: 2015-03-13 | DRG: 153 | Disposition: A | Discharge status: Home / Self Care | Payer: Medicare Other | Attending: Pulmonary Disease | Admitting: Pulmonary Disease

## 2015-03-11 ENCOUNTER — Encounter (HOSPITAL_COMMUNITY): Payer: Self-pay | Admitting: Emergency Medicine

## 2015-03-11 ENCOUNTER — Emergency Department (HOSPITAL_COMMUNITY): Payer: Medicare Other

## 2015-03-11 DIAGNOSIS — I119 Hypertensive heart disease without heart failure: Secondary | ICD-10-CM | POA: Diagnosis present

## 2015-03-11 DIAGNOSIS — Z7982 Long term (current) use of aspirin: Secondary | ICD-10-CM | POA: Diagnosis not present

## 2015-03-11 DIAGNOSIS — E662 Morbid (severe) obesity with alveolar hypoventilation: Secondary | ICD-10-CM | POA: Diagnosis present

## 2015-03-11 DIAGNOSIS — I509 Heart failure, unspecified: Secondary | ICD-10-CM | POA: Diagnosis present

## 2015-03-11 DIAGNOSIS — E876 Hypokalemia: Secondary | ICD-10-CM | POA: Diagnosis present

## 2015-03-11 DIAGNOSIS — Z791 Long term (current) use of non-steroidal anti-inflammatories (NSAID): Secondary | ICD-10-CM

## 2015-03-11 DIAGNOSIS — Z8261 Family history of arthritis: Secondary | ICD-10-CM | POA: Diagnosis not present

## 2015-03-11 DIAGNOSIS — Z825 Family history of asthma and other chronic lower respiratory diseases: Secondary | ICD-10-CM

## 2015-03-11 DIAGNOSIS — J392 Other diseases of pharynx: Secondary | ICD-10-CM | POA: Diagnosis not present

## 2015-03-11 DIAGNOSIS — Z9981 Dependence on supplemental oxygen: Secondary | ICD-10-CM

## 2015-03-11 DIAGNOSIS — Z841 Family history of disorders of kidney and ureter: Secondary | ICD-10-CM | POA: Diagnosis not present

## 2015-03-11 DIAGNOSIS — Z6841 Body Mass Index (BMI) 40.0 and over, adult: Secondary | ICD-10-CM | POA: Diagnosis not present

## 2015-03-11 DIAGNOSIS — Z89511 Acquired absence of right leg below knee: Secondary | ICD-10-CM

## 2015-03-11 DIAGNOSIS — J9611 Chronic respiratory failure with hypoxia: Secondary | ICD-10-CM | POA: Diagnosis present

## 2015-03-11 DIAGNOSIS — Z7901 Long term (current) use of anticoagulants: Secondary | ICD-10-CM | POA: Diagnosis not present

## 2015-03-11 DIAGNOSIS — E873 Alkalosis: Secondary | ICD-10-CM | POA: Diagnosis present

## 2015-03-11 DIAGNOSIS — J9621 Acute and chronic respiratory failure with hypoxia: Secondary | ICD-10-CM | POA: Diagnosis not present

## 2015-03-11 DIAGNOSIS — Z86711 Personal history of pulmonary embolism: Secondary | ICD-10-CM

## 2015-03-11 DIAGNOSIS — J9622 Acute and chronic respiratory failure with hypercapnia: Secondary | ICD-10-CM | POA: Diagnosis not present

## 2015-03-11 DIAGNOSIS — J9612 Chronic respiratory failure with hypercapnia: Secondary | ICD-10-CM | POA: Diagnosis present

## 2015-03-11 DIAGNOSIS — J449 Chronic obstructive pulmonary disease, unspecified: Secondary | ICD-10-CM | POA: Diagnosis present

## 2015-03-11 DIAGNOSIS — Z833 Family history of diabetes mellitus: Secondary | ICD-10-CM

## 2015-03-11 DIAGNOSIS — J36 Peritonsillar abscess: Secondary | ICD-10-CM | POA: Diagnosis present

## 2015-03-11 DIAGNOSIS — Z8042 Family history of malignant neoplasm of prostate: Secondary | ICD-10-CM

## 2015-03-11 DIAGNOSIS — F17210 Nicotine dependence, cigarettes, uncomplicated: Secondary | ICD-10-CM | POA: Diagnosis present

## 2015-03-11 DIAGNOSIS — Z86718 Personal history of other venous thrombosis and embolism: Secondary | ICD-10-CM

## 2015-03-11 DIAGNOSIS — Z8249 Family history of ischemic heart disease and other diseases of the circulatory system: Secondary | ICD-10-CM | POA: Diagnosis not present

## 2015-03-11 DIAGNOSIS — J810 Acute pulmonary edema: Secondary | ICD-10-CM | POA: Diagnosis not present

## 2015-03-11 DIAGNOSIS — J9690 Respiratory failure, unspecified, unspecified whether with hypoxia or hypercapnia: Secondary | ICD-10-CM

## 2015-03-11 DIAGNOSIS — J9601 Acute respiratory failure with hypoxia: Secondary | ICD-10-CM

## 2015-03-11 DIAGNOSIS — J029 Acute pharyngitis, unspecified: Secondary | ICD-10-CM | POA: Diagnosis present

## 2015-03-11 DIAGNOSIS — E785 Hyperlipidemia, unspecified: Secondary | ICD-10-CM | POA: Diagnosis present

## 2015-03-11 LAB — LACTIC ACID, PLASMA
LACTIC ACID, VENOUS: 1.3 mmol/L (ref 0.5–2.0)
Lactic Acid, Venous: 0.9 mmol/L (ref 0.5–2.0)

## 2015-03-11 LAB — CBC WITH DIFFERENTIAL/PLATELET
Basophils Absolute: 0.1 10*3/uL (ref 0.0–0.1)
Basophils Relative: 0 %
Eosinophils Absolute: 0.2 10*3/uL (ref 0.0–0.7)
Eosinophils Relative: 1 %
HEMATOCRIT: 44.9 % (ref 39.0–52.0)
HEMOGLOBIN: 13.1 g/dL (ref 13.0–17.0)
LYMPHS ABS: 1.6 10*3/uL (ref 0.7–4.0)
LYMPHS PCT: 9 %
MCH: 22.7 pg — AB (ref 26.0–34.0)
MCHC: 29.2 g/dL — AB (ref 30.0–36.0)
MCV: 78 fL (ref 78.0–100.0)
MONOS PCT: 7 %
Monocytes Absolute: 1.4 10*3/uL — ABNORMAL HIGH (ref 0.1–1.0)
NEUTROS ABS: 15 10*3/uL — AB (ref 1.7–7.7)
NEUTROS PCT: 83 %
Platelets: 297 10*3/uL (ref 150–400)
RBC: 5.76 MIL/uL (ref 4.22–5.81)
RDW: 19.4 % — ABNORMAL HIGH (ref 11.5–15.5)
WBC: 18.3 10*3/uL — ABNORMAL HIGH (ref 4.0–10.5)

## 2015-03-11 LAB — BASIC METABOLIC PANEL
Anion gap: 11 (ref 5–15)
BUN: 17 mg/dL (ref 6–20)
CHLORIDE: 91 mmol/L — AB (ref 101–111)
CO2: 40 mmol/L — AB (ref 22–32)
CREATININE: 1.04 mg/dL (ref 0.61–1.24)
Calcium: 9.5 mg/dL (ref 8.9–10.3)
GFR calc non Af Amer: >60 mL/min (ref >60)
Glucose, Bld: 139 mg/dL — ABNORMAL HIGH (ref 65–99)
Potassium: 3.8 mmol/L (ref 3.5–5.1)
Sodium: 142 mmol/L (ref 135–145)

## 2015-03-11 LAB — CBC
HEMATOCRIT: 44.5 % (ref 39.0–52.0)
HEMOGLOBIN: 12.8 g/dL — AB (ref 13.0–17.0)
MCH: 22.4 pg — AB (ref 26.0–34.0)
MCHC: 28.8 g/dL — AB (ref 30.0–36.0)
MCV: 77.8 fL — ABNORMAL LOW (ref 78.0–100.0)
Platelets: 294 10*3/uL (ref 150–400)
RBC: 5.72 MIL/uL (ref 4.22–5.81)
RDW: 19.3 % — AB (ref 11.5–15.5)
WBC: 18.7 10*3/uL — ABNORMAL HIGH (ref 4.0–10.5)

## 2015-03-11 LAB — I-STAT ARTERIAL BLOOD GAS, ED
ACID-BASE EXCESS: 12 mmol/L — AB (ref 0.0–2.0)
Bicarbonate: 41.4 mEq/L — ABNORMAL HIGH (ref 20.0–24.0)
O2 SAT: 77 %
PCO2 ART: 70.9 mmHg — AB (ref 35.0–45.0)
PH ART: 7.373 (ref 7.350–7.450)
TCO2: 44 mmol/L (ref 0–100)
pO2, Arterial: 44 mmHg — ABNORMAL LOW (ref 80.0–100.0)

## 2015-03-11 LAB — TYPE AND SCREEN
ABO/RH(D): A POS
ANTIBODY SCREEN: NEGATIVE

## 2015-03-11 LAB — CREATININE, SERUM
Creatinine, Ser: 0.92 mg/dL (ref 0.61–1.24)
GFR calc Af Amer: >60 mL/min (ref >60)

## 2015-03-11 LAB — ABO/RH: ABO/RH(D): A POS

## 2015-03-11 MED ORDER — IOHEXOL 300 MG/ML  SOLN
100.0000 mL | Freq: Once | INTRAMUSCULAR | Status: AC | PRN
  Administered 2015-03-11: 100 mL via INTRAVENOUS

## 2015-03-11 MED ORDER — HEPARIN SODIUM (PORCINE) 5000 UNIT/ML IJ SOLN
5000.0000 [IU] | Freq: Three times a day (TID) | INTRAMUSCULAR | Status: DC
  Filled 2015-03-11 (×3): qty 1

## 2015-03-11 MED ORDER — ASPIRIN 81 MG PO CHEW
81.0000 mg | CHEWABLE_TABLET | Freq: Every day | ORAL | Status: DC
  Administered 2015-03-11: 81 mg via ORAL
  Filled 2015-03-11: qty 1

## 2015-03-11 MED ORDER — LIDOCAINE VISCOUS 2 % MT SOLN
15.0000 mL | Freq: Once | OROMUCOSAL | Status: AC
  Administered 2015-03-11: 15 mL via OROMUCOSAL
  Filled 2015-03-11: qty 15

## 2015-03-11 MED ORDER — SODIUM CHLORIDE 0.9 % IV SOLN: 250.0000 mL | INTRAVENOUS | Status: DC | PRN

## 2015-03-11 MED ORDER — AMPICILLIN-SULBACTAM SODIUM 3 (2-1) G IJ SOLR
3.0000 g | Freq: Four times a day (QID) | INTRAMUSCULAR | Status: DC
  Administered 2015-03-11 – 2015-03-13 (×7): 3 g via INTRAVENOUS
  Filled 2015-03-11 (×11): qty 3

## 2015-03-11 MED ORDER — NICOTINE 7 MG/24HR TD PT24
7.0000 mg | MEDICATED_PATCH | Freq: Every day | TRANSDERMAL | Status: DC
  Administered 2015-03-12 – 2015-03-13 (×2): 7 mg via TRANSDERMAL
  Filled 2015-03-11 (×3): qty 1

## 2015-03-11 MED ORDER — MORPHINE SULFATE (PF) 4 MG/ML IV SOLN
4.0000 mg | Freq: Once | INTRAVENOUS | Status: AC
  Administered 2015-03-11: 4 mg via INTRAVENOUS
  Filled 2015-03-11: qty 1

## 2015-03-11 NOTE — Consult Note (Signed)
ANTICOAGULATION CONSULT NOTE - Initial Consult  Pharmacy Consult for Xarelto Indication: DVT  Allergies  Allergen Reactions  . Lyrica [Pregabalin] Other (See Comments)    Hallucinations     Patient Measurements: Height: 5\' 9"  (175.3 cm) Weight: (!) 413 lb (187.336 kg) IBW/kg (Calculated) : 70.7  Vital Signs: Temp: 98 F (36.7 C) (02/03 1540) Temp Source: Oral (02/03 1540) BP: 144/89 mmHg (02/03 2000) Pulse Rate: 89 (02/03 2000)  Labs:  Recent Labs  03/11/15 1705  HGB 13.1  HCT 44.9  PLT 297  CREATININE 1.04    Estimated Creatinine Clearance: 148.8 mL/min (by C-G formula based on Cr of 1.04).  Assessment: 45 yo male w/ hx of PE/DVT diagnosed in 04/2014 on Xarelto PTA. Pt presents w/ pharyngitis and may go to surgery.  Goal of Therapy:  Monitor platelets by anticoagulation protocol: Yes   Plan:  Hold Xarelto  Restart xarelto 20 mg daily with breakfast when requested by MD F/u surgery plans  Joya San, PharmD Clinical Pharmacy Resident Pager # [phone] 03/11/2015 8:09 PM

## 2015-03-11 NOTE — ED Notes (Signed)
ENT MD unable to complete procedure in ED. Pt to be admitted.
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
## 2015-03-11 NOTE — Progress Notes (Signed)
Pt placed on BiPAP per home settings. 30/15 35% FiO2. Pt tolerating well at this time
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
## 2015-03-11 NOTE — ED Notes (Signed)
Patient transported to CT 
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
## 2015-03-11 NOTE — ED Notes (Signed)
MD stated that pt can be placed on 3L [place] after reviewing ABG results.

## 2015-03-12 DIAGNOSIS — J36 Peritonsillar abscess: Principal | ICD-10-CM

## 2015-03-12 DIAGNOSIS — J810 Acute pulmonary edema: Secondary | ICD-10-CM

## 2015-03-12 DIAGNOSIS — J9601 Acute respiratory failure with hypoxia: Secondary | ICD-10-CM

## 2015-03-12 LAB — CBC WITH DIFFERENTIAL/PLATELET
BASOS PCT: 1 %
Basophils Absolute: 0.1 10*3/uL (ref 0.0–0.1)
EOS ABS: 0.2 10*3/uL (ref 0.0–0.7)
EOS PCT: 1 %
HCT: 44.4 % (ref 39.0–52.0)
Hemoglobin: 13.3 g/dL (ref 13.0–17.0)
Lymphocytes Relative: 9 %
Lymphs Abs: 1.6 10*3/uL (ref 0.7–4.0)
MCH: 22.7 pg — ABNORMAL LOW (ref 26.0–34.0)
MCHC: 30 g/dL (ref 30.0–36.0)
MCV: 75.8 fL — ABNORMAL LOW (ref 78.0–100.0)
MONO ABS: 1.2 10*3/uL — AB (ref 0.1–1.0)
MONOS PCT: 7 %
Neutro Abs: 14.1 10*3/uL — ABNORMAL HIGH (ref 1.7–7.7)
Neutrophils Relative %: 82 %
Platelets: 213 10*3/uL (ref 150–400)
RBC: 5.86 MIL/uL — ABNORMAL HIGH (ref 4.22–5.81)
RDW: 19.5 % — AB (ref 11.5–15.5)
WBC: 17.1 10*3/uL — AB (ref 4.0–10.5)

## 2015-03-12 LAB — APTT
APTT: 52 s — AB (ref 24–37)
aPTT: 27 seconds (ref 24–37)

## 2015-03-12 LAB — MRSA PCR SCREENING: MRSA BY PCR: NEGATIVE

## 2015-03-12 LAB — HEPARIN LEVEL (UNFRACTIONATED): HEPARIN UNFRACTIONATED: 0.76 [IU]/mL — AB (ref 0.30–0.70)

## 2015-03-12 LAB — LACTIC ACID, PLASMA: Lactic Acid, Venous: 0.8 mmol/L (ref 0.5–2.0)

## 2015-03-12 MED ORDER — HEPARIN (PORCINE) IN NACL 100-0.45 UNIT/ML-% IJ SOLN
1700.0000 [IU]/h | INTRAMUSCULAR | Status: AC
  Administered 2015-03-12: 1700 [IU]/h via INTRAVENOUS
  Filled 2015-03-12: qty 250

## 2015-03-12 MED ORDER — FUROSEMIDE 10 MG/ML IJ SOLN
40.0000 mg | Freq: Two times a day (BID) | INTRAMUSCULAR | Status: DC
  Administered 2015-03-12 – 2015-03-13 (×4): 40 mg via INTRAVENOUS
  Filled 2015-03-12 (×6): qty 4

## 2015-03-12 MED ORDER — CHLORHEXIDINE GLUCONATE 0.12 % MT SOLN
5.0000 mL | Freq: Four times a day (QID) | OROMUCOSAL | Status: DC
  Administered 2015-03-12: 5 mL via OROMUCOSAL

## 2015-03-12 NOTE — Progress Notes (Signed)
Pt becoming confused, very aggressive. Will not keep telemetry, o2, or bipap on at this time. Pt refuses to stay in the bed and desats to low 80s, high 70s when attempts to get up. Pt educated and reoriented. Pt still will not keep on gown and still taking off o2 and leads. Pt's sats off o2 are 85-86% on rm air. Pt sitting between side rails on bari-bed and refuses to move. Pt verbally abusive to staff and wife. Pt refuses to wear bipap completely. [place],NP made aware of patient's behavior. States to monitor patient at this time. Will monitor patient closely.

## 2015-03-12 NOTE — Progress Notes (Addendum)
PULMONARY / CRITICAL CARE MEDICINE HISTORY AND PHYSICAL EXAMINATION   Name: Dean Bowers MRN: [id number] DOB: 05-04-1970    ADMISSION DATE:  03/11/2015  PRIMARY SERVICE: PCCM  CHIEF COMPLAINT:  Sore throat  BRIEF PATIENT DESCRIPTION: morbidly obese man recently admitted with OHS (On Trilogy w/ O2 At bedtime) admitted with peritonsillar abscess. Recent admission last month with resp failure on vent w/ CHF decompensation.   SIGNIFICANT EVENTS / STUDIES:  CT scan 2/3 w/ abscess on L peri (or intra-) tonsillar abscess ENT consult 2/3 Dr. 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 He had a recent admission for a CHF/OHS exacerbation requiring intubation. He had presented to an urgent care and had a throat swab (+) for group A strep, but was not treated with PCN. His symptoms worsened, and he presented to the ED. He was intermittently somnolent, and given his significant hypoxia, PCCM was consulted for admission.   SUBJECTIVE:  Sitting up on side of bed, feels throat might be just a little better.  Did not wear BIPAP for long last night. Cant get comfortable.  ENT recommends I/D last pm, pt declined. Reevaluation this am and will decide.   VITAL SIGNS: Temp:  [98 F (36.7 C)] 98 F (36.7 C) (02/03 1540) Pulse Rate:  [66-111] 89 (02/04 0700) Resp:  [15-26] 19 (02/04 0500) BP: (89-172)/(37-134) 144/79 mmHg (02/04 0700) SpO2:  [85 %-98 %] 94 % (02/04 0700) Weight:  [400 lb 12.7 oz (181.8 kg)-413 lb (187.336 kg)] 403 lb 7.1 oz (183 kg) (02/04 0426) HEMODYNAMICS:   VENTILATOR SETTINGS:   INTAKE / OUTPUT: Intake/Output      02/03 0701 - 02/04 0700 02/04 0701 - 02/05 0700   I.V. (mL/kg) 90 (0.5)    IV Piggyback 200    Total Intake(mL/kg) 290 (1.6)    Urine (mL/kg/hr) 2150    Total Output 2150     Net -1860            PHYSICAL EXAMINATION: General:  Obese man, in NAD Neuro:  oriented &4, following commands  HEENT:  Markedly swollen L tonsil, about half of uvula visible. Voice changes. Controlling secretions Neck: Obese, no LAD Cardiovascular:  Heart sounds dual Lungs:  Distant lung sounds Abdomen:  Obese Musculoskeletal:  Amputated RLE Skin:  Many tattoos, no rashes.  LABS:  CBC  Recent Labs Lab 03/11/15 1705 03/11/15 2045 03/12/15 0303  WBC 18.3* 18.7* 17.1*  HGB 13.1 12.8* 13.3  HCT 44.9 44.5 44.4  PLT 297 294 213   Coag's No results for input(s): APTT, INR in the last 168 hours. BMET  Recent Labs Lab 03/11/15 1705 03/11/15 2045  NA 142  --   K 3.8  --   CL 91*  --   CO2 40*  --   BUN 17  --   CREATININE 1.04 0.92  GLUCOSE 139*  --    Electrolytes  Recent Labs Lab 03/11/15 1705  CALCIUM 9.5   Sepsis Markers  Recent Labs Lab 03/11/15 2045 03/11/15 2211 03/12/15 0150  LATICACIDVEN 0.9 1.3 0.8   ABG  Recent Labs Lab 03/11/15 2036  PHART 7.373  PCO2ART 70.9*  PO2ART 44.0*   Liver Enzymes No  results for input(s): AST, ALT, ALKPHOS, BILITOT, ALBUMIN in the last 168 hours. Cardiac Enzymes No results for input(s): TROPONINI, PROBNP in the last 168 hours. Glucose No results for input(s): GLUCAP in the last 168 hours.  Imaging Ct Soft Tissue Neck W Contrast  03/11/2015  CLINICAL DATA:  45 year old male was hospitalized and intubated last week. Sensation of throat closing now. Query abscess along the left neck where a central line had been in place. EXAM: CT NECK WITH CONTRAST TECHNIQUE: Multidetector CT imaging of the neck was performed using the standard protocol following the bolus administration of intravenous contrast. CONTRAST:  157mL OMNIPAQUE IOHEXOL 300 MG/ML  SOLN COMPARISON:  Face and cervical spine CT 05/2012 FINDINGS: Large body habitus. Pharynx  and larynx: The nasopharynx appears within normal limits. There is superior parapharyngeal space stranding greater on the left. The oropharynx is abnormal, with enlarged and indistinct palatine tonsils. There is a 12 mm hypodense area within the left tonsil nearly at midline. There is lesser hypodensity in the right tonsil. Note also that there is a retropharyngeal right ICA which approaches midline and in courses horizontally in the right retro pharynx on series 2, image 44. Subsequently the lower part of the naso pharyngeal airway and upper part of the oropharynx is occluded. There might also be additional suppuration in the more anterior tonsillar pillar on series 2, image 40. The posterior wall of the pharynx is swollen and edematous, greater on the left. There is a lower retropharyngeal space diffusion, but no retropharyngeal space abscess. The lingual tonsil is mildly hypertrophied. The epiglottis and 80 folds also appear mildly thickened. There is motion at the supraglottic airway. The glottis and subglottic trachea appears within normal limits. Salivary glands: Sublingual space, submandibular glands, and parotid glands appear within normal limits. Thyroid: Negative. Lymph nodes: Moderate to severe level 2 lymphadenopathy, greater on the left. Enlarged left level IIa nodes measure up to 24 mm short axis wall those on the right measure up to 19 mm. Lesser, more reactive appearing level 3 lymphadenopathy. Level 1, level 4, and level 5 nodes appear within normal limits. There is a skin marker along the left lateral neck series 2, image 61. No superficial abscess or regional fluid collection is present. There is extensive regional lymphadenopathy. The left IJ appears to remain patent. Vascular: Major vascular structures in the neck and at the skullbase appear patent including the internal jugular veins. Note retropharyngeal course of both carotids, more so the right. Limited intracranial: Negative. Visualized  orbits: Negative. Mastoids and visualized paranasal sinuses: Minor ethmoid sinus mucosal thickening. Skeleton: No obvious odontogenic source of infection identified. Degenerative changes in the cervical spine with reversed lordosis. Severe left C3-C4 facet arthropathy. No acute osseous abnormality identified. Upper chest: Mediastinal lipomatosis. No superior mediastinal lymphadenopathy. Negative lung apices. IMPRESSION: 1. Acute tonsillitis/pharyngitis. Suppuration of the palatine tonsils greater on the left with probable intra tonsillar abscess extending to the midline. See series 2, images 40-44). However note retropharyngeal course of both carotid arteries, especially the right. 2. Mild laryngeal edema. Effaced nasopharyngeal, upper oropharyngeal airway. Retropharyngeal effusion. 3. Moderate to severe bilateral level 2 lymphadenopathy, favor infectious. 4. No abscess related to the recent left neck central line placement. The left IJ appears to remain patent. Study discussed by telephone with ED Provider Celoron on 03/11/2015 at [place] [facility]. Electronically Signed   By: Genevie Ann M.D.   On: 03/11/2015 18:42    EKG: NSR CXR: none  ASSESSMENT / PLAN:  Active Problems:  Peritonsillar abscess   PULMONARY A: Obesity Hypoventilation Syndrome on Trilogy w/ O2 at home (Dr. Halford Chessman pt)  Chronic Hypoxic Respiratory Failure Tobacco use Hx of PE on Xarelto   P:   Continue Triology vent   w/ BiPAP mask and O2   At baseline PCO2. Will maintain on BiPAP on Servo-i 35/15. Nicotine patch  CARDIOVASCULAR A: CHF-Echo 02/2015 with EF 45-50%-recent admission with decompensation  pHTN P:    Diurese as scr /bp will allow  Check bmet in am   RENAL A: Metabolic Alkalosis  P:   Will diurese cautiously. 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    INFECTIOUS A: Peritonsilar Abscess, likely due to GAS infection>lactate remains low  P:   Cotninue Unasyn .  Follow cultures  ENT following   ENDOCRINE A: Morbid obesity P:   SSI  NEUROLOGIC A: Somnolence and intermittent confusion ? Secondary to infection/hypercarbia although appears compensated.  P:   Avoid narcotics if possible.  BIPAP compliance .   BEST PRACTICE / DISPOSITION Level of Care:  ICU Primary Service:  PCCM Consultants:  ENT Code Status:  Full Diet:  NPO DVT Px:  Heaprin GI Px:  None Skin Integrity:  Intact Social / Family:  Updated today  TODAY'S SUMMARY: 45 y/o man with peritonsilar abscess and OHS on  BIPAP Treating medically with surgical c/s and possible surgical intervention soon. Hx of DVT will need to restart anticoagulation once decide on surgery .    Tammy Parrett NP-C  [facility]  [phone]    03/12/2015, 7:34 AM   Attending Note:  45 year old male with extensive PMH who was recently discharged from the ICU for PNA and VDRF who presents back with peritonsilar abscess and pharyngeal edema.  ENT to evaluate patient and decide on taking patient to the OR.  On exam, patient is able to speak, lungs are clear and airway is moderately clear.  I reviewed CXR myself and noted no acute disease, cardiomegally.  Discussed with ENT.  If patient is to have surgery we can recover him in the ICU given size and chronic medical issues.  Will f/u post op.  The patient is critically ill with multiple organ systems failure and requires high complexity decision making for assessment and support, frequent evaluation and titration of therapies, application of advanced monitoring technologies and extensive interpretation of multiple databases.   Critical Care Time devoted to patient care services described in this note is  35  Minutes. This time reflects time of care of this signee Dr Jennet Maduro. This critical  care time does not reflect procedure time, or teaching time or supervisory time of PA/NP/Med student/Med Resident etc but could involve care discussion time.  Rush Farmer, M.D. [facility]monary/Critical Care Medicine. Pager: [phone]. After hours pager: [phone].

## 2015-03-12 NOTE — Progress Notes (Signed)
03/12/2015 12:55 PM  Dean Bowers [id number]  Hosp Day 2    Temp:  [98 F (36.7 C)] 98 F (36.7 C) (02/03 1540) Pulse Rate:  [64-111] 101 (02/04 1200) Resp:  [15-26] 19 (02/04 0500) BP: (89-172)/(37-134) 132/70 mmHg (02/04 1200) SpO2:  [85 %-98 %] 95 % (02/04 1200) Weight:  [181.8 kg (400 lb 12.7 oz)-187.336 kg (413 lb)] 183 kg (403 lb 7.1 oz) (02/04 0426),     Intake/Output Summary (Last 24 hours) at 03/12/15 1255 Last data filed at 03/12/15 1200  Gross per 24 hour  Intake    350 ml  Output   3100 ml  Net  -2750 ml    Results for orders placed or performed during the hospital encounter of 03/11/15 (from the past 24 hour(s))  CBC with Differential     Status: Abnormal   Collection Time: 03/11/15  5:05 PM  Result Value Ref Range   WBC 18.3 (H) 4.0 - 10.5 K/uL   RBC 5.76 4.22 - 5.81 MIL/uL   Hemoglobin 13.1 13.0 - 17.0 g/dL   HCT 44.9 39.0 - 52.0 %   MCV 78.0 78.0 - 100.0 fL   MCH 22.7 (L) 26.0 - 34.0 pg   MCHC 29.2 (L) 30.0 - 36.0 g/dL   RDW 19.4 (H) 11.5 - 15.5 %   Platelets 297 150 - 400 K/uL   Neutrophils Relative % 83 %   Neutro Abs 15.0 (H) 1.7 - 7.7 K/uL   Lymphocytes Relative 9 %   Lymphs Abs 1.6 0.7 - 4.0 K/uL   Monocytes Relative 7 %   Monocytes Absolute 1.4 (H) 0.1 - 1.0 K/uL   Eosinophils Relative 1 %   Eosinophils Absolute 0.2 0.0 - 0.7 K/uL   Basophils Relative 0 %   Basophils Absolute 0.1 0.0 - 0.1 K/uL  Basic metabolic panel     Status: Abnormal   Collection Time: 03/11/15  5:05 PM  Result Value Ref Range   Sodium 142 135 - 145 mmol/L   Potassium 3.8 3.5 - 5.1 mmol/L   Chloride 91 (L) 101 - 111 mmol/L   CO2 40 (H) 22 - 32 mmol/L   Glucose, Bld 139 (H) 65 - 99 mg/dL   BUN 17 6 - 20 mg/dL   Creatinine, Ser 1.04 0.61 - 1.24 mg/dL   Calcium 9.5 8.9 - 10.3 mg/dL   GFR calc non Af Amer >60 >60 mL/min   GFR calc Af Amer >60 >60 mL/min   Anion gap 11 5 - 15  I-Stat arterial blood gas, ED     Status: Abnormal   Collection Time: 03/11/15  8:36 PM   Result Value Ref Range   pH, Arterial 7.373 7.350 - 7.450   pCO2 arterial 70.9 (HH) 35.0 - 45.0 mmHg   pO2, Arterial 44.0 (L) 80.0 - 100.0 mmHg   Bicarbonate 41.4 (H) 20.0 - 24.0 mEq/L   TCO2 44 0 - 100 mmol/L   O2 Saturation 77.0 %   Acid-Base Excess 12.0 (H) 0.0 - 2.0 mmol/L   Patient temperature 98.0 F    Collection site RADIAL, ALLEN'S TEST ACCEPTABLE    Drawn by Operator    Sample type ARTERIAL    Comment NOTIFIED PHYSICIAN   CBC     Status: Abnormal   Collection Time: 03/11/15  8:45 PM  Result Value Ref Range   WBC 18.7 (H) 4.0 - 10.5 K/uL   RBC 5.72 4.22 - 5.81 MIL/uL   Hemoglobin 12.8 (L) 13.0 - 17.0 g/dL  HCT 44.5 39.0 - 52.0 %   MCV 77.8 (L) 78.0 - 100.0 fL   MCH 22.4 (L) 26.0 - 34.0 pg   MCHC 28.8 (L) 30.0 - 36.0 g/dL   RDW 19.3 (H) 11.5 - 15.5 %   Platelets 294 150 - 400 K/uL  Creatinine, serum     Status: None   Collection Time: 03/11/15  8:45 PM  Result Value Ref Range   Creatinine, Ser 0.92 0.61 - 1.24 mg/dL   GFR calc non Af Amer >60 >60 mL/min   GFR calc Af Amer >60 >60 mL/min  Lactic acid, plasma     Status: None   Collection Time: 03/11/15  8:45 PM  Result Value Ref Range   Lactic Acid, Venous 0.9 0.5 - 2.0 mmol/L  Type and screen If need to transfuse blood products please use the blood administration order set     Status: None   Collection Time: 03/11/15  8:45 PM  Result Value Ref Range   ABO/RH(D) A POS    Antibody Screen NEG    Sample Expiration 03/14/2015   ABO/Rh     Status: None   Collection Time: 03/11/15  8:45 PM  Result Value Ref Range   ABO/RH(D) A POS   MRSA PCR Screening     Status: None   Collection Time: 03/11/15  9:42 PM  Result Value Ref Range   MRSA by PCR NEGATIVE NEGATIVE  Lactic acid, plasma     Status: None   Collection Time: 03/11/15 10:11 PM  Result Value Ref Range   Lactic Acid, Venous 1.3 0.5 - 2.0 mmol/L  Lactic acid, plasma     Status: None   Collection Time: 03/12/15  1:50 AM  Result Value Ref Range   Lactic  Acid, Venous 0.8 0.5 - 2.0 mmol/L  CBC with Differential/Platelet     Status: Abnormal   Collection Time: 03/12/15  3:03 AM  Result Value Ref Range   WBC 17.1 (H) 4.0 - 10.5 K/uL   RBC 5.86 (H) 4.22 - 5.81 MIL/uL   Hemoglobin 13.3 13.0 - 17.0 g/dL   HCT 44.4 39.0 - 52.0 %   MCV 75.8 (L) 78.0 - 100.0 fL   MCH 22.7 (L) 26.0 - 34.0 pg   MCHC 30.0 30.0 - 36.0 g/dL   RDW 19.5 (H) 11.5 - 15.5 %   Platelets 213 150 - 400 K/uL   Neutrophils Relative % 82 %   Neutro Abs 14.1 (H) 1.7 - 7.7 K/uL   Lymphocytes Relative 9 %   Lymphs Abs 1.6 0.7 - 4.0 K/uL   Monocytes Relative 7 %   Monocytes Absolute 1.2 (H) 0.1 - 1.0 K/uL   Eosinophils Relative 1 %   Eosinophils Absolute 0.2 0.0 - 0.7 K/uL   Basophils Relative 1 %   Basophils Absolute 0.1 0.0 - 0.1 K/uL    SUBJECTIVE:  Less pain.  Voice and breathing feel better.  swallowing saliva.  OBJECTIVE:  Breathing well sitting up.  Less raspy voice.  LEFT palatal swelling down, uvula more nearly midline.    IMPRESSION:  Improved.  WBC down sl.  PLAN:   Npo p MN.  CBC in AM.  Cont IV abx. Resume diet for now.  Jodi Marble
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
## 2015-03-13 ENCOUNTER — Inpatient Hospital Stay (HOSPITAL_COMMUNITY): Payer: Medicare Other

## 2015-03-13 DIAGNOSIS — J392 Other diseases of pharynx: Secondary | ICD-10-CM

## 2015-03-13 DIAGNOSIS — J9622 Acute and chronic respiratory failure with hypercapnia: Secondary | ICD-10-CM

## 2015-03-13 DIAGNOSIS — J9621 Acute and chronic respiratory failure with hypoxia: Secondary | ICD-10-CM

## 2015-03-13 LAB — CBC
HEMATOCRIT: 44.5 % (ref 39.0–52.0)
HEMOGLOBIN: 12.2 g/dL — AB (ref 13.0–17.0)
MCH: 21.8 pg — ABNORMAL LOW (ref 26.0–34.0)
MCHC: 27.4 g/dL — ABNORMAL LOW (ref 30.0–36.0)
MCV: 79.6 fL (ref 78.0–100.0)
Platelets: 254 10*3/uL (ref 150–400)
RBC: 5.59 MIL/uL (ref 4.22–5.81)
RDW: 19.3 % — ABNORMAL HIGH (ref 11.5–15.5)
WBC: 15.5 10*3/uL — AB (ref 4.0–10.5)

## 2015-03-13 LAB — BASIC METABOLIC PANEL
ANION GAP: 10 (ref 5–15)
BUN: 7 mg/dL (ref 6–20)
CALCIUM: 9 mg/dL (ref 8.9–10.3)
CO2: 43 mmol/L — ABNORMAL HIGH (ref 22–32)
Chloride: 87 mmol/L — ABNORMAL LOW (ref 101–111)
Creatinine, Ser: 0.71 mg/dL (ref 0.61–1.24)
GFR calc Af Amer: >60 mL/min (ref >60)
GFR calc non Af Amer: >60 mL/min (ref >60)
GLUCOSE: 113 mg/dL — AB (ref 65–99)
Potassium: 3.1 mmol/L — ABNORMAL LOW (ref 3.5–5.1)
Sodium: 140 mmol/L (ref 135–145)

## 2015-03-13 LAB — MAGNESIUM: Magnesium: 2.1 mg/dL (ref 1.7–2.4)

## 2015-03-13 LAB — HEPARIN LEVEL (UNFRACTIONATED): HEPARIN UNFRACTIONATED: 0.49 [IU]/mL (ref 0.30–0.70)

## 2015-03-13 LAB — APTT: APTT: 33 s (ref 24–37)

## 2015-03-13 LAB — PHOSPHORUS: PHOSPHORUS: 3.7 mg/dL (ref 2.5–4.6)

## 2015-03-13 MED ORDER — AMOXICILLIN-POT CLAVULANATE 875-125 MG PO TABS: 1.0000 | ORAL_TABLET | Freq: Two times a day (BID) | ORAL | Status: AC

## 2015-03-13 MED ORDER — POTASSIUM CHLORIDE 10 MEQ/100ML IV SOLN
10.0000 meq | INTRAVENOUS | Status: AC
  Administered 2015-03-13 (×6): 10 meq via INTRAVENOUS
  Filled 2015-03-13 (×6): qty 100

## 2015-03-13 NOTE — Addendum Note (Signed)
Addended by: Rubbie Battiest on: 03/13/2015 08:40 AM   Modules accepted: Miquel Dunn

## 2015-03-13 NOTE — Progress Notes (Signed)
03/13/2015 12:37 PM  Dean Bowers [id number]  Hosp  Day 3    Temp:  [97.6 F (36.4 C)-98.7 F (37.1 C)] 98.3 F (36.8 C) (02/05 0751) Pulse Rate:  [66-99] 92 (02/05 1100) Resp:  [12-13] 13 (02/05 0330) BP: (103-149)/(63-112) 105/66 mmHg (02/05 1100) SpO2:  [87 %-99 %] 95 % (02/05 1100) Weight:  [181.9 kg (401 lb 0.3 oz)] 181.9 kg (401 lb 0.3 oz) (02/05 0500),     Intake/Output Summary (Last 24 hours) at 03/13/15 1237 Last data filed at 03/13/15 1106  Gross per 24 hour  Intake 1179.12 ml  Output   1950 ml  Net -770.88 ml    Results for orders placed or performed during the hospital encounter of 03/11/15 (from the past 24 hour(s))  APTT     Status: None   Collection Time: 03/12/15  2:40 PM  Result Value Ref Range   aPTT 27 24 - 37 seconds  Heparin level (unfractionated)     Status: Abnormal   Collection Time: 03/12/15  2:40 PM  Result Value Ref Range   Heparin Unfractionated 0.76 (H) 0.30 - 0.70 IU/mL  APTT     Status: Abnormal   Collection Time: 03/12/15 10:27 PM  Result Value Ref Range   aPTT 52 (H) 24 - 37 seconds  Basic metabolic panel     Status: Abnormal   Collection Time: 03/13/15  3:50 AM  Result Value Ref Range   Sodium 140 135 - 145 mmol/L   Potassium 3.1 (L) 3.5 - 5.1 mmol/L   Chloride 87 (L) 101 - 111 mmol/L   CO2 43 (H) 22 - 32 mmol/L   Glucose, Bld 113 (H) 65 - 99 mg/dL   BUN 7 6 - 20 mg/dL   Creatinine, Ser 0.71 0.61 - 1.24 mg/dL   Calcium 9.0 8.9 - 10.3 mg/dL   GFR calc non Af Amer >60 >60 mL/min   GFR calc Af Amer >60 >60 mL/min   Anion gap 10 5 - 15  Magnesium     Status: None   Collection Time: 03/13/15  3:50 AM  Result Value Ref Range   Magnesium 2.1 1.7 - 2.4 mg/dL  Phosphorus     Status: None   Collection Time: 03/13/15  3:50 AM  Result Value Ref Range   Phosphorus 3.7 2.5 - 4.6 mg/dL  Heparin level (unfractionated)     Status: None   Collection Time: 03/13/15  3:50 AM  Result Value Ref Range   Heparin Unfractionated 0.49 0.30 -  0.70 IU/mL  CBC     Status: Abnormal   Collection Time: 03/13/15  3:50 AM  Result Value Ref Range   WBC 15.5 (H) 4.0 - 10.5 K/uL   RBC 5.59 4.22 - 5.81 MIL/uL   Hemoglobin 12.2 (L) 13.0 - 17.0 g/dL   HCT 44.5 39.0 - 52.0 %   MCV 79.6 78.0 - 100.0 fL   MCH 21.8 (L) 26.0 - 34.0 pg   MCHC 27.4 (L) 30.0 - 36.0 g/dL   RDW 19.3 (H) 11.5 - 15.5 %   Platelets 254 150 - 400 K/uL  APTT     Status: None   Collection Time: 03/13/15  3:50 AM  Result Value Ref Range   aPTT 33 24 - 37 seconds    SUBJECTIVE:  Less throat pain.  Swallowing better.  Slept well with BiPAP.    OBJECTIVE:  Voice loud and clear.  Less pharyngeal swelling, asymmetry, erythema.  IMPRESSION:  Satisfactory check  PLAN:  Could  transfer to floor, or even consider discharge home. Po abx.  Recheck my office 2 days.  Activity as tolerated, and diet.    Jodi Marble

## 2015-03-13 NOTE — Discharge Summary (Signed)
Physician Discharge Summary  Patient ID: Dean Bowers MRN: [id number] DOB/AGE: [birth year] 45 y.o.  Admit date: 03/11/2015 Discharge date: 03/13/2015    Discharge Diagnoses:  1. Peritonsillar Abscess -GAS  2. Chronic Hypoxic/Hypercarbic Respiratory Failure  3. OHS  4. Hx of PE on Xarelto  5. CHF -                                                                     DISCHARGE PLAN BY DIAGNOSIS      Discharge Plan:  PULMONARY A: Obesity Hypoventilation Syndrome on Trilogy w/ O2 at home (Dr. Craige Cotta pt)  Chronic Hypoxic Respiratory Failure Tobacco use Hx of PE on Xarelto   P:  Continue Triology vent w/ BiPAP mask and O2 At bedtime  At baseline PCO2. Will maintain on BiPAP on Servo-i 30/15 at discharge Order to DME to begin set pressure at 30/15.  Has follow up with Dr. Craige Cotta On 03/23/15 .    CARDIOVASCULAR A: CHF-Echo 02/2015 with EF 45-50%-recent admission with decompensation  pHTN P:  Resume demadex at discharge .  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  Check CBC in 2-3 weeks    INFECTIOUS A: Peritonsilar Abscess, likely due to GAS infection>lactate remains low  P:  Tx w/ Unasyn , transition to Augmentin at discharge for additional 12d for total of 14 days of therapy.  Follow cultures  Follow up with ENT in 3 days .                    DISCHARGE SUMMARY   Dean Bowers is a 45 y.o. 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 He had a recent admission for a CHF/OHS exacerbation requiring intubation. He had presented to an urgent care and had a throat swab (+) for group A strep, but was not treated with PCN. His symptoms worsened, and he  presented to the ED. He was intermittently somnolent, and given his significant hypoxia, PCCM was consulted for admission. PT was seen by ENT with recommendations for I/D however pt declined. He was started on IV abx with Unasyn. Pt responded well with decreased pain and swelling. WBC trended down and pt remained afebrile with normal blood pressure. He is very anxious to go home and says he will continue on abx in OP setting with close ENT and Pulmonary follow up .              SIGNIFICANT DIAGNOSTIC STUDIES CT scan 2/3 w/ abscess on L peri (or intra-) tonsillar abscess    MICRO DATA  (Rapid Group A strep screen + on 2/1) Blood x2 2/3>>  ANTIBIOTICS Unasyn 2/3 >>2/5  Augmentin x 12 d (total 14 d of therapy)   CONSULTS ENT consult 2/3 Dr. Lazarus Salines   TUBES / LINES PIV      Discharge Exam: General: Obese man, in NAD, sitting up in chair  Neuro: oriented &4, following commands  HEENT: Markedly swollen L tonsil,  Voice changes improved  Controlling secretions and swallowing well.  Neck: Obese, no LAD Cardiovascular: Heart sounds dual Lungs: Distant lung sounds Abdomen: Obese Musculoskeletal: Amputated RLE Skin: Many tattoos, no rashes.  Filed Vitals:   03/13/15 0900 03/13/15 1100 03/13/15 1200 03/13/15 1300  BP: 133/68 105/66 139/72 136/50  Pulse: 83 92 94 71  Temp:      TempSrc:      Resp:      Height:      Weight:      SpO2: 94% 95% 94% 93%     Discharge Labs  BMET  Recent Labs Lab 03/11/15 1705 03/11/15 2045 03/13/15 0350  NA 142  --  140  K 3.8  --  3.1*  CL 91*  --  87*  CO2 40*  --  43*  GLUCOSE 139*  --  113*  BUN 17  --  7  CREATININE 1.04 0.92 0.71  CALCIUM 9.5  --  9.0  MG  --   --  2.1  PHOS  --   --  3.7    CBC  Recent Labs Lab 03/11/15 2045 03/12/15 0303 03/13/15 0350  HGB 12.8* 13.3 12.2*  HCT 44.5 44.4 44.5  WBC 18.7* 17.1* 15.5*  PLT 294 213 254    Anti-Coagulation No results for input(s): INR in the last 168  hours.  Discharge Instructions    (HEART FAILURE PATIENTS) Call MD:  Anytime you have any of the following symptoms: 1) 3 pound weight gain in 24 hours or 5 pounds in 1 week 2) shortness of breath, with or without a dry hacking cough 3) swelling in the hands, feet or stomach 4) if you have to sleep on extra pillows at night in order to breathe.    Complete by:  As directed      Call MD for:  difficulty breathing, headache or visual disturbances    Complete by:  As directed      Call MD for:  extreme fatigue    Complete by:  As directed      Call MD for:  hives    Complete by:  As directed      Call MD for:  persistant dizziness or light-headedness    Complete by:  As directed      Call MD for:  persistant nausea and vomiting    Complete by:  As directed      Call MD for:  severe uncontrolled pain    Complete by:  As directed      Call MD for:  temperature >100.4    Complete by:  As directed      Diet - low sodium heart healthy    Complete by:  As directed      Discharge instructions    Complete by:  As directed   Call Dr.Wolicki office on Monday 03/14/15 to make an appointment ~2/8 for recheck     Increase activity slowly    Complete by:  As directed             Follow-up Information    Follow up with Chesley Mires, MD. Go on 03/23/2015.   Specialty:  Pulmonary Disease   Why:  03/23/15 at 10:30 am    Contact information:   [address] [place] [address] [phone]       Follow up with Jodi Marble, MD. Schedule an appointment as soon as possible for a visit in 3 days.  Specialty:  Otolaryngology   Why:  for throat recheck    Contact information:   [address] [place] [address] [phone]          Medication List    STOP taking these medications        azithromycin 250 MG tablet  Commonly known as:  ZITHROMAX     ibuprofen 800 MG tablet  Commonly known as:  ADVIL,MOTRIN      TAKE these medications        ALPRAZolam 0.5 MG  tablet  Commonly known as:  XANAX  Take 1 tablet (0.5 mg total) by mouth 3 (three) times daily as needed.     amoxicillin-clavulanate 875-125 MG tablet  Commonly known as:  AUGMENTIN  Take 1 tablet by mouth 2 (two) times daily.     aspirin 81 MG EC tablet  Take 1 tablet (81 mg total) by mouth daily.     CARTIA XT 120 MG 24 hr capsule  Generic drug:  diltiazem  Take 1 tablet by mouth daily.     diclofenac 75 MG EC tablet  Commonly known as:  VOLTAREN  TAKE 1 TABLET(75 MG) BY MOUTH TWICE DAILY As needed for pain     gabapentin 600 MG tablet  Commonly known as:  NEURONTIN  TAKE 1 TABLET BY MOUTH TWICE DAILY     Medical Compression Socks Misc  2 each by Does not apply route daily.     multivitamin with minerals tablet  Take 1 tablet by mouth daily.     NON FORMULARY  Inhale 1 application into the lungs at bedtime. trilogy noninvasive ventilation     oxyCODONE-acetaminophen 5-325 MG tablet  Commonly known as:  ROXICET  Take 1 tablet by mouth 2 (two) times daily as needed for severe pain.     OXYGEN  Inhale 3 L into the lungs at bedtime.     Potassium Chloride ER 20 MEQ Tbcr  Take 20 mEq by mouth daily.     promethazine-dextromethorphan 6.25-15 MG/5ML syrup  Commonly known as:  PROMETHAZINE-DM  Take 5 mLs by mouth 4 (four) times daily as needed for cough.     PROVENTIL HFA 108 (90 Base) MCG/ACT inhaler  Generic drug:  albuterol  INHALE 2 PUFFS BY MOUTH EVERY 6 HOURS AS NEEDED FOR WHEEZING OR SHORTNESS OF BREATH     sildenafil 20 MG tablet  Commonly known as:  REVATIO  TAKE 1 OR 2 TABLETS BY MOUTH AS NEEDED     Testosterone 20.25 MG/ACT (1.62%) Gel  Commonly known as:  ANDROGEL PUMP  Place 4 application onto the skin daily.     torsemide 20 MG tablet  Commonly known as:  DEMADEX  TAKE 1 TABLET BY MOUTH TWICE DAILY, IF RETAINING FLUID THEN TAKE 2 TABLETS BY MOUTH TWICE DAILY     traZODone 50 MG tablet  Commonly known as:  DESYREL  Take 1 tablet (50 mg total) by  mouth at bedtime.     XARELTO 20 MG Tabs tablet  Generic drug:  rivaroxaban  TAKE 1 TABLET BY MOUTH DAILY WITH DINNER          Disposition:  Discharge home with wife.   Discharged Condition: Dean Bowers has met maximum benefit of inpatient care and is medically stable and cleared for discharge.  Patient is pending follow up as above.      Time spent on disposition:  Greater than 35 minutes.   Signed: Delaynee Alred NP-C  [facility]  [phone]

## 2015-03-13 NOTE — Progress Notes (Signed)
Discharge orders are in. Education, AVS given to pt and family. IV removed. Additional questions denies. Pt belongings with the patient and transferred on wheelchair out.

## 2015-03-13 NOTE — Progress Notes (Signed)
eLink Physician-Brief Progress Note Patient Name: Dean Bowers DOB: [birth year] MRN: [id number]   Date of Service  03/13/2015  HPI/Events of Note  Hypokalemia 3.1 w/ PIV  eICU Interventions  KCl 17mEq x6 runs     Intervention Category Intermediate Interventions: Electrolyte abnormality - evaluation and management  Tera Partridge 03/13/2015, 6:00 AM

## 2015-03-13 NOTE — Progress Notes (Cosign Needed)
PULMONARY / CRITICAL CARE MEDICINE HISTORY AND PHYSICAL EXAMINATION   Name: Dean Bowers MRN: [id number] DOB: [birth year]    ADMISSION DATE:  03/11/2015  PRIMARY SERVICE: PCCM  CHIEF COMPLAINT:  Sore throat  BRIEF PATIENT DESCRIPTION: morbidly obese man recently admitted with OHS (On Trilogy w/ O2 At bedtime) admitted with peritonsillar abscess. Recent admission last month with resp failure on vent w/ CHF decompensation.   SIGNIFICANT EVENTS / STUDIES:  CT scan 2/3 w/ abscess on L peri (or intra-) tonsillar abscess ENT consult 2/3 Dr. 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 He had a recent admission for a CHF/OHS exacerbation requiring intubation. He had presented to an urgent care and had a throat swab (+) for group A strep, but was not treated with PCN. His symptoms worsened, and he presented to the ED. He was intermittently somnolent, and given his significant hypoxia, PCCM was consulted for admission.   SUBJECTIVE:  Improved with less pain, voice is better.  Wore BIPAP more last night  Likes BIPAP pressure at 30/15 , wants to be changed to this setting at discharge Currently on auto set on home BIPAP .   VITAL SIGNS: Temp:  [97.6 F (36.4 C)-98.7 F (37.1 C)] 98.3 F (36.8 C) (02/05 0751) Pulse Rate:  [66-104] 83 (02/05 0900) Resp:  [12-13] 13 (02/05 0330) BP: (103-149)/(63-112) 133/68 mmHg (02/05 0900) SpO2:  [87 %-99 %] 94 % (02/05 0900) Weight:  [401 lb 0.3 oz (181.9 kg)] 401 lb 0.3 oz (181.9 kg) (02/05 0500) HEMODYNAMICS:   VENTILATOR SETTINGS:   INTAKE / OUTPUT: Intake/Output      02/04 0701 - 02/05 0700 02/05 0701 - 02/06 0700   I.V. (mL/kg) 329.1 (1.8)    IV Piggyback 500 300   Total  Intake(mL/kg) 829.1 (4.6) 300 (1.6)   Urine (mL/kg/hr) 2500 (0.6)    Total Output 2500     Net -1670.9 +300          PHYSICAL EXAMINATION: General:  Obese man, in NAD Neuro:  oriented &4, following commands  HEENT:  Markedly swollen L tonsil, about half of uvula visible. Voice changes. Controlling secretions Neck: Obese, no LAD Cardiovascular:  Heart sounds dual Lungs:  Distant lung sounds Abdomen:  Obese Musculoskeletal:  Amputated RLE Skin:  Many tattoos, no rashes.  LABS:  CBC  Recent Labs Lab 03/11/15 2045 03/12/15 0303 03/13/15 0350  WBC 18.7* 17.1* 15.5*  HGB 12.8* 13.3 12.2*  HCT 44.5 44.4 44.5  PLT 294 213 254   Coag's  Recent Labs Lab 03/12/15 1440 03/12/15 2227 03/13/15 0350  APTT 27 52* 33   BMET  Recent Labs Lab 03/11/15 1705 03/11/15 2045 03/13/15 0350  NA 142  --  140  K 3.8  --  3.1*  CL 91*  --  87*  CO2 40*  --  43*  BUN 17  --  7  CREATININE 1.04 0.92 0.71  GLUCOSE 139*  --  113*   Electrolytes  Recent Labs Lab 03/11/15 1705 03/13/15 0350  CALCIUM 9.5 9.0  MG  --  2.1  PHOS  --  3.7   Sepsis Markers  Recent Labs Lab 03/11/15 2045 03/11/15 2211 03/12/15  0150  LATICACIDVEN 0.9 1.3 0.8   ABG  Recent Labs Lab 03/11/15 2036  PHART 7.373  PCO2ART 70.9*  PO2ART 44.0*   Liver Enzymes No results for input(s): AST, ALT, ALKPHOS, BILITOT, ALBUMIN in the last 168 hours. Cardiac Enzymes No results for input(s): TROPONINI, PROBNP in the last 168 hours. Glucose No results for input(s): GLUCAP in the last 168 hours.  Imaging Ct Soft Tissue Neck W Contrast  03/11/2015  CLINICAL DATA:  45 year old male was hospitalized and intubated last week. Sensation of throat closing now. Query abscess along the left neck where a central line had been in place. EXAM: CT NECK WITH CONTRAST TECHNIQUE: Multidetector CT imaging of the neck was performed using the standard protocol following the bolus administration of intravenous contrast.  CONTRAST:  19mL OMNIPAQUE IOHEXOL 300 MG/ML  SOLN COMPARISON:  Face and cervical spine CT 05/2012 FINDINGS: Large body habitus. Pharynx and larynx: The nasopharynx appears within normal limits. There is superior parapharyngeal space stranding greater on the left. The oropharynx is abnormal, with enlarged and indistinct palatine tonsils. There is a 12 mm hypodense area within the left tonsil nearly at midline. There is lesser hypodensity in the right tonsil. Note also that there is a retropharyngeal right ICA which approaches midline and in courses horizontally in the right retro pharynx on series 2, image 44. Subsequently the lower part of the naso pharyngeal airway and upper part of the oropharynx is occluded. There might also be additional suppuration in the more anterior tonsillar pillar on series 2, image 40. The posterior wall of the pharynx is swollen and edematous, greater on the left. There is a lower retropharyngeal space diffusion, but no retropharyngeal space abscess. The lingual tonsil is mildly hypertrophied. The epiglottis and 80 folds also appear mildly thickened. There is motion at the supraglottic airway. The glottis and subglottic trachea appears within normal limits. Salivary glands: Sublingual space, submandibular glands, and parotid glands appear within normal limits. Thyroid: Negative. Lymph nodes: Moderate to severe level 2 lymphadenopathy, greater on the left. Enlarged left level IIa nodes measure up to 24 mm short axis wall those on the right measure up to 19 mm. Lesser, more reactive appearing level 3 lymphadenopathy. Level 1, level 4, and level 5 nodes appear within normal limits. There is a skin marker along the left lateral neck series 2, image 61. No superficial abscess or regional fluid collection is present. There is extensive regional lymphadenopathy. The left IJ appears to remain patent. Vascular: Major vascular structures in the neck and at the skullbase appear patent including  the internal jugular veins. Note retropharyngeal course of both carotids, more so the right. Limited intracranial: Negative. Visualized orbits: Negative. Mastoids and visualized paranasal sinuses: Minor ethmoid sinus mucosal thickening. Skeleton: No obvious odontogenic source of infection identified. Degenerative changes in the cervical spine with reversed lordosis. Severe left C3-C4 facet arthropathy. No acute osseous abnormality identified. Upper chest: Mediastinal lipomatosis. No superior mediastinal lymphadenopathy. Negative lung apices. IMPRESSION: 1. Acute tonsillitis/pharyngitis. Suppuration of the palatine tonsils greater on the left with probable intra tonsillar abscess extending to the midline. See series 2, images 40-44). However note retropharyngeal course of both carotid arteries, especially the right. 2. Mild laryngeal edema. Effaced nasopharyngeal, upper oropharyngeal airway. Retropharyngeal effusion. 3. Moderate to severe bilateral level 2 lymphadenopathy, favor infectious. 4. No abscess related to the recent left neck central line placement. The left IJ appears to remain patent. Study discussed by telephone with ED Provider [place] on 03/11/2015 at [place] hours.  Electronically Signed   By: Genevie Ann M.D.   On: 03/11/2015 18:42   Dg Chest Port 1 View  03/13/2015  CLINICAL DATA:  Respiratory failure EXAM: PORTABLE CHEST 1 VIEW COMPARISON:  03/02/2015 FINDINGS: Stable widening upper mediastinum enlarged cardiac silhouette. There is central venous congestion and bibasilar atelectasis. Mild increase in LEFT basilar atelectasis. No pneumothorax. Removal of LEFT IJ 2. IMPRESSION: 1. Mild increase in LEFT basilar atelectasis. 2. 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 3. Central venous congestion unchanged. Electronically Signed   By: Suzy Bouchard M.D.   On: 03/13/2015 07:22    EKG: NSR CXR: none  ASSESSMENT / PLAN:  Active  Problems:   Peritonsillar abscess   PULMONARY A: Obesity Hypoventilation Syndrome on Trilogy w/ O2 at home (Dr. Halford Chessman pt)  Chronic Hypoxic Respiratory Failure Tobacco use Hx of PE on Xarelto   P:   Continue Triology vent   w/ BiPAP mask and O2  At bedtime   At baseline PCO2. Will maintain on BiPAP on Servo-i 30/15 at discharge Has follow up with Dr. Halford Chessman  On 03/23/15 .  Nicotine patch  CARDIOVASCULAR A: CHF-Echo 02/2015 with EF 45-50%-recent admission with decompensation  pHTN P:   Cont lasix , change to oral dose once taking po.  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    INFECTIOUS A: Peritonsilar Abscess, likely due to GAS infection>lactate remains low  P:   Cotninue Unasyn .  Follow cultures  ENT following   ENDOCRINE A: Morbid obesity P:   SSI  NEUROLOGIC A: Somnolence and intermittent confusion ? Secondary to infection/hypercarbia although appears compensated.  P:   Avoid narcotics if possible.  BIPAP compliance .   BEST PRACTICE / DISPOSITION Level of Care:  ICU Primary Service:  PCCM Consultants:  ENT Code Status:  Full Diet:  NPO DVT Px:  [place] GI Px:  None Skin Integrity:  Intact Social / Family:  Updated today  TODAY'S SUMMARY: 45 y/o man with peritonsilar abscess and OHS on  BIPAP Treating medically with surgical c/s and possible surgical intervention soon. Hx of DVT will need to restart anticoagulation once decide on surgery .  Appears to be responding to abx. Once ENT seen today , if no surgery , restart diet and xarelto and move to Floor.    Tammy Parrett NP-C  [facility]  [phone]    03/13/2015, 10:07 AM

## 2015-03-14 ENCOUNTER — Telehealth: Payer: Self-pay | Admitting: Adult Health

## 2015-03-14 ENCOUNTER — Other Ambulatory Visit: Payer: Self-pay | Admitting: Adult Health

## 2015-03-14 DIAGNOSIS — J9621 Acute and chronic respiratory failure with hypoxia: Secondary | ICD-10-CM

## 2015-03-14 DIAGNOSIS — J9622 Acute and chronic respiratory failure with hypercapnia: Secondary | ICD-10-CM

## 2015-03-14 DIAGNOSIS — G4733 Obstructive sleep apnea (adult) (pediatric): Secondary | ICD-10-CM

## 2015-03-14 NOTE — Telephone Encounter (Signed)
What settings is he on now ?  Will probably just leave on until seen in office next week with Dr. Halford Chessman

## 2015-03-14 NOTE — Telephone Encounter (Signed)
Spoke with [place] at [facility]. Advised her to keep pt on his current settings. Nothing further was needed.

## 2015-03-14 NOTE — Telephone Encounter (Signed)
Per 03/14/15 order: Please send order for To change BIPAP pressure setting to 30/15 ---  Called spoke with Melissa from [facility]. She reports pt has non invasive vent triology. She can do the press max to 30 and epap min/max from 10-30 Please advise TP if okay to order this? thanks

## 2015-03-15 ENCOUNTER — Telehealth: Payer: Self-pay | Admitting: Pulmonary Disease

## 2015-03-15 NOTE — Telephone Encounter (Signed)
Per 03/14/15 phone note: Randa Spike, CMA at 03/14/2015 4:09 PM     Status: Signed       Expand All Collapse All   Spoke with [place] at [facility]. Advised her to keep pt on his current settings. Nothing further was needed.            Melvenia Needles, NP at 03/14/2015 4:03 PM     Status: Signed       Expand All Collapse All   What settings is he on now ?  Will probably just leave on until seen in office next week with Dr. Halford Chessman       --  Called spoke with spouse and made aware will leave settings the same until he see's Dr. Halford Chessman next week. She verbalized understanding and needed nothing further.

## 2015-03-16 ENCOUNTER — Telehealth: Payer: Self-pay | Admitting: Internal Medicine

## 2015-03-16 LAB — CULTURE, BLOOD (ROUTINE X 2)
CULTURE: NO GROWTH
Culture: NO GROWTH

## 2015-03-16 NOTE — Telephone Encounter (Signed)
[facility] is requesting verbals For 2x/ week and 3 weeks for gait balance, balance training, and strengthening.

## 2015-03-17 NOTE — Telephone Encounter (Signed)
Returned call to wife. Advised OK to take extra torsemide as outlined in prescription notes. Recommended if doing so to have pt take extra potassium (pt K+ was low when last drawn 4 days ago - unsure if this was addressed).  Pt to follow up w Ellen Henri at [facility] on 2/20. Caller understands that she should call back if any new problems or if swelling not improved w/ higher torsemide dosing. Can resume maintenance dose of torsemide and potassium if swelling resolved - explained he will need to take this maintenance dose even in absence of swelling.

## 2015-03-17 NOTE — Telephone Encounter (Signed)
New <essage  Pt c/o swelling: STAT is pt has developed SOB within 24 hours  1. How long have you been experiencing swelling? Never really went away    2. Where is the swelling located? Knee down. Specifically knee and ankle   3.  Are you currently taking a "fluid pill"? Yes. And sometimes he will double. When he is in the hospital it goes down but the wife feels like that is a different fluid pill that he is given in the hospital.   4.  Are you currently SOB? No   5.  Have you traveled recently? No   Comments: Appt Sch with PA on 02/20 at 3p

## 2015-03-23 ENCOUNTER — Ambulatory Visit (INDEPENDENT_AMBULATORY_CARE_PROVIDER_SITE_OTHER): Payer: 59 | Admitting: Pulmonary Disease

## 2015-03-23 ENCOUNTER — Encounter: Payer: Self-pay | Admitting: Pulmonary Disease

## 2015-03-23 VITALS — BP 110/62 | HR 97 | Ht 69.0 in | Wt >= 6400 oz

## 2015-03-23 DIAGNOSIS — E662 Morbid (severe) obesity with alveolar hypoventilation: Secondary | ICD-10-CM

## 2015-03-23 DIAGNOSIS — G4733 Obstructive sleep apnea (adult) (pediatric): Secondary | ICD-10-CM

## 2015-03-23 NOTE — Patient Instructions (Signed)
Will arrange for CPAP titration study  Follow up in 6 months 

## 2015-03-23 NOTE — Progress Notes (Signed)
Current Outpatient Prescriptions on File Prior to Visit  Medication Sig  . ALPRAZolam (XANAX) 0.5 MG tablet Take 1 tablet (0.5 mg total) by mouth 3 (three) times daily as needed. (Patient taking differently: Take 0.5 mg by mouth 3 (three) times daily as needed for anxiety. )  . aspirin EC 81 MG EC tablet Take 1 tablet (81 mg total) by mouth daily.  Marland Kitchen CARTIA XT 120 MG 24 hr capsule Take 1 tablet by mouth daily.   . diclofenac (VOLTAREN) 75 MG EC tablet TAKE 1 TABLET(75 MG) BY MOUTH TWICE DAILY As needed for pain  . Elastic Bandages & Supports (MEDICAL COMPRESSION SOCKS) MISC 2 each by Does not apply route daily.  Marland Kitchen gabapentin (NEURONTIN) 600 MG tablet TAKE 1 TABLET BY MOUTH TWICE DAILY  . Multiple Vitamins-Minerals (MULTIVITAMIN WITH MINERALS) tablet Take 1 tablet by mouth daily.  . NON FORMULARY Inhale 1 application into the lungs at bedtime. trilogy noninvasive ventilation  . oxyCODONE-acetaminophen (ROXICET) 5-325 MG tablet Take 1 tablet by mouth 2 (two) times daily as needed for severe pain.  . OXYGEN Inhale 3 L into the lungs at bedtime.  . Potassium Chloride ER 20 MEQ TBCR Take 20 mEq by mouth daily.  . promethazine-dextromethorphan (PROMETHAZINE-DM) 6.25-15 MG/5ML syrup Take 5 mLs by mouth 4 (four) times daily as needed for cough.  Marland Kitchen PROVENTIL HFA 108 (90 BASE) MCG/ACT inhaler INHALE 2 PUFFS BY MOUTH EVERY 6 HOURS AS NEEDED FOR WHEEZING OR SHORTNESS OF BREATH  . sildenafil (REVATIO) 20 MG tablet TAKE 1 OR 2 TABLETS BY MOUTH AS NEEDED (Patient taking differently: TAKE 1 OR 2 TABLETS BY MOUTH AS NEEDED FOR PAH)  . Testosterone (ANDROGEL PUMP) 20.25 MG/ACT (1.62%) GEL Place 4 application onto the skin daily.  Marland Kitchen torsemide (DEMADEX) 20 MG tablet TAKE 1 TABLET BY MOUTH TWICE DAILY, IF RETAINING FLUID THEN TAKE 2 TABLETS BY MOUTH TWICE DAILY  . traZODone (DESYREL) 50 MG tablet Take 1 tablet (50 mg total) by mouth at bedtime.  Alveda Reasons 20 MG TABS tablet TAKE 1 TABLET BY MOUTH DAILY WITH DINNER  (Patient taking differently: TAKE 1 TABLET BY MOUTH DAILY WITH BREAKFAST)   No current facility-administered medications on file prior to visit.     Chief Complaint  Patient presents with  . Follow-up    Uses Trilogy at bedtime with 3 Liters Oxygen. Pt states that his machine does not seem to function the same nightly - feels some nights that he is suffocating. Requests that his machine be set at a fixed level. DME: [employer]; Pt reports low O2 sats at rest into the 70s- upon arrival patient O2 ranging 84%-90%.      Tests PFT 06/16/13 >> FEV1 2.13 (53%), FEV1% 81, TLC 4.92 (72%), DLCO 95%, no BD PSG 04/23/14 >> AHI 139.7, SpO2 low 70% CT chest 04/28/14 >> small PE LLL subsegment, Rt pleural effusion, 3 mm nodule RML, 4 mm nodule LLL, Sub carinal LAN Trilogy AVAPS settings >> Vt 630, PS 6 to 30 cm H2O, max IPAP 35 cm H2O, EPAP 4 to 20 cm H2O. Used with 2 liters oxygen. CT chest 11/18/14 >> no change  Past medical hx HLD, s/p Rt BKA, HTN, Chronic pain, Anxiety, nephrolithiasis, PE  Past surgical hx, Allergies, Family hx, Social hx all reviewed.  Vital Signs BP 110/62 mmHg  Pulse 97  Ht 5\' 9"  (1.753 m)  Wt 413 lb 6.4 oz (187.517 kg)  BMI 61.02 kg/m2  SpO2 90%  History of Present Illness Field Haberberger  Demore is a 45 y.o. male smoker with OSA, OHS, morbid obesity, and abnormal CT chest.  He is not able to adjust to trilogy.  Keeps waking up feeling like he is not getting enough pressure.  He likes the mask.    After his last visit with me we had his EPAP setting changed to 10 to 20 cm H2O, and his pressure support to 6 to 25 cm H2O.  He had CT chest in October 2016 >> stable.  He still smokes >> he was told to work on getting weight down first and then stop smoking cigarettes.  Physical Exam  General - No distress, in wheel chair ENT - No sinus tenderness, no oral exudate, no LAN Cardiac - s1s2 regular, no murmur Chest - No wheeze/rales/dullness Back - No focal tenderness Abd -  Soft, non-tender Ext - Rt BKA Neuro - Normal strength Skin - No rashes Psych - normal mood, and behavior   Assessment/Plan  Obstructive sleep apnea. Plan: - will arrange for in lab titration study - he might need ENT evaluation if he is not able to be adequately titrated with PAP therapy >> very large tonsils  Obesity hypoventilation syndrome. Plan: - continue 3 liters oxygen at night - discussed options to assist with weight loss  Tobacco abuse. Plan: - reviewed options to help with smoking cessation  Pulmonary nodules and subcarinal adenopathy. Plan: - f/u CT chest w/o contrast in October 2017   Patient Instructions  Will arrange for CPAP titration study  Follow up in 6 months     Chesley Mires, MD [place] Pager:  [phone]

## 2015-03-25 ENCOUNTER — Ambulatory Visit (HOSPITAL_BASED_OUTPATIENT_CLINIC_OR_DEPARTMENT_OTHER): Payer: 59 | Attending: Pulmonary Disease

## 2015-03-25 VITALS — Ht 69.0 in | Wt >= 6400 oz

## 2015-03-25 DIAGNOSIS — G4730 Sleep apnea, unspecified: Secondary | ICD-10-CM

## 2015-03-25 DIAGNOSIS — Z6841 Body Mass Index (BMI) 40.0 and over, adult: Secondary | ICD-10-CM | POA: Diagnosis not present

## 2015-03-25 DIAGNOSIS — G4733 Obstructive sleep apnea (adult) (pediatric): Secondary | ICD-10-CM

## 2015-03-25 DIAGNOSIS — G4736 Sleep related hypoventilation in conditions classified elsewhere: Secondary | ICD-10-CM | POA: Insufficient documentation

## 2015-03-25 DIAGNOSIS — E662 Morbid (severe) obesity with alveolar hypoventilation: Secondary | ICD-10-CM | POA: Diagnosis not present

## 2015-03-25 DIAGNOSIS — G4761 Periodic limb movement disorder: Secondary | ICD-10-CM | POA: Diagnosis not present

## 2015-03-26 ENCOUNTER — Other Ambulatory Visit: Payer: Self-pay | Admitting: Family Medicine

## 2015-03-28 ENCOUNTER — Encounter: Payer: Self-pay | Admitting: Cardiology

## 2015-03-28 ENCOUNTER — Ambulatory Visit (INDEPENDENT_AMBULATORY_CARE_PROVIDER_SITE_OTHER): Payer: 59 | Admitting: Cardiology

## 2015-03-28 VITALS — BP 132/68 | HR 96 | Ht 69.0 in | Wt >= 6400 oz

## 2015-03-28 DIAGNOSIS — I5032 Chronic diastolic (congestive) heart failure: Secondary | ICD-10-CM | POA: Diagnosis not present

## 2015-03-28 MED ORDER — POTASSIUM CHLORIDE ER 20 MEQ PO TBCR: 40.0000 meq | EXTENDED_RELEASE_TABLET | Freq: Every day | ORAL | Status: DC

## 2015-03-28 MED ORDER — TORSEMIDE 20 MG PO TABS: 60.0000 mg | ORAL_TABLET | Freq: Two times a day (BID) | ORAL | Status: DC
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
## 2015-03-28 NOTE — Patient Instructions (Signed)
Medication Instructions:   INCREASE TORSEMIDE TO 60 MG TWICE DAILY= 3 OF THE 20 MG TABLETS TWICE DAILY  INCREASE POTASSIUM TO 40 MEQ ONCE DAILY= 2 OF THE 20 MEQ TABLETS ONCE DAILY  Labwork:  Your physician recommends that you HAVE LAB WORK TODAY   Your physician recommends that you return for lab work in: 7 TO 10 DAYS  Follow-Up:  Your physician recommends that you schedule a follow-up appointment in: [place]

## 2015-03-29 ENCOUNTER — Telehealth: Payer: Self-pay | Admitting: *Deleted

## 2015-03-29 DIAGNOSIS — I5032 Chronic diastolic (congestive) heart failure: Secondary | ICD-10-CM

## 2015-03-29 LAB — BASIC METABOLIC PANEL
BUN: 16 mg/dL (ref 7–25)
CO2: 41 mmol/L — ABNORMAL HIGH (ref 20–31)
Calcium: 8.9 mg/dL (ref 8.6–10.3)
Chloride: 90 mmol/L — ABNORMAL LOW (ref 98–110)
Creat: 1.1 mg/dL (ref 0.60–1.35)
Glucose, Bld: 94 mg/dL (ref 65–99)
POTASSIUM: 3.7 mmol/L (ref 3.5–5.3)
SODIUM: 143 mmol/L (ref 135–146)

## 2015-03-29 LAB — BRAIN NATRIURETIC PEPTIDE: BRAIN NATRIURETIC PEPTIDE: 154.1 pg/mL — AB (ref <100)

## 2015-03-29 NOTE — Telephone Encounter (Signed)
-----   Message from Consuelo Pandy, [place] sent at 03/29/2015 12:51 PM EST ----- BNP is slightly elevated, suggesting you are likely retaining fluid. Kidneys and potassium levels are stable. Continue with plans to increase Torsemide to 60 mg BID. Increase potassium to 40 mEq daily. Repeat BMP and in 5-7 days.

## 2015-03-30 ENCOUNTER — Telehealth: Payer: Self-pay | Admitting: Pulmonary Disease

## 2015-03-30 ENCOUNTER — Other Ambulatory Visit: Payer: Self-pay | Admitting: Internal Medicine

## 2015-03-30 NOTE — Telephone Encounter (Signed)
Called spoke with pt spouse. She reports pt had sleep study on 2/17 and is requesting results. Advised do not see they have been read yet and can take up to 2 weeks. Please advise Dr. Halford Chessman thanks

## 2015-03-30 NOTE — Telephone Encounter (Signed)
Patient Returned call  [phone]

## 2015-03-30 NOTE — Telephone Encounter (Signed)
LMOMTCB x 1 

## 2015-03-31 ENCOUNTER — Encounter: Payer: Self-pay | Admitting: Pulmonary Disease

## 2015-04-01 ENCOUNTER — Encounter: Payer: Self-pay | Admitting: Internal Medicine

## 2015-04-04 ENCOUNTER — Telehealth: Payer: Self-pay | Admitting: Pulmonary Disease

## 2015-04-04 DIAGNOSIS — J9611 Chronic respiratory failure with hypoxia: Secondary | ICD-10-CM

## 2015-04-04 DIAGNOSIS — E662 Morbid (severe) obesity with alveolar hypoventilation: Secondary | ICD-10-CM

## 2015-04-04 DIAGNOSIS — G4733 Obstructive sleep apnea (adult) (pediatric): Secondary | ICD-10-CM

## 2015-04-04 LAB — BASIC METABOLIC PANEL
BUN: 12 mg/dL (ref 7–25)
CO2: 38 mmol/L — ABNORMAL HIGH (ref 20–31)
Calcium: 8.7 mg/dL (ref 8.6–10.3)
Chloride: 88 mmol/L — ABNORMAL LOW (ref 98–110)
Creat: 0.81 mg/dL (ref 0.60–1.35)
Glucose, Bld: 115 mg/dL — ABNORMAL HIGH (ref 65–99)
Potassium: 3.3 mmol/L — ABNORMAL LOW (ref 3.5–5.3)
Sodium: 140 mmol/L (ref 135–146)

## 2015-04-04 NOTE — Telephone Encounter (Signed)
Spoke with pt about results on 04/04/15.  Will close note.

## 2015-04-04 NOTE — Telephone Encounter (Signed)
BiPAP titration 03/25/15 >> sub optimal titration in spite of being on 24/20 cm H2O with 4 liters oxygen.  Discussed with pt.  Explained that he needs to work on weight loss.  He might need ENT evaluation for tonsillectomy to assist with using PAP therapy.  Explained that if his sleep disordered breathing gets worse, then he might need to be considered for tracheostomy.  Will have [employer] change his home vent settings to Max IPAP 35 cm H2O, EPAP 10 to 20 cm H2O.  Will also have them arrange for humidification system for home vent.

## 2015-04-04 NOTE — Progress Notes (Signed)
Patient Name: Dean Bowers, Dean Bowers Date: 03/25/2015 Gender: Male D.O.B: 29-Dec-1970 Age (years): 76 Referring Provider: Chesley Mires MD, ABSM Height (inches): 54 Interpreting Physician: Chesley Mires MD, ABSM Weight (lbs): 400 RPSGT: Baxter Flattery BMI: 84 MRN: [id number] Neck Size: 22.50  CLINICAL INFORMATION 45 year old male with history of severe obstructive sleep apnea (PSG from 04/23/14 >> AHI 139.7) and obesity hypoventilation syndrome.  He has been using a Trilogy home ventilator with 3 liters supplemental oxygen, but felt that pressure settings weren't sufficient.  He would also like to transition off home vent to CPAP or BiPAP if possible.  SLEEP STUDY TECHNIQUE As per the AASM Manual for the Scoring of Sleep and Associated Events v2.3 (April 2016) with a hypopnea requiring 4% desaturations. The channels recorded and monitored were frontal, central and occipital EEG, electrooculogram (EOG), submentalis EMG (chin), nasal and oral airflow, thoracic and abdominal wall motion, anterior tibialis EMG, snore microphone, electrocardiogram, and pulse oximetry. Bilevel positive airway pressure (BPAP) was initiated at the beginning of the study and titrated to treat sleep-disordered breathing.  MEDICATIONS Medications administered by patient during sleep study : No sleep medicine administered.  RESPIRATORY PARAMETERS Optimal IPAP Pressure (cm): 24 AHI at Optimal Pressure (/hr) 34.8 Optimal EPAP Pressure (cm): 20     Overall Minimal O2 (%): 62.00 Minimal O2 at Optimal Pressure (%): 70.0  He continue to have respiratory events in spite of rather high BiPAP settings.  He continued to have oxygen desaturation in spite of being placed on 4 liters oxygen with BiPAP.  SLEEP ARCHITECTURE Start Time: 10:36:44 PM Stop Time: 5:06:34 AM Total Time (min): 389.8 Total Sleep Time (min): 364.9 Sleep Latency (min): 2.5 Sleep Efficiency (%): 93.6 REM Latency (min): N/A WASO (min): 22.5 Stage N1  (%): 1.10 Stage N2 (%): 98.90 Stage N3 (%): 0.00 Stage R (%): 0.00 Supine (%): 89.13 Arousal Index (/hr): 6.7      CARDIAC DATA The 2 lead EKG demonstrated sinus rhythm. The mean heart rate was 95.39 beats per minute. Other EKG findings include: None.  LEG MOVEMENT DATA The total Periodic Limb Movements of Sleep (PLMS) were 397. The PLMS index was 65.29. A PLMS index of <15 is considered normal in adults.  IMPRESSIONS This was a sub-optimal titration study.  He continued to have obstructive respiratory events and oxygen desaturation in spite of being tried on BiPAP 24/20 cm H2O with 4 liters supplemental oxygen.  He had an increase in his periodic limb movement index.  DIAGNOSIS - Obstructive Sleep Apnea (327.23 [G47.33 ICD-10]) - Nocturnal Hypoxemia (327.26 [G47.36 ICD-10])  RECOMMENDATIONS He should continue Trilogy home ventilator with supplemental oxygen.    He needs to continue efforts for weight loss.   Chesley Mires, MD, [place], American Board of Sleep Medicine 04/04/2015, 2:49 PM  NPI: [id number]

## 2015-04-05 ENCOUNTER — Telehealth: Payer: Self-pay | Admitting: Cardiology

## 2015-04-05 ENCOUNTER — Other Ambulatory Visit (INDEPENDENT_AMBULATORY_CARE_PROVIDER_SITE_OTHER): Payer: 59 | Admitting: *Deleted

## 2015-04-05 ENCOUNTER — Encounter: Payer: Self-pay | Admitting: Cardiology

## 2015-04-05 DIAGNOSIS — E785 Hyperlipidemia, unspecified: Secondary | ICD-10-CM

## 2015-04-05 DIAGNOSIS — E1169 Type 2 diabetes mellitus with other specified complication: Secondary | ICD-10-CM | POA: Diagnosis not present

## 2015-04-05 DIAGNOSIS — E876 Hypokalemia: Secondary | ICD-10-CM

## 2015-04-05 NOTE — Telephone Encounter (Signed)
Spoke with pt's wife, DPR on file. Informed wife that labs have no been reviewed by Lyda Jester, PA-C yet. Advised her of pt's low K+ and to have him take an extra dose of his K+ x 2 days, per our standing orders and plan to have labs redrawn on Monday while in the office to see Estella Husk, PA-C. Informed wife that we would call with official results once reviewed by Brittainy. Wife verbalized understanding.

## 2015-04-05 NOTE — Telephone Encounter (Signed)
New message ° ° ° ° °Calling for lab results °

## 2015-04-06 ENCOUNTER — Telehealth: Payer: Self-pay | Admitting: *Deleted

## 2015-04-06 DIAGNOSIS — I5032 Chronic diastolic (congestive) heart failure: Secondary | ICD-10-CM

## 2015-04-06 MED ORDER — POTASSIUM CHLORIDE ER 20 MEQ PO TBCR: 40.0000 meq | EXTENDED_RELEASE_TABLET | Freq: Two times a day (BID) | ORAL | Status: DC

## 2015-04-06 NOTE — Telephone Encounter (Signed)
Pt made aware of his lab results and to increase the Potassium to 40 meq bid. Pt verbalized understanding.

## 2015-04-06 NOTE — Telephone Encounter (Signed)
-----   Message from Consuelo Pandy, [place] sent at 04/06/2015  9:51 AM EST ----- Kidney function is stable with higher dose of diuretic. His K is a bit low. Notify patient to increase supplemental K-dur to 40 mEq BID.

## 2015-04-08 ENCOUNTER — Ambulatory Visit (INDEPENDENT_AMBULATORY_CARE_PROVIDER_SITE_OTHER): Payer: 59 | Admitting: Internal Medicine

## 2015-04-08 ENCOUNTER — Encounter: Payer: Self-pay | Admitting: Internal Medicine

## 2015-04-08 VITALS — BP 132/80 | HR 106 | Temp 98.7°F | Resp 20 | Wt >= 6400 oz

## 2015-04-08 DIAGNOSIS — Z720 Tobacco use: Secondary | ICD-10-CM

## 2015-04-08 DIAGNOSIS — F1910 Other psychoactive substance abuse, uncomplicated: Secondary | ICD-10-CM | POA: Diagnosis not present

## 2015-04-08 DIAGNOSIS — J36 Peritonsillar abscess: Secondary | ICD-10-CM

## 2015-04-08 DIAGNOSIS — S98911D Complete traumatic amputation of right foot, level unspecified, subsequent encounter: Secondary | ICD-10-CM | POA: Diagnosis not present

## 2015-04-08 DIAGNOSIS — F17200 Nicotine dependence, unspecified, uncomplicated: Secondary | ICD-10-CM

## 2015-04-08 MED ORDER — OXYCODONE HCL 5 MG PO CAPS: 5.0000 mg | ORAL_CAPSULE | Freq: Two times a day (BID) | ORAL | Status: DC | PRN

## 2015-04-08 NOTE — Patient Instructions (Signed)
We will check the urine today again. We have switched to the oxycodone without the tylenol in it.

## 2015-04-08 NOTE — Progress Notes (Signed)
Pre visit review using our clinic review tool, if applicable. No additional management support is needed unless otherwise documented below in the visit note. 

## 2015-04-09 NOTE — Assessment & Plan Note (Signed)
He states weight is down 2 pounds but refused to get on the scale not wanting to stand but then afterwards walked around the office to restroom.

## 2015-04-09 NOTE — Assessment & Plan Note (Signed)
He is very high risk for pain abuse given his recent history of cocaine and ongoing marijuana. Last UDS was negative for opiates and this was discussed with him in detail today and the fact that if he has continuing negative screens we will not be able to prescribe any controlled substances and he will be referred to pain management. Rx today for oxycodone and UDS. He states last 2 days ago so possible negative test today. If so needs random call about UDS in 1-2 weeks.

## 2015-04-09 NOTE — Assessment & Plan Note (Signed)
He has been in the hospital since last visit for this and was recommended surgery which he choose not to undergo. He completed augmentin and states much improved. Still some redness on exam of the left peritonsillar area but no abscess on exam.

## 2015-04-09 NOTE — Assessment & Plan Note (Signed)
No interest in quitting today.

## 2015-04-09 NOTE — Assessment & Plan Note (Signed)
Still amputated, well fitting prosthesis.

## 2015-04-09 NOTE — Progress Notes (Signed)
   Subjective:    Patient ID: Dean Bowers, male    DOB: September 27, 1970, 45 y.o.   MRN: [id number]  HPI The patient is a 45 YO man coming in for follow up of his chronic pain. He is requesting pain medication increase today. Does not take everyday but when he is having a bad day he can take 2-3 pills. Needs refill and feels he did better with the oxycodone without the tylenol in it. Felt like it worked better. Denies using cocaine since our last visit although he still uses marijuana as alternative pain control. Working with weight loss although he did not choose to get on our scale and instead self-reported his weight. Goal is 50 pound loss.   Additionally he was in the hospital in the last several weeks (peritonsillar abscess concerning for airway blockage recommended I and D operative which he declines then left hospital AMA). He did complete the augmentin he was given and states he is doing better, no food getting stuck or swelling in his throat. Breathing well.   Review of Systems  Constitutional: Positive for activity change and fatigue. Negative for fever, chills, appetite change and unexpected weight change.  HENT: Negative.   Eyes: Negative.   Respiratory: Negative for cough, chest tightness, shortness of breath and wheezing.   Cardiovascular: Positive for leg swelling. Negative for chest pain and palpitations.  Gastrointestinal: Negative for nausea, abdominal pain, diarrhea, constipation and abdominal distention.  Musculoskeletal: Positive for back pain and arthralgias. Negative for myalgias and gait problem.  Skin:       tattoos  Neurological: Negative.   Psychiatric/Behavioral: Negative.       Objective:   Physical Exam  Constitutional: He appears well-developed and well-nourished.  Obese, with many tattoos  HENT:  Head: Normocephalic and atraumatic.  Eyes: EOM are normal.  Neck: Normal range of motion.  Cardiovascular: Normal rate and regular rhythm.   Pulmonary/Chest:  Effort normal and breath sounds normal. No respiratory distress. He has no wheezes. He has no rales. He exhibits no tenderness.  Abdominal: Soft. Bowel sounds are normal. He exhibits no distension. There is no tenderness. There is no rebound.  Musculoskeletal:  Right leg amputated below the knee with prosthesis in place.  Neurological: Coordination normal.  Skin: Skin is warm and dry.  Psychiatric: He has a normal mood and affect.   Filed Vitals:   04/08/15 1501  BP: 132/80  Pulse: 106  Temp: 98.7 F (37.1 C)  TempSrc: Oral  Resp: 20  Weight: 416 lb (188.696 kg)  SpO2: 91%      Assessment & Plan:

## 2015-04-11 ENCOUNTER — Encounter: Payer: Self-pay | Admitting: Nurse Practitioner

## 2015-04-11 ENCOUNTER — Encounter: Payer: Self-pay | Admitting: Physician Assistant

## 2015-04-11 ENCOUNTER — Ambulatory Visit (INDEPENDENT_AMBULATORY_CARE_PROVIDER_SITE_OTHER): Payer: 59 | Admitting: Physician Assistant

## 2015-04-11 ENCOUNTER — Other Ambulatory Visit (INDEPENDENT_AMBULATORY_CARE_PROVIDER_SITE_OTHER): Payer: 59 | Admitting: *Deleted

## 2015-04-11 VITALS — BP 138/80 | HR 114 | Ht 67.0 in | Wt >= 6400 oz

## 2015-04-11 DIAGNOSIS — I5032 Chronic diastolic (congestive) heart failure: Secondary | ICD-10-CM

## 2015-04-11 DIAGNOSIS — I10 Essential (primary) hypertension: Secondary | ICD-10-CM

## 2015-04-11 DIAGNOSIS — I2699 Other pulmonary embolism without acute cor pulmonale: Secondary | ICD-10-CM

## 2015-04-11 DIAGNOSIS — E876 Hypokalemia: Secondary | ICD-10-CM

## 2015-04-11 LAB — BASIC METABOLIC PANEL
BUN: 24 mg/dL (ref 7–25)
CHLORIDE: 90 mmol/L — AB (ref 98–110)
CO2: 38 mmol/L — ABNORMAL HIGH (ref 20–31)
CREATININE: 1.07 mg/dL (ref 0.60–1.35)
Calcium: 8.8 mg/dL (ref 8.6–10.3)
Glucose, Bld: 222 mg/dL — ABNORMAL HIGH (ref 65–99)
Potassium: 3.8 mmol/L (ref 3.5–5.3)
Sodium: 137 mmol/L (ref 135–146)

## 2015-04-11 MED ORDER — METOLAZONE 2.5 MG PO TABS: 2.5000 mg | ORAL_TABLET | Freq: Every day | ORAL | Status: DC

## 2015-04-11 MED ORDER — POTASSIUM CHLORIDE ER 20 MEQ PO TBCR: 40.0000 meq | EXTENDED_RELEASE_TABLET | Freq: Two times a day (BID) | ORAL | Status: DC

## 2015-04-11 MED ORDER — METOLAZONE 2.5 MG PO TABS: 2.5000 mg | ORAL_TABLET | Freq: Every day | ORAL | Status: DC | PRN

## 2015-04-11 NOTE — Assessment & Plan Note (Signed)
Patient is on Xarelto 

## 2015-04-11 NOTE — Assessment & Plan Note (Signed)
Blood pressure stable ? ?

## 2015-04-11 NOTE — Assessment & Plan Note (Signed)
Trying to start a weight loss program
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
## 2015-04-11 NOTE — Patient Instructions (Addendum)
Medication Instructions:   START TAKING ZAROXLYN 2.5 ONCE A DAY  FOR THREE DAYS THEN  TAKE THREE TIMES A WEEK ONLY   TAKE AN POTASSIUM TABLET WHEN YOU TAKE XAROXLYN   If you need a refill on your cardiac medications before your next appointment, please call your pharmacy.  Labwork:  NONE ORDER TODAY    Testing/Procedures:  NONE ORDER TODAY    Follow-Up:   WITH A NURSE VISIT Wednesday FOR A WEIGHT CHECK   WITH DR HARDING IN 3 TO 4 WEEKS OR WITH AN APP ON A DAY DR HARDING IS IN OFFICE    Any Other Special Instructions Will Be Listed Below (If Applicable).

## 2015-04-11 NOTE — Assessment & Plan Note (Signed)
Patient has a 35 pound weight gain since discharge from the hospital, 17 pounds from when he saw Ellen Henri, [place] on 03/28/15. He weighs 435 pounds today. Patient is adamant about not coming back to the hospital today. He was ended up on the ventilator and was told he may need a trach next time he comes in. He has a hard time weighing on his scales at home. I will give him Zaroxolyn 2.5 mg for the next 3 days. Then decrease Zaroxolyn to 2.5 mg 3 times a week. Increase potassium to 40 mEq 3 times a day on days that he takes Zaroxolyn. He will come back to our office Wednesday to get reweighed. If he does not diurese he needs to be readmitted for IV diuresis. Check labs in 1 week. Follow-up with Dr. Ellyn Hack next week.

## 2015-04-12 NOTE — Addendum Note (Signed)
Addended by: Freada Bergeron on: 04/12/2015 05:34 PM   Modules accepted: Orders

## 2015-04-13 ENCOUNTER — Emergency Department (HOSPITAL_COMMUNITY): Payer: 59

## 2015-04-13 ENCOUNTER — Inpatient Hospital Stay (HOSPITAL_COMMUNITY)
Admission: EM | Admit: 2015-04-13 | Discharge: 2015-04-18 | DRG: 208 | Disposition: A | Discharge status: Home / Self Care | Payer: 59 | Attending: Internal Medicine | Admitting: Internal Medicine

## 2015-04-13 ENCOUNTER — Encounter (HOSPITAL_COMMUNITY): Payer: Self-pay | Admitting: Vascular Surgery

## 2015-04-13 ENCOUNTER — Encounter: Payer: Self-pay | Admitting: Physician Assistant

## 2015-04-13 ENCOUNTER — Ambulatory Visit (INDEPENDENT_AMBULATORY_CARE_PROVIDER_SITE_OTHER): Payer: 59

## 2015-04-13 ENCOUNTER — Telehealth: Payer: Self-pay | Admitting: *Deleted

## 2015-04-13 VITALS — BP 136/62 | HR 107 | Resp 22 | Ht 69.0 in | Wt >= 6400 oz

## 2015-04-13 DIAGNOSIS — Z7901 Long term (current) use of anticoagulants: Secondary | ICD-10-CM | POA: Diagnosis not present

## 2015-04-13 DIAGNOSIS — G9341 Metabolic encephalopathy: Secondary | ICD-10-CM | POA: Diagnosis present

## 2015-04-13 DIAGNOSIS — Z825 Family history of asthma and other chronic lower respiratory diseases: Secondary | ICD-10-CM | POA: Diagnosis not present

## 2015-04-13 DIAGNOSIS — Z89511 Acquired absence of right leg below knee: Secondary | ICD-10-CM | POA: Diagnosis not present

## 2015-04-13 DIAGNOSIS — Z86718 Personal history of other venous thrombosis and embolism: Secondary | ICD-10-CM | POA: Diagnosis not present

## 2015-04-13 DIAGNOSIS — J9622 Acute and chronic respiratory failure with hypercapnia: Secondary | ICD-10-CM | POA: Diagnosis present

## 2015-04-13 DIAGNOSIS — J9690 Respiratory failure, unspecified, unspecified whether with hypoxia or hypercapnia: Secondary | ICD-10-CM | POA: Diagnosis present

## 2015-04-13 DIAGNOSIS — I5033 Acute on chronic diastolic (congestive) heart failure: Secondary | ICD-10-CM | POA: Insufficient documentation

## 2015-04-13 DIAGNOSIS — G4733 Obstructive sleep apnea (adult) (pediatric): Secondary | ICD-10-CM

## 2015-04-13 DIAGNOSIS — J9621 Acute and chronic respiratory failure with hypoxia: Secondary | ICD-10-CM | POA: Diagnosis present

## 2015-04-13 DIAGNOSIS — R0602 Shortness of breath: Secondary | ICD-10-CM | POA: Insufficient documentation

## 2015-04-13 DIAGNOSIS — Z841 Family history of disorders of kidney and ureter: Secondary | ICD-10-CM

## 2015-04-13 DIAGNOSIS — I5043 Acute on chronic combined systolic (congestive) and diastolic (congestive) heart failure: Secondary | ICD-10-CM | POA: Diagnosis present

## 2015-04-13 DIAGNOSIS — E662 Morbid (severe) obesity with alveolar hypoventilation: Secondary | ICD-10-CM | POA: Diagnosis present

## 2015-04-13 DIAGNOSIS — I110 Hypertensive heart disease with heart failure: Secondary | ICD-10-CM | POA: Diagnosis present

## 2015-04-13 DIAGNOSIS — N179 Acute kidney failure, unspecified: Secondary | ICD-10-CM | POA: Diagnosis present

## 2015-04-13 DIAGNOSIS — Z7982 Long term (current) use of aspirin: Secondary | ICD-10-CM | POA: Diagnosis not present

## 2015-04-13 DIAGNOSIS — I2781 Cor pulmonale (chronic): Secondary | ICD-10-CM | POA: Diagnosis present

## 2015-04-13 DIAGNOSIS — E785 Hyperlipidemia, unspecified: Secondary | ICD-10-CM | POA: Diagnosis present

## 2015-04-13 DIAGNOSIS — L03116 Cellulitis of left lower limb: Secondary | ICD-10-CM | POA: Diagnosis present

## 2015-04-13 DIAGNOSIS — E1151 Type 2 diabetes mellitus with diabetic peripheral angiopathy without gangrene: Secondary | ICD-10-CM | POA: Diagnosis present

## 2015-04-13 DIAGNOSIS — Z86711 Personal history of pulmonary embolism: Secondary | ICD-10-CM

## 2015-04-13 DIAGNOSIS — Z79899 Other long term (current) drug therapy: Secondary | ICD-10-CM

## 2015-04-13 DIAGNOSIS — Z6841 Body Mass Index (BMI) 40.0 and over, adult: Secondary | ICD-10-CM

## 2015-04-13 DIAGNOSIS — E874 Mixed disorder of acid-base balance: Secondary | ICD-10-CM | POA: Diagnosis present

## 2015-04-13 DIAGNOSIS — Z9289 Personal history of other medical treatment: Secondary | ICD-10-CM

## 2015-04-13 DIAGNOSIS — S98911A Complete traumatic amputation of right foot, level unspecified, initial encounter: Secondary | ICD-10-CM

## 2015-04-13 DIAGNOSIS — J449 Chronic obstructive pulmonary disease, unspecified: Secondary | ICD-10-CM | POA: Diagnosis present

## 2015-04-13 DIAGNOSIS — E876 Hypokalemia: Secondary | ICD-10-CM | POA: Diagnosis present

## 2015-04-13 DIAGNOSIS — G8929 Other chronic pain: Secondary | ICD-10-CM | POA: Diagnosis present

## 2015-04-13 DIAGNOSIS — F17210 Nicotine dependence, cigarettes, uncomplicated: Secondary | ICD-10-CM | POA: Diagnosis present

## 2015-04-13 DIAGNOSIS — Z8249 Family history of ischemic heart disease and other diseases of the circulatory system: Secondary | ICD-10-CM

## 2015-04-13 DIAGNOSIS — E1165 Type 2 diabetes mellitus with hyperglycemia: Secondary | ICD-10-CM | POA: Diagnosis present

## 2015-04-13 DIAGNOSIS — Z833 Family history of diabetes mellitus: Secondary | ICD-10-CM | POA: Diagnosis not present

## 2015-04-13 DIAGNOSIS — Z888 Allergy status to other drugs, medicaments and biological substances status: Secondary | ICD-10-CM

## 2015-04-13 DIAGNOSIS — Z9981 Dependence on supplemental oxygen: Secondary | ICD-10-CM

## 2015-04-13 DIAGNOSIS — J810 Acute pulmonary edema: Secondary | ICD-10-CM | POA: Diagnosis not present

## 2015-04-13 DIAGNOSIS — I5032 Chronic diastolic (congestive) heart failure: Secondary | ICD-10-CM

## 2015-04-13 LAB — CBC
HEMATOCRIT: 39.7 % (ref 39.0–52.0)
HEMOGLOBIN: 10.8 g/dL — AB (ref 13.0–17.0)
MCH: 20.1 pg — ABNORMAL LOW (ref 26.0–34.0)
MCHC: 27.2 g/dL — ABNORMAL LOW (ref 30.0–36.0)
MCV: 73.8 fL — AB (ref 78.0–100.0)
Platelets: 301 10*3/uL (ref 150–400)
RBC: 5.38 MIL/uL (ref 4.22–5.81)
RDW: 20.9 % — ABNORMAL HIGH (ref 11.5–15.5)
WBC: 15.9 10*3/uL — AB (ref 4.0–10.5)

## 2015-04-13 LAB — BLOOD GAS, ARTERIAL
ACID-BASE EXCESS: 17 mmol/L — AB (ref 0.0–2.0)
Bicarbonate: 44.2 mEq/L — ABNORMAL HIGH (ref 20.0–24.0)
DRAWN BY: 365291
O2 Content: 3 L/min
O2 SAT: 89.2 %
PATIENT TEMPERATURE: 98.6
PO2 ART: 65 mmHg — AB (ref 80.0–100.0)
TCO2: 47.2 mmol/L (ref 0–100)
pCO2 arterial: 96.3 mmHg (ref 35.0–45.0)
pH, Arterial: 7.284 — ABNORMAL LOW (ref 7.350–7.450)

## 2015-04-13 LAB — POCT I-STAT 3, ART BLOOD GAS (G3+)
ACID-BASE EXCESS: 19 mmol/L — AB (ref 0.0–2.0)
Bicarbonate: 48.7 mEq/L — ABNORMAL HIGH (ref 20.0–24.0)
O2 SAT: 100 %
PCO2 ART: 79.9 mmHg — AB (ref 35.0–45.0)
PH ART: 7.393 (ref 7.350–7.450)
pO2, Arterial: 209 mmHg — ABNORMAL HIGH (ref 80.0–100.0)

## 2015-04-13 LAB — BASIC METABOLIC PANEL
Anion gap: 16 — ABNORMAL HIGH (ref 5–15)
BUN: 33 mg/dL — AB (ref 6–20)
CHLORIDE: 82 mmol/L — AB (ref 101–111)
CO2: 38 mmol/L — AB (ref 22–32)
Calcium: 9.2 mg/dL (ref 8.9–10.3)
Creatinine, Ser: 1.58 mg/dL — ABNORMAL HIGH (ref 0.61–1.24)
GFR calc Af Amer: 59 mL/min — ABNORMAL LOW (ref >60)
GFR calc non Af Amer: 51 mL/min — ABNORMAL LOW (ref >60)
Glucose, Bld: 215 mg/dL — ABNORMAL HIGH (ref 65–99)
POTASSIUM: 3.3 mmol/L — AB (ref 3.5–5.1)
SODIUM: 136 mmol/L (ref 135–145)

## 2015-04-13 LAB — MAGNESIUM: Magnesium: 2.1 mg/dL (ref 1.7–2.4)

## 2015-04-13 LAB — I-STAT TROPONIN, ED: Troponin i, poc: 0 ng/mL (ref 0.00–0.08)

## 2015-04-13 LAB — URINALYSIS, ROUTINE W REFLEX MICROSCOPIC
BILIRUBIN URINE: NEGATIVE
Glucose, UA: NEGATIVE mg/dL
Hgb urine dipstick: NEGATIVE
Ketones, ur: NEGATIVE mg/dL
Leukocytes, UA: NEGATIVE
NITRITE: NEGATIVE
PROTEIN: NEGATIVE mg/dL
SPECIFIC GRAVITY, URINE: 1.011 (ref 1.005–1.030)
pH: 6 (ref 5.0–8.0)

## 2015-04-13 LAB — LACTIC ACID, PLASMA: Lactic Acid, Venous: 1.9 mmol/L (ref 0.5–2.0)

## 2015-04-13 LAB — PHOSPHORUS: PHOSPHORUS: 4.8 mg/dL — AB (ref 2.5–4.6)

## 2015-04-13 LAB — PROCALCITONIN: PROCALCITONIN: 0.17 ng/mL

## 2015-04-13 LAB — TROPONIN I: TROPONIN I: 0.03 ng/mL (ref <0.031)

## 2015-04-13 LAB — APTT: APTT: 44 s — AB (ref 24–37)

## 2015-04-13 LAB — HEPARIN LEVEL (UNFRACTIONATED): Heparin Unfractionated: >2.2 IU/mL — ABNORMAL HIGH (ref 0.30–0.70)

## 2015-04-13 MED ORDER — SODIUM CHLORIDE 0.9 % IV SOLN
250.0000 mL | INTRAVENOUS | Status: DC | PRN
  Administered 2015-04-13: 250 mL via INTRAVENOUS

## 2015-04-13 MED ORDER — SODIUM CHLORIDE 0.9 % IV BOLUS (SEPSIS)
500.0000 mL | Freq: Once | INTRAVENOUS | Status: AC
Start: 2015-04-13 — End: 2015-04-13
  Administered 2015-04-13: 500 mL via INTRAVENOUS

## 2015-04-13 MED ORDER — ETOMIDATE 2 MG/ML IV SOLN
INTRAVENOUS | Status: AC | PRN
  Administered 2015-04-13: 10 mg via INTRAVENOUS
  Administered 2015-04-13: 20 mg via INTRAVENOUS

## 2015-04-13 MED ORDER — SODIUM CHLORIDE 0.9% FLUSH
3.0000 mL | Freq: Two times a day (BID) | INTRAVENOUS | Status: DC
  Administered 2015-04-14 – 2015-04-17 (×7): 3 mL via INTRAVENOUS

## 2015-04-13 MED ORDER — CHLORHEXIDINE GLUCONATE 0.12% ORAL RINSE (MEDLINE KIT)
15.0000 mL | Freq: Two times a day (BID) | OROMUCOSAL | Status: DC
  Administered 2015-04-13 – 2015-04-15 (×4): 15 mL via OROMUCOSAL

## 2015-04-13 MED ORDER — INSULIN ASPART 100 UNIT/ML ~~LOC~~ SOLN
2.0000 [IU] | SUBCUTANEOUS | Status: DC
  Administered 2015-04-14 (×2): 2 [IU] via SUBCUTANEOUS
  Administered 2015-04-14: 4 [IU] via SUBCUTANEOUS
  Administered 2015-04-14 – 2015-04-15 (×4): 2 [IU] via SUBCUTANEOUS
  Administered 2015-04-15: 4 [IU] via SUBCUTANEOUS
  Administered 2015-04-15: 2 [IU] via SUBCUTANEOUS
  Administered 2015-04-15: 4 [IU] via SUBCUTANEOUS
  Administered 2015-04-15 – 2015-04-16 (×2): 2 [IU] via SUBCUTANEOUS
  Administered 2015-04-16: 6 [IU] via SUBCUTANEOUS
  Administered 2015-04-16 – 2015-04-18 (×9): 2 [IU] via SUBCUTANEOUS

## 2015-04-13 MED ORDER — MIDAZOLAM HCL 2 MG/2ML IJ SOLN
2.0000 mg | Freq: Once | INTRAMUSCULAR | Status: AC
  Administered 2015-04-13: 2 mg via INTRAVENOUS
  Filled 2015-04-13: qty 2

## 2015-04-13 MED ORDER — POTASSIUM CHLORIDE 10 MEQ/100ML IV SOLN
10.0000 meq | INTRAVENOUS | Status: AC
  Administered 2015-04-13 – 2015-04-14 (×2): 10 meq via INTRAVENOUS
  Filled 2015-04-13 (×2): qty 100

## 2015-04-13 MED ORDER — PROPOFOL 1000 MG/100ML IV EMUL
INTRAVENOUS | Status: AC
  Administered 2015-04-13: 5 ug/kg/min via INTRAVENOUS
  Filled 2015-04-13: qty 100

## 2015-04-13 MED ORDER — PANTOPRAZOLE SODIUM 40 MG IV SOLR
40.0000 mg | Freq: Every day | INTRAVENOUS | Status: DC
  Administered 2015-04-14 (×2): 40 mg via INTRAVENOUS
  Filled 2015-04-13 (×3): qty 40

## 2015-04-13 MED ORDER — SODIUM CHLORIDE 0.9% FLUSH: 3.0000 mL | INTRAVENOUS | Status: DC | PRN

## 2015-04-13 MED ORDER — ANTISEPTIC ORAL RINSE SOLUTION (CORINZ)
7.0000 mL | Freq: Four times a day (QID) | OROMUCOSAL | Status: DC
  Administered 2015-04-14 – 2015-04-15 (×7): 7 mL via OROMUCOSAL

## 2015-04-13 MED ORDER — FENTANYL CITRATE (PF) 100 MCG/2ML IJ SOLN
100.0000 ug | Freq: Once | INTRAMUSCULAR | Status: AC
  Administered 2015-04-13: 100 ug via INTRAVENOUS
  Filled 2015-04-13: qty 2

## 2015-04-13 MED ORDER — HEPARIN SODIUM (PORCINE) 5000 UNIT/ML IJ SOLN
5000.0000 [IU] | Freq: Three times a day (TID) | INTRAMUSCULAR | Status: DC
  Administered 2015-04-13: 5000 [IU] via SUBCUTANEOUS
  Filled 2015-04-13: qty 1

## 2015-04-13 MED ORDER — PROPOFOL 1000 MG/100ML IV EMUL
5.0000 ug/kg/min | INTRAVENOUS | Status: DC
  Administered 2015-04-13: 20 ug/kg/min via INTRAVENOUS
  Administered 2015-04-14 (×2): 15 ug/kg/min via INTRAVENOUS
  Filled 2015-04-13 (×2): qty 100

## 2015-04-13 MED ORDER — BUDESONIDE 0.5 MG/2ML IN SUSP
0.5000 mg | Freq: Two times a day (BID) | RESPIRATORY_TRACT | Status: DC
Start: 2015-04-13 — End: 2015-04-18
  Administered 2015-04-13 – 2015-04-18 (×10): 0.5 mg via RESPIRATORY_TRACT
  Filled 2015-04-13 (×11): qty 2

## 2015-04-13 MED ORDER — SODIUM CHLORIDE 0.9 % IV SOLN
250.0000 mL | INTRAVENOUS | Status: DC | PRN
  Administered 2015-04-14: 10 mL/h via INTRAVENOUS

## 2015-04-13 MED ORDER — IPRATROPIUM-ALBUTEROL 0.5-2.5 (3) MG/3ML IN SOLN
3.0000 mL | Freq: Four times a day (QID) | RESPIRATORY_TRACT | Status: DC
  Administered 2015-04-13 – 2015-04-16 (×12): 3 mL via RESPIRATORY_TRACT
  Filled 2015-04-13 (×12): qty 3

## 2015-04-13 MED ORDER — PROPOFOL 1000 MG/100ML IV EMUL
5.0000 ug/kg/min | Freq: Once | INTRAVENOUS | Status: AC
  Administered 2015-04-13: 5 ug/kg/min via INTRAVENOUS

## 2015-04-13 NOTE — ED Notes (Signed)
Pt decreased to 2L, 95% O2

## 2015-04-13 NOTE — ED Notes (Signed)
MD at bedside. 
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
## 2015-04-13 NOTE — ED Notes (Signed)
IV attempt x2, another RN to attempt.   

## 2015-04-13 NOTE — ED Notes (Signed)
Critical Care at bedside.  

## 2015-04-13 NOTE — ED Notes (Signed)
500 cc bolus per [place].

## 2015-04-13 NOTE — Progress Notes (Signed)
Patient transported from Emergency Dept Room D33 to [id number] without complications.

## 2015-04-13 NOTE — Patient Instructions (Signed)
You are instructed to go straight to the emergency department at [facility].

## 2015-04-13 NOTE — ED Notes (Signed)
Pt becoming more lethargic, responds to painful stimuli, MD aware at bedside.
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
## 2015-04-13 NOTE — ED Notes (Signed)
2nd RN attempting IV access

## 2015-04-13 NOTE — ED Provider Notes (Signed)
Severe fluid retention - hx of CHF Sent from the cardiology office for Hospital diuresis The patient is morbidly obese, right sided lower extremity amputation, left sided significant pitting edema, anasarca going all the way up to the level of the chest, requiring oxygen for hypoxia, patient will need to be admitted for diuresis. Labs ordered, recently had torsemide increased to 3 times a day as well as metolazone added   EKG Interpretation  Date/Time:  Wednesday April 13 2015 15:44:30 EST Ventricular Rate:  103 PR Interval:  128 QRS Duration: 100 QT Interval:  360 QTC Calculation: 471 R Axis:   105 Text Interpretation:  Sinus tachycardia Rightward axis Nonspecific T wave abnormality Abnormal ECG ED PHYSICIAN INTERPRETATION AVAILABLE IN [facility] Confirmed by TEST, Record ([id number]) on 04/14/2015 8:17:53 AM      The pt had a progressive SOB that left him needing CPAP and eventually intubation by critical care - he will go the ICU - the pt will likely need to be trached.  He has been on cardiac and respiratory monitroing througohut his stay and has had a progressive decline in his mental status.  Critically ill.  CRITICAL CARE Performed by: Johnna Acosta Total critical care time: 35  minutes Critical care time was exclusive of separately billable procedures and treating other patients. Critical care was necessary to treat or prevent imminent or life-threatening deterioration. Critical care was time spent personally by me on the following activities: development of treatment plan with patient and/or surrogate as well as nursing, discussions with consultants, evaluation of patient's response to treatment, examination of patient, obtaining history from patient or surrogate, ordering and performing treatments and interventions, ordering and review of laboratory studies, ordering and review of radiographic studies, pulse oximetry and re-evaluation of patient's condition.   Final diagnoses:   SOB (shortness of breath)  Acute on chronic respiratory failure with hypoxia and hypercapnia (HCC)   I saw and evaluated the patient, reviewed the resident's note and I agree with the findings and plan.   I personally interpreted the EKG as well as the resident and agree with the interpretation on the resident's chart.  Final diagnoses:  SOB (shortness of breath)  Acute on chronic respiratory failure with hypoxia and hypercapnia (HCC)        Noemi Chapel, MD 04/15/15 2147

## 2015-04-13 NOTE — Progress Notes (Signed)
1.) Reason for visit: Nurse visit for weight check  2.) Name of MD requesting visit: Ermalinda Barrios PA  3.) H&P: Patient has history of CHF, hyperlipidemia, PE, DVT, COPD, OSA/OHS on C pap,  right BKA, and HTN. Recently admitted in hospital in January for acute respiratory failure with hypoxia in the setting of acute CHF exacerbation. Patient had office visit on 04/11/15 with Ermalinda Barrios PA. Patient was instructed to take metolazone 2.5 mg by mouth for three days, and have a nurse's visit today for a weight check. Today is the third day. Patient does not think the new medication has helped. Patient has gained 4 pounds since Monday. Patient is short of breath with PO2 of 80%. Patient has diminished lung sounds.  4.) ROS related to problem: Patient does not think the new medication has helped. Patient has gained 4 pounds since Monday. Patient is short of breath with O2 of 80%. Patient has diminished lung sounds. Patient VS BP 136/62, HR 107, Resp. 22 , and PO2 sat at 80% on room air. Patient states he has home oxygen and his O2 is always low.   5.) Assessment and plan per MD: Consulted DOD, Dr. Marlou Porch, he recommends patient to go to ED to be admitted by hospitalist with the multiple issues the patient is having at this time. Informed patient of Dr. Marlou Porch instructions and patient will go to ED.

## 2015-04-13 NOTE — Procedures (Signed)
Intubation Procedure Note Dean Bowers [id number] [birth year]  Procedure: Intubation Indications: Airway protection and maintenance  Procedure Details Consent: Risks of procedure as well as the alternatives and risks of each were explained to the (patient/caregiver).  Consent for procedure obtained. Obtained from wife.  Time Out: Verified patient identification, verified procedure, site/side was marked, verified correct patient position, special equipment/implants available, medications/allergies/relevent history reviewed, required imaging and test results available.   Maximum sterile technique was used including cap, gloves, hand hygiene and mask.  MAC and 3 Glidescope was used to visualize airway.  Pt was intubated with Glidescope use. Meds received : Versed 2 mg IV and Etomidate 20 mg IV -- both in divided doses.    Evaluation Hemodynamic Status: Transient hypotension treated with fluid; O2 sats: stable throughout Patient's Current Condition: stable Complications: No apparent complications Patient did tolerate procedure well. Chest X-ray ordered to verify placement.  CXR: pending.   [place] 04/13/2015

## 2015-04-13 NOTE — ED Notes (Signed)
Xray and RT at bedside

## 2015-04-13 NOTE — Telephone Encounter (Signed)
Called in response in email.  He states that he doesn't feel any worse.  Unable to weigh at home due to small scale and with his prothesis he can't get accurate wt so he is coming in today to be weighed.  He states he wants to avoid the hospital because he has cellulitis in his leg so he doesn't want to get an infections.  Also wanted to get results of labs drawn yesterday.  Advised that Lyda Jester, PA hasn't reviewed at this time. He will be in office at 2:00 for wt check.

## 2015-04-13 NOTE — ED Notes (Signed)
Pt reports to the ED for eval of SOB, weight gain, and hypoxia. Pt has gained approx 40 lbs in one month. Pt went to see his PCP and was found to be hypoxic in the 60s. Pt denies any CP. Pt is on diuretics and was started on a new diuretic on Monday but he has continued to gain weight. Pt A&Ox4, resp e/u, and skin warm and dry.

## 2015-04-13 NOTE — ED Notes (Signed)
CC at bedside preparing to intubate.

## 2015-04-13 NOTE — ED Notes (Signed)
Pt remains monitored by blood pressure, pulse ox, and 5 lead. Pt is noted to have visitor at bedside.
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
## 2015-04-14 DIAGNOSIS — E662 Morbid (severe) obesity with alveolar hypoventilation: Secondary | ICD-10-CM

## 2015-04-14 LAB — BASIC METABOLIC PANEL
Anion gap: 14 (ref 5–15)
Anion gap: 12 (ref 5–15)
Anion gap: 16 — ABNORMAL HIGH (ref 5–15)
BUN: 30 mg/dL — ABNORMAL HIGH (ref 6–20)
BUN: 25 mg/dL — AB (ref 6–20)
BUN: 26 mg/dL — AB (ref 6–20)
CALCIUM: 8.9 mg/dL (ref 8.9–10.3)
CALCIUM: 9.1 mg/dL (ref 8.9–10.3)
CALCIUM: 9 mg/dL (ref 8.9–10.3)
CO2: 40 mmol/L — AB (ref 22–32)
CO2: 43 mmol/L — AB (ref 22–32)
CO2: 38 mmol/L — AB (ref 22–32)
CREATININE: 1.21 mg/dL (ref 0.61–1.24)
CREATININE: 1.05 mg/dL (ref 0.61–1.24)
Chloride: 84 mmol/L — ABNORMAL LOW (ref 101–111)
Chloride: 87 mmol/L — ABNORMAL LOW (ref 101–111)
Chloride: 87 mmol/L — ABNORMAL LOW (ref 101–111)
Creatinine, Ser: 1.07 mg/dL (ref 0.61–1.24)
GFR calc Af Amer: >60 mL/min (ref >60)
GFR calc Af Amer: >60 mL/min (ref >60)
GFR calc non Af Amer: >60 mL/min (ref >60)
GLUCOSE: 130 mg/dL — AB (ref 65–99)
Glucose, Bld: 120 mg/dL — ABNORMAL HIGH (ref 65–99)
Glucose, Bld: 156 mg/dL — ABNORMAL HIGH (ref 65–99)
Potassium: 2.8 mmol/L — ABNORMAL LOW (ref 3.5–5.1)
Potassium: 3.7 mmol/L (ref 3.5–5.1)
Potassium: 3.3 mmol/L — ABNORMAL LOW (ref 3.5–5.1)
SODIUM: 138 mmol/L (ref 135–145)
Sodium: 142 mmol/L (ref 135–145)
Sodium: 141 mmol/L (ref 135–145)

## 2015-04-14 LAB — CBC
HEMATOCRIT: 36.5 % — AB (ref 39.0–52.0)
Hemoglobin: 10 g/dL — ABNORMAL LOW (ref 13.0–17.0)
MCH: 19.9 pg — ABNORMAL LOW (ref 26.0–34.0)
MCHC: 27.4 g/dL — AB (ref 30.0–36.0)
MCV: 72.6 fL — ABNORMAL LOW (ref 78.0–100.0)
PLATELETS: 257 10*3/uL (ref 150–400)
RBC: 5.03 MIL/uL (ref 4.22–5.81)
RDW: 20.9 % — AB (ref 11.5–15.5)
WBC: 11.7 10*3/uL — AB (ref 4.0–10.5)

## 2015-04-14 LAB — BLOOD GAS, ARTERIAL
Acid-Base Excess: 19.5 mmol/L — ABNORMAL HIGH (ref 0.0–2.0)
BICARBONATE: 44.3 meq/L — AB (ref 20.0–24.0)
Drawn by: 25203
FIO2: 0.4
O2 SAT: 94.1 %
PATIENT TEMPERATURE: 98.6
PCO2 ART: 52.8 mmHg — AB (ref 35.0–45.0)
PEEP: 10 cmH2O
PH ART: 7.533 — AB (ref 7.350–7.450)
PO2 ART: 64.7 mmHg — AB (ref 80.0–100.0)
RATE: 20 resp/min
TCO2: 45.9 mmol/L (ref 0–100)
VT: 620 mL

## 2015-04-14 LAB — BRAIN NATRIURETIC PEPTIDE: B Natriuretic Peptide: 265.6 pg/mL — ABNORMAL HIGH (ref 0.0–100.0)

## 2015-04-14 LAB — TROPONIN I
TROPONIN I: 0.03 ng/mL (ref <0.031)
Troponin I: 0.04 ng/mL — ABNORMAL HIGH (ref <0.031)

## 2015-04-14 LAB — PROCALCITONIN: Procalcitonin: 0.15 ng/mL

## 2015-04-14 LAB — GLUCOSE, CAPILLARY
GLUCOSE-CAPILLARY: 136 mg/dL — AB (ref 65–99)
GLUCOSE-CAPILLARY: 150 mg/dL — AB (ref 65–99)
Glucose-Capillary: 120 mg/dL — ABNORMAL HIGH (ref 65–99)
Glucose-Capillary: 131 mg/dL — ABNORMAL HIGH (ref 65–99)
Glucose-Capillary: 160 mg/dL — ABNORMAL HIGH (ref 65–99)
Glucose-Capillary: 158 mg/dL — ABNORMAL HIGH (ref 65–99)

## 2015-04-14 LAB — MRSA PCR SCREENING: MRSA BY PCR: NEGATIVE

## 2015-04-14 LAB — TRIGLYCERIDES: TRIGLYCERIDES: 123 mg/dL (ref <150)

## 2015-04-14 LAB — APTT
APTT: 60 s — AB (ref 24–37)
aPTT: 66 seconds — ABNORMAL HIGH (ref 24–37)

## 2015-04-14 LAB — LACTIC ACID, PLASMA: Lactic Acid, Venous: 1.4 mmol/L (ref 0.5–2.0)

## 2015-04-14 MED ORDER — POTASSIUM CHLORIDE CRYS ER 20 MEQ PO TBCR
40.0000 meq | EXTENDED_RELEASE_TABLET | Freq: Once | ORAL | Status: AC
  Administered 2015-04-14: 40 meq via ORAL
  Filled 2015-04-14 (×2): qty 2

## 2015-04-14 MED ORDER — POTASSIUM CHLORIDE 20 MEQ/15ML (10%) PO SOLN
40.0000 meq | Freq: Three times a day (TID) | ORAL | Status: AC
  Administered 2015-04-14 (×2): 40 meq
  Filled 2015-04-14 (×3): qty 30

## 2015-04-14 MED ORDER — AMOXICILLIN-POT CLAVULANATE 600-42.9 MG/5ML PO SUSR
875.0000 mg | Freq: Two times a day (BID) | ORAL | Status: DC
  Administered 2015-04-14 – 2015-04-15 (×3): 876 mg
  Filled 2015-04-14 (×3): qty 7.3

## 2015-04-14 MED ORDER — ACETAZOLAMIDE SODIUM 500 MG IJ SOLR
250.0000 mg | Freq: Four times a day (QID) | INTRAMUSCULAR | Status: AC
  Administered 2015-04-14 (×3): 250 mg via INTRAVENOUS
  Filled 2015-04-14 (×3): qty 250

## 2015-04-14 MED ORDER — FENTANYL CITRATE (PF) 100 MCG/2ML IJ SOLN
25.0000 ug | INTRAMUSCULAR | Status: DC | PRN
  Administered 2015-04-14 – 2015-04-15 (×4): 50 ug via INTRAVENOUS
  Filled 2015-04-14 (×4): qty 2

## 2015-04-14 MED ORDER — MIDAZOLAM HCL 2 MG/2ML IJ SOLN
1.0000 mg | INTRAMUSCULAR | Status: DC | PRN
  Administered 2015-04-15: 2 mg via INTRAVENOUS
  Filled 2015-04-14: qty 2

## 2015-04-14 MED ORDER — PANTOPRAZOLE SODIUM 40 MG PO PACK
40.0000 mg | PACK | Freq: Every day | ORAL | Status: DC
  Administered 2015-04-15: 40 mg
  Filled 2015-04-14 (×2): qty 20

## 2015-04-14 MED ORDER — VITAL HIGH PROTEIN PO LIQD
1000.0000 mL | ORAL | Status: DC
  Administered 2015-04-14: 1000 mL
  Administered 2015-04-14: 16:00:00

## 2015-04-14 MED ORDER — POTASSIUM CHLORIDE 20 MEQ/15ML (10%) PO SOLN
40.0000 meq | ORAL | Status: AC
  Administered 2015-04-14 (×2): 40 meq
  Filled 2015-04-14 (×2): qty 30

## 2015-04-14 MED ORDER — HEPARIN (PORCINE) IN NACL 100-0.45 UNIT/ML-% IJ SOLN
1800.0000 [IU]/h | INTRAMUSCULAR | Status: DC
  Administered 2015-04-14: 1800 [IU]/h via INTRAVENOUS
  Filled 2015-04-14 (×2): qty 250

## 2015-04-14 MED ORDER — PRO-STAT SUGAR FREE PO LIQD
30.0000 mL | Freq: Four times a day (QID) | ORAL | Status: DC
  Administered 2015-04-14 – 2015-04-15 (×4): 30 mL
  Filled 2015-04-14 (×6): qty 30

## 2015-04-14 MED ORDER — FUROSEMIDE 10 MG/ML IJ SOLN
10.0000 mg/h | INTRAVENOUS | Status: AC
  Administered 2015-04-14: 10 mg/h via INTRAVENOUS
  Filled 2015-04-14 (×2): qty 25

## 2015-04-14 MED ORDER — HEPARIN (PORCINE) IN NACL 100-0.45 UNIT/ML-% IJ SOLN
2400.0000 [IU]/h | INTRAMUSCULAR | Status: AC
  Administered 2015-04-14: 2050 [IU]/h via INTRAVENOUS
  Administered 2015-04-15: 2400 [IU]/h via INTRAVENOUS
  Filled 2015-04-14 (×2): qty 250

## 2015-04-14 MED ORDER — METOLAZONE 10 MG PO TABS
10.0000 mg | ORAL_TABLET | Freq: Once | ORAL | Status: AC
  Administered 2015-04-14: 10 mg via ORAL
  Filled 2015-04-14: qty 1
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
## 2015-04-14 NOTE — Progress Notes (Signed)
UR Completed. Akasha Melena, RN, BSN.  [phone] 
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
## 2015-04-14 NOTE — Progress Notes (Signed)
[facility] ADULT ICU REPLACEMENT PROTOCOL FOR AM LAB REPLACEMENT ONLY  The patient does apply for the [facility] Adult ICU Electrolyte Replacment Protocol based on the criteria listed below:   1. Is GFR >/= 40 ml/min? Yes.    Patient's GFR today is >60 2. Is urine output >/= 0.5 ml/kg/hr for the last 6 hours? Yes.   Patient's UOP is 0.66 ml/kg/hr 3. Is BUN < 60 mg/dL? Yes.    Patient's BUN today is 30 4. Abnormal electrolyte  K 2.8 5. Ordered repletion with: per protocol 6. If a panic level lab has been reported, has the CCM MD in charge been notified? Yes.  .   Physician:  Heloise Beecham 04/14/2015 3:49 AM
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
## 2015-04-14 NOTE — Progress Notes (Signed)
UR Completed. Rayner Erman, RN, BSN.  [phone] 

## 2015-04-14 NOTE — Progress Notes (Addendum)
Initial Nutrition Assessment  DOCUMENTATION CODES:   Morbid obesity  INTERVENTION:   Initiate TF via OGT with Vital High Protein at 25 ml/h and Prostat 30 ml QID on day 1; on day 2, increase to goal rate of 60 ml/h (1440 ml per day) to provide 1840 kcals, 186 gm protein, 1204 ml free water daily.  NUTRITION DIAGNOSIS:   Inadequate oral intake related to inability to eat as evidenced by NPO status.  GOAL:   Provide needs based on ASPEN/SCCM guidelines  MONITOR:   Vent status, Labs, Weight trends, I & O's  REASON FOR ASSESSMENT:   Consult Enteral/tube feeding initiation and management  ASSESSMENT:   45 year old male with PMH of OSA/OHS on home trilogy w/ 3L 02, HTN, HLD, R BKA, and PE. He was recently admitted to [facility] with OHS and CHF exacerbations requiring intubation and again for peritonsilar abscess managed medically. He has been seen by cardiology twice since discharge. He has been gaining weight despite reportedly strict adherence to diuretic regimen and diet. In the ED patient required intubation.  Nutrition-Focused physical exam completed. Difficult to determine muscle status with morbid obesity. No significant fat or muscle depletion noted. Spoke with family in room. Patient has been following a strict low sodium, fluid restricted diet. Wife reports that he was writing down sodium and fluid intake PTA at home.   Discussed patient in ICU rounds and with RN today. Plans to d/c Propofol and start enteral nutrition today, RD to order TF.   Patient is currently intubated on ventilator support Temp (24hrs), Avg:98.3 F (36.8 C), Min:97.7 F (36.5 C), Max:99.6 F (37.6 C)  Propofol: 17.9 ml/hr providing 473 kcals per day. Being stopped today.   Diet Order:  Diet NPO time specified  Skin:  Reviewed, no issues  Last BM:  unknown  Height:   Ht Readings from Last 1 Encounters:  04/13/15 6' (1.829 m)    Weight:   Wt Readings from Last 1 Encounters:  04/14/15 407  lb 13.6 oz (185 kg)    Ideal Body Weight:  75.6 kg (adjusted for right BKA)  BMI:  Body mass index is 55.3 kg/(m^2).  Estimated Nutritional Needs:   Kcal:  [id number]  Protein:  189 gm  Fluid:  2 L  EDUCATION NEEDS:   No education needs identified at this time  Molli Barrows, [place], [place], [place] Pager [phone] After Hours Pager [phone]

## 2015-04-15 ENCOUNTER — Inpatient Hospital Stay (HOSPITAL_COMMUNITY): Payer: 59

## 2015-04-15 LAB — BASIC METABOLIC PANEL
ANION GAP: 14 (ref 5–15)
Anion gap: 19 — ABNORMAL HIGH (ref 5–15)
BUN: 24 mg/dL — ABNORMAL HIGH (ref 6–20)
BUN: 19 mg/dL (ref 6–20)
CALCIUM: 9.5 mg/dL (ref 8.9–10.3)
CALCIUM: 9.6 mg/dL (ref 8.9–10.3)
CO2: 40 mmol/L — ABNORMAL HIGH (ref 22–32)
CO2: 46 mmol/L — ABNORMAL HIGH (ref 22–32)
CREATININE: 0.92 mg/dL (ref 0.61–1.24)
Chloride: 83 mmol/L — ABNORMAL LOW (ref 101–111)
Chloride: 80 mmol/L — ABNORMAL LOW (ref 101–111)
Creatinine, Ser: 1.01 mg/dL (ref 0.61–1.24)
GLUCOSE: 145 mg/dL — AB (ref 65–99)
Glucose, Bld: 128 mg/dL — ABNORMAL HIGH (ref 65–99)
Potassium: 2.5 mmol/L — CL (ref 3.5–5.1)
Potassium: 3.3 mmol/L — ABNORMAL LOW (ref 3.5–5.1)
SODIUM: 142 mmol/L (ref 135–145)
Sodium: 140 mmol/L (ref 135–145)

## 2015-04-15 LAB — CULTURE, RESPIRATORY: SPECIAL REQUESTS: NORMAL

## 2015-04-15 LAB — BLOOD GAS, ARTERIAL
ACID-BASE EXCESS: 23.9 mmol/L — AB (ref 0.0–2.0)
Bicarbonate: 49.3 mEq/L — ABNORMAL HIGH (ref 20.0–24.0)
DRAWN BY: 25203
FIO2: 0.4
LHR: 20 {breaths}/min
MECHVT: 570 mL
O2 Saturation: 91.1 %
PEEP/CPAP: 8 cmH2O
Patient temperature: 98.6
TCO2: 51.1 mmol/L (ref 0–100)
pCO2 arterial: 58.3 mmHg (ref 35.0–45.0)
pH, Arterial: 7.536 — ABNORMAL HIGH (ref 7.350–7.450)
pO2, Arterial: 59.1 mmHg — ABNORMAL LOW (ref 80.0–100.0)

## 2015-04-15 LAB — GLUCOSE, CAPILLARY
GLUCOSE-CAPILLARY: 124 mg/dL — AB (ref 65–99)
GLUCOSE-CAPILLARY: 138 mg/dL — AB (ref 65–99)
GLUCOSE-CAPILLARY: 136 mg/dL — AB (ref 65–99)
Glucose-Capillary: 133 mg/dL — ABNORMAL HIGH (ref 65–99)
Glucose-Capillary: 152 mg/dL — ABNORMAL HIGH (ref 65–99)
Glucose-Capillary: 192 mg/dL — ABNORMAL HIGH (ref 65–99)

## 2015-04-15 LAB — CBC
HCT: 38.1 % — ABNORMAL LOW (ref 39.0–52.0)
HEMOGLOBIN: 10.6 g/dL — AB (ref 13.0–17.0)
MCH: 20.2 pg — ABNORMAL LOW (ref 26.0–34.0)
MCHC: 27.8 g/dL — AB (ref 30.0–36.0)
MCV: 72.4 fL — ABNORMAL LOW (ref 78.0–100.0)
PLATELETS: 278 10*3/uL (ref 150–400)
RBC: 5.26 MIL/uL (ref 4.22–5.81)
RDW: 21.1 % — ABNORMAL HIGH (ref 11.5–15.5)
WBC: 13 10*3/uL — ABNORMAL HIGH (ref 4.0–10.5)

## 2015-04-15 LAB — HEPARIN LEVEL (UNFRACTIONATED): HEPARIN UNFRACTIONATED: 1.05 [IU]/mL — AB (ref 0.30–0.70)

## 2015-04-15 LAB — URINE CULTURE: CULTURE: NO GROWTH

## 2015-04-15 LAB — APTT
APTT: 60 s — AB (ref 24–37)
APTT: 77 s — AB (ref 24–37)

## 2015-04-15 LAB — POTASSIUM: Potassium: 2.5 mmol/L — CL (ref 3.5–5.1)

## 2015-04-15 LAB — CULTURE, RESPIRATORY W GRAM STAIN: Culture: NORMAL

## 2015-04-15 LAB — PHOSPHORUS: PHOSPHORUS: 3.9 mg/dL (ref 2.5–4.6)

## 2015-04-15 LAB — PROCALCITONIN: Procalcitonin: 0.19 ng/mL

## 2015-04-15 LAB — MAGNESIUM: MAGNESIUM: 2 mg/dL (ref 1.7–2.4)

## 2015-04-15 MED ORDER — RIVAROXABAN 20 MG PO TABS
20.0000 mg | ORAL_TABLET | Freq: Every day | ORAL | Status: DC
  Administered 2015-04-15 – 2015-04-17 (×3): 20 mg via ORAL
  Filled 2015-04-15 (×3): qty 1

## 2015-04-15 MED ORDER — FUROSEMIDE 10 MG/ML IJ SOLN
40.0000 mg | Freq: Three times a day (TID) | INTRAMUSCULAR | Status: AC
  Administered 2015-04-15 (×2): 40 mg via INTRAVENOUS
  Filled 2015-04-15 (×2): qty 4

## 2015-04-15 MED ORDER — POTASSIUM CHLORIDE 10 MEQ/100ML IV SOLN
10.0000 meq | INTRAVENOUS | Status: AC
  Administered 2015-04-15 (×4): 10 meq via INTRAVENOUS
  Filled 2015-04-15 (×4): qty 100

## 2015-04-15 MED ORDER — POTASSIUM CHLORIDE CRYS ER 20 MEQ PO TBCR
40.0000 meq | EXTENDED_RELEASE_TABLET | Freq: Once | ORAL | Status: AC
  Administered 2015-04-15: 40 meq via ORAL
  Filled 2015-04-15: qty 2

## 2015-04-15 MED ORDER — AMOXICILLIN-POT CLAVULANATE 875-125 MG PO TABS
1.0000 | ORAL_TABLET | Freq: Two times a day (BID) | ORAL | Status: DC
  Administered 2015-04-15 – 2015-04-18 (×6): 1 via ORAL
  Filled 2015-04-15 (×11): qty 1

## 2015-04-15 MED ORDER — POTASSIUM CHLORIDE CRYS ER 20 MEQ PO TBCR
40.0000 meq | EXTENDED_RELEASE_TABLET | ORAL | Status: AC
  Administered 2015-04-15 – 2015-04-16 (×2): 40 meq via ORAL
  Filled 2015-04-15 (×2): qty 2

## 2015-04-15 MED ORDER — FUROSEMIDE 10 MG/ML IJ SOLN: 40.0000 mg | Freq: Three times a day (TID) | INTRAMUSCULAR | Status: DC

## 2015-04-15 MED ORDER — PANTOPRAZOLE SODIUM 40 MG PO TBEC
40.0000 mg | DELAYED_RELEASE_TABLET | Freq: Every day | ORAL | Status: DC
  Administered 2015-04-16: 40 mg via ORAL
  Filled 2015-04-15: qty 1

## 2015-04-15 MED ORDER — POTASSIUM CHLORIDE 20 MEQ/15ML (10%) PO SOLN
40.0000 meq | ORAL | Status: DC
  Administered 2015-04-15 (×2): 40 meq
  Filled 2015-04-15 (×3): qty 30

## 2015-04-15 NOTE — Progress Notes (Signed)
[facility] ADULT ICU REPLACEMENT PROTOCOL FOR AM LAB REPLACEMENT ONLY  The patient does apply for the [facility] Adult ICU Electrolyte Replacment Protocol based on the criteria listed below:   1. Is GFR >/= 40 ml/min? Yes.    Patient's GFR today is >60 2. Is urine output >/= 0.5 ml/kg/hr for the last 6 hours? Yes.   Patient's UOP is 1.6 ml/kg/hr 3. Is BUN < 60 mg/dL? Yes.    Patient's BUN today is 24 4. Abnormal electrolyte K 2.5 5. Ordered repletion with: per protocol 6. If a panic level lab has been reported, has the CCM MD in charge been notified? Yes.  .   Physician:  [place]  Christeen Douglas 04/15/2015 5:11 AM

## 2015-04-15 NOTE — Progress Notes (Signed)
ANTICOAGULATION CONSULT NOTE - Follow Up Consult  Pharmacy Consult for heparin Indication: h/o PE  Labs:  Recent Labs  04/13/15 1606 04/13/15 2238  04/13/15 2251 04/14/15 0228 04/14/15 0923 04/14/15 1448 04/14/15 1704 04/14/15 1930 04/14/15 2140 04/15/15 0350  HGB 10.8*  --   --   --  10.0*  --   --   --   --   --   --   HCT 39.7  --   --   --  36.5*  --   --   --   --   --   --   PLT 301  --   --   --  257  --   --   --   --   --   --   APTT  --   --   < >  --   --   --  60*  --   --  66* 60*  HEPARINUNFRC  --  >2.20*  --   --   --   --   --   --   --   --  1.05*  CREATININE 1.58*  --   --   --  1.21  --   --  1.07 1.05  --   --   TROPONINI  --   --   --  0.03 0.04* 0.03  --   --   --   --   --   < > = values in this interval not displayed.   Assessment: 45yo male now subtherapeutic on heparin despite rate increase.  Goal of Therapy:  aPTT 66-102 seconds   Plan:  Will increase heparin gtt by 1-2 units/kg/hr to 2400 units/hr and check PTT in 6hr.  Wynona Neat, PharmD, BCPS  04/15/2015,4:20 AM

## 2015-04-15 NOTE — Progress Notes (Signed)
Nelda Marseille MD requested SpO2 high limit 88% and to monitor for lethergy. States patient lives in the 80% range at home. Joaquin Bend E, RN 04/15/2015 11:19 AM

## 2015-04-15 NOTE — Progress Notes (Signed)
CRITICAL VALUE ALERT  Critical value received: K+  Date of notification: 04/15/15   Time of notification:  0503  Critical value read back:  yes  Nurse who received alert:  Jannifer Franklin, RN  MD notified (1st page):  Dr. Jimmy Footman  Time of first page:   MD notified (2nd page):  Time of second page:  Responding MD: Dr. Jimmy Footman  Time MD responded:  [phone]

## 2015-04-15 NOTE — Care Management Note (Addendum)
Case Management Note  Patient Details  Name: Dean Bowers MRN: [id number] Date of Birth: 02/09/1970  Subjective/Objective:      Pt admitted with acute on chronic CHF              Action/Plan:    [facility] unable to provide scale.  CM printed out scales at [facility] for approximately $64.00 and bariatric scales with arms for approximately $400 and provided to pt, informed pt that HF RN will follow up with him on Monday.  CM will continue to monitior  Pt is from home with wife.  Pt has had multiple admits for CHF - CM requested attending for [facility] RN for disease management - pt and wife are open to [facility].  Provided sticky note requesting HHRN.  Pt states he needs bariatric scale at home that walker can fit around, pt is has a leg amputation and currently has scale that can not accurately determine weight .  CM contacted HF nurse and requested consult, CM contacted [facility] to inquire about bariatric scale.  Obesity Hypoventilation Syndrome on Trilogy w/ O2 at home provided by [employer].  CM will continue to monitor for disposition needs   Expected Discharge Date:                  Expected Discharge Plan:  [facility]  In-House Referral:     Discharge planning Services  CM Consult  Post Acute Care Choice:    Choice offered to:     DME Arranged:    DME Agency:     HH Arranged:    HH Agency:     Status of Service:  In process, will continue to follow  Medicare Important Message Given:    Date Medicare IM Given:    Medicare IM give by:    Date Additional Medicare IM Given:    Additional Medicare Important Message give by:     If discussed at [place] of Stay Meetings, dates discussed:    Additional Comments:  Maryclare Labrador, RN 04/15/2015, 11:39 AM

## 2015-04-15 NOTE — Progress Notes (Signed)
eLink Physician-Brief Progress Note Patient Name: WA RUMPF DOB: 29-Jan-1971 MRN: [id number]   Date of Service  04/15/2015  HPI/Events of Note  Hypokalemia Hypoxia  eICU Interventions  Potassium replaced Order for PRN and qHS BiPAP     Intervention Category Intermediate Interventions: Electrolyte abnormality - evaluation and management;Respiratory distress - evaluation and management  Brandin Dilday 04/15/2015, 9:43 PM
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
## 2015-04-15 NOTE — Progress Notes (Signed)
CRITICAL VALUE ALERT  Critical value received:  ABG  pH 7.536, pCO2 58.3, pO2 59.1, HCO3 49.3  Date of notification:  04/15/2015  Time of notification:  0515  Critical value read back: yes  Nurse who received alert:  Jannifer Franklin, RN  MD notified (1st page):    Time of first page:    MD notified (2nd page):  Time of second page:  Responding MD:  Dr. Jimmy Footman  Time MD responded:  [phone]

## 2015-04-15 NOTE — Procedures (Signed)
Extubation Procedure Note  Patient Details:   Name: COSTON SHIVE DOB: 01-Dec-1970 MRN: [id number]   Airway Documentation:  Airway 7.5 mm (Active)  Secured at (cm) 23 cm 04/15/2015  8:00 AM  Measured From Lips 04/15/2015  8:00 AM  [place] 04/15/2015  7:44 AM  Secured By [employer] 04/15/2015  8:00 AM  Tube Holder Repositioned Yes 04/15/2015  7:44 AM  Cuff Pressure (cm H2O) 24 cm H2O 04/15/2015  7:44 AM  Site Condition Dry 04/15/2015  8:00 AM   Pt extubated to 4lpm [place], sats 88-90%, no distress noted.  Evaluation  O2 sats: stable throughout Complications: No apparent complications Patient did tolerate procedure well. Bilateral Breath Sounds: Rhonchi, Diminished Suctioning: Airway Yes  Geana Walts Wyatt Haste 04/15/2015, 10:54 AM

## 2015-04-15 NOTE — Progress Notes (Signed)
eLink Physician-Brief Progress Note Patient Name: Dean Bowers DOB: 06/05/1970 MRN: [id number]   Date of Service  04/15/2015  HPI/Events of Note  Camera check outpatient postextubation. Extubated at approximately 1050 this morning. Patient reports no difficulty breathing at this time and has minimal work of breathing. Saturation ranging 85-89 percent currently but appears comfortable. Good waveform on bedside monitor. Patient up to chair bedside and otherwise appears stable.  eICU Interventions  Continue close monitoring in the intensive care unit for possible need of reintubation given ongoing hypoxic respiratory failure.     Intervention Category Major Interventions: Respiratory failure - evaluation and management  Tera Partridge 04/15/2015, 5:09 PM

## 2015-04-15 NOTE — Progress Notes (Signed)
PULMONARY / CRITICAL CARE MEDICINE   Name: Dean Bowers MRN: [id number] DOB: 1970/11/18    ADMISSION DATE:  04/13/2015 CONSULTATION DATE:  3/8  REFERRING MD:  ED  CHIEF COMPLAINT:  Weight gain  HISTORY OF PRESENT ILLNESS:  45 year old male with PMH as below which includes OSA/OHS on home trilogy w/ 3L 02, HTN, HLD, R BKA, and PE. He was recently admitted to [facility] with OHS and CHF exacerbations requiring intubation and again for peritonsilar abscess managed medically. He was discharged on amoxicillin for 2 weeks which he has finished. He has been seen by cardiology twice since discharge. He has been gaining weight despite reportedly strict adherence to diuretic regimen and diet. His torsemide dose has been significantly increased and he was recently started on metolazone.  He has continued to have significant weight gain. Presented to the cardiology office 3/8 for a weight check where it was noted that he was short of breath with SpO2 80%. He was referred to [facility] department where sats were in the 33s. He was started on supplemental O2 and became increasingly somnolent. ABG showed respiratory acidosis with and he was started on BiPAP. He somnolence continued. PCCM called for further evaluation.   SUBJECTIVE: Alert and oriented this AM, moving all ext to command.  VITAL SIGNS: BP 111/54 mmHg  Pulse 72  Temp(Src) 98.7 F (37.1 C) (Oral)  Resp 22  Ht 6' (1.829 m)  Wt 173 kg (381 lb 6.3 oz)  BMI 51.72 kg/m2  SpO2 96%  HEMODYNAMICS:    VENTILATOR SETTINGS: Vent Mode:  [-] PRVC FiO2 (%):  [40 %-50 %] 50 % Set Rate:  [20 bmp] 20 bmp Vt Set:  [570 mL] 570 mL PEEP:  [5 cmH20-8 cmH20] 5 cmH20 Pressure Support:  [5 cmH20] 5 cmH20 Plateau Pressure:  [26 cmH20-34 cmH20] 26 cmH20  INTAKE / OUTPUT: I/O last 3 completed shifts: In: 2664.7 [I.V.:1416; NG/GT:1248.8] Out: 16300 [Urine:15800; Other:500]  PHYSICAL EXAMINATION: General:  Morbidly obese male on vent Neuro:  Arousable,  following commands. HEENT:  [place]/AT, PERRL, no appreciable JVD Cardiovascular:  RRR, no MRG Lungs:  Clear bilateral breath sounds Abdomen:  Soft, non-tender, non-distended, obese Musculoskeletal:  R BKA amputation  Skin:  Tattoos, grossly intact  LABS:  BMET  Recent Labs Lab 04/14/15 1704 04/14/15 1930 04/15/15 0047 04/15/15 0350  NA 142 141  --  142  K 3.7 3.3* 2.5* 2.5*  CL 87* 87*  --  83*  CO2 43* 38*  --  40*  BUN 25* 26*  --  24*  CREATININE 1.07 1.05  --  0.92  GLUCOSE 156* 130*  --  128*   Electrolytes  Recent Labs Lab 04/13/15 2250  04/14/15 1704 04/14/15 1930 04/15/15 0350  CALCIUM  --   < > 9.1 9.0 9.5  MG 2.1  --   --   --  2.0  PHOS 4.8*  --   --   --  3.9  < > = values in this interval not displayed.  CBC  Recent Labs Lab 04/13/15 1606 04/14/15 0228 04/15/15 0730  WBC 15.9* 11.7* 13.0*  HGB 10.8* 10.0* 10.6*  HCT 39.7 36.5* 38.1*  PLT 301 257 PENDING   Coag's  Recent Labs Lab 04/14/15 1448 04/14/15 2140 04/15/15 0350  APTT 60* 66* 60*   Sepsis Markers  Recent Labs Lab 04/13/15 2238 04/14/15 0129 04/14/15 0228  LATICACIDVEN 1.9 1.4  --   PROCALCITON 0.17  --  0.15   ABG  Recent  Labs Lab 04/13/15 2149 04/14/15 0433 04/15/15 0450  PHART 7.393 7.533* 7.536*  PCO2ART 79.9* 52.8* 58.3*  PO2ART 209.0* 64.7* 59.1*   Liver Enzymes No results for input(s): AST, ALT, ALKPHOS, BILITOT, ALBUMIN in the last 168 hours.  Cardiac Enzymes  Recent Labs Lab 04/13/15 2251 04/14/15 0228 04/14/15 0923  TROPONINI 0.03 0.04* 0.03   Glucose  Recent Labs Lab 04/14/15 1116 04/14/15 1523 04/14/15 2024 04/14/15 2335 04/15/15 0345 04/15/15 0711  GLUCAP 150* 160* 158* 133* 124* 138*   Imaging Dg Chest Port 1 View  04/15/2015  CLINICAL DATA:  Endotracheal tube EXAM: PORTABLE CHEST 1 VIEW COMPARISON:  04/13/2015 FINDINGS: Endotracheal tube tip is 5.3 cm above the carina. NG tube extends off the inferior edge of the film. Moderate to  severe cardiac enlargement. Severe venous congestion and moderate interstitial prominence. IMPRESSION: Congestive heart failure with pulmonary edema. Endotracheal tube tip 5.3 cm above the carina. Electronically Signed   By: Skipper Cliche M.D.   On: 04/15/2015 07:26   STUDIES:  PFT 06/16/13 >> FEV1 2.13 (53%), FEV1% 81, TLC 4.92 (72%), DLCO 95%, no BD PSG 04/23/14 >> AHI 139.7, SpO2 low 70% CT chest 04/28/14 >> small PE LLL subsegment, Rt pleural effusion, 3 mm nodule RML, 4 mm nodule LLL, Sub carinal LAN Trilogy AVAPS settings >> Vt 630, PS 6 to 30 cm H2O, max IPAP 35 cm H2O, EPAP 4 to 20 cm H2O. Used with 2 liters oxygen. CT chest 11/18/14 >> no change  CULTURES: BCx2 3/8 > Urine 3/8 > Tracheal aspirate 3/8 >  ANTIBIOTICS: None  SIGNIFICANT EVENTS: 2/5 discharge with peritonsillar abscess on amoxicillin 3/8 admit for OHS, CHF  LINES/TUBES: ETT 3/8>>>  DISCUSSION: 45 year old male with PMH of OSA/OHS on Trilogy and CHF. He presented with weight gain for the past month refractory to diuresis. He was satting 60% in ED and was placed on O2. He became increasingly somnolent requiring BiPAP and eventually intubation. Likely decompensated OHS with acute exacerbation CHF.  ASSESSMENT / PLAN:  PULMONARY A: Acute on chronic hypoxemic/hypercarbic respiratory failure Decompensated OHS/OSA on Trilogy with 3L O2 Pulmonary edema P:   SBT to extubate. CXR and ABG in AM. Vent bundle. Do not allow to sat above 88%. IS per RT protocol.  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 Diureses as below. Trend troponin. Holding home anti-hypertensives.  RENAL A:   AKI Hypokalemia  P:   IVF KVO. D/C lasix drip after it expires. Lasix 40 mg IV q8 x2 doses. Follow BMP. Correct electrolytes as indicated. Supp K.  GASTROINTESTINAL A:   No acute issues  P:   D/C TF. Diet post extubation. PPI.  HEMATOLOGIC A:    Hx PE on xarelto  P:  Xarelto via G-tube. D/C heparin. Follow CBC, coags.  INFECTIOUS A:   On Augmentin for foot cellulitis.  P:   Pan culture. Continue augmentin until 3/15.  ENDOCRINE A:   Hyperglycemia without history of DM   P:   CBG monitoring and SSI q 4 hours  NEUROLOGIC A:   Acute metabolic encephalopathy in setting hypercarbia, hypoxemia P:   D/C sedation.  FAMILY  - Updates: Wife and mother updated at bedside.  Patient wants to be extubated.  Will place on SBT.  - Inter-disciplinary family meet or Palliative Care meeting due by:  3/15  The patient is critically ill with multiple organ systems failure and requires high complexity decision making for assessment and support,  frequent evaluation and titration of therapies, application of advanced monitoring technologies and extensive interpretation of multiple databases.   Critical Care Time devoted to patient care services described in this note is  35  Minutes. This time reflects time of care of this signee Dr Jennet Maduro. This critical care time does not reflect procedure time, or teaching time or supervisory time of PA/NP/Med student/Med Resident etc but could involve care discussion time.  Rush Farmer, M.D. [facility] Pulmonary/Critical Care Medicine. Pager: [phone]. After hours pager: [phone].  04/15/2015 10:27 AM

## 2015-04-16 DIAGNOSIS — J810 Acute pulmonary edema: Secondary | ICD-10-CM

## 2015-04-16 LAB — BASIC METABOLIC PANEL
ANION GAP: 11 (ref 5–15)
Anion gap: 12 (ref 5–15)
BUN: 16 mg/dL (ref 6–20)
BUN: 11 mg/dL (ref 6–20)
CALCIUM: 9.4 mg/dL (ref 8.9–10.3)
CO2: 44 mmol/L — ABNORMAL HIGH (ref 22–32)
CO2: 33 mmol/L — ABNORMAL HIGH (ref 22–32)
CREATININE: 0.83 mg/dL (ref 0.61–1.24)
Calcium: 9.3 mg/dL (ref 8.9–10.3)
Chloride: 80 mmol/L — ABNORMAL LOW (ref 101–111)
Chloride: 90 mmol/L — ABNORMAL LOW (ref 101–111)
Creatinine, Ser: 0.76 mg/dL (ref 0.61–1.24)
GFR calc Af Amer: >60 mL/min (ref >60)
GLUCOSE: 129 mg/dL — AB (ref 65–99)
Glucose, Bld: 125 mg/dL — ABNORMAL HIGH (ref 65–99)
POTASSIUM: 3 mmol/L — AB (ref 3.5–5.1)
Potassium: 2.4 mmol/L — CL (ref 3.5–5.1)
SODIUM: 136 mmol/L (ref 135–145)
SODIUM: 134 mmol/L — AB (ref 135–145)

## 2015-04-16 LAB — PHOSPHORUS: PHOSPHORUS: 4.2 mg/dL (ref 2.5–4.6)

## 2015-04-16 LAB — GLUCOSE, CAPILLARY
GLUCOSE-CAPILLARY: 110 mg/dL — AB (ref 65–99)
GLUCOSE-CAPILLARY: 211 mg/dL — AB (ref 65–99)
GLUCOSE-CAPILLARY: 136 mg/dL — AB (ref 65–99)
Glucose-Capillary: 108 mg/dL — ABNORMAL HIGH (ref 65–99)
Glucose-Capillary: 120 mg/dL — ABNORMAL HIGH (ref 65–99)
Glucose-Capillary: 123 mg/dL — ABNORMAL HIGH (ref 65–99)

## 2015-04-16 LAB — CBC
HCT: 39.5 % (ref 39.0–52.0)
HEMOGLOBIN: 10.5 g/dL — AB (ref 13.0–17.0)
MCH: 19.4 pg — AB (ref 26.0–34.0)
MCHC: 26.6 g/dL — ABNORMAL LOW (ref 30.0–36.0)
MCV: 73.1 fL — ABNORMAL LOW (ref 78.0–100.0)
Platelets: 263 10*3/uL (ref 150–400)
RBC: 5.4 MIL/uL (ref 4.22–5.81)
RDW: 21.1 % — ABNORMAL HIGH (ref 11.5–15.5)
WBC: 13.7 10*3/uL — ABNORMAL HIGH (ref 4.0–10.5)

## 2015-04-16 LAB — MAGNESIUM: MAGNESIUM: 2 mg/dL (ref 1.7–2.4)

## 2015-04-16 MED ORDER — IPRATROPIUM-ALBUTEROL 0.5-2.5 (3) MG/3ML IN SOLN
3.0000 mL | Freq: Three times a day (TID) | RESPIRATORY_TRACT | Status: DC
  Administered 2015-04-17 – 2015-04-18 (×4): 3 mL via RESPIRATORY_TRACT
  Filled 2015-04-16 (×4): qty 3

## 2015-04-16 MED ORDER — POTASSIUM CHLORIDE CRYS ER 20 MEQ PO TBCR
40.0000 meq | EXTENDED_RELEASE_TABLET | ORAL | Status: AC
  Administered 2015-04-16 (×3): 40 meq via ORAL
  Filled 2015-04-16 (×4): qty 2

## 2015-04-16 MED ORDER — ACETAZOLAMIDE SODIUM 500 MG IJ SOLR
250.0000 mg | Freq: Four times a day (QID) | INTRAMUSCULAR | Status: AC
  Administered 2015-04-16 (×3): 250 mg via INTRAVENOUS
  Filled 2015-04-16 (×4): qty 250

## 2015-04-16 NOTE — Progress Notes (Signed)
Patient admitted to [facility]. Patient alert and oriented x 4; resting comfortably. Vital signs normal. Patient with no complaints. CCMD notified of transfer from 22M. Patient has prosthetic leg, cell phone, charger, and clothing. Wife at bedside. Oriented to unit and room. Instructed to call for any further needs. Milford Cage, RN

## 2015-04-16 NOTE — Progress Notes (Signed)
[facility] ADULT ICU REPLACEMENT PROTOCOL FOR AM LAB REPLACEMENT ONLY  The patient does apply for the [facility] Adult ICU Electrolyte Replacment Protocol based on the criteria listed below:   1. Is GFR >/= 40 ml/min? Yes.    Patient's GFR today is >60 2. Is urine output >/= 0.5 ml/kg/hr for the last 6 hours? Yes.   Patient's UOP is 0.6 ml/kg/hr 3. Is BUN < 60 mg/dL? Yes.    Patient's BUN today is 16 4. Abnormal electrolyte(s): K+2.4 5. Ordered repletion with: protocol 6. If a panic level lab has been reported, has the CCM MD in charge been notified? Yes.  .   Physician:  E Deterding  [facility] 04/16/2015 4:02 AM

## 2015-04-16 NOTE — Progress Notes (Signed)
CRITICAL VALUE ALERT  Critical value received:  K+ 2.4  Date of notification:  04/16/15  Time of notification:  0352  Critical value read back:  yes  Nurse who received alert:  Jannifer Franklin, RN  MD notified (1st page):  Dr. Jimmy Footman  Time of first page:    MD notified (2nd page):  Time of second page:  Responding MD:  Dr. Jimmy Footman  Time MD responded:  [phone]

## 2015-04-16 NOTE — Progress Notes (Signed)
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 He was recently admitted to [facility] with OHS and CHF exacerbations requiring intubation and again for peritonsilar abscess managed medically. He was discharged on amoxicillin for 2 weeks which he has finished. He has been seen by cardiology twice since discharge. He has been gaining weight despite reportedly strict adherence to diuretic regimen and diet. His torsemide dose has been significantly increased and he was recently started on metolazone.  He has continued to have significant weight gain. Presented to the cardiology office 3/8 for a weight check where it was noted that he was short of breath with SpO2 80%. He was referred to [facility] department where sats were in the 30s. He was started on supplemental O2 and became increasingly somnolent. ABG showed respiratory acidosis with and he was started on BiPAP. He somnolence continued. PCCM called for further evaluation.   SUBJECTIVE: No events overnight.  Extubated and doing well.  VITAL SIGNS: BP 118/66 mmHg  Pulse 80  Temp(Src) 98.3 F (36.8 C) (Axillary)  Resp 15  Ht 6' (1.829 m)  Wt 170 kg (374 lb 12.5 oz)  BMI 50.82 kg/m2  SpO2 90%  HEMODYNAMICS:    VENTILATOR SETTINGS: Vent Mode:  [-] BIPAP FiO2 (%):  [24 %] 24 %  INTAKE / OUTPUT: I/O last 3 completed shifts: In: 3938.2 [P.O.:1567; I.V.:1071.2; NG/GT:1000; IV Piggyback:300] Out: 14200 [id number]  PHYSICAL EXAMINATION: General:  Morbidly obese male sitting comfortably Neuro:  Alert and interactive, moving all ext to command. HEENT:  [place]/AT, PERRL, no appreciable JVD Cardiovascular:  RRR, no MRG, Nl S1/S2. Lungs:  Clear bilateral breath  sounds Abdomen:  Soft, non-tender, non-distended, obese Musculoskeletal:  R BKA amputation  Skin:  Tattoos, grossly intact  LABS:  BMET  Recent Labs Lab 04/15/15 0350 04/15/15 1955 04/16/15 0240  NA 142 140 136  K 2.5* 3.3* 2.4*  CL 83* 80* 80*  CO2 40* 46* 44*  BUN 24* 19 16  CREATININE 0.92 1.01 0.83  GLUCOSE 128* 145* 129*   Electrolytes  Recent Labs Lab 04/13/15 2250  04/15/15 0350 04/15/15 1955 04/16/15 0240  CALCIUM  --   < > 9.5 9.6 9.4  MG 2.1  --  2.0  --  2.0  PHOS 4.8*  --  3.9  --  4.2  < > = values in this interval not displayed.  CBC  Recent Labs Lab 04/14/15 0228 04/15/15 0730 04/16/15 0240  WBC 11.7* 13.0* 13.7*  HGB 10.0* 10.6* 10.5*  HCT 36.5* 38.1* 39.5  PLT 257 278 263   Coag's  Recent Labs Lab 04/14/15 2140 04/15/15 0350 04/15/15 1007  APTT 66* 60* 77*   Sepsis Markers  Recent Labs Lab 04/13/15 2238 04/14/15 0129 04/14/15 0228 04/15/15 0350  LATICACIDVEN 1.9 1.4  --   --   PROCALCITON 0.17  --  0.15 0.19   ABG  Recent Labs Lab 04/13/15 2149 04/14/15 0433 04/15/15 0450  PHART 7.393 7.533* 7.536*  PCO2ART 79.9* 52.8* 58.3*  PO2ART 209.0* 64.7* 59.1*   Liver Enzymes No results for input(s): AST, ALT, ALKPHOS, BILITOT, ALBUMIN in the last 168 hours.  Cardiac Enzymes  Recent Labs Lab 04/13/15  2251 04/14/15 0228 04/14/15 0923  TROPONINI 0.03 0.04* 0.03   Glucose  Recent Labs Lab 04/15/15 0711 04/15/15 1124 04/15/15 1545 04/15/15 2006 04/15/15 2332 04/16/15 0329  GLUCAP 138* 152* 192* 136* 120* 110*   Imaging No results found. STUDIES:  PFT 06/16/13 >> FEV1 2.13 (53%), FEV1% 81, TLC 4.92 (72%), DLCO 95%, no BD PSG 04/23/14 >> AHI 139.7, SpO2 low 70% CT chest 04/28/14 >> small PE LLL subsegment, Rt pleural effusion, 3 mm nodule RML, 4 mm nodule LLL, Sub carinal LAN Trilogy AVAPS settings >> Vt 630, PS 6 to 30 cm H2O, max IPAP 35 cm H2O, EPAP 4 to 20 cm H2O. Used with 2 liters oxygen. CT chest  11/18/14 >> no change  CULTURES: BCx2 3/8 >NTD Urine 3/8 >NTD Tracheal aspirate 3/8 >FEW GRAM POSITIVE COCCI   ANTIBIOTICS: Augmentin 3/9>>>3/16  SIGNIFICANT EVENTS: 2/5 discharge with peritonsillar abscess on amoxicillin 3/8 admit for OHS, CHF  LINES/TUBES: ETT 3/8>>>3/10  I reviewed CXR myself, mild pulmonary edema.  DISCUSSION: 45 year old male with PMH of OSA/OHS on Trilogy and CHF. He presented with weight gain for the past month refractory to diuresis. He was satting 60% in ED and was placed on O2. He became increasingly somnolent requiring BiPAP and eventually intubation. Likely decompensated OHS with acute exacerbation CHF.  ASSESSMENT / PLAN:  PULMONARY A: Acute on chronic hypoxemic/hypercarbic respiratory failure Decompensated OHS/OSA on Trilogy with 3L O2 Pulmonary edema P:   Titrate O2 for sat of 88-92%. Vent bundle. Do not allow to sat above 88%. IS per RT protocol.  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 Hold diureses. Trend troponin noted. Holding home anti-hypertensives, BP is stable here.  RENAL A:   AKI Hypokalemia  P:   IVF KVO. Hold lasix for today. Acetazolamide to address contraction alkalosis. Follow BMP. Correct electrolytes as indicated. Supp K.  GASTROINTESTINAL A:   No acute issues  P:   D/C TF. Heart healthy diabetic diet. PPI.  HEMATOLOGIC A:   Hx PE on xarelto  P:  Xarelto PO. D/C heparin. Follow CBC, coags.  INFECTIOUS A:   On Augmentin for foot cellulitis.  P:   Pan culture. Continue augmentin until 3/16.  ENDOCRINE A:   Hyperglycemia without history of DM   P:   CBG monitoring and SSI q 4 hours  NEUROLOGIC A:   Acute metabolic encephalopathy in setting hypercarbia, hypoxemia P:   Monitor.  Transfer to tele and to [facility] service with PCCM off 3/12, discussed with TRH-MD.  FAMILY  - Updates: Patient updated at bedside.  -  Inter-disciplinary family meet or Palliative Care meeting due by:  3/15  Rush Farmer, M.D. [facility] Pulmonary/Critical Care Medicine. Pager: [phone]. After hours pager: [phone].  04/16/2015 9:05 AM

## 2015-04-16 NOTE — Evaluation (Addendum)
Physical Therapy Evaluation Patient Details Name: Dean Bowers MRN: [id number] DOB: Jun 25, 1970 Today's Date: 04/16/2015   History of Present Illness  45 year old male with PMH as below which includes OSA/OHS on home trilogy w/ 3L 02, HTN, HLD, R BKA, and PE. He was recently admitted to [facility] with OHS and CHF exacerbations requiring intubation and again for peritonsilar abscess managed medically. He was discharged on amoxicillin for 2 weeks which he has finished. He has been seen by cardiology twice since discharge. He has been gaining weight despite reportedly strict adherence to diuretic regimen and diet. Admitted with acute hypoxic respiratory failyure requring bipap.  Clinical Impression  Patient presents with decreased mobility due to deficits noted in PT problem list below.  He will benefit from skilled PT in the acute setting to allow return home with family support and likely no current follow up PT needs as has HEP already from [place].  Of note desats to 77% with ambulation on 1L O2 this session.    Follow Up Recommendations No PT follow up    Equipment Recommendations  None recommended by PT    Recommendations for Other Services       Precautions / Restrictions Precautions Precautions: Fall Precaution Comments: watch O2 sats      Mobility  Bed Mobility               General bed mobility comments: NT up in chair  Transfers Overall transfer level: Needs assistance Equipment used: None Transfers: Sit to/from Stand Sit to Stand: Supervision         General transfer comment: stood from low recliner with supervision for lines, etc  Ambulation/Gait Ambulation/Gait assistance: Supervision Ambulation Distance (Feet): 120 Feet Assistive device: None Gait Pattern/deviations: Step-through pattern;Wide base of support;Drifts right/left;Decreased stride length     General Gait Details: increased BOS and increased lateral excursion  Stairs             Wheelchair Mobility    Modified Rankin (Stroke Patients Only)       Balance Overall balance assessment: Needs assistance   Sitting balance-Leahy Scale: Good       Standing balance-Leahy Scale: Fair                               Pertinent Vitals/Pain Pain Assessment: No/denies pain  SpO2 down to 77% ambulating on 1L O2 (patient reported not to increase due to retaining CO2); back up to 92% within 1 minute rest in chair    Home Living Family/patient expects to be discharged to:: Private residence Living Arrangements: Spouse/significant other Available Help at Discharge: Family;Available PRN/intermittently Type of Home: House Home Access: Ramped entrance     Home Layout: One level Home Equipment: Shower seat - built in;Wheelchair - power;Walker - 2 wheels;Crutches;Bedside commode      Prior Function Level of Independence: Independent with assistive device(s)         Comments: with R prosthesis; uses wheelchair for longer distances     Hand Dominance        Extremity/Trunk Assessment               Lower Extremity Assessment: RLE deficits/detail RLE Deficits / Details: R LE with prosthesis donned; states varies ply of socks due to fluid shifts       Communication   Communication: No difficulties  Cognition Arousal/Alertness: Awake/alert Behavior During Therapy: WFL for tasks assessed/performed Overall Cognitive Status: Within Functional  Limits for tasks assessed                      General Comments      Exercises        Assessment/Plan    PT Assessment Patient needs continued PT services  PT Diagnosis Difficulty walking   PT Problem List Decreased strength;Decreased activity tolerance;Decreased balance;Decreased safety awareness;Decreased mobility;Decreased knowledge of precautions  PT Treatment Interventions Gait training;DME instruction;Functional mobility training;Therapeutic activities;Patient/family  education;Balance training;Therapeutic exercise   PT Goals (Current goals can be found in the Care Plan section) Acute Rehab PT Goals Patient Stated Goal: To go home ASAP PT Goal Formulation: With patient Time For Goal Achievement: 04/19/15 Potential to Achieve Goals: Good    Frequency Min 3X/week   Barriers to discharge        Co-evaluation               End of Session Equipment Utilized During Treatment: Gait belt;Oxygen Activity Tolerance: Treatment limited secondary to medical complications (Comment) (hypoxemia ambulating on RA) Patient left: in chair;with call bell/phone within reach           Time: 1430-1450 PT Time Calculation (min) (ACUTE ONLY): 20 min   Charges:   PT Evaluation $PT Eval Moderate Complexity: 1 Procedure     PT G CodesReginia Naas 05-16-2015, 3:22 PM  Magda Kiel, [place] 2015/05/16

## 2015-04-17 DIAGNOSIS — E876 Hypokalemia: Secondary | ICD-10-CM

## 2015-04-17 LAB — CBC
HEMATOCRIT: 42 % (ref 39.0–52.0)
HEMOGLOBIN: 11.1 g/dL — AB (ref 13.0–17.0)
MCH: 19.2 pg — AB (ref 26.0–34.0)
MCHC: 26.4 g/dL — AB (ref 30.0–36.0)
MCV: 72.5 fL — ABNORMAL LOW (ref 78.0–100.0)
Platelets: 272 10*3/uL (ref 150–400)
RBC: 5.79 MIL/uL (ref 4.22–5.81)
RDW: 21.6 % — ABNORMAL HIGH (ref 11.5–15.5)
WBC: 12.6 10*3/uL — ABNORMAL HIGH (ref 4.0–10.5)

## 2015-04-17 LAB — GLUCOSE, CAPILLARY
GLUCOSE-CAPILLARY: 136 mg/dL — AB (ref 65–99)
GLUCOSE-CAPILLARY: 140 mg/dL — AB (ref 65–99)
GLUCOSE-CAPILLARY: 143 mg/dL — AB (ref 65–99)
GLUCOSE-CAPILLARY: 143 mg/dL — AB (ref 65–99)
Glucose-Capillary: 128 mg/dL — ABNORMAL HIGH (ref 65–99)
Glucose-Capillary: 138 mg/dL — ABNORMAL HIGH (ref 65–99)
Glucose-Capillary: 161 mg/dL — ABNORMAL HIGH (ref 65–99)

## 2015-04-17 LAB — BASIC METABOLIC PANEL
Anion gap: 11 (ref 5–15)
BUN: 10 mg/dL (ref 6–20)
CHLORIDE: 93 mmol/L — AB (ref 101–111)
CO2: 30 mmol/L (ref 22–32)
CREATININE: 0.72 mg/dL (ref 0.61–1.24)
Calcium: 9.4 mg/dL (ref 8.9–10.3)
GFR calc non Af Amer: >60 mL/min (ref >60)
Glucose, Bld: 108 mg/dL — ABNORMAL HIGH (ref 65–99)
POTASSIUM: 2.9 mmol/L — AB (ref 3.5–5.1)
SODIUM: 134 mmol/L — AB (ref 135–145)

## 2015-04-17 LAB — MAGNESIUM: MAGNESIUM: 2.3 mg/dL (ref 1.7–2.4)

## 2015-04-17 LAB — PHOSPHORUS: PHOSPHORUS: 3.9 mg/dL (ref 2.5–4.6)

## 2015-04-17 MED ORDER — POTASSIUM CHLORIDE CRYS ER 20 MEQ PO TBCR
40.0000 meq | EXTENDED_RELEASE_TABLET | ORAL | Status: AC
  Administered 2015-04-17 (×2): 40 meq via ORAL
  Filled 2015-04-17 (×2): qty 2

## 2015-04-17 NOTE — Progress Notes (Signed)
Placed pt on NIV/ PS Per pt request PS increased to 30( per home use). Vt 1600. MV 27. Pt states he is comfortable with these settings. Pt has home mask. RT will continue to monitor.

## 2015-04-17 NOTE — Progress Notes (Signed)
PROGRESS NOTE  Dean Bowers [id number] DOB: 10-07-1970 DOA: 04/13/2015 PCP: [name], MD  HPI/Recap of past 24 hours:  Feeling better, want to go home, but potassium is very low  Assessment/Plan: Active Problems:   Acute on chronic respiratory failure with hypoxia and hypercapnia (HCC)   SOB (shortness of breath)   Acute on chronic diastolic congestive heart failure (HCC)  Acute on chronic hypercapnic and hypoxia respiratory failure likely due to combination of diastolic chf exacerbation, pulmonary edema, osa, ohs. He was intubated on presentation and admitted to ICU, he was diuresed and extubated on 3/10, he was transferred from [facility] to [facility] on 3/12   hypokalemia: requiring aggressive k supplement.  H/o DVT/PE continue xarelto  noninsulin dependent diabetes: on ssi here  Chronic pain: continue , on neurontin/xanax  at home   Code Status: full  Family Communication: patient   Disposition Plan: likely home on 3/13   Consultants:  PCCM admit on 2/8, transfer to [facility] on 3/12  Procedures:  Intubation on 3/8, extubation on 3/10  Antibiotics:  augmentin   Objective: BP 90/64 mmHg  Pulse 95  Temp(Src) 98.2 F (36.8 C) (Oral)  Resp 19  Ht 6' (1.829 m)  Wt 171 kg (376 lb 15.8 oz)  BMI 51.12 kg/m2  SpO2 91%  Intake/Output Summary (Last 24 hours) at 04/17/15 0838 Last data filed at 04/17/15 0500  Gross per 24 hour  Intake 1029.84 ml  Output   4950 ml  Net -3920.16 ml   Filed Weights   04/15/15 0500 04/16/15 0454 04/17/15 0500  Weight: 173 kg (381 lb 6.3 oz) 170 kg (374 lb 12.5 oz) 171 kg (376 lb 15.8 oz)    Exam:   General:  NAD, obese,  Cardiovascular: RRR  Respiratory: difficult to auscultate due to body habitus, but no wheezing, no rales, no rhonchi appreciated.  Abdomen: Soft/ND/NT, positive BS  Musculoskeletal: No Edema, s/p right bka  Neuro: aaox3  Data Reviewed: Basic Metabolic Panel:  Recent Labs Lab 04/13/15 2250   04/15/15 0350 04/15/15 1955 04/16/15 0240 04/16/15 2015 04/17/15 0416  NA  --   < > 142 140 136 134* 134*  K  --   < > 2.5* 3.3* 2.4* 3.0* 2.9*  CL  --   < > 83* 80* 80* 90* 93*  CO2  --   < > 40* 46* 44* 33* 30  GLUCOSE  --   < > 128* 145* 129* 125* 108*  BUN  --   < > 24* 19 16 11 10   CREATININE  --   < > 0.92 1.01 0.83 0.76 0.72  CALCIUM  --   < > 9.5 9.6 9.4 9.3 9.4  MG 2.1  --  2.0  --  2.0  --  2.3  PHOS 4.8*  --  3.9  --  4.2  --  3.9  < > = values in this interval not displayed. Liver Function Tests: No results for input(s): AST, ALT, ALKPHOS, BILITOT, PROT, ALBUMIN in the last 168 hours. No results for input(s): LIPASE, AMYLASE in the last 168 hours. No results for input(s): AMMONIA in the last 168 hours. CBC:  Recent Labs Lab 04/13/15 1606 04/14/15 0228 04/15/15 0730 04/16/15 0240 04/17/15 0416  WBC 15.9* 11.7* 13.0* 13.7* 12.6*  HGB 10.8* 10.0* 10.6* 10.5* 11.1*  HCT 39.7 36.5* 38.1* 39.5 42.0  MCV 73.8* 72.6* 72.4* 73.1* 72.5*  PLT 301 257 278 263 272   Cardiac Enzymes:    Recent  Labs Lab 04/13/15 2251 04/14/15 0228 04/14/15 0923  TROPONINI 0.03 0.04* 0.03   BNP (last 3 results)  Recent Labs  05/03/14 1813 02/24/15 1525 04/14/15 0228  BNP 451.1* 107.4* 265.6*    ProBNP (last 3 results) No results for input(s): PROBNP in the last 8760 hours.  CBG:  Recent Labs Lab 04/16/15 1213 04/16/15 1611 04/16/15 2016 04/17/15 0056 04/17/15 0531  GLUCAP 211* 136* 123* 138* 143*    Recent Results (from the past 240 hour(s))  Urine culture     Status: None   Collection Time: 04/13/15  9:34 PM  Result Value Ref Range Status   Specimen Description URINE, CATHETERIZED  Final   Special Requests NONE  Final   Culture NO GROWTH 2 DAYS  Final   Report Status 04/15/2015 FINAL  Final  MRSA PCR Screening     Status: None   Collection Time: 04/13/15  9:54 PM  Result Value Ref Range Status   MRSA by PCR NEGATIVE NEGATIVE Final    Comment:        The  GeneXpert MRSA Assay (FDA approved for NASAL specimens only), is one component of a comprehensive MRSA colonization surveillance program. It is not intended to diagnose MRSA infection nor to guide or monitor treatment for MRSA infections.   Culture, respiratory (NON-Expectorated)     Status: None   Collection Time: 04/13/15 10:05 PM  Result Value Ref Range Status   Specimen Description TRACHEAL ASPIRATE  Final   Special Requests Normal  Final   Gram Stain   Final    MODERATE WBC PRESENT,BOTH PMN AND MONONUCLEAR RARE SQUAMOUS EPITHELIAL CELLS PRESENT FEW GRAM POSITIVE COCCI IN PAIRS Performed at [facility]    Culture   Final    NORMAL OROPHARYNGEAL FLORA Performed at [facility]    Report Status 04/15/2015 FINAL  Final  Culture, blood (routine x 2)     Status: None (Preliminary result)   Collection Time: 04/13/15 10:38 PM  Result Value Ref Range Status   Specimen Description BLOOD RIGHT ANTECUBITAL  Final   Special Requests BOTTLES DRAWN AEROBIC ONLY 8CC  Final   Culture NO GROWTH 3 DAYS  Final   Report Status PENDING  Incomplete  Culture, blood (routine x 2)     Status: None (Preliminary result)   Collection Time: 04/13/15 10:54 PM  Result Value Ref Range Status   Specimen Description BLOOD RIGHT HAND  Final   Special Requests BOTTLES DRAWN AEROBIC ONLY 6CC  Final   Culture NO GROWTH 3 DAYS  Final   Report Status PENDING  Incomplete     Studies: No results found.  Scheduled Meds: . amoxicillin-clavulanate  1 tablet Oral Q12H  . budesonide (PULMICORT) nebulizer solution  0.5 mg Nebulization BID  . insulin aspart  2-6 Units Subcutaneous 6 times per day  . ipratropium-albuterol  3 mL Nebulization TID  . potassium chloride  40 mEq Oral Q4H  . rivaroxaban  20 mg Oral Q supper  . sodium chloride flush  3 mL Intravenous Q12H    Continuous Infusions:    Time spent: 64mins  Adelaido Nicklaus MD, PhD  [facility] Pager [phone]. If 7PM-7AM, please  contact night-coverage at [email or web address], password [facility] 04/17/2015, 8:38 AM  LOS: 4 days

## 2015-04-17 NOTE — Progress Notes (Signed)
This is a no charge note    Pick up from PCCM in AM per Dr. Nelda Marseille.  Ivor Costa, MD  [facility] Pager [phone]  If 7PM-7AM, please contact night-coverage [email or web address] Password [facility] 04/17/2015, 6:12 AM

## 2015-04-18 DIAGNOSIS — G4733 Obstructive sleep apnea (adult) (pediatric): Secondary | ICD-10-CM

## 2015-04-18 LAB — COMPREHENSIVE METABOLIC PANEL
ALK PHOS: 61 U/L (ref 38–126)
ALT: 70 U/L — AB (ref 17–63)
AST: 53 U/L — AB (ref 15–41)
Albumin: 2.9 g/dL — ABNORMAL LOW (ref 3.5–5.0)
Anion gap: 10 (ref 5–15)
BILIRUBIN TOTAL: 0.9 mg/dL (ref 0.3–1.2)
BUN: 11 mg/dL (ref 6–20)
CHLORIDE: 101 mmol/L (ref 101–111)
CO2: 27 mmol/L (ref 22–32)
CREATININE: 0.66 mg/dL (ref 0.61–1.24)
Calcium: 9.3 mg/dL (ref 8.9–10.3)
GFR calc Af Amer: >60 mL/min (ref >60)
Glucose, Bld: 102 mg/dL — ABNORMAL HIGH (ref 65–99)
Potassium: 3 mmol/L — ABNORMAL LOW (ref 3.5–5.1)
Sodium: 138 mmol/L (ref 135–145)
TOTAL PROTEIN: 8 g/dL (ref 6.5–8.1)

## 2015-04-18 LAB — CULTURE, BLOOD (ROUTINE X 2)
Culture: NO GROWTH
Culture: NO GROWTH

## 2015-04-18 LAB — CBC
HEMATOCRIT: 42.3 % (ref 39.0–52.0)
HEMOGLOBIN: 10.9 g/dL — AB (ref 13.0–17.0)
MCH: 18.8 pg — ABNORMAL LOW (ref 26.0–34.0)
MCHC: 25.8 g/dL — ABNORMAL LOW (ref 30.0–36.0)
MCV: 72.9 fL — AB (ref 78.0–100.0)
Platelets: 302 10*3/uL (ref 150–400)
RBC: 5.8 MIL/uL (ref 4.22–5.81)
RDW: 21.8 % — ABNORMAL HIGH (ref 11.5–15.5)
WBC: 13.5 10*3/uL — AB (ref 4.0–10.5)

## 2015-04-18 LAB — GLUCOSE, CAPILLARY
GLUCOSE-CAPILLARY: 106 mg/dL — AB (ref 65–99)
Glucose-Capillary: 142 mg/dL — ABNORMAL HIGH (ref 65–99)

## 2015-04-18 MED ORDER — CARTIA XT 120 MG PO CP24: ORAL_CAPSULE | ORAL | Status: DC

## 2015-04-18 MED ORDER — AMOXICILLIN-POT CLAVULANATE 875-125 MG PO TABS: 1.0000 | ORAL_TABLET | Freq: Two times a day (BID) | ORAL | Status: DC

## 2015-04-18 MED ORDER — ASPIRIN EC 81 MG PO TBEC
81.0000 mg | DELAYED_RELEASE_TABLET | Freq: Every day | ORAL | Status: DC
  Administered 2015-04-18: 81 mg via ORAL
  Filled 2015-04-18: qty 1

## 2015-04-18 MED ORDER — POTASSIUM CHLORIDE CRYS ER 20 MEQ PO TBCR
40.0000 meq | EXTENDED_RELEASE_TABLET | ORAL | Status: AC
  Administered 2015-04-18 (×2): 40 meq via ORAL
  Filled 2015-04-18 (×2): qty 2

## 2015-04-18 MED ORDER — TORSEMIDE 20 MG PO TABS: ORAL_TABLET | ORAL | Status: DC

## 2015-04-18 MED ORDER — GABAPENTIN 600 MG PO TABS: 600.0000 mg | ORAL_TABLET | Freq: Every day | ORAL | Status: DC

## 2015-04-18 MED ORDER — METOLAZONE 2.5 MG PO TABS: 2.5000 mg | ORAL_TABLET | ORAL | Status: DC

## 2015-04-18 MED ORDER — METOLAZONE 2.5 MG PO TABS
2.5000 mg | ORAL_TABLET | Freq: Every day | ORAL | Status: DC | PRN
Start: 2015-04-18 — End: 2015-04-18

## 2015-04-18 NOTE — Progress Notes (Signed)
Pt discharged per W/C by NT with all his belongings including Rt leg prosthesis Has scheduled appts - wife wanted to make them & his discharge instructions Pt has his cell phone & charger

## 2015-04-18 NOTE — Plan of Care (Signed)
Problem: ICU Phase Progression Outcomes Goal: Voiding-avoid urinary catheter unless indicated Outcome: Progressing Patient still having increased urinary output.

## 2015-04-18 NOTE — Care Management Important Message (Signed)
Important Message  Patient Details  Name: Dean Bowers MRN: [id number] Date of Birth: 1970-11-05   Medicare Important Message Given:  Yes    Catelin Manthe T, RN 04/18/2015, 9:14 AM

## 2015-04-18 NOTE — Plan of Care (Signed)
Problem: ICU Phase Progression Outcomes Goal: Voiding-avoid urinary catheter unless indicated Outcome: Progressing Patient given his first dose of IV diuretic Diamox with increased urinary output

## 2015-04-18 NOTE — Progress Notes (Signed)
Physical Therapy Treatment & Discharge Patient Details Name: Dean Bowers MRN: [id number] DOB: 1970/04/13 Today's Date: 04/18/2015    History of Present Illness 45 year old male with PMH as below which includes OSA/OHS on home trilogy w/ 3L 02, HTN, HLD, R BKA, and PE. He was recently admitted to [facility] with OHS and CHF exacerbations requiring intubation and again for peritonsilar abscess managed medically. He was discharged on amoxicillin for 2 weeks which he has finished. He has been seen by cardiology twice since discharge. He has been gaining weight despite reportedly strict adherence to diuretic regimen and diet. Admitted with acute hypoxic respiratory failyure requring bipap.    PT Comments    Pt admitted with above diagnosis. Pt ambulates with R BK Prosthesis with supervision for lines/leads. Pt's SaO2 ranged from 82-89% on room air with ambulation 120' and pt states that is normal for him. Pt has home O2, but he states that he only wants to wear it while he is sleeping. Pt is currently at baseline functioning and is going home today. PT signing off at this time.    Follow Up Recommendations  No PT follow up     Equipment Recommendations  None recommended by PT    Recommendations for Other Services       Precautions / Restrictions Precautions Precautions: Fall Required Braces or Orthoses: Other Brace/Splint Other Brace/Splint: R BK Prosthesis Restrictions Weight Bearing Restrictions: No    Mobility  Bed Mobility               General bed mobility comments: Pt sitting EOB upon entry to room.   Transfers Overall transfer level: Needs assistance Equipment used: None Transfers: Sit to/from Stand Sit to Stand: Supervision         General transfer comment: Stood from EOB with supervision for safety due to lines/leads  Ambulation/Gait Ambulation/Gait assistance: Supervision Ambulation Distance (Feet): 120 Feet Assistive device: None Gait Pattern/deviations:  Step-through pattern;Decreased stride length;Trendelenburg;Drifts right/left;Wide base of support Gait velocity: decreased Gait velocity interpretation: Below normal speed for age/gender General Gait Details: increased BOS and increased lateral excursion due to R LE prosthetic   Stairs            Wheelchair Mobility    Modified Rankin (Stroke Patients Only)       Balance Overall balance assessment: Needs assistance Sitting-balance support: Feet unsupported;No upper extremity supported Sitting balance-Leahy Scale: Normal     Standing balance support: No upper extremity supported Standing balance-Leahy Scale: Good                      Cognition Arousal/Alertness: Awake/alert Behavior During Therapy: WFL for tasks assessed/performed Overall Cognitive Status: Within Functional Limits for tasks assessed                      Exercises      General Comments General comments (skin integrity, edema, etc.): Pt's wife helped don/doff prosthesis and put his shoe on his L foot.       Pertinent Vitals/Pain Pain Assessment: No/denies pain    Home Living                      Prior Function            PT Goals (current goals can now be found in the care plan section) Acute Rehab PT Goals Patient Stated Goal: To go home and see my little girl  PT Goal Formulation: With  patient Time For Goal Achievement: 04/19/15 Potential to Achieve Goals: Good Progress towards PT goals: Goals met/education completed, patient discharged from PT    Frequency       PT Plan Current plan remains appropriate    Co-evaluation             End of Session Equipment Utilized During Treatment: Gait belt Activity Tolerance: Patient tolerated treatment well Patient left: in bed;with call bell/phone within reach;with family/visitor present     Time: 1916-6060 PT Time Calculation (min) (ACUTE ONLY): 22 min  Charges:  $Gait Training: 8-22 mins                     G Codes:      Colon Branch, SPT Colon Branch 04/18/2015, 10:05 AM

## 2015-04-18 NOTE — Care Management Note (Signed)
Case Management Note  Patient Details  Name: SHADDAI PUGLIA MRN: [id number] Date of Birth: 1970/03/09  Subjective/Objective:  Pt lives with spouse, has w/c, walker, BSC, and shower seat.  Previous CM called and left message with [facility] requesting TC to pt who wants to f/u with them, also provided information to spouse on bariatric scale that can be purchased @ [employer] - advised her to call insurance provider to determine insurance reimbursement for same.  Pt and spouse agree to home health nurse who will enroll pt in disease management program-called [facility] [place] liaison with referral.                            Expected Discharge Plan:  [place]  Discharge planning Services  CM Consult  Post Acute Care Choice:  Home Health Choice offered to:  Patient  HH Arranged:  RN, Disease Management [place] Agency:  [facility] [place]  Status of Service:  Completed, signed off  Girard Cooter, RN 04/18/2015, 9:02 AM

## 2015-04-18 NOTE — Discharge Summary (Addendum)
Discharge Summary  Dean Bowers [id number] DOB: [birth year]  PCP: [name], MD  Admit date: 04/13/2015 Discharge date: 04/18/2015  Time spent: >15mins  Recommendations for Outpatient Follow-up:  1. F/u with PMD in three days and then in a week for hospital discharge follow up, repeat cbc/bmp at follow up, pmd to continue adjust bp meds. 2. F/u with cardiology [name] as scheduled   Discharge Diagnoses:  Active Hospital Problems   Diagnosis Date Noted  . Acute on chronic respiratory failure with hypoxia and hypercapnia ([place]) 04/13/2015  . SOB (shortness of breath)   . Acute on chronic diastolic congestive heart failure [facility])     Resolved Hospital Problems   Diagnosis Date Noted Date Resolved  No resolved problems to display.    Discharge Condition: stable  Diet recommendation: heart healthy/carb modified  Filed Weights   04/16/15 0454 04/17/15 0500 04/18/15 0340  Weight: 170 kg (374 lb 12.5 oz) 171 kg (376 lb 15.8 oz) 169 kg (372 lb 9.2 oz)    History of present illness:  45 year old male with PMH as below which includes OSA/OHS on home trilogy w/ 3L 02, HTN, HLD, R BKA, and PE. He was recently admitted to [facility] with OHS and CHF exacerbations requiring intubation and again for peritonsilar abscess managed medically. He was discharged on amoxicillin for 2 weeks which he has finished. He has been seen by cardiology twice since discharge. He has been gaining weight despite reportedly strict adherence to diuretic regimen and diet. His torsemide dose has been significantly increased and he was recently started on metolazone.  He has continued to have significant weight gain. Presented to the cardiology office 3/8 for a weight check where it was noted that he was short of breath with SpO2 80%. He was referred to [facility] department where sats were in the 5s. He was started on supplemental O2 and became increasingly somnolent. ABG showed respiratory acidosis with  and he was started on BiPAP. He somnolence continued. PCCM called for further evaluation.   Hospital Course:  Active Problems:   Acute on chronic respiratory failure with hypoxia and hypercapnia (H[facility])   SOB (shortness of breath)   Acute on chronic diastolic congestive heart failure ([facility])  Acute on chronic hypercapnic and hypoxia respiratory failure likely due to combination of diastolic chf exacerbation, pulmonary edema, osa, ohs. He was intubated on presentation and admitted to ICU, he was diuresed and extubated on 3/10, he was transferred from [facility] to [facility] on 3/12. Extensive education provided , advised patient to perform daily weight, low sodium diet and fluids restriction, close follow up with pmd and cardiology for k/cr and bp monitoring.  He is likely close to his dry weight (169kg, 372lb) at discharge, his lung are clear, no edema, he is discharged on demadex 40mg  qam and 20mg  qpm, his home meds zaroxyolyn was changed from three time a week to two times a week.  H/o HTN: did not require blood pressure meds in the hosptial, bp well controlled, hold home bp meds, advised patient  To check blood pressure at home, f/u with pmd in three days to discuss blood pressure meds.  hypokalemia: requiring aggressive k supplement. Discharged on potassium 64meq bid, repeat k in three days at primary care physician's office.  H/o DVT/PE continue xarelto  noninsulin dependent diabetes: on ssi here, a1c 6.5, diet controlled.  Left lower extremity cellulitis: healing, continue augmentin, pmd follow up.  Chronic pain: s/p RBKA,  on neurontin/xanax at home  Morbid obesity: life style changes, weight loss education provided.     Code Status: full  Family Communication: patient and wife  Disposition Plan:  home on 3/13   Consultants:  PCCM admit on 2/8, transfer to [facility] on 3/12  Procedures:  Intubation on 3/8, extubation on 3/10  Antibiotics:  augmentin   Discharge Exam: BP 102/55  mmHg  Pulse 95  Temp(Src) 98.2 F (36.8 C) (Oral)  Resp 18  Ht 6' (1.829 m)  Wt 169 kg (372 lb 9.2 oz)  BMI 50.52 kg/m2  SpO2 94%  General: * Cardiovascular: * Respiratory: *  Discharge Instructions You were cared for by a hospitalist during your hospital stay. If you have any questions about your discharge medications or the care you received while you were in the hospital after you are discharged, you can call the unit and asked to speak with the hospitalist on call if the hospitalist that took care of you is not available. Once you are discharged, your primary care physician will handle any further medical issues. Please note that NO REFILLS for any discharge medications will be authorized once you are discharged, as it is imperative that you return to your primary care physician (or establish a relationship with a primary care physician if you do not have one) for your aftercare needs so that they can reassess your need for medications and monitor your lab values.      Discharge Instructions    Diet - low sodium heart healthy    Complete by:  As directed      Face-to-face encounter (required for Medicare/Medicaid patients)    Complete by:  As directed   I Lurene Robley certify that this patient is under my care and that I, or a nurse practitioner or physician's assistant working with me, had a face-to-face encounter that meets the physician face-to-face encounter requirements with this patient on 04/18/2015. The encounter with the patient was in whole, or in part for the following medical condition(s) which is the primary reason for home health care (List medical condition): sob, chf  The encounter with the patient was in whole, or in part, for the following medical condition, which is the primary reason for home health care:  chf/respiratory failure  I certify that, based on my findings, the following services are medically necessary home health services:  Nursing  Reason for Medically  Necessary Home Health Services:  Skilled Nursing- Change/Decline in Patient Status  My clinical findings support the need for the above services:  Shortness of breath with activity  Further, I certify that my clinical findings support that this patient is homebound due to:  Shortness of Breath with activity     Home Health    Complete by:  As directed   To provide the following care/treatments:  RN     Increase activity slowly    Complete by:  As directed             Medication List    TAKE these medications        ALPRAZolam 0.25 MG tablet  Commonly known as:  XANAX  Take 0.25 mg by mouth 2 (two) times daily as needed for anxiety.     amoxicillin-clavulanate 875-125 MG tablet  Commonly known as:  AUGMENTIN  Take 1 tablet by mouth every 12 (twelve) hours.     aspirin 81 MG EC tablet  Take 1 tablet (81 mg total) by mouth daily.     CARTIA XT 120 MG 24 hr  capsule  Generic drug:  diltiazem  Hold blood pressure medicine cartia for three days, please see primary care doctor to discuss resumption of this medicine.     diclofenac 75 MG EC tablet  Commonly known as:  VOLTAREN  TAKE 1 TABLET(75 MG) BY MOUTH TWICE DAILY As needed for pain     gabapentin 600 MG tablet  Commonly known as:  NEURONTIN  Take 1 tablet (600 mg total) by mouth at bedtime.     metolazone 2.5 MG tablet  Commonly known as:  ZAROXOLYN  Take 1 tablet (2.5 mg total) by mouth 2 (two) times a week. TAKE every Tuesday and Friday, take 34mins before torsemide     multivitamin with minerals tablet  Take 1 tablet by mouth daily.     NON FORMULARY  Inhale 1 application into the lungs at bedtime. trilogy noninvasive ventilation     oxyCODONE 5 MG immediate release tablet  Commonly known as:  Oxy IR/ROXICODONE  Take 5 mg by mouth 2 (two) times daily as needed for severe pain.     OXYGEN  Inhale 3 L into the lungs at bedtime.     Potassium Chloride ER 20 MEQ Tbcr  Take 40 mEq by mouth 2 (two) times daily.  TAKE  AN EXTRA  TABLET WITH METOLAZONE. WHEN TAKING .3 X A WEEK     PROVENTIL HFA 108 (90 Base) MCG/ACT inhaler  Generic drug:  albuterol  INHALE 2 PUFFS BY MOUTH EVERY 6 HOURS AS NEEDED FOR WHEEZING OR SHORTNESS OF BREATH     rivaroxaban 20 MG Tabs tablet  Commonly known as:  XARELTO  Take 20 mg by mouth daily.     sildenafil 20 MG tablet  Commonly known as:  REVATIO  Take 20-40 mg by mouth daily as needed (ED).     Testosterone 20.25 MG/ACT (1.62%) Gel  Commonly known as:  ANDROGEL PUMP  Place 4 application onto the skin daily.     torsemide 20 MG tablet  Commonly known as:  DEMADEX  Take 40mg  (two 20mg  tabs in the morning), take 20mg  in the afternoon       Allergies  Allergen Reactions  . Lyrica [Pregabalin] Other (See Comments)    Hallucinations    Follow-up Information    Follow up with [name], MD In 3 days.   Specialty:  Internal Medicine   Why:  and in one week, hospital follow up, repeat bmp at both follow ups, also adjust blood pressure meds   Contact information:   [address] [place] [id number] [phone]        The results of significant diagnostics from this hospitalization (including imaging, microbiology, ancillary and laboratory) are listed below for reference.    Significant Diagnostic Studies: Dg Chest Port 1 View  04/15/2015  CLINICAL DATA:  Endotracheal tube EXAM: PORTABLE CHEST 1 VIEW COMPARISON:  04/13/2015 FINDINGS: Endotracheal tube tip is 5.3 cm above the carina. NG tube extends off the inferior edge of the film. Moderate to severe cardiac enlargement. Severe venous congestion and moderate interstitial prominence. IMPRESSION: Congestive heart failure with pulmonary edema. Endotracheal tube tip 5.3 cm above the carina. Electronically Signed   By: Skipper Cliche M.D.   On: 04/15/2015 07:26   Dg Chest Port 1 View  04/13/2015  CLINICAL DATA:  Status post ET tube and NG tube placement today. Initial encounter. EXAM:  PORTABLE CHEST 1 VIEW COMPARISON:  Single view of the chest earlier today. FINDINGS: NG tube is in  place and courses into the stomach and below the inferior margin of the film. Endotracheal tube tip is at the level of the top of the clavicular heads, 8a cm above the carina. Cardiomegaly and pulmonary vascular congestion are again seen. IMPRESSION: ET tube tip is 7.9 cm above the carina. The tube could be advanced 1-2 cm for better positioning. NG tube is in good position. Cardiomegaly and vascular congestion. Electronically Signed   By: Inge Rise M.D.   On: 04/13/2015 20:15   Dg Chest Portable 1 View  04/13/2015  CLINICAL DATA:  Fluid retention, abdominal swelling, diagnosis of congestive heart failure, shortness of breath EXAM: PORTABLE CHEST 1 VIEW COMPARISON:  03/13/2015 FINDINGS: Study limited by body habitus and limited inspiratory effect. Moderate to severe cardiac silhouette enlargement stable. Vascular pattern appears normal. Right lung shows mild lower lobe atelectasis. Left lower lobe not well evaluated. IMPRESSION: Limited study showing cardiac enlargement with no definite acute findings Electronically Signed   By: Skipper Cliche M.D.   On: 04/13/2015 17:48    Microbiology: Recent Results (from the past 240 hour(s))  Urine culture     Status: None   Collection Time: 04/13/15  9:34 PM  Result Value Ref Range Status   Specimen Description URINE, CATHETERIZED  Final   Special Requests NONE  Final   Culture NO GROWTH 2 DAYS  Final   Report Status 04/15/2015 FINAL  Final  MRSA PCR Screening     Status: None   Collection Time: 04/13/15  9:54 PM  Result Value Ref Range Status   MRSA by PCR NEGATIVE NEGATIVE Final    Comment:        The GeneXpert MRSA Assay (FDA approved for NASAL specimens only), is one component of a comprehensive MRSA colonization surveillance program. It is not intended to diagnose MRSA infection nor to guide or monitor treatment for MRSA infections.     Culture, respiratory (NON-Expectorated)     Status: None   Collection Time: 04/13/15 10:05 PM  Result Value Ref Range Status   Specimen Description TRACHEAL ASPIRATE  Final   Special Requests Normal  Final   Gram Stain   Final    MODERATE WBC PRESENT,BOTH PMN AND MONONUCLEAR RARE SQUAMOUS EPITHELIAL CELLS PRESENT FEW GRAM POSITIVE COCCI IN PAIRS Performed at [facility]    Culture   Final    NORMAL OROPHARYNGEAL FLORA Performed at [facility]    Report Status 04/15/2015 FINAL  Final  Culture, blood (routine x 2)     Status: None (Preliminary result)   Collection Time: 04/13/15 10:38 PM  Result Value Ref Range Status   Specimen Description BLOOD RIGHT ANTECUBITAL  Final   Special Requests BOTTLES DRAWN AEROBIC ONLY 8CC  Final   Culture NO GROWTH 4 DAYS  Final   Report Status PENDING  Incomplete  Culture, blood (routine x 2)     Status: None (Preliminary result)   Collection Time: 04/13/15 10:54 PM  Result Value Ref Range Status   Specimen Description BLOOD RIGHT HAND  Final   Special Requests BOTTLES DRAWN AEROBIC ONLY 6CC  Final   Culture NO GROWTH 4 DAYS  Final   Report Status PENDING  Incomplete     Labs: Basic Metabolic Panel:  Recent Labs Lab 04/13/15 2250  04/15/15 0350 04/15/15 1955 04/16/15 0240 04/16/15 2015 04/17/15 0416 04/18/15 0238  NA  --   < > 142 140 136 134* 134* 138  K  --   < > 2.5* 3.3*  2.4* 3.0* 2.9* 3.0*  CL  --   < > 83* 80* 80* 90* 93* 101  CO2  --   < > 40* 46* 44* 33* 30 27  GLUCOSE  --   < > 128* 145* 129* 125* 108* 102*  BUN  --   < > 24* 19 16 11 10 11   CREATININE  --   < > 0.92 1.01 0.83 0.76 0.72 0.66  CALCIUM  --   < > 9.5 9.6 9.4 9.3 9.4 9.3  MG 2.1  --  2.0  --  2.0  --  2.3  --   PHOS 4.8*  --  3.9  --  4.2  --  3.9  --   < > = values in this interval not displayed. Liver Function Tests:  Recent Labs Lab 04/18/15 0238  AST 53*  ALT 70*  ALKPHOS 61  BILITOT 0.9  PROT 8.0  ALBUMIN 2.9*   No results  for input(s): LIPASE, AMYLASE in the last 168 hours. No results for input(s): AMMONIA in the last 168 hours. CBC:  Recent Labs Lab 04/14/15 0228 04/15/15 0730 04/16/15 0240 04/17/15 0416 04/18/15 0238  WBC 11.7* 13.0* 13.7* 12.6* 13.5*  HGB 10.0* 10.6* 10.5* 11.1* 10.9*  HCT 36.5* 38.1* 39.5 42.0 42.3  MCV 72.6* 72.4* 73.1* 72.5* 72.9*  PLT 257 278 263 272 302   Cardiac Enzymes:  Recent Labs Lab 04/13/15 2251 04/14/15 0228 04/14/15 0923  TROPONINI 0.03 0.04* 0.03   BNP: BNP (last 3 results)  Recent Labs  05/03/14 1813 02/24/15 1525 04/14/15 0228  BNP 451.1* 107.4* 265.6*    ProBNP (last 3 results) No results for input(s): PROBNP in the last 8760 hours.  CBG:  Recent Labs Lab 04/17/15 1230 04/17/15 1628 04/17/15 2119 04/17/15 2338 04/18/15 0346  GLUCAP 140* 128* 161* 136* 106*       Signed:  Rylin Saez MD, PhD  [facility] 04/18/2015, 8:54 AM

## 2015-04-18 NOTE — Progress Notes (Signed)
Went over all discharge instructions with pt and his wife.

## 2015-04-22 ENCOUNTER — Telehealth: Payer: Self-pay | Admitting: Internal Medicine

## 2015-04-22 ENCOUNTER — Ambulatory Visit (INDEPENDENT_AMBULATORY_CARE_PROVIDER_SITE_OTHER): Payer: 59 | Admitting: Internal Medicine

## 2015-04-22 ENCOUNTER — Encounter: Payer: Self-pay | Admitting: Internal Medicine

## 2015-04-22 ENCOUNTER — Other Ambulatory Visit: Payer: Self-pay | Admitting: Family Medicine

## 2015-04-22 ENCOUNTER — Other Ambulatory Visit (INDEPENDENT_AMBULATORY_CARE_PROVIDER_SITE_OTHER): Payer: 59

## 2015-04-22 VITALS — BP 128/84 | HR 106 | Temp 98.4°F | Ht 69.0 in | Wt 393.0 lb

## 2015-04-22 DIAGNOSIS — E785 Hyperlipidemia, unspecified: Secondary | ICD-10-CM

## 2015-04-22 DIAGNOSIS — I5043 Acute on chronic combined systolic (congestive) and diastolic (congestive) heart failure: Secondary | ICD-10-CM

## 2015-04-22 DIAGNOSIS — I10 Essential (primary) hypertension: Secondary | ICD-10-CM | POA: Diagnosis not present

## 2015-04-22 DIAGNOSIS — E1169 Type 2 diabetes mellitus with other specified complication: Secondary | ICD-10-CM | POA: Diagnosis not present

## 2015-04-22 DIAGNOSIS — R7302 Impaired glucose tolerance (oral): Secondary | ICD-10-CM | POA: Diagnosis not present

## 2015-04-22 LAB — BASIC METABOLIC PANEL
BUN: 20 mg/dL (ref 6–23)
CALCIUM: 9.8 mg/dL (ref 8.4–10.5)
CO2: 38 mEq/L — ABNORMAL HIGH (ref 19–32)
Chloride: 87 mEq/L — ABNORMAL LOW (ref 96–112)
Creatinine, Ser: 0.96 mg/dL (ref 0.40–1.50)
GFR: 89.77 mL/min (ref >60.00)
GLUCOSE: 115 mg/dL — AB (ref 70–99)
Potassium: 2.9 mEq/L — ABNORMAL LOW (ref 3.5–5.1)
SODIUM: 136 meq/L (ref 135–145)

## 2015-04-22 LAB — CBC WITH DIFFERENTIAL/PLATELET
Basophils Absolute: 0.1 10*3/uL (ref 0.0–0.1)
Basophils Relative: 0.8 % (ref 0.0–3.0)
EOS PCT: 2.3 % (ref 0.0–5.0)
Eosinophils Absolute: 0.2 10*3/uL (ref 0.0–0.7)
HEMATOCRIT: 44 % (ref 39.0–52.0)
HEMOGLOBIN: 13 g/dL (ref 13.0–17.0)
LYMPHS ABS: 2.4 10*3/uL (ref 0.7–4.0)
Lymphocytes Relative: 22.9 % (ref 12.0–46.0)
MONOS PCT: 8.5 % (ref 3.0–12.0)
Monocytes Absolute: 0.9 10*3/uL (ref 0.1–1.0)
Neutro Abs: 6.8 10*3/uL (ref 1.4–7.7)
Neutrophils Relative %: 65.5 % (ref 43.0–77.0)
Platelets: 333 10*3/uL (ref 150.0–400.0)
RBC: 6.61 Mil/uL — AB (ref 4.22–5.81)
RDW: 23 % — ABNORMAL HIGH (ref 11.5–15.5)
WBC: 10.4 10*3/uL (ref 4.0–10.5)

## 2015-04-22 LAB — HEPATIC FUNCTION PANEL
ALBUMIN: 3.7 g/dL (ref 3.5–5.2)
ALT: 62 U/L — AB (ref 0–53)
AST: 26 U/L (ref 0–37)
Alkaline Phosphatase: 68 U/L (ref 39–117)
Bilirubin, Direct: 0.1 mg/dL (ref 0.0–0.3)
Total Bilirubin: 0.5 mg/dL (ref 0.2–1.2)
Total Protein: 8.8 g/dL — ABNORMAL HIGH (ref 6.0–8.3)

## 2015-04-22 NOTE — Progress Notes (Signed)
Pre visit review using our clinic review tool, if applicable. No additional management support is needed unless otherwise documented below in the visit note. 

## 2015-04-22 NOTE — Telephone Encounter (Signed)
Melissa from [facility] is requesting verbal orders for  tele health unit that tracks bp weight, hr and oxygen level.  Skilled nursing  2 x 1 wk 1 x Every other week for 7 wks.  Wanting to inform you of Med interactions on med list.  Aspirin is duplicate with xerelto cartia xt has interaction with oxycodone and xerelto giclosenac sodium has interaction with torsemide and xerelto.

## 2015-04-22 NOTE — Patient Instructions (Addendum)
Please continue all other medications as before, and refills have been done if requested.  Please have the pharmacy call with any other refills you may need.  Please continue your efforts at being more active, low cholesterol diet, and weight control.  You are otherwise up to date with prevention measures today.  Please keep your appointments with your specialists as you may have planned  Please go to the LAB in the Basement (turn left off the elevator) for the tests to be done today  You will be contacted by phone if any changes need to be made immediately.  Otherwise, you will receive a letter about your results with an explanation, but please check with MyChart first.  Please remember to sign up for MyChart if you have not done so, as this will be important to you in the future with finding out test results, communicating by private email, and scheduling acute appointments online when needed.  Please go to the LAB in the Basement (turn left off the elevator) for the tests to be done ALSO in 1 week to repeat the BMP (potassium and kidney tests) part  Please also plan to follow up with Dr Sharlet Salina in 4 weeks

## 2015-04-22 NOTE — Telephone Encounter (Signed)
Called and gave verbal orders. I just wanted you to see this message to review the medication interactions they have pointed out.
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
## 2015-04-22 NOTE — Telephone Encounter (Signed)
Patient coming in today to see Dr. Jenny Reichmann. He must leave a UDS today, or Dr. Sharlet Salina will no longer prescribe narcotics.

## 2015-04-22 NOTE — Telephone Encounter (Signed)
He needs to come in today for UDS. Was called and notified of need to come in within 24 hours on 04/21/15 if he wants any further refills. Meds okay. Okay with verbal.

## 2015-04-23 NOTE — Assessment & Plan Note (Addendum)
stable overall by history and exam, recent data reviewed with pt and not felt to be accurate, today with prosthesis and euvolemic by exam, and pt to continue medical treatment as before,  to f/u any worsening symptoms or concerns Wt Readings from Last 3 Encounters:  04/22/15 393 lb (178.264 kg)  04/18/15 372 lb 9.2 oz (169 kg)  04/13/15 439 lb 12 oz (199.469 kg)  for lab today ordered, also repeat at 1 wk

## 2015-04-23 NOTE — Assessment & Plan Note (Signed)
stable overall by history and exam, recent data reviewed with pt, and pt to continue medical treatment as before,  to f/u any worsening symptoms or concerns BP Readings from Last 3 Encounters:  04/22/15 128/84  04/18/15 119/69  04/13/15 136/62

## 2015-04-23 NOTE — Assessment & Plan Note (Signed)
stable overall by history and exam, recent data reviewed with pt, and pt to continue medical treatment as before,  to f/u any worsening symptoms or concerns Lab Results  Component Value Date   HGBA1C 6.5* 02/25/2015

## 2015-04-23 NOTE — Assessment & Plan Note (Signed)
stable overall by history and exam, recent data reviewed with pt, and pt to continue medical treatment as before,  to f/u any worsening symptoms or concerns Lab Results  Component Value Date   LDLCALC 82 05/27/2013

## 2015-04-26 ENCOUNTER — Ambulatory Visit (INDEPENDENT_AMBULATORY_CARE_PROVIDER_SITE_OTHER): Payer: 59 | Admitting: Cardiology

## 2015-04-26 ENCOUNTER — Other Ambulatory Visit: Payer: Self-pay | Admitting: Internal Medicine

## 2015-04-26 ENCOUNTER — Encounter: Payer: Self-pay | Admitting: Cardiology

## 2015-04-26 VITALS — BP 130/82 | HR 120 | Ht 69.0 in | Wt 390.8 lb

## 2015-04-26 DIAGNOSIS — Z89519 Acquired absence of unspecified leg below knee: Secondary | ICD-10-CM | POA: Insufficient documentation

## 2015-04-26 DIAGNOSIS — E662 Morbid (severe) obesity with alveolar hypoventilation: Secondary | ICD-10-CM

## 2015-04-26 DIAGNOSIS — I5032 Chronic diastolic (congestive) heart failure: Secondary | ICD-10-CM

## 2015-04-26 DIAGNOSIS — I5043 Acute on chronic combined systolic (congestive) and diastolic (congestive) heart failure: Secondary | ICD-10-CM

## 2015-04-26 DIAGNOSIS — I2699 Other pulmonary embolism without acute cor pulmonale: Secondary | ICD-10-CM

## 2015-04-26 DIAGNOSIS — Z89511 Acquired absence of right leg below knee: Secondary | ICD-10-CM

## 2015-04-26 MED ORDER — POTASSIUM CHLORIDE ER 20 MEQ PO TBCR: EXTENDED_RELEASE_TABLET | ORAL | Status: DC

## 2015-04-26 NOTE — Patient Instructions (Signed)
Medication Instructions:  None  Labwork: None  Testing/Procedures: None  Follow-Up: Your physician recommends that you schedule a follow-up appointment in: 1-2 weeks in CHF clinic to re-establish.    Any Other Special Instructions Will Be Listed Below (If Applicable).     If you need a refill on your cardiac medications before your next appointment, please call your pharmacy.
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
## 2015-04-26 NOTE — Assessment & Plan Note (Signed)
Seen today post hospital- doing pretty well, wgt stable.

## 2015-04-26 NOTE — Assessment & Plan Note (Signed)
EF 45-50% by Jan 2017

## 2015-04-26 NOTE — Assessment & Plan Note (Signed)
BMI 57- he is on BiPap at night. He has been intubated several times secondary to hypercapnia
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
## 2015-04-26 NOTE — Assessment & Plan Note (Signed)
March 2016- on Xarelto

## 2015-04-27 ENCOUNTER — Other Ambulatory Visit: Payer: Self-pay | Admitting: Family Medicine

## 2015-04-27 NOTE — Telephone Encounter (Signed)
Sent in, can discuss at next visit.

## 2015-04-27 NOTE — Telephone Encounter (Signed)
Last seen 08/17/14 Androgel last filled 10/20/14  No record of you filling Alprazolam

## 2015-04-27 NOTE — Telephone Encounter (Signed)
i'm not showing diclofenac on current med list, and not sure if you are prescribing xarelto----are you ok with refilling?---please advise, thanks

## 2015-04-29 ENCOUNTER — Other Ambulatory Visit (INDEPENDENT_AMBULATORY_CARE_PROVIDER_SITE_OTHER): Payer: 59

## 2015-04-29 ENCOUNTER — Ambulatory Visit: Payer: Medicare Other | Admitting: Internal Medicine

## 2015-04-29 DIAGNOSIS — I5043 Acute on chronic combined systolic (congestive) and diastolic (congestive) heart failure: Secondary | ICD-10-CM

## 2015-04-29 LAB — BASIC METABOLIC PANEL
BUN: 23 mg/dL (ref 6–23)
CHLORIDE: 79 meq/L — AB (ref 96–112)
CO2: 42 meq/L — AB (ref 19–32)
Calcium: 10.1 mg/dL (ref 8.4–10.5)
Creatinine, Ser: 0.95 mg/dL (ref 0.40–1.50)
GFR: 90.86 mL/min (ref >60.00)
GLUCOSE: 364 mg/dL — AB (ref 70–99)
POTASSIUM: 2.9 meq/L — AB (ref 3.5–5.1)
SODIUM: 132 meq/L — AB (ref 135–145)

## 2015-04-30 ENCOUNTER — Other Ambulatory Visit: Payer: Self-pay | Admitting: Internal Medicine

## 2015-05-02 ENCOUNTER — Other Ambulatory Visit: Payer: Self-pay | Admitting: Internal Medicine

## 2015-05-02 ENCOUNTER — Encounter: Payer: Self-pay | Admitting: Internal Medicine

## 2015-05-02 MED ORDER — GLUCOSE BLOOD VI STRP: ORAL_STRIP | Status: AC

## 2015-05-02 NOTE — Telephone Encounter (Signed)
Sent to pharmacy 

## 2015-05-02 NOTE — Addendum Note (Signed)
Addended by: Karle Barr on: 05/02/2015 01:38 PM   Modules accepted: Orders

## 2015-05-02 NOTE — Telephone Encounter (Signed)
erx for test strips sent.

## 2015-05-03 MED ORDER — BLOOD GLUCOSE MONITOR KIT: PACK | Status: AC

## 2015-05-03 NOTE — Addendum Note (Signed)
Addended by: Aviva Signs M on: 05/03/2015 11:04 AM   Modules accepted: Orders

## 2015-05-04 ENCOUNTER — Telehealth: Payer: Self-pay | Admitting: Pulmonary Disease

## 2015-05-04 DIAGNOSIS — G4733 Obstructive sleep apnea (adult) (pediatric): Secondary | ICD-10-CM

## 2015-05-04 NOTE — Telephone Encounter (Signed)
Spoke with Maudie Mercury @ [facility] and she states that pt has called them requesting to have his settings changed to BiPAP ST. She states that per pt when he is hospitalized he is on BiPAP and does well and would like to continue those settings at home.  VS Please advise. Thank you.

## 2015-05-05 ENCOUNTER — Encounter: Payer: Self-pay | Admitting: Internal Medicine

## 2015-05-05 MED ORDER — METFORMIN HCL 500 MG PO TABS: 500.0000 mg | ORAL_TABLET | Freq: Two times a day (BID) | ORAL | Status: DC

## 2015-05-05 NOTE — Telephone Encounter (Signed)
Please send order for BiPAP ST with pressure 25/20 cm H2O and back up rate of 14 with heated humidity.  Please have download sent 2 weeks after getting new BiPAP machine.

## 2015-05-06 ENCOUNTER — Telehealth: Payer: Self-pay | Admitting: Internal Medicine

## 2015-05-06 ENCOUNTER — Telehealth: Payer: Self-pay | Admitting: Pulmonary Disease

## 2015-05-06 ENCOUNTER — Telehealth: Payer: Self-pay

## 2015-05-06 DIAGNOSIS — I110 Hypertensive heart disease with heart failure: Secondary | ICD-10-CM | POA: Diagnosis not present

## 2015-05-06 NOTE — Telephone Encounter (Signed)
i have walked back to dr Judi Cong desk to make sure he has seen this note routed earlier today
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
## 2015-05-06 NOTE — Telephone Encounter (Signed)
No msg needed.  Closing encounter.

## 2015-05-06 NOTE — Telephone Encounter (Signed)
MD is out of office today pls advise on call-a-nurse msg below...Johny Chess

## 2015-05-06 NOTE — Telephone Encounter (Signed)
[place] Day - Client [place] Call Center Patient Name: Dean Bowers DOB: 12-22-1970 Initial Comment Caller states husband's bs was 364, rx glucose meter. Now 414, not feeling great. Metformin called in, has questions. Nurse Assessment Nurse: Markus Daft, RN, Sherre Poot Date/Time (Eastern Time): 05/06/2015 11:12:14 AM Confirm and document reason for call. If symptomatic, describe symptoms. You must click the next button to save text entered. ---Caller states husband's blood sugar was 364 at office last Friday. He had been on insulin while in hospital, discharged 04/18/15. 10:45 am glucose was 414. Had breakfast at 8 am. Fasting glucose 279. He is not feeling great. He feels tired, and BP 164/83 this AM. Metformin called in yesterday, and he has not had it yet. He was not sent home with insulin. In the hospital his blood sugar was never this elevated. - He feels about the same as he did this AM. Has the patient traveled out of the country within the last 30 days? ---Not Applicable Does the patient have any new or worsening symptoms? ---No Please document clinical information provided and list any resource used. ---Caller not with patient now, but feels that he needs something and they have not heard from the office. Please call. Guidelines Guideline Title Affirmed Question Affirmed Notes Final Disposition User Clinical Call [place], RN, [employer]

## 2015-05-06 NOTE — Telephone Encounter (Signed)
Order has been placed per VS. Nothing further was needed. 

## 2015-05-06 NOTE — Telephone Encounter (Signed)
Home Health Cert/Plan of Care received (04/19/2015 - 06/17/2015) and placed on MD's desk for signature

## 2015-05-09 ENCOUNTER — Telehealth: Payer: Self-pay | Admitting: Pulmonary Disease

## 2015-05-09 NOTE — Telephone Encounter (Signed)
lmtcb X1 for Cindy with [employer].

## 2015-05-10 ENCOUNTER — Ambulatory Visit: Payer: 59 | Admitting: Cardiology

## 2015-05-10 ENCOUNTER — Ambulatory Visit (INDEPENDENT_AMBULATORY_CARE_PROVIDER_SITE_OTHER): Payer: 59 | Admitting: Internal Medicine

## 2015-05-10 ENCOUNTER — Encounter: Payer: Self-pay | Admitting: Internal Medicine

## 2015-05-10 VITALS — BP 130/80 | HR 116 | Temp 98.3°F | Resp 20 | Wt 388.0 lb

## 2015-05-10 DIAGNOSIS — E119 Type 2 diabetes mellitus without complications: Secondary | ICD-10-CM | POA: Diagnosis not present

## 2015-05-10 DIAGNOSIS — I10 Essential (primary) hypertension: Secondary | ICD-10-CM | POA: Diagnosis not present

## 2015-05-10 DIAGNOSIS — E1169 Type 2 diabetes mellitus with other specified complication: Secondary | ICD-10-CM

## 2015-05-10 DIAGNOSIS — E785 Hyperlipidemia, unspecified: Secondary | ICD-10-CM | POA: Diagnosis not present

## 2015-05-10 MED ORDER — METFORMIN HCL 1000 MG PO TABS: 1000.0000 mg | ORAL_TABLET | Freq: Two times a day (BID) | ORAL | Status: AC

## 2015-05-10 MED ORDER — ROSUVASTATIN CALCIUM 20 MG PO TABS: 20.0000 mg | ORAL_TABLET | Freq: Every day | ORAL | Status: AC
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
## 2015-05-10 NOTE — Telephone Encounter (Signed)
LM x 2 for Cindy with [facility]

## 2015-05-10 NOTE — Progress Notes (Signed)
Pre visit review using our clinic review tool, if applicable. No additional management support is needed unless otherwise documented below in the visit note. 

## 2015-05-10 NOTE — Patient Instructions (Signed)
OK to increase the metformin to 1000 mg twice per day  Please take all new medication as prescribed  - the generic for crestor at 20 mg per day  Please continue all other medications as before, and refills have been done if requested.  Please have the pharmacy call with any other refills you may need.  Please keep your appointments with your specialists as you may have planned, as well as your PCP later this month

## 2015-05-11 ENCOUNTER — Encounter: Payer: Self-pay | Admitting: Internal Medicine

## 2015-05-11 NOTE — Telephone Encounter (Signed)
LMTCB

## 2015-05-12 ENCOUNTER — Telehealth: Payer: Self-pay

## 2015-05-12 ENCOUNTER — Encounter: Payer: Self-pay | Admitting: Internal Medicine

## 2015-05-12 NOTE — Assessment & Plan Note (Addendum)
stable overall by history and exam, recent data reviewed with pt, and pt to continue medical treatment as before,  to f/u any worsening symptoms or concerns Lab Results  Component Value Date   LDLCALC 82 05/27/2013   Goal ldl < 70, for crestor 20 qd

## 2015-05-12 NOTE — Assessment & Plan Note (Signed)
stable overall by history and exam, recent data reviewed with pt, and pt to continue medical treatment as before,  to f/u any worsening symptoms or concerns BP Readings from Last 3 Encounters:  05/10/15 130/80  04/26/15 130/82  04/22/15 128/84

## 2015-05-12 NOTE — Telephone Encounter (Signed)
Home Health Cert/Plan of Care received (04/19/2015 - 06/17/2015) and placed on MD's desk for signature

## 2015-05-12 NOTE — Assessment & Plan Note (Signed)
Uncontrolled, for metformin 1000 bid , o/w stable overall by history and exam, recent data reviewed with pt, and pt to continue medical treatment as before,  to f/u any worsening symptoms or concerns

## 2015-05-12 NOTE — Telephone Encounter (Signed)
Pt called to check up on this request, he is out of this med and really need this.

## 2015-05-12 NOTE — Telephone Encounter (Signed)
atc number listed below X2, a recording states that nobody is available at this time and to call back later.  Wcb.

## 2015-05-13 ENCOUNTER — Telehealth: Payer: Self-pay | Admitting: Geriatric Medicine

## 2015-05-13 MED ORDER — OXYCODONE HCL 5 MG PO TABS: 5.0000 mg | ORAL_TABLET | Freq: Two times a day (BID) | ORAL | Status: DC | PRN

## 2015-05-13 NOTE — Telephone Encounter (Signed)
Pt called to check up on this request, he is completely out and really need this before Dr. Sharlet Salina comes back. He was hopping to get it today. Please help

## 2015-05-13 NOTE — Telephone Encounter (Signed)
Printed and placed up front in cabinet. Patient aware.

## 2015-05-13 NOTE — Telephone Encounter (Signed)
Requesting refill of his oxycodone, please advise.

## 2015-05-13 NOTE — Telephone Encounter (Signed)
Medication refilled

## 2015-05-13 NOTE — Telephone Encounter (Signed)
Spoke with Jenny Reichmann with [facility].  Was advised this has already been taken care of by RT and nothing further needed at this time.  Will sign off.

## 2015-05-17 ENCOUNTER — Encounter (HOSPITAL_COMMUNITY): Payer: Self-pay | Admitting: Internal Medicine

## 2015-05-17 ENCOUNTER — Ambulatory Visit (HOSPITAL_COMMUNITY)
Admission: RE | Admit: 2015-05-17 | Discharge: 2015-05-17 | Disposition: A | Discharge status: Home / Self Care | Payer: Medicare Other | Source: Ambulatory Visit | Attending: Internal Medicine | Admitting: Internal Medicine

## 2015-05-17 ENCOUNTER — Telehealth (HOSPITAL_COMMUNITY): Payer: Self-pay | Admitting: *Deleted

## 2015-05-17 VITALS — BP 146/70 | HR 112 | Wt 384.8 lb

## 2015-05-17 DIAGNOSIS — E876 Hypokalemia: Secondary | ICD-10-CM

## 2015-05-17 DIAGNOSIS — F419 Anxiety disorder, unspecified: Secondary | ICD-10-CM | POA: Insufficient documentation

## 2015-05-17 DIAGNOSIS — J449 Chronic obstructive pulmonary disease, unspecified: Secondary | ICD-10-CM | POA: Diagnosis not present

## 2015-05-17 DIAGNOSIS — E785 Hyperlipidemia, unspecified: Secondary | ICD-10-CM | POA: Diagnosis not present

## 2015-05-17 DIAGNOSIS — Z8249 Family history of ischemic heart disease and other diseases of the circulatory system: Secondary | ICD-10-CM | POA: Diagnosis not present

## 2015-05-17 DIAGNOSIS — Z7982 Long term (current) use of aspirin: Secondary | ICD-10-CM | POA: Insufficient documentation

## 2015-05-17 DIAGNOSIS — F17210 Nicotine dependence, cigarettes, uncomplicated: Secondary | ICD-10-CM | POA: Diagnosis not present

## 2015-05-17 DIAGNOSIS — Z833 Family history of diabetes mellitus: Secondary | ICD-10-CM | POA: Insufficient documentation

## 2015-05-17 DIAGNOSIS — Z86711 Personal history of pulmonary embolism: Secondary | ICD-10-CM | POA: Insufficient documentation

## 2015-05-17 DIAGNOSIS — E119 Type 2 diabetes mellitus without complications: Secondary | ICD-10-CM | POA: Insufficient documentation

## 2015-05-17 DIAGNOSIS — Z7901 Long term (current) use of anticoagulants: Secondary | ICD-10-CM | POA: Insufficient documentation

## 2015-05-17 DIAGNOSIS — Z888 Allergy status to other drugs, medicaments and biological substances status: Secondary | ICD-10-CM | POA: Insufficient documentation

## 2015-05-17 DIAGNOSIS — Z86718 Personal history of other venous thrombosis and embolism: Secondary | ICD-10-CM | POA: Diagnosis not present

## 2015-05-17 DIAGNOSIS — Z89511 Acquired absence of right leg below knee: Secondary | ICD-10-CM | POA: Insufficient documentation

## 2015-05-17 DIAGNOSIS — I5032 Chronic diastolic (congestive) heart failure: Secondary | ICD-10-CM | POA: Diagnosis not present

## 2015-05-17 DIAGNOSIS — I5042 Chronic combined systolic (congestive) and diastolic (congestive) heart failure: Secondary | ICD-10-CM | POA: Diagnosis present

## 2015-05-17 DIAGNOSIS — E662 Morbid (severe) obesity with alveolar hypoventilation: Secondary | ICD-10-CM | POA: Insufficient documentation

## 2015-05-17 DIAGNOSIS — Z79899 Other long term (current) drug therapy: Secondary | ICD-10-CM | POA: Diagnosis not present

## 2015-05-17 DIAGNOSIS — Z7984 Long term (current) use of oral hypoglycemic drugs: Secondary | ICD-10-CM | POA: Diagnosis not present

## 2015-05-17 DIAGNOSIS — Z841 Family history of disorders of kidney and ureter: Secondary | ICD-10-CM | POA: Diagnosis not present

## 2015-05-17 DIAGNOSIS — I739 Peripheral vascular disease, unspecified: Secondary | ICD-10-CM | POA: Diagnosis not present

## 2015-05-17 LAB — BASIC METABOLIC PANEL
ANION GAP: 17 — AB (ref 5–15)
BUN: 25 mg/dL — ABNORMAL HIGH (ref 6–20)
CO2: 35 mmol/L — ABNORMAL HIGH (ref 22–32)
Calcium: 9.9 mg/dL (ref 8.9–10.3)
Chloride: 83 mmol/L — ABNORMAL LOW (ref 101–111)
Creatinine, Ser: 1.06 mg/dL (ref 0.61–1.24)
Glucose, Bld: 158 mg/dL — ABNORMAL HIGH (ref 65–99)
POTASSIUM: 2.6 mmol/L — AB (ref 3.5–5.1)
Sodium: 135 mmol/L (ref 135–145)

## 2015-05-17 MED ORDER — TORSEMIDE 20 MG PO TABS: 40.0000 mg | ORAL_TABLET | Freq: Two times a day (BID) | ORAL | Status: DC

## 2015-05-17 MED ORDER — SPIRONOLACTONE 25 MG PO TABS: 25.0000 mg | ORAL_TABLET | Freq: Every day | ORAL | Status: AC
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
## 2015-05-17 NOTE — Telephone Encounter (Signed)
K 2.6:  Notes Recorded by Scarlette Calico, RN on 05/17/2015 at 5:04 PM Per Dr Haroldine Laws, have pt take extra 160 meq of KCL tonight, repeat labs [place]. Pt aware and agreeable, he will split up KCL so not to take it all at once, labs sch for [facility]

## 2015-05-17 NOTE — Progress Notes (Signed)
Advanced Heart Failure Medication Review by a Pharmacist  Does the patient  feel that his/her medications are working for him/her?  yes  Has the patient been experiencing any side effects to the medications prescribed?  no  Does the patient measure his/her own blood pressure or blood glucose at home?  yes   Does the patient have any problems obtaining medications due to transportation or finances?   no  Understanding of regimen: good Understanding of indications: good Potential of compliance: good Patient understands to avoid NSAIDs. Patient understands to avoid decongestants.   Pharmacist comments: Reports taking all morning doses prior to visit today. Denies adverse effects or missed doses. Uses bipap at night and denies SOB, PND or having to sleep with head of bed elevated. Complains of fatigue but feels this is more related to use of prosthetic.Tracks weight at home and is currently taking metolazone 2.5mg  MWF and torsemide 20mg  BID. Reports using higher doses of torsemide previously but was not able to effectively diurese w/o use of metolazone. Reports limiting NaCl intake but having difficulty limiting fluids (still under 64 oz/day however). All medication questions addressed.    Time with patient: 15 min Preparation and documentation time: 5 min Total time: 20 min  Stephens November, PharmD Clinical Pharmacy Resident 3:15 PM, 05/17/2015

## 2015-05-17 NOTE — Patient Instructions (Signed)
Increase Torsemide to 40 mg (2 tabs) Twice a day  Start Spironolactone 25 mg daily  Labs today and in 1 week  Your physician recommends that you schedule a follow-up appointment in: 4-6 weeks

## 2015-05-19 ENCOUNTER — Encounter (HOSPITAL_COMMUNITY): Payer: Self-pay

## 2015-05-19 ENCOUNTER — Ambulatory Visit (HOSPITAL_COMMUNITY)
Admission: RE | Admit: 2015-05-19 | Discharge: 2015-05-19 | Disposition: A | Discharge status: Home / Self Care | Payer: 59 | Source: Ambulatory Visit | Attending: Internal Medicine | Admitting: Internal Medicine

## 2015-05-19 DIAGNOSIS — I5032 Chronic diastolic (congestive) heart failure: Secondary | ICD-10-CM | POA: Diagnosis not present

## 2015-05-19 LAB — BASIC METABOLIC PANEL
ANION GAP: 21 — AB (ref 5–15)
BUN: 30 mg/dL — ABNORMAL HIGH (ref 6–20)
CALCIUM: 10.4 mg/dL — AB (ref 8.9–10.3)
CO2: 29 mmol/L (ref 22–32)
Chloride: 84 mmol/L — ABNORMAL LOW (ref 101–111)
Creatinine, Ser: 1.5 mg/dL — ABNORMAL HIGH (ref 0.61–1.24)
GFR calc Af Amer: >60 mL/min (ref >60)
GFR, EST NON AFRICAN AMERICAN: 55 mL/min — AB (ref >60)
GLUCOSE: 145 mg/dL — AB (ref 65–99)
Potassium: 2.8 mmol/L — ABNORMAL LOW (ref 3.5–5.1)
SODIUM: 134 mmol/L — AB (ref 135–145)

## 2015-05-20 ENCOUNTER — Encounter (HOSPITAL_BASED_OUTPATIENT_CLINIC_OR_DEPARTMENT_OTHER): Payer: Medicare Other

## 2015-05-24 ENCOUNTER — Ambulatory Visit (HOSPITAL_COMMUNITY)
Admission: RE | Admit: 2015-05-24 | Discharge: 2015-05-24 | Disposition: A | Discharge status: Home / Self Care | Payer: 59 | Source: Ambulatory Visit | Attending: Cardiology | Admitting: Cardiology

## 2015-05-24 ENCOUNTER — Encounter: Payer: Self-pay | Admitting: Internal Medicine

## 2015-05-24 ENCOUNTER — Ambulatory Visit (INDEPENDENT_AMBULATORY_CARE_PROVIDER_SITE_OTHER): Payer: 59 | Admitting: Internal Medicine

## 2015-05-24 VITALS — BP 138/62 | HR 114 | Temp 98.3°F | Resp 18 | Ht 70.0 in | Wt 382.0 lb

## 2015-05-24 DIAGNOSIS — E876 Hypokalemia: Secondary | ICD-10-CM | POA: Diagnosis not present

## 2015-05-24 DIAGNOSIS — E119 Type 2 diabetes mellitus without complications: Secondary | ICD-10-CM | POA: Diagnosis not present

## 2015-05-24 DIAGNOSIS — J36 Peritonsillar abscess: Secondary | ICD-10-CM | POA: Diagnosis not present

## 2015-05-24 MED ORDER — CANAGLIFLOZIN 100 MG PO TABS: 100.0000 mg | ORAL_TABLET | Freq: Every day | ORAL | Status: AC

## 2015-05-24 MED ORDER — OXYCODONE HCL 5 MG PO TABS: 5.0000 mg | ORAL_TABLET | Freq: Two times a day (BID) | ORAL | Status: DC | PRN

## 2015-05-24 NOTE — Progress Notes (Signed)
Pre visit review using our clinic review tool, if applicable. No additional management support is needed unless otherwise documented below in the visit note. 

## 2015-05-24 NOTE — Patient Instructions (Signed)
We are adding a medicine called invokana for the sugars. Take 1 pill daily. If you are sick or get dehydrated (cannot drink water, diarrhea) do not take this medicine and wait until you feel well to resume.   We would like to check the Hga1c in about 2-3 months to see how the sugars are doing. Call us if you are still having high sugars or low sugars with this new medicine.

## 2015-05-25 NOTE — Progress Notes (Signed)
   Subjective:    Patient ID: Dean Bowers, male    DOB: [birth year], 45 y.o.   MRN: [id number]  HPI The patient is a 45 YO man coming in for follow up of his sugars. He is taking metformin 1000 mg BID without side effects. Morning sugars still mostly in the 200s and some as low as 160. Go up fast with eating and food. He is working on diet and has eliminated about 50% of carbs and only drinking water now and no sodas.   Review of Systems  Constitutional: Positive for activity change and fatigue. Negative for fever, chills, appetite change and unexpected weight change.  Respiratory: Negative for cough, chest tightness, shortness of breath and wheezing.   Cardiovascular: Negative for chest pain and palpitations.  Gastrointestinal: Negative for nausea, abdominal pain, diarrhea, constipation and abdominal distention.  Musculoskeletal: Positive for back pain, arthralgias and gait problem. Negative for myalgias.  Skin:       tattoos  Neurological: Negative.       Objective:   Physical Exam  Constitutional: He appears well-developed and well-nourished.  Obese, with many tattoos  HENT:  Head: Normocephalic and atraumatic.  Eyes: EOM are normal.  Neck: Normal range of motion.  Cardiovascular: Normal rate and regular rhythm.   Pulmonary/Chest: Effort normal and breath sounds normal. No respiratory distress. He has no wheezes. He has no rales. He exhibits no tenderness.  Abdominal: Soft. Bowel sounds are normal. He exhibits no distension. There is no tenderness. There is no rebound.  Musculoskeletal:  Right leg amputated below the knee with prosthesis in place.  Neurological: Coordination normal.  Skin: Skin is warm and dry.  Psychiatric: He has a normal mood and affect.   Filed Vitals:   05/24/15 1522  BP: 138/62  Pulse: 114  Temp: 98.3 F (36.8 C)  TempSrc: Oral  Resp: 18  Height: 5\' 10"  (1.778 m)  Weight: 382 lb (173.274 kg)  SpO2: 93%      Assessment & Plan:

## 2015-05-25 NOTE — Assessment & Plan Note (Signed)
It is unclear what has caused his sugars to elevate recently. Last HgA1c in January was 6.5. He is taking metformin 1000 mg BID and will start invokana for increased control of post-prandial sugars. Checking HgA1c in about 2-3 months after therapy in place. Has meter and is checking his sugars at home.

## 2015-05-25 NOTE — Assessment & Plan Note (Signed)
Resolved and on exam not present. Will resolve problem.

## 2015-05-27 ENCOUNTER — Ambulatory Visit (HOSPITAL_COMMUNITY)
Admission: RE | Admit: 2015-05-27 | Discharge: 2015-05-27 | Disposition: A | Discharge status: Home / Self Care | Payer: Medicare Other | Source: Ambulatory Visit | Attending: Internal Medicine | Admitting: Internal Medicine

## 2015-05-27 DIAGNOSIS — I5032 Chronic diastolic (congestive) heart failure: Secondary | ICD-10-CM

## 2015-05-27 LAB — BASIC METABOLIC PANEL
Anion gap: 16 — ABNORMAL HIGH (ref 5–15)
BUN: 22 mg/dL — AB (ref 6–20)
CHLORIDE: 87 mmol/L — AB (ref 101–111)
CO2: 32 mmol/L (ref 22–32)
Calcium: 10.2 mg/dL (ref 8.9–10.3)
Creatinine, Ser: 1.09 mg/dL (ref 0.61–1.24)
GFR calc Af Amer: >60 mL/min (ref >60)
GFR calc non Af Amer: >60 mL/min (ref >60)
Glucose, Bld: 167 mg/dL — ABNORMAL HIGH (ref 65–99)
POTASSIUM: 3.2 mmol/L — AB (ref 3.5–5.1)
SODIUM: 135 mmol/L (ref 135–145)

## 2015-05-30 ENCOUNTER — Encounter: Payer: Self-pay | Admitting: Internal Medicine

## 2015-05-31 ENCOUNTER — Other Ambulatory Visit: Payer: Self-pay | Admitting: Internal Medicine

## 2015-06-02 ENCOUNTER — Other Ambulatory Visit: Payer: Self-pay | Admitting: Internal Medicine

## 2015-06-02 MED ORDER — DILTIAZEM HCL ER COATED BEADS 120 MG PO CP24: 120.0000 mg | ORAL_CAPSULE | Freq: Every day | ORAL | Status: AC

## 2015-06-20 ENCOUNTER — Encounter: Payer: Self-pay | Admitting: Nurse Practitioner

## 2015-06-20 ENCOUNTER — Encounter: Payer: Self-pay | Admitting: Internal Medicine
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
## 2015-06-21 ENCOUNTER — Telehealth (HOSPITAL_COMMUNITY): Payer: Self-pay | Admitting: Cardiology

## 2015-06-21 ENCOUNTER — Ambulatory Visit (HOSPITAL_COMMUNITY)
Admission: RE | Admit: 2015-06-21 | Discharge: 2015-06-21 | Disposition: A | Discharge status: Home / Self Care | Payer: Medicare Other | Source: Ambulatory Visit | Attending: Cardiology | Admitting: Cardiology

## 2015-06-21 ENCOUNTER — Encounter (HOSPITAL_COMMUNITY): Payer: Self-pay

## 2015-06-21 VITALS — BP 124/74 | HR 117 | Wt 376.8 lb

## 2015-06-21 DIAGNOSIS — Z89511 Acquired absence of right leg below knee: Secondary | ICD-10-CM | POA: Diagnosis not present

## 2015-06-21 DIAGNOSIS — I5032 Chronic diastolic (congestive) heart failure: Secondary | ICD-10-CM

## 2015-06-21 DIAGNOSIS — Z7901 Long term (current) use of anticoagulants: Secondary | ICD-10-CM | POA: Diagnosis not present

## 2015-06-21 DIAGNOSIS — E119 Type 2 diabetes mellitus without complications: Secondary | ICD-10-CM | POA: Diagnosis not present

## 2015-06-21 DIAGNOSIS — Z825 Family history of asthma and other chronic lower respiratory diseases: Secondary | ICD-10-CM | POA: Insufficient documentation

## 2015-06-21 DIAGNOSIS — Z86711 Personal history of pulmonary embolism: Secondary | ICD-10-CM | POA: Insufficient documentation

## 2015-06-21 DIAGNOSIS — I509 Heart failure, unspecified: Secondary | ICD-10-CM

## 2015-06-21 DIAGNOSIS — E785 Hyperlipidemia, unspecified: Secondary | ICD-10-CM | POA: Diagnosis not present

## 2015-06-21 DIAGNOSIS — Z7984 Long term (current) use of oral hypoglycemic drugs: Secondary | ICD-10-CM | POA: Insufficient documentation

## 2015-06-21 DIAGNOSIS — E662 Morbid (severe) obesity with alveolar hypoventilation: Secondary | ICD-10-CM | POA: Diagnosis not present

## 2015-06-21 DIAGNOSIS — Z7982 Long term (current) use of aspirin: Secondary | ICD-10-CM | POA: Insufficient documentation

## 2015-06-21 DIAGNOSIS — I739 Peripheral vascular disease, unspecified: Secondary | ICD-10-CM | POA: Diagnosis not present

## 2015-06-21 DIAGNOSIS — Z79899 Other long term (current) drug therapy: Secondary | ICD-10-CM | POA: Insufficient documentation

## 2015-06-21 DIAGNOSIS — J449 Chronic obstructive pulmonary disease, unspecified: Secondary | ICD-10-CM | POA: Diagnosis not present

## 2015-06-21 DIAGNOSIS — Z720 Tobacco use: Secondary | ICD-10-CM

## 2015-06-21 DIAGNOSIS — I110 Hypertensive heart disease with heart failure: Secondary | ICD-10-CM | POA: Diagnosis not present

## 2015-06-21 DIAGNOSIS — I5042 Chronic combined systolic (congestive) and diastolic (congestive) heart failure: Secondary | ICD-10-CM | POA: Diagnosis not present

## 2015-06-21 DIAGNOSIS — Z833 Family history of diabetes mellitus: Secondary | ICD-10-CM | POA: Insufficient documentation

## 2015-06-21 DIAGNOSIS — F419 Anxiety disorder, unspecified: Secondary | ICD-10-CM | POA: Insufficient documentation

## 2015-06-21 DIAGNOSIS — G4730 Sleep apnea, unspecified: Secondary | ICD-10-CM | POA: Diagnosis not present

## 2015-06-21 DIAGNOSIS — Z8249 Family history of ischemic heart disease and other diseases of the circulatory system: Secondary | ICD-10-CM | POA: Diagnosis not present

## 2015-06-21 DIAGNOSIS — E876 Hypokalemia: Secondary | ICD-10-CM | POA: Insufficient documentation

## 2015-06-21 DIAGNOSIS — F17200 Nicotine dependence, unspecified, uncomplicated: Secondary | ICD-10-CM

## 2015-06-21 DIAGNOSIS — Z841 Family history of disorders of kidney and ureter: Secondary | ICD-10-CM | POA: Diagnosis not present

## 2015-06-21 DIAGNOSIS — F17210 Nicotine dependence, cigarettes, uncomplicated: Secondary | ICD-10-CM | POA: Insufficient documentation

## 2015-06-21 LAB — BASIC METABOLIC PANEL
ANION GAP: 15 (ref 5–15)
BUN: 23 mg/dL — AB (ref 6–20)
CHLORIDE: 82 mmol/L — AB (ref 101–111)
CO2: 35 mmol/L — ABNORMAL HIGH (ref 22–32)
Calcium: 10.1 mg/dL (ref 8.9–10.3)
Creatinine, Ser: 1.48 mg/dL — ABNORMAL HIGH (ref 0.61–1.24)
GFR calc Af Amer: >60 mL/min (ref >60)
GFR, EST NON AFRICAN AMERICAN: 56 mL/min — AB (ref >60)
Glucose, Bld: 201 mg/dL — ABNORMAL HIGH (ref 65–99)
POTASSIUM: 2.6 mmol/L — AB (ref 3.5–5.1)
SODIUM: 132 mmol/L — AB (ref 135–145)

## 2015-06-21 MED ORDER — OXYCODONE HCL 5 MG PO TABS: 5.0000 mg | ORAL_TABLET | Freq: Two times a day (BID) | ORAL | Status: DC | PRN

## 2015-06-21 NOTE — Telephone Encounter (Signed)
Abnormal labs k 2.6  per vo Oda Kilts, PA Take 60 meq now, 60 MEQ BID 06/22/15 and then increase to 40 BID thereafter return for repeat labs 5/19  Patient aware and voiced understanding

## 2015-06-21 NOTE — Patient Instructions (Signed)
Labs today  Your physician recommends that you schedule a follow-up appointment in: 4 weeks with Rebecca Eaton  Do the following things EVERYDAY: 1) Weigh yourself in the morning before breakfast. Write it down and keep it in a log. 2) Take your medicines as prescribed 3) Eat low salt foods-Limit salt (sodium) to 2000 mg per day.  4) Stay as active as you can everyday 5) Limit all fluids for the day to less than 2 liters 6)

## 2015-06-24 ENCOUNTER — Ambulatory Visit (HOSPITAL_COMMUNITY)
Admission: RE | Admit: 2015-06-24 | Discharge: 2015-06-24 | Disposition: A | Discharge status: Home / Self Care | Payer: Medicare Other | Source: Ambulatory Visit | Attending: Internal Medicine | Admitting: Internal Medicine

## 2015-06-24 DIAGNOSIS — I5032 Chronic diastolic (congestive) heart failure: Secondary | ICD-10-CM

## 2015-06-24 LAB — BASIC METABOLIC PANEL
Anion gap: 17 — ABNORMAL HIGH (ref 5–15)
BUN: 17 mg/dL (ref 6–20)
CHLORIDE: 90 mmol/L — AB (ref 101–111)
CO2: 29 mmol/L (ref 22–32)
CREATININE: 1.27 mg/dL — AB (ref 0.61–1.24)
Calcium: 9.5 mg/dL (ref 8.9–10.3)
GFR calc Af Amer: >60 mL/min (ref >60)
GFR calc non Af Amer: >60 mL/min (ref >60)
GLUCOSE: 126 mg/dL — AB (ref 65–99)
Potassium: 3 mmol/L — ABNORMAL LOW (ref 3.5–5.1)
Sodium: 136 mmol/L (ref 135–145)

## 2015-06-27 ENCOUNTER — Telehealth (HOSPITAL_COMMUNITY): Payer: Self-pay | Admitting: *Deleted

## 2015-06-27 DIAGNOSIS — I5032 Chronic diastolic (congestive) heart failure: Secondary | ICD-10-CM

## 2015-06-27 MED ORDER — POTASSIUM CHLORIDE CRYS ER 20 MEQ PO TBCR: 60.0000 meq | EXTENDED_RELEASE_TABLET | Freq: Two times a day (BID) | ORAL | Status: DC

## 2015-06-27 NOTE — Telephone Encounter (Signed)
Notes Recorded by Scarlette Calico, RN on 06/27/2015 at 4:51 PM Per Oda Kilts, PA increase KCL to 60 meq Twice daily, pt aware and agreeable, repeat labs Fri 5/26

## 2015-07-01 ENCOUNTER — Other Ambulatory Visit: Payer: Self-pay | Admitting: Internal Medicine

## 2015-07-01 ENCOUNTER — Ambulatory Visit (HOSPITAL_COMMUNITY)
Admission: RE | Admit: 2015-07-01 | Discharge: 2015-07-01 | Disposition: A | Discharge status: Home / Self Care | Payer: Medicare Other | Source: Ambulatory Visit | Attending: Cardiology | Admitting: Cardiology

## 2015-07-01 DIAGNOSIS — I5032 Chronic diastolic (congestive) heart failure: Secondary | ICD-10-CM | POA: Diagnosis present

## 2015-07-01 LAB — BASIC METABOLIC PANEL
Anion gap: 16 — ABNORMAL HIGH (ref 5–15)
BUN: 24 mg/dL — ABNORMAL HIGH (ref 6–20)
CHLORIDE: 85 mmol/L — AB (ref 101–111)
CO2: 34 mmol/L — AB (ref 22–32)
CREATININE: 1.27 mg/dL — AB (ref 0.61–1.24)
Calcium: 9.5 mg/dL (ref 8.9–10.3)
GFR calc non Af Amer: >60 mL/min (ref >60)
Glucose, Bld: 217 mg/dL — ABNORMAL HIGH (ref 65–99)
POTASSIUM: 3.5 mmol/L (ref 3.5–5.1)
Sodium: 135 mmol/L (ref 135–145)

## 2015-07-08 ENCOUNTER — Other Ambulatory Visit (HOSPITAL_COMMUNITY): Payer: Medicare Other

## 2015-07-11 ENCOUNTER — Encounter: Payer: Self-pay | Admitting: Internal Medicine

## 2015-07-14 ENCOUNTER — Encounter: Payer: Self-pay | Admitting: Internal Medicine

## 2015-07-14 ENCOUNTER — Other Ambulatory Visit: Payer: Self-pay | Admitting: Internal Medicine

## 2015-07-14 ENCOUNTER — Ambulatory Visit: Payer: PRIVATE HEALTH INSURANCE | Admitting: Podiatry

## 2015-07-14 MED ORDER — DICLOFENAC SODIUM 75 MG PO TBEC: 75.0000 mg | DELAYED_RELEASE_TABLET | Freq: Two times a day (BID) | ORAL | Status: AC

## 2015-07-14 NOTE — Telephone Encounter (Signed)
Faxed to pharmacy

## 2015-07-19 ENCOUNTER — Other Ambulatory Visit: Payer: Self-pay | Admitting: Geriatric Medicine

## 2015-07-19 ENCOUNTER — Encounter: Payer: Self-pay | Admitting: Internal Medicine

## 2015-07-19 ENCOUNTER — Telehealth: Payer: Self-pay | Admitting: Pulmonary Disease

## 2015-07-19 ENCOUNTER — Other Ambulatory Visit: Payer: Self-pay | Admitting: Family Medicine

## 2015-07-19 DIAGNOSIS — G4733 Obstructive sleep apnea (adult) (pediatric): Secondary | ICD-10-CM

## 2015-07-19 DIAGNOSIS — E662 Morbid (severe) obesity with alveolar hypoventilation: Secondary | ICD-10-CM

## 2015-07-19 DIAGNOSIS — J9611 Chronic respiratory failure with hypoxia: Secondary | ICD-10-CM

## 2015-07-19 DIAGNOSIS — J9612 Chronic respiratory failure with hypercapnia: Secondary | ICD-10-CM

## 2015-07-19 MED ORDER — ALBUTEROL SULFATE HFA 108 (90 BASE) MCG/ACT IN AERS: INHALATION_SPRAY | RESPIRATORY_TRACT | Status: AC
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
## 2015-07-19 NOTE — Telephone Encounter (Signed)
Received message from [place] >> pt wants IPAP increased to 30 cm H2O.  I have sent order for this.

## 2015-07-19 NOTE — Telephone Encounter (Signed)
Is this okay looks like pt has changed Dr. He has changed and no longer see's [place] per [employer]

## 2015-07-20 ENCOUNTER — Ambulatory Visit (HOSPITAL_COMMUNITY)
Admission: RE | Admit: 2015-07-20 | Discharge: 2015-07-20 | Disposition: A | Discharge status: Home / Self Care | Payer: Medicare Other | Source: Ambulatory Visit | Attending: Internal Medicine | Admitting: Internal Medicine

## 2015-07-20 VITALS — BP 130/68 | HR 118 | Wt 395.6 lb

## 2015-07-20 DIAGNOSIS — Z7901 Long term (current) use of anticoagulants: Secondary | ICD-10-CM | POA: Diagnosis not present

## 2015-07-20 DIAGNOSIS — Z841 Family history of disorders of kidney and ureter: Secondary | ICD-10-CM | POA: Insufficient documentation

## 2015-07-20 DIAGNOSIS — I5033 Acute on chronic diastolic (congestive) heart failure: Secondary | ICD-10-CM

## 2015-07-20 DIAGNOSIS — F419 Anxiety disorder, unspecified: Secondary | ICD-10-CM | POA: Insufficient documentation

## 2015-07-20 DIAGNOSIS — E662 Morbid (severe) obesity with alveolar hypoventilation: Secondary | ICD-10-CM | POA: Diagnosis not present

## 2015-07-20 DIAGNOSIS — Z888 Allergy status to other drugs, medicaments and biological substances status: Secondary | ICD-10-CM | POA: Insufficient documentation

## 2015-07-20 DIAGNOSIS — I5032 Chronic diastolic (congestive) heart failure: Secondary | ICD-10-CM

## 2015-07-20 DIAGNOSIS — E785 Hyperlipidemia, unspecified: Secondary | ICD-10-CM | POA: Diagnosis not present

## 2015-07-20 DIAGNOSIS — Z7984 Long term (current) use of oral hypoglycemic drugs: Secondary | ICD-10-CM | POA: Diagnosis not present

## 2015-07-20 DIAGNOSIS — Z825 Family history of asthma and other chronic lower respiratory diseases: Secondary | ICD-10-CM | POA: Insufficient documentation

## 2015-07-20 DIAGNOSIS — Z86711 Personal history of pulmonary embolism: Secondary | ICD-10-CM | POA: Diagnosis not present

## 2015-07-20 DIAGNOSIS — Z720 Tobacco use: Secondary | ICD-10-CM | POA: Diagnosis not present

## 2015-07-20 DIAGNOSIS — Z833 Family history of diabetes mellitus: Secondary | ICD-10-CM | POA: Insufficient documentation

## 2015-07-20 DIAGNOSIS — Z7982 Long term (current) use of aspirin: Secondary | ICD-10-CM | POA: Diagnosis not present

## 2015-07-20 DIAGNOSIS — Z79899 Other long term (current) drug therapy: Secondary | ICD-10-CM | POA: Insufficient documentation

## 2015-07-20 DIAGNOSIS — Z89511 Acquired absence of right leg below knee: Secondary | ICD-10-CM | POA: Insufficient documentation

## 2015-07-20 DIAGNOSIS — E119 Type 2 diabetes mellitus without complications: Secondary | ICD-10-CM | POA: Diagnosis not present

## 2015-07-20 DIAGNOSIS — J449 Chronic obstructive pulmonary disease, unspecified: Secondary | ICD-10-CM | POA: Diagnosis not present

## 2015-07-20 DIAGNOSIS — F17210 Nicotine dependence, cigarettes, uncomplicated: Secondary | ICD-10-CM | POA: Insufficient documentation

## 2015-07-20 DIAGNOSIS — I110 Hypertensive heart disease with heart failure: Secondary | ICD-10-CM | POA: Insufficient documentation

## 2015-07-20 DIAGNOSIS — G4730 Sleep apnea, unspecified: Secondary | ICD-10-CM | POA: Insufficient documentation

## 2015-07-20 DIAGNOSIS — I5042 Chronic combined systolic (congestive) and diastolic (congestive) heart failure: Secondary | ICD-10-CM | POA: Insufficient documentation

## 2015-07-20 DIAGNOSIS — E876 Hypokalemia: Secondary | ICD-10-CM

## 2015-07-20 DIAGNOSIS — Z8249 Family history of ischemic heart disease and other diseases of the circulatory system: Secondary | ICD-10-CM | POA: Insufficient documentation

## 2015-07-20 DIAGNOSIS — F17200 Nicotine dependence, unspecified, uncomplicated: Secondary | ICD-10-CM

## 2015-07-20 LAB — BASIC METABOLIC PANEL
Anion gap: 15 (ref 5–15)
BUN: 23 mg/dL — AB (ref 6–20)
CALCIUM: 9.4 mg/dL (ref 8.9–10.3)
CO2: 32 mmol/L (ref 22–32)
CREATININE: 1.21 mg/dL (ref 0.61–1.24)
Chloride: 83 mmol/L — ABNORMAL LOW (ref 101–111)
GFR calc non Af Amer: >60 mL/min (ref >60)
Glucose, Bld: 399 mg/dL — ABNORMAL HIGH (ref 65–99)
Potassium: 3.3 mmol/L — ABNORMAL LOW (ref 3.5–5.1)
SODIUM: 130 mmol/L — AB (ref 135–145)

## 2015-07-20 LAB — BRAIN NATRIURETIC PEPTIDE: B NATRIURETIC PEPTIDE 5: 175.5 pg/mL — AB (ref 0.0–100.0)

## 2015-07-20 MED ORDER — TORSEMIDE 20 MG PO TABS: 60.0000 mg | ORAL_TABLET | Freq: Two times a day (BID) | ORAL | Status: AC

## 2015-07-20 MED ORDER — POTASSIUM CHLORIDE CRYS ER 20 MEQ PO TBCR
40.0000 meq | EXTENDED_RELEASE_TABLET | Freq: Once | ORAL | Status: AC
  Administered 2015-07-20: 40 meq via ORAL
  Filled 2015-07-20: qty 2

## 2015-07-20 MED ORDER — FUROSEMIDE 10 MG/ML IJ SOLN
80.0000 mg | Freq: Once | INTRAMUSCULAR | Status: AC
  Administered 2015-07-20: 80 mg via INTRAVENOUS
  Filled 2015-07-20: qty 8

## 2015-07-20 NOTE — Progress Notes (Signed)
Advanced Heart Failure Medication Review by a Pharmacist  Does the patient  feel that his/her medications are working for him/her?  yes  Has the patient been experiencing any side effects to the medications prescribed?  no  Does the patient measure his/her own blood pressure or blood glucose at home?  yes   Does the patient have any problems obtaining medications due to transportation or finances?   no  Understanding of regimen: good Understanding of indications: good Potential of compliance: good Patient understands to avoid NSAIDs. Patient understands to avoid decongestants.  Pharmacist comments: Dean Bowers is a pleasant 16 yom presenting to clinic for a follow-up. He states that he is not experiencing any medication-related side effects at this time. He has been prescribed diclofenac BID for back pain. Advised patient of the effects and potential harm of NSAIDs in patients with HF. Patient states that he is scheduled for an appointment with his PCP soon and will discuss this with them. Patient did not have any other medication-related questions at this time.   [name] C. Lennox Grumbles, PharmD Pharmacy Resident  Pager: [phone] 07/20/2015 3:05 PM   Time with patient: 15 min  Preparation and documentation time: 3 min  Total time: 18 min

## 2015-07-20 NOTE — Patient Instructions (Signed)
INCREASE Torsemide to 60 mg (3 tabs) twice a day  Your physician recommends that you schedule a follow-up appointment in: 2 weeks  Do the following things EVERYDAY: 1) Weigh yourself in the morning before breakfast. Write it down and keep it in a log. 2) Take your medicines as prescribed 3) Eat low salt foods-Limit salt (sodium) to 2000 mg per day.  4) Stay as active as you can everyday 5) Limit all fluids for the day to less than 2 liters 6)

## 2015-07-22 ENCOUNTER — Other Ambulatory Visit (INDEPENDENT_AMBULATORY_CARE_PROVIDER_SITE_OTHER): Payer: 59

## 2015-07-22 ENCOUNTER — Encounter: Payer: Self-pay | Admitting: Internal Medicine

## 2015-07-22 ENCOUNTER — Ambulatory Visit (INDEPENDENT_AMBULATORY_CARE_PROVIDER_SITE_OTHER): Payer: 59 | Admitting: Internal Medicine

## 2015-07-22 VITALS — BP 162/80 | HR 121 | Temp 98.6°F | Resp 20 | Ht 70.0 in | Wt 389.0 lb

## 2015-07-22 DIAGNOSIS — Z794 Long term (current) use of insulin: Secondary | ICD-10-CM

## 2015-07-22 DIAGNOSIS — L03116 Cellulitis of left lower limb: Secondary | ICD-10-CM | POA: Diagnosis not present

## 2015-07-22 DIAGNOSIS — S98911S Complete traumatic amputation of right foot, level unspecified, sequela: Secondary | ICD-10-CM | POA: Diagnosis not present

## 2015-07-22 DIAGNOSIS — E119 Type 2 diabetes mellitus without complications: Secondary | ICD-10-CM

## 2015-07-22 LAB — CBC
HEMATOCRIT: 42.4 % (ref 39.0–52.0)
Hemoglobin: 12.3 g/dL — ABNORMAL LOW (ref 13.0–17.0)
Platelets: 252 10*3/uL (ref 150.0–400.0)
RBC: 6.67 Mil/uL — AB (ref 4.22–5.81)
RDW: 23.1 % — ABNORMAL HIGH (ref 11.5–15.5)
WBC: 14.9 10*3/uL — ABNORMAL HIGH (ref 4.0–10.5)

## 2015-07-22 LAB — COMPREHENSIVE METABOLIC PANEL
ALT: 94 U/L — ABNORMAL HIGH (ref 0–53)
AST: 52 U/L — AB (ref 0–37)
Albumin: 4.1 g/dL (ref 3.5–5.2)
Alkaline Phosphatase: 94 U/L (ref 39–117)
BUN: 19 mg/dL (ref 6–23)
CHLORIDE: 79 meq/L — AB (ref 96–112)
CO2: 43 meq/L — AB (ref 19–32)
CREATININE: 1.07 mg/dL (ref 0.40–1.50)
Calcium: 10 mg/dL (ref 8.4–10.5)
GFR: 79.12 mL/min (ref >60.00)
GLUCOSE: 326 mg/dL — AB (ref 70–99)
POTASSIUM: 3 meq/L — AB (ref 3.5–5.1)
SODIUM: 133 meq/L — AB (ref 135–145)
Total Bilirubin: 0.6 mg/dL (ref 0.2–1.2)
Total Protein: 8.7 g/dL — ABNORMAL HIGH (ref 6.0–8.3)

## 2015-07-22 LAB — HEMOGLOBIN A1C: HEMOGLOBIN A1C: 8.8 % — AB (ref 4.6–6.5)

## 2015-07-22 MED ORDER — PEN NEEDLES 31G X 6 MM MISC: Status: AC

## 2015-07-22 MED ORDER — OXYCODONE HCL 5 MG PO TABS: 5.0000 mg | ORAL_TABLET | Freq: Two times a day (BID) | ORAL | Status: AC | PRN

## 2015-07-22 MED ORDER — INSULIN GLARGINE 300 UNIT/ML ~~LOC~~ SOPN: 25.0000 [IU] | PEN_INJECTOR | Freq: Every day | SUBCUTANEOUS | Status: AC

## 2015-07-22 NOTE — Progress Notes (Signed)
Pre visit review using our clinic review tool, if applicable. No additional management support is needed unless otherwise documented below in the visit note. 

## 2015-07-22 NOTE — Patient Instructions (Signed)
We are sending in an insulin called toujeo for the sugars. We have given you a sample today.   We would like you to start by taking 25 units at night time for the sugars. Check the sugars in the morning. Increase by 2 units every 2 days if the sugars are still >200 in the morning. So on Sunday night if the sugar in the morning is still >200 go up to 27 units, then in 2 days if still >200 go up to 29 units. You can keep increasing up to 50 units. If the sugars are still >200 in the morning call the office for advice.   If you notice low morning sugars <80 please go down on the insulin by 2 units then continue to monitor.   We are checking the labs today and have given you refill of the pain meds.

## 2015-07-24 DIAGNOSIS — L0390 Cellulitis, unspecified: Secondary | ICD-10-CM | POA: Insufficient documentation

## 2015-07-24 NOTE — Assessment & Plan Note (Signed)
Will increase his pain meds to #90 per month to address his pain as this is limiting his QOL. Needs UDS at next fill as previous inappropriate. Also talked about his using voltaren and it is true that he is at increased risk of kidney injury and we discussed this in detail. He and his wife agree for now we need to address his pain and once controlled we can readdress taking him off voltaren but for now will continue with close monitoring of renal function and if any decrease in function it will be stopped.

## 2015-07-24 NOTE — Progress Notes (Signed)
   Subjective:    Patient ID: Dean Bowers, male    DOB: 1970/05/14, 45 y.o.   MRN: [id number]  HPI The patient is a 45 YO man coming in for follow up of his medical problems including his diabetes (newly diagnosed and started on metformin and invokana for high sugars although HgA1c was 6.5, still checking sugars and having sugars in the 200-300 range since last visit, some cellulitis on his leg and several other infections since diagnosis of diabetes, has chronic pain but no neuropathy known, no recent eye exam), and his chronic pain (denies good control and taking his medication more often than he should, also has some concerns about his voltaren and his kidneys since he is in diuresis for his heart, he does feel that it helps with his pain and few things help, this is severely limiting his QOL) and his leg (new cellulitis and he is taking augmentin for it, stable to mildly improving and just started antibiotics).   Review of Systems  Constitutional: Positive for activity change and fatigue. Negative for fever, chills, appetite change and unexpected weight change.  Respiratory: Negative for cough, chest tightness, shortness of breath and wheezing.   Cardiovascular: Negative for chest pain and palpitations.  Gastrointestinal: Negative for nausea, abdominal pain, diarrhea, constipation and abdominal distention.  Endocrine: Positive for polydipsia and polyuria.  Musculoskeletal: Positive for back pain, arthralgias and gait problem. Negative for myalgias.  Skin: Positive for color change and rash.       tattoos  Neurological: Negative.       Objective:   Physical Exam  Constitutional: He appears well-developed and well-nourished.  Obese, with many tattoos  HENT:  Head: Normocephalic and atraumatic.  Eyes: EOM are normal.  Neck: Normal range of motion.  Cardiovascular: Normal rate and regular rhythm.   Pulmonary/Chest: Effort normal and breath sounds normal. No respiratory distress. He  has no wheezes. He has no rales. He exhibits no tenderness.  Abdominal: Soft. Bowel sounds are normal. He exhibits no distension. There is no tenderness. There is no rebound.  Musculoskeletal: He exhibits edema.  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 Left leg swollen to thigh  Neurological: Coordination normal.  Skin: Skin is warm and dry.  Left leg with increasing swelling and shiny with increased redness from prior, some tattoos in the area  Psychiatric: He has a normal mood and affect.   Filed Vitals:   07/22/15 1539  BP: 162/80  Pulse: 121  Temp: 98.6 F (37 C)  TempSrc: Oral  Resp: 20  Height: 5\' 10"  (1.778 m)  Weight: 389 lb (176.449 kg)  SpO2: 83%      Assessment & Plan:

## 2015-07-24 NOTE — Assessment & Plan Note (Signed)
His sugars are reported to be in the 200-300 range. Denies change to diet but diet is mediocre at best. Going on vacation this week starting day after visit. Given sample of toujeo and rx with instructions for starting at 25 units and titrating 2 units up every 2 days until sugars are around 110 in the morning. Checking HGA1c and if his home readings are accurate expect HgA1c to be much higher than previous. No recent blood transfusions to explain low HgA1c last time with very high sugars. He has had several infections which can elevate sugar readings.

## 2015-07-24 NOTE — Assessment & Plan Note (Signed)
Taking augmentin and could be the cause of some of his elevations in sugars. Talked to him about return precautions and to keep elevated. When going on trip this weekend he knows to stop every 2-3 hours to walk for about 10-15 minutes.

## 2015-07-28 ENCOUNTER — Encounter: Payer: Self-pay | Admitting: Internal Medicine

## 2015-07-31 ENCOUNTER — Encounter: Payer: Self-pay | Admitting: Adult Health

## 2015-08-03 ENCOUNTER — Telehealth: Payer: Self-pay | Admitting: Family Medicine

## 2015-08-03 NOTE — Telephone Encounter (Signed)
Sent sympathy card.

## 2015-08-04 ENCOUNTER — Ambulatory Visit: Payer: PRIVATE HEALTH INSURANCE | Admitting: Podiatry

## 2015-08-04 ENCOUNTER — Encounter (HOSPITAL_COMMUNITY): Payer: Medicare Other

## 2015-08-06 DEATH — deceased

## 2015-08-23 ENCOUNTER — Ambulatory Visit: Payer: Medicare Other | Admitting: Internal Medicine

## 2015-09-26 ENCOUNTER — Ambulatory Visit: Payer: Medicare Other | Admitting: Pulmonary Disease

## 2015-10-06 ENCOUNTER — Telehealth: Payer: Self-pay | Admitting: Family Medicine

## 2015-10-06 ENCOUNTER — Telehealth: Payer: Self-pay | Admitting: Internal Medicine
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
## 2015-10-06 NOTE — Telephone Encounter (Signed)
I cannot certify disability, that is a legal distinction. I can give my recommendation that he could not work at his previous job.

## 2015-10-06 NOTE — Telephone Encounter (Signed)
Take care of this. He apparently transferred to another doctor so check the record on how long I took care of him

## 2015-10-06 NOTE — Telephone Encounter (Signed)
Spouse is requesting a letter stating that patient was disabled for an insurance policy through patients employer.

## 2015-10-07 NOTE — Telephone Encounter (Signed)
Printed and signed for pickup.  

## 2015-10-07 NOTE — Telephone Encounter (Signed)
Faxed to patient's wife.

## 2015-10-07 NOTE — Telephone Encounter (Signed)
Dean Bowers said your recommendation would be fine. She said that is what she needs. Just something in your wording that he was not healthy enough to work while he was under your care.

## 2015-10-13 ENCOUNTER — Encounter: Payer: Self-pay | Admitting: Family Medicine

## 2015-10-13 NOTE — Telephone Encounter (Signed)
Letter was typed & signed by Dr. Redmond [employer] & faxed with diagnoses.  Wife informed

## 2016-08-12 IMAGING — CR DG CHEST 1V PORT
1 series · 1 of 1 positions shown · non-contrast
Comparison: 03/27/2014

CLINICAL DATA: Acute respiratory failure with hypoxemia

EXAM:
PORTABLE CHEST - 1 VIEW

[AP]
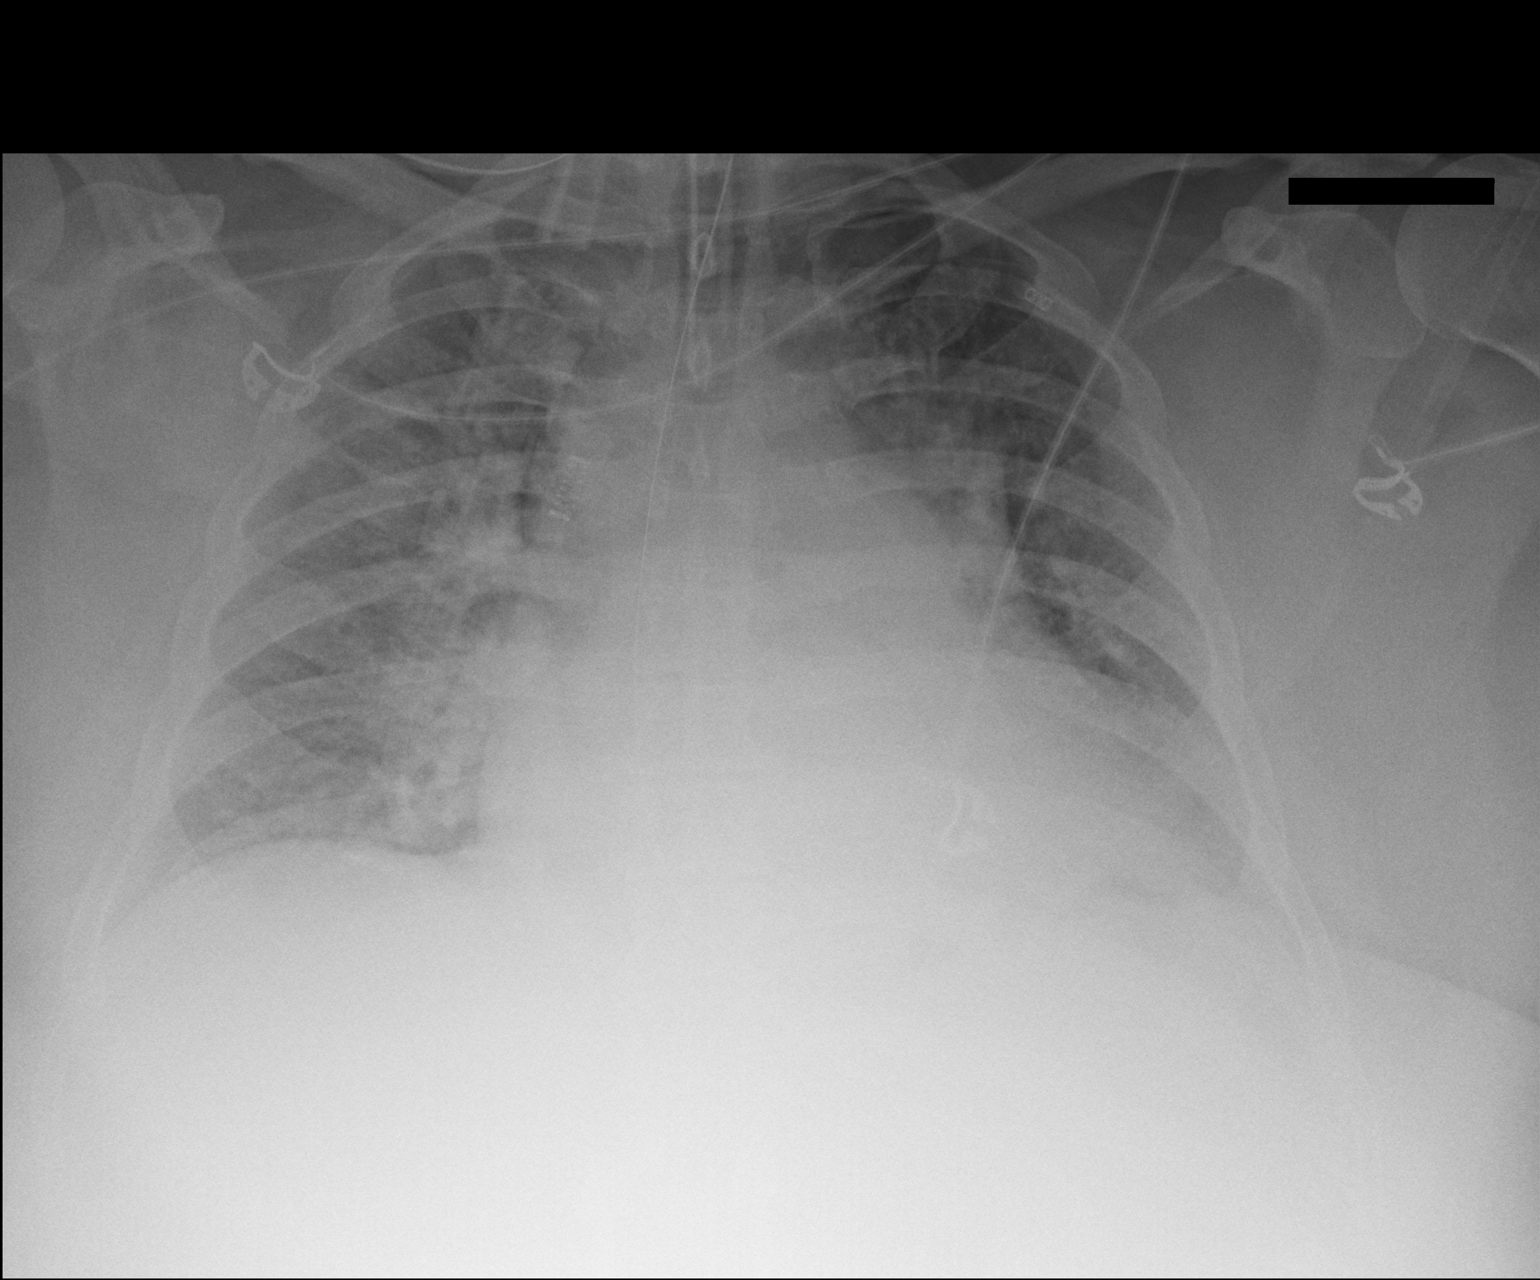

[1 of 1 positions shown; findings below may reference images not displayed]

FINDINGS: Endotracheal tube is below the clavicular heads. Nasogastric tube
extends into the stomach. Airspace opacities persist throughout both
lungs. No large effusions are evident. Unchanged cardiomegaly.
IMPRESSION: Persistent bilateral airspace opacities without significant interval
change.

## 2016-08-30 IMAGING — CR DG CHEST 1V PORT
1 series · 1 of 1 positions shown · non-contrast
Comparison: Portable exam 0690 hours compared to 03/28/2014

CLINICAL DATA: Lethargy, diaphoresis, renal failure, COPD,
respiratory failure, hypoxia, post BKA, diabetes, hypertension,
smoker

EXAM:
PORTABLE CHEST - 1 VIEW

[AP]
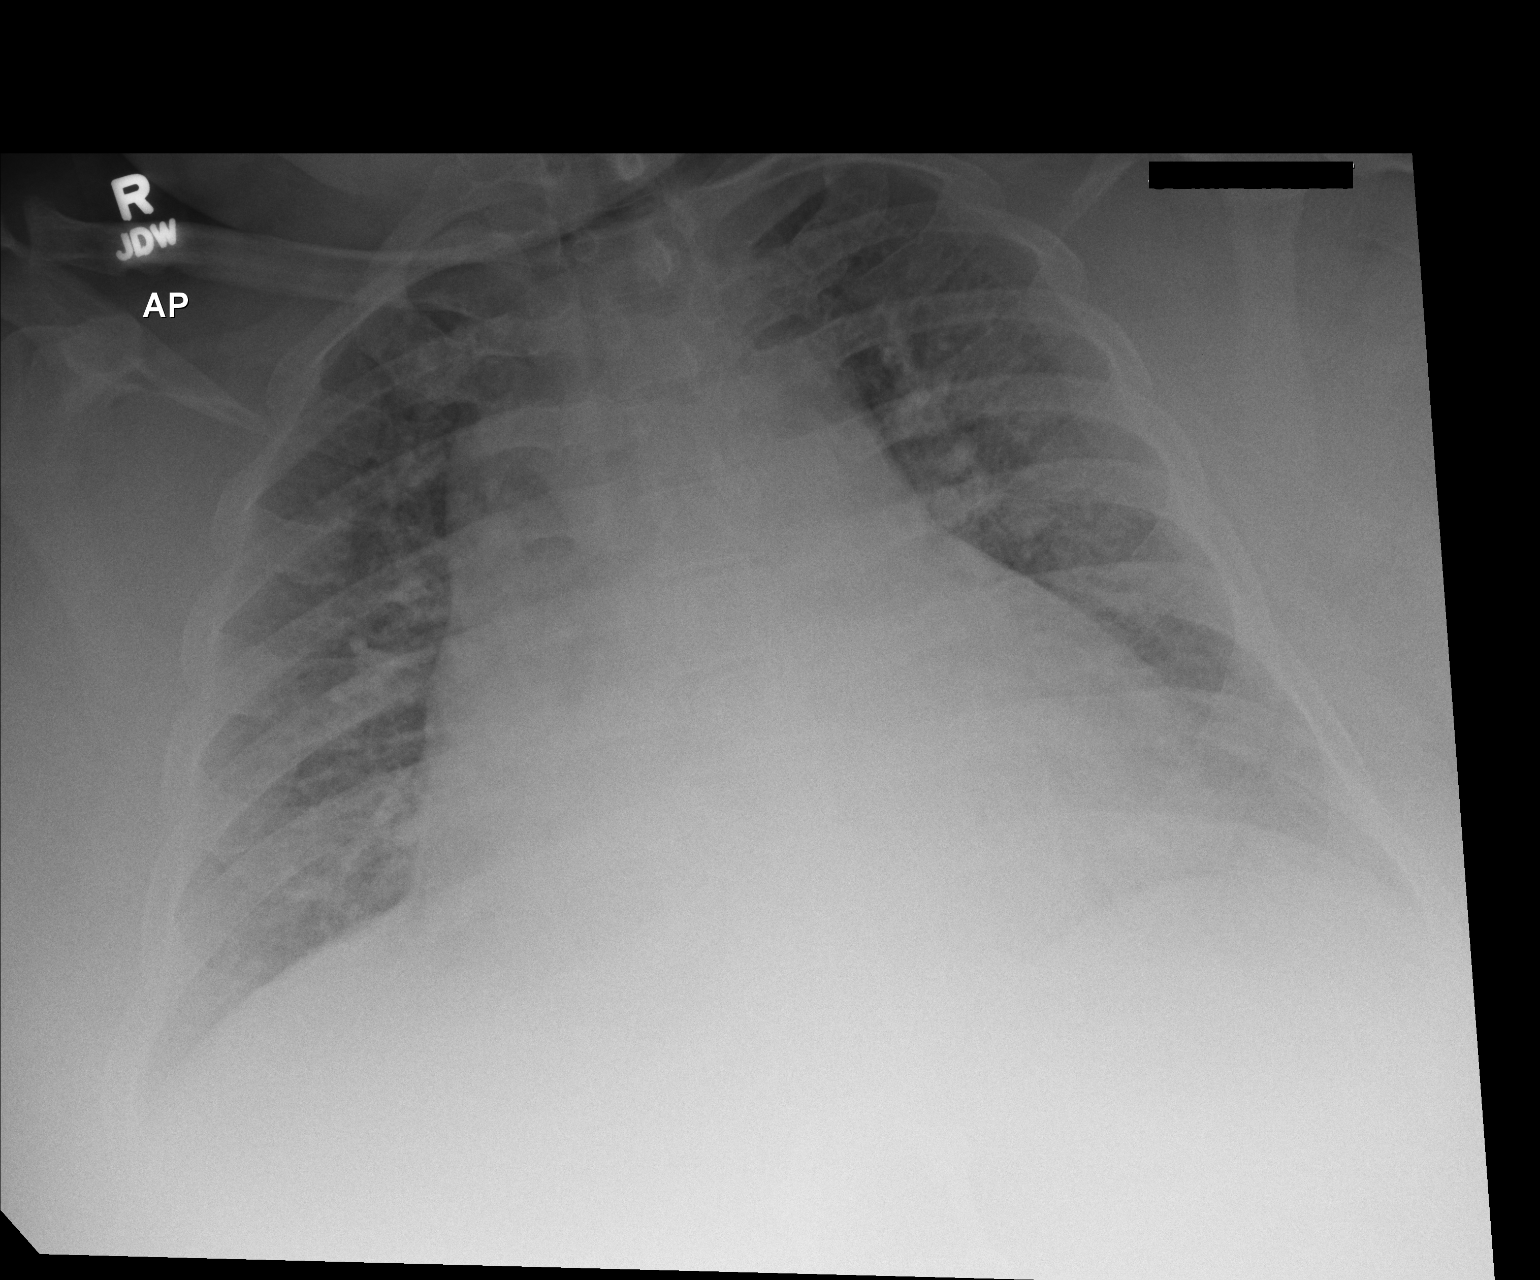

[1 of 1 positions shown; findings below may reference images not displayed]

FINDINGS: Enlargement of cardiac silhouette with pulmonary vascular
congestion.

Improved perihilar opacity since previous exam question improved
edema or infiltrate.

No pleural effusion or pneumothorax.

Bones unremarkable.
IMPRESSION: Enlargement of cardiac silhouette with pulmonary vascular
congestion.

Improved pulmonary infiltrates versus 03/28/2014.

## 2016-09-17 IMAGING — CR DG CHEST 2V
2 series · 3 of 3 positions shown · non-contrast
Comparison: 04/15/2014 and prior chest radiographs

CLINICAL DATA: 43-year-old male with acute shortness of breath.

EXAM:
CHEST  2 VIEW

[Series 1: chest lat · 0.14mm/px · 2 of 2 slices shown]
[im 1/2]
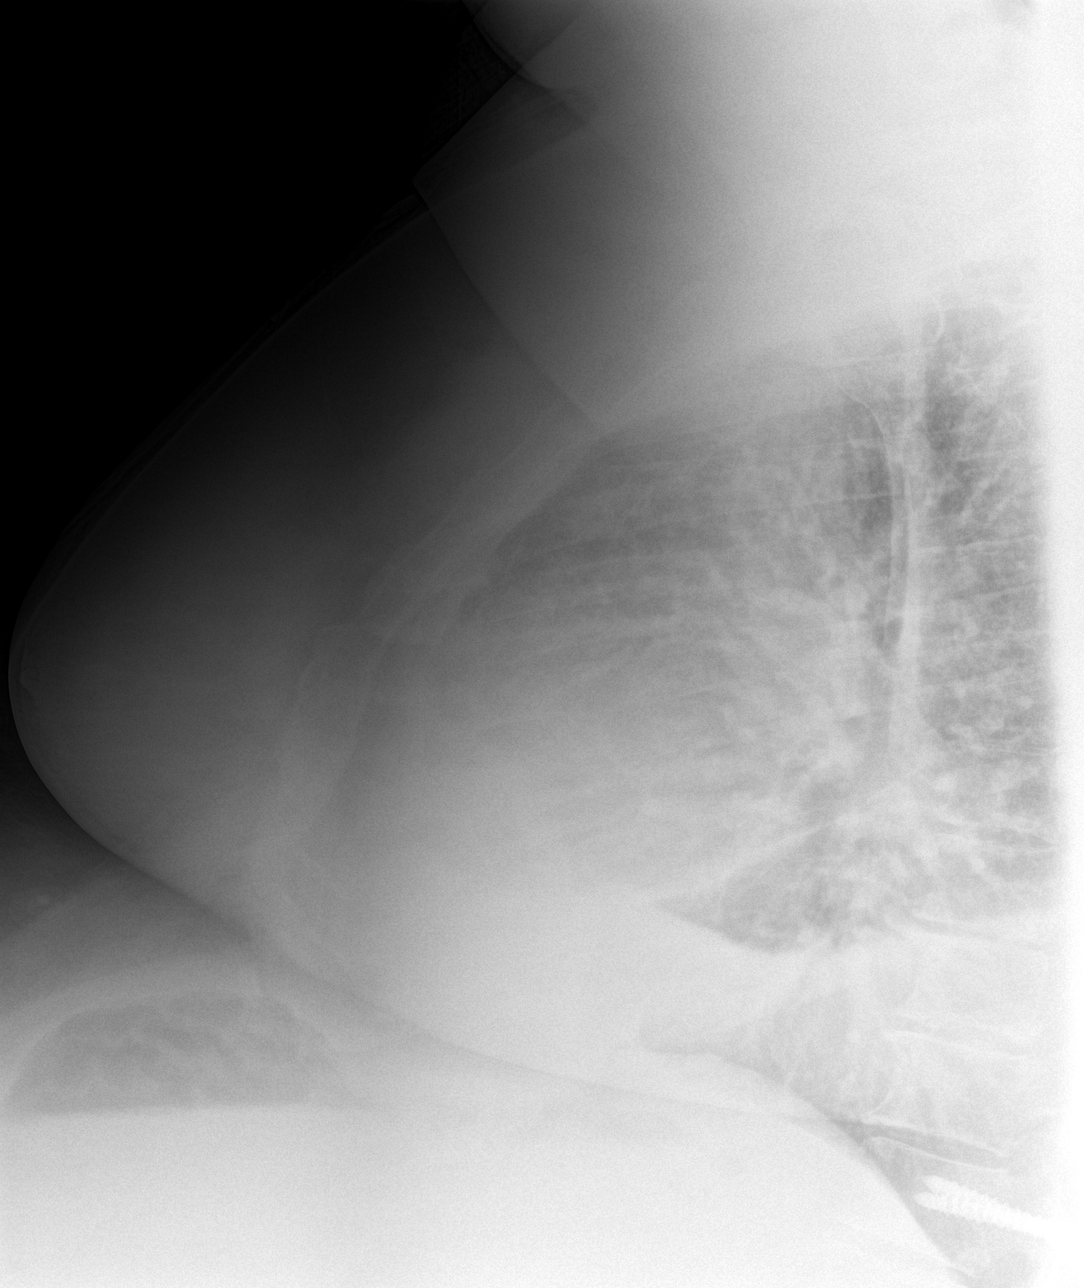
[im 2/2]
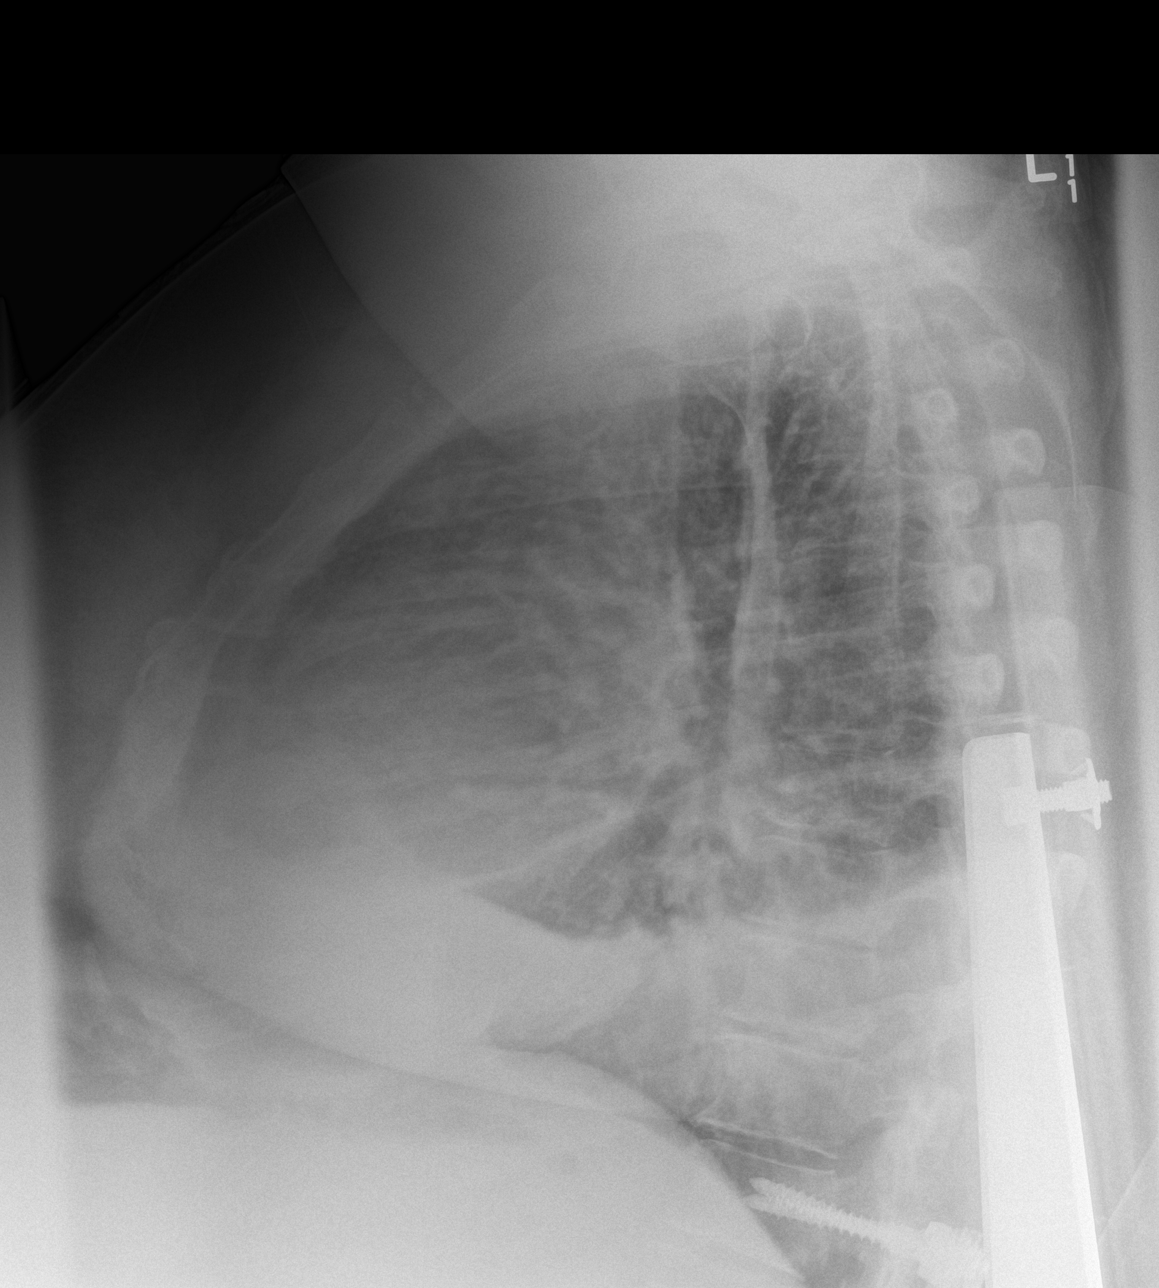

[chest ap]
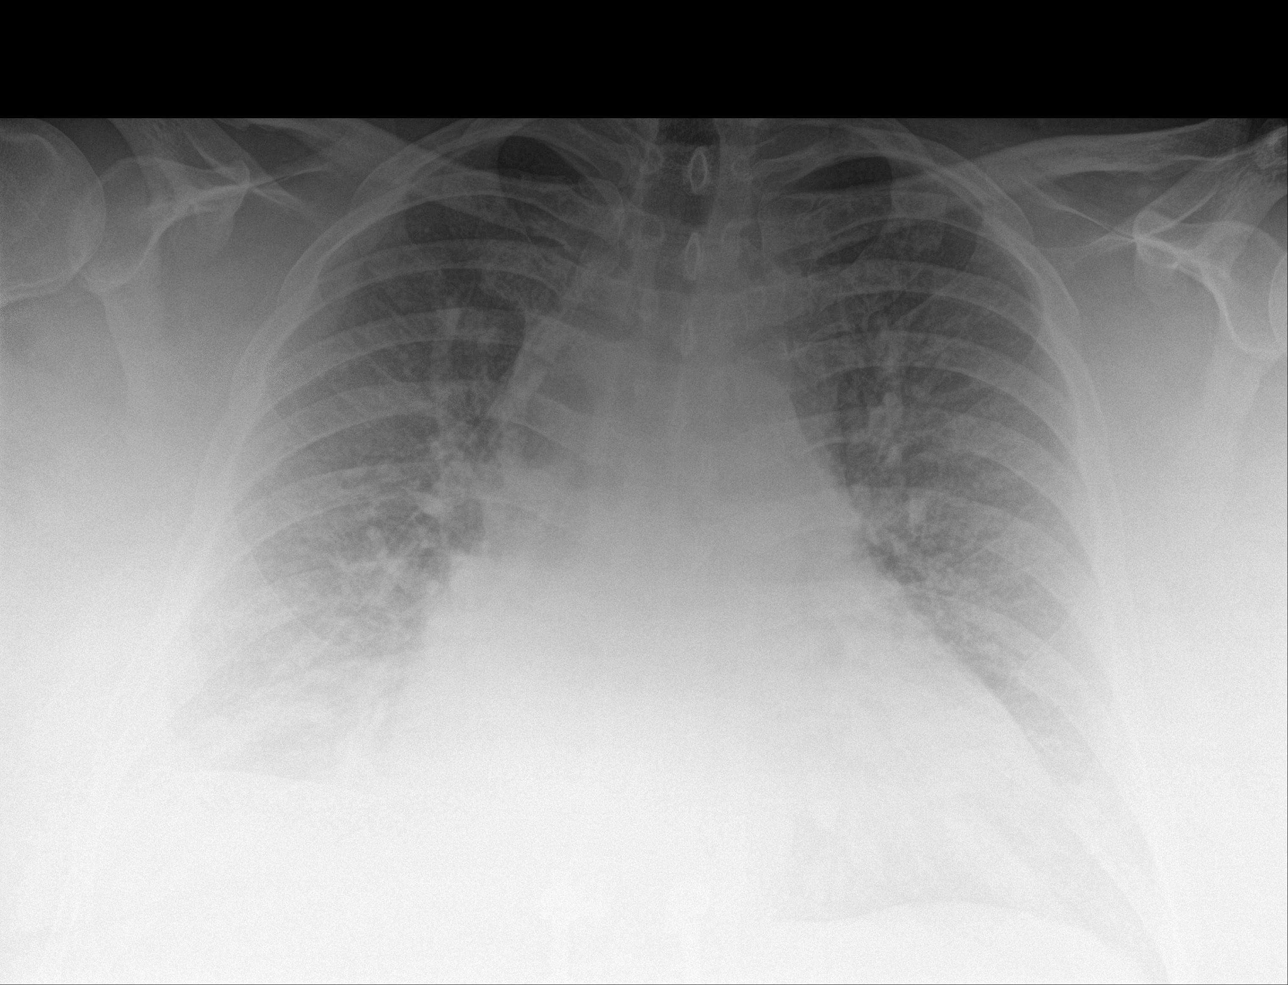

[3 of 3 positions shown; findings below may reference images not displayed]

FINDINGS: Cardiomegaly, pulmonary vascular congestion and interstitial
pulmonary edema noted.

There is no evidence of pneumothorax.

A small to moderate right pleural effusion is noted.

No acute bony abnormalities are identified.
IMPRESSION: Cardiomegaly with interstitial pulmonary edema and small to moderate
right pleural effusion.

## 2017-07-16 IMAGING — CR DG CHEST 1V PORT
1 series · 1 of 1 positions shown · non-contrast
Comparison: February 28, 2015

CLINICAL DATA: Hypoxia

EXAM:
PORTABLE CHEST 1 VIEW

[AP]
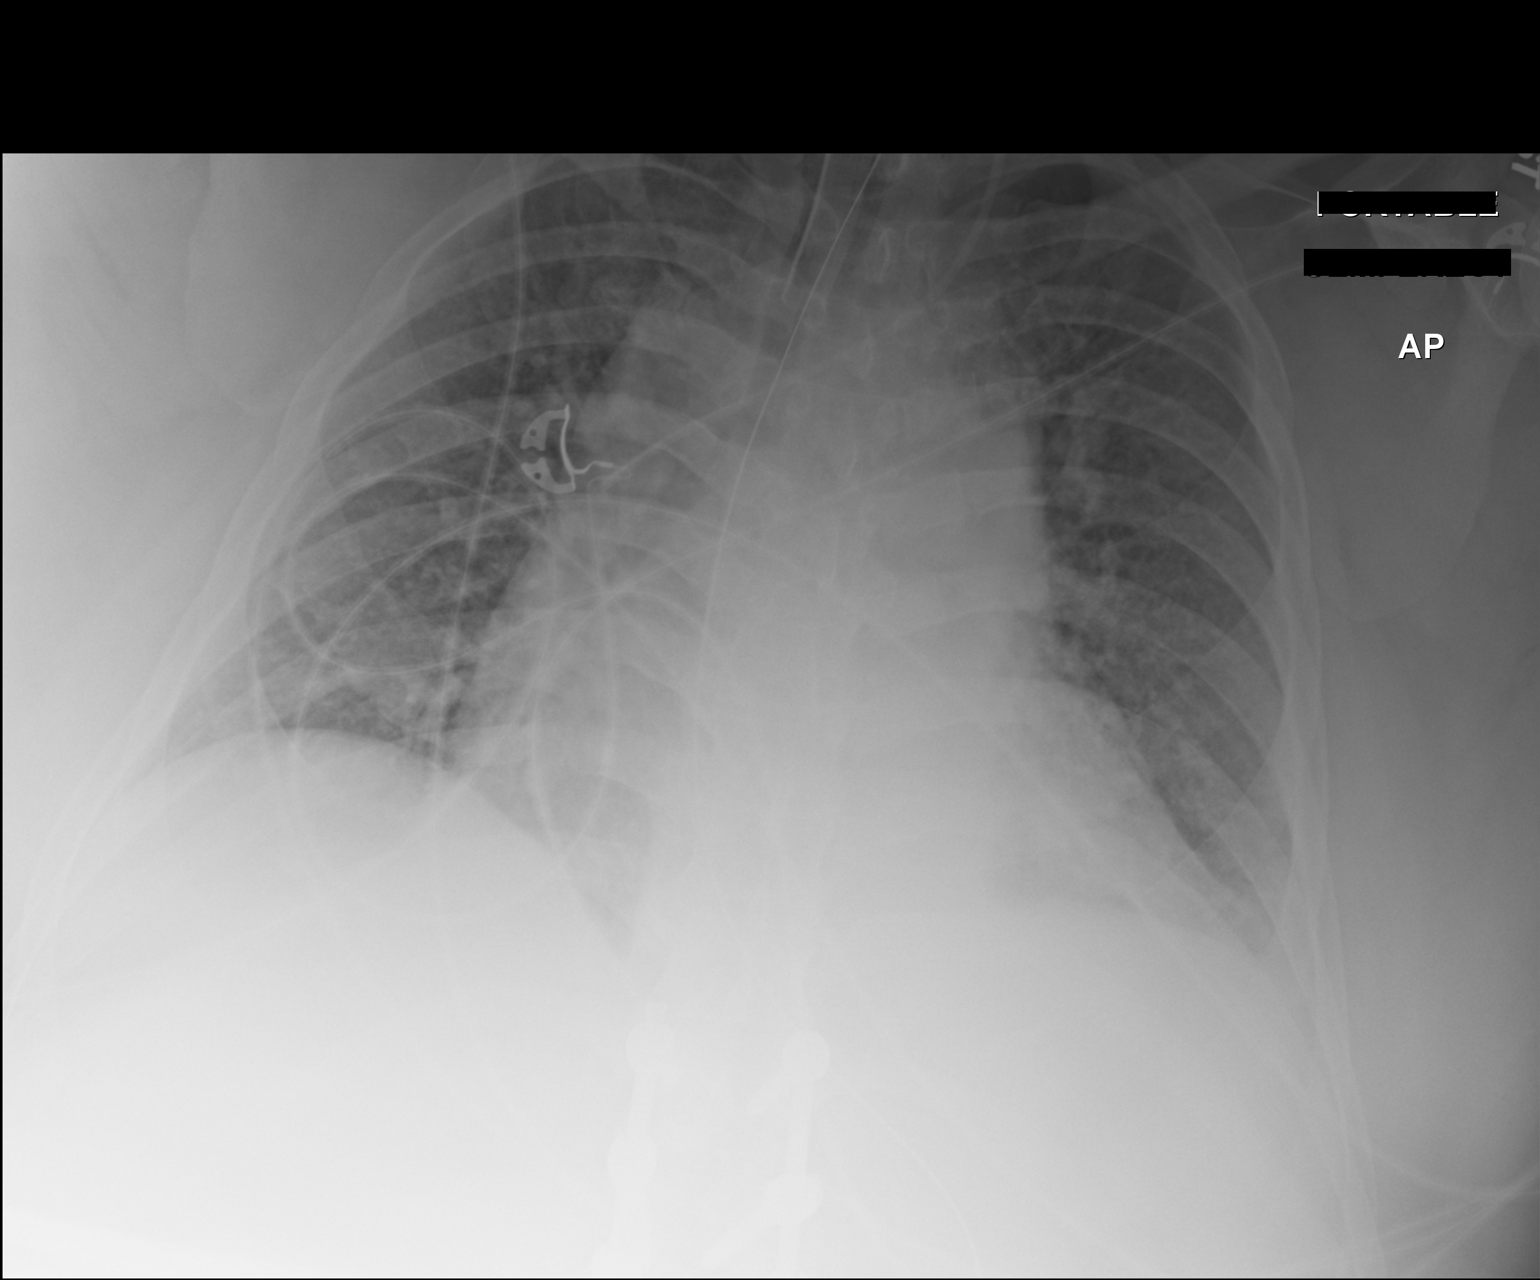

[1 of 1 positions shown; findings below may reference images not displayed]

FINDINGS: Endotracheal tube tip is 3.7 cm above the carina. Central catheter
tip is in the superior vena cava. Nasogastric tube tip and side port
below the diaphragm. No pneumothorax. There remains mild generalized
interstitial edema. No airspace consolidation. There is stable
cardiomegaly. There is mild pulmonary venous hypertension. No
adenopathy.
IMPRESSION: Evidence of a degree of congestive heart failure, unchanged. Tube
and catheter positions as described without pneumothorax. No
airspace consolidation appreciable.
